# Patient Record
Sex: Female | Born: 1960 | Race: White | Hispanic: No | Smoking: Former smoker
Health system: Southern US, Community
[De-identification: ages and names within clinical notes are randomized; demographics above are authoritative.]

## PROBLEM LIST (undated history)

## (undated) DIAGNOSIS — I10 Essential (primary) hypertension: Secondary | ICD-10-CM

## (undated) DIAGNOSIS — A4902 Methicillin resistant Staphylococcus aureus infection, unspecified site: Secondary | ICD-10-CM

## (undated) DIAGNOSIS — G8929 Other chronic pain: Secondary | ICD-10-CM

## (undated) DIAGNOSIS — E785 Hyperlipidemia, unspecified: Secondary | ICD-10-CM

## (undated) DIAGNOSIS — E7800 Pure hypercholesterolemia, unspecified: Secondary | ICD-10-CM

## (undated) DIAGNOSIS — F1210 Cannabis abuse, uncomplicated: Secondary | ICD-10-CM

## (undated) DIAGNOSIS — L409 Psoriasis, unspecified: Secondary | ICD-10-CM

## (undated) DIAGNOSIS — Z720 Tobacco use: Secondary | ICD-10-CM

## (undated) DIAGNOSIS — R413 Other amnesia: Secondary | ICD-10-CM

## (undated) DIAGNOSIS — M79673 Pain in unspecified foot: Secondary | ICD-10-CM

## (undated) DIAGNOSIS — M62830 Muscle spasm of back: Secondary | ICD-10-CM

## (undated) DIAGNOSIS — M549 Dorsalgia, unspecified: Secondary | ICD-10-CM

## (undated) DIAGNOSIS — J449 Chronic obstructive pulmonary disease, unspecified: Secondary | ICD-10-CM

## (undated) DIAGNOSIS — R918 Other nonspecific abnormal finding of lung field: Secondary | ICD-10-CM

## (undated) DIAGNOSIS — E119 Type 2 diabetes mellitus without complications: Secondary | ICD-10-CM

## (undated) DIAGNOSIS — I639 Cerebral infarction, unspecified: Secondary | ICD-10-CM

## (undated) DIAGNOSIS — C801 Malignant (primary) neoplasm, unspecified: Secondary | ICD-10-CM

## (undated) HISTORY — DX: Hyperlipidemia, unspecified: E78.5

## (undated) HISTORY — PX: ABDOMINAL HYSTERECTOMY: SHX81

## (undated) HISTORY — DX: Cannabis abuse, uncomplicated: F12.10

## (undated) HISTORY — DX: Psoriasis, unspecified: L40.9

## (undated) HISTORY — DX: Pure hypercholesterolemia, unspecified: E78.00

## (undated) HISTORY — DX: Methicillin resistant Staphylococcus aureus infection, unspecified site: A49.02

## (undated) HISTORY — DX: Chronic obstructive pulmonary disease, unspecified: J44.9

## (undated) HISTORY — PX: OTHER SURGICAL HISTORY: SHX169

## (undated) HISTORY — DX: Cerebral infarction, unspecified: I63.9

## (undated) HISTORY — DX: Muscle spasm of back: M62.830

## (undated) HISTORY — DX: Type 2 diabetes mellitus without complications: E11.9

## (undated) HISTORY — PX: MASTECTOMY: SHX3

## (undated) HISTORY — DX: Tobacco use: Z72.0

---

## 2000-08-25 ENCOUNTER — Emergency Department (HOSPITAL_COMMUNITY): Admission: EM | Admit: 2000-08-25 | Discharge: 2000-08-25 | Payer: Self-pay | Admitting: Emergency Medicine

## 2000-09-22 ENCOUNTER — Emergency Department (HOSPITAL_COMMUNITY): Admission: EM | Admit: 2000-09-22 | Discharge: 2000-09-22 | Payer: Self-pay | Admitting: *Deleted

## 2000-10-21 ENCOUNTER — Emergency Department (HOSPITAL_COMMUNITY): Admission: EM | Admit: 2000-10-21 | Discharge: 2000-10-21 | Payer: Self-pay | Admitting: Emergency Medicine

## 2000-11-17 ENCOUNTER — Emergency Department (HOSPITAL_COMMUNITY): Admission: EM | Admit: 2000-11-17 | Discharge: 2000-11-17 | Payer: Self-pay | Admitting: Emergency Medicine

## 2001-07-11 ENCOUNTER — Emergency Department (HOSPITAL_COMMUNITY): Admission: EM | Admit: 2001-07-11 | Discharge: 2001-07-11 | Payer: Self-pay | Admitting: Emergency Medicine

## 2001-08-05 ENCOUNTER — Emergency Department (HOSPITAL_COMMUNITY): Admission: EM | Admit: 2001-08-05 | Discharge: 2001-08-05 | Payer: Self-pay | Admitting: Emergency Medicine

## 2001-10-22 ENCOUNTER — Emergency Department (HOSPITAL_COMMUNITY): Admission: EM | Admit: 2001-10-22 | Discharge: 2001-10-22 | Payer: Self-pay | Admitting: *Deleted

## 2001-10-22 ENCOUNTER — Emergency Department (HOSPITAL_COMMUNITY): Admission: EM | Admit: 2001-10-22 | Discharge: 2001-10-22 | Payer: Self-pay | Admitting: Emergency Medicine

## 2002-06-05 ENCOUNTER — Emergency Department (HOSPITAL_COMMUNITY): Admission: EM | Admit: 2002-06-05 | Discharge: 2002-06-05 | Payer: Self-pay | Admitting: Internal Medicine

## 2002-10-01 ENCOUNTER — Emergency Department (HOSPITAL_COMMUNITY): Admission: EM | Admit: 2002-10-01 | Discharge: 2002-10-01 | Payer: Self-pay | Admitting: Emergency Medicine

## 2002-12-17 ENCOUNTER — Emergency Department (HOSPITAL_COMMUNITY): Admission: EM | Admit: 2002-12-17 | Discharge: 2002-12-17 | Payer: Self-pay | Admitting: Emergency Medicine

## 2003-01-09 ENCOUNTER — Emergency Department (HOSPITAL_COMMUNITY): Admission: EM | Admit: 2003-01-09 | Discharge: 2003-01-09 | Payer: Self-pay | Admitting: Emergency Medicine

## 2003-07-29 ENCOUNTER — Emergency Department (HOSPITAL_COMMUNITY): Admission: EM | Admit: 2003-07-29 | Discharge: 2003-07-29 | Payer: Self-pay | Admitting: Emergency Medicine

## 2003-09-20 ENCOUNTER — Emergency Department (HOSPITAL_COMMUNITY): Admission: EM | Admit: 2003-09-20 | Discharge: 2003-09-20 | Payer: Self-pay | Admitting: *Deleted

## 2003-09-24 ENCOUNTER — Emergency Department (HOSPITAL_COMMUNITY): Admission: EM | Admit: 2003-09-24 | Discharge: 2003-09-24 | Payer: Self-pay | Admitting: *Deleted

## 2004-01-07 ENCOUNTER — Ambulatory Visit (HOSPITAL_COMMUNITY): Admission: RE | Admit: 2004-01-07 | Discharge: 2004-01-07 | Payer: Self-pay | Admitting: General Surgery

## 2004-03-02 ENCOUNTER — Inpatient Hospital Stay (HOSPITAL_COMMUNITY): Admission: RE | Admit: 2004-03-02 | Discharge: 2004-03-04 | Payer: Self-pay | Admitting: General Surgery

## 2004-03-07 ENCOUNTER — Inpatient Hospital Stay (HOSPITAL_COMMUNITY): Admission: EM | Admit: 2004-03-07 | Discharge: 2004-03-12 | Payer: Self-pay | Admitting: Emergency Medicine

## 2004-03-17 ENCOUNTER — Emergency Department (HOSPITAL_COMMUNITY): Admission: EM | Admit: 2004-03-17 | Discharge: 2004-03-17 | Payer: Self-pay | Admitting: Emergency Medicine

## 2004-05-11 ENCOUNTER — Emergency Department (HOSPITAL_COMMUNITY): Admission: EM | Admit: 2004-05-11 | Discharge: 2004-05-11 | Payer: Self-pay | Admitting: Emergency Medicine

## 2004-08-17 ENCOUNTER — Emergency Department (HOSPITAL_COMMUNITY): Admission: EM | Admit: 2004-08-17 | Discharge: 2004-08-17 | Payer: Self-pay | Admitting: *Deleted

## 2004-10-20 ENCOUNTER — Emergency Department (HOSPITAL_COMMUNITY): Admission: EM | Admit: 2004-10-20 | Discharge: 2004-10-20 | Payer: Self-pay | Admitting: Emergency Medicine

## 2005-01-20 ENCOUNTER — Emergency Department (HOSPITAL_COMMUNITY): Admission: EM | Admit: 2005-01-20 | Discharge: 2005-01-20 | Payer: Self-pay | Admitting: Emergency Medicine

## 2005-03-25 ENCOUNTER — Inpatient Hospital Stay (HOSPITAL_COMMUNITY): Admission: RE | Admit: 2005-03-25 | Discharge: 2005-03-28 | Payer: Self-pay | Admitting: General Surgery

## 2005-03-25 ENCOUNTER — Encounter (INDEPENDENT_AMBULATORY_CARE_PROVIDER_SITE_OTHER): Payer: Self-pay | Admitting: General Surgery

## 2005-04-25 ENCOUNTER — Emergency Department (HOSPITAL_COMMUNITY): Admission: EM | Admit: 2005-04-25 | Discharge: 2005-04-25 | Payer: Self-pay | Admitting: Emergency Medicine

## 2005-11-28 ENCOUNTER — Emergency Department (HOSPITAL_COMMUNITY): Admission: EM | Admit: 2005-11-28 | Discharge: 2005-11-28 | Payer: Self-pay | Admitting: Emergency Medicine

## 2006-08-23 ENCOUNTER — Emergency Department (HOSPITAL_COMMUNITY): Admission: EM | Admit: 2006-08-23 | Discharge: 2006-08-23 | Payer: Self-pay | Admitting: Emergency Medicine

## 2007-02-22 HISTORY — PX: MASTECTOMY: SHX3

## 2007-05-23 ENCOUNTER — Emergency Department (HOSPITAL_COMMUNITY): Admission: EM | Admit: 2007-05-23 | Discharge: 2007-05-23 | Payer: Self-pay | Admitting: Emergency Medicine

## 2007-06-11 ENCOUNTER — Ambulatory Visit (HOSPITAL_COMMUNITY): Admission: RE | Admit: 2007-06-11 | Discharge: 2007-06-11 | Payer: Self-pay | Admitting: Family Medicine

## 2007-06-18 ENCOUNTER — Ambulatory Visit (HOSPITAL_COMMUNITY): Admission: RE | Admit: 2007-06-18 | Discharge: 2007-06-18 | Payer: Self-pay | Admitting: Family Medicine

## 2007-06-18 ENCOUNTER — Encounter (INDEPENDENT_AMBULATORY_CARE_PROVIDER_SITE_OTHER): Payer: Self-pay | Admitting: Diagnostic Radiology

## 2007-06-28 ENCOUNTER — Ambulatory Visit (HOSPITAL_COMMUNITY): Admission: RE | Admit: 2007-06-28 | Discharge: 2007-06-28 | Payer: Self-pay | Admitting: General Surgery

## 2007-07-06 ENCOUNTER — Ambulatory Visit (HOSPITAL_COMMUNITY): Admission: RE | Admit: 2007-07-06 | Discharge: 2007-07-06 | Payer: Self-pay | Admitting: General Surgery

## 2007-07-08 ENCOUNTER — Emergency Department (HOSPITAL_COMMUNITY): Admission: EM | Admit: 2007-07-08 | Discharge: 2007-07-08 | Payer: Self-pay | Admitting: Emergency Medicine

## 2007-07-09 ENCOUNTER — Ambulatory Visit (HOSPITAL_COMMUNITY): Payer: Self-pay | Admitting: Oncology

## 2007-07-09 ENCOUNTER — Encounter (HOSPITAL_COMMUNITY): Admission: RE | Admit: 2007-07-09 | Discharge: 2007-08-08 | Payer: Self-pay | Admitting: Oncology

## 2007-07-12 ENCOUNTER — Ambulatory Visit: Payer: Self-pay | Admitting: Cardiovascular Disease

## 2007-07-12 ENCOUNTER — Encounter (HOSPITAL_COMMUNITY): Payer: Self-pay | Admitting: Oncology

## 2007-07-13 ENCOUNTER — Ambulatory Visit (HOSPITAL_COMMUNITY): Admission: RE | Admit: 2007-07-13 | Discharge: 2007-07-13 | Payer: Self-pay | Admitting: Urology

## 2007-08-20 ENCOUNTER — Encounter (HOSPITAL_COMMUNITY): Admission: RE | Admit: 2007-08-20 | Discharge: 2007-09-19 | Payer: Self-pay | Admitting: Oncology

## 2007-09-04 ENCOUNTER — Ambulatory Visit (HOSPITAL_COMMUNITY): Payer: Self-pay | Admitting: Oncology

## 2007-10-02 ENCOUNTER — Encounter (HOSPITAL_COMMUNITY): Admission: RE | Admit: 2007-10-02 | Discharge: 2007-11-01 | Payer: Self-pay | Admitting: Oncology

## 2007-10-12 ENCOUNTER — Encounter (INDEPENDENT_AMBULATORY_CARE_PROVIDER_SITE_OTHER): Payer: Self-pay | Admitting: General Surgery

## 2007-10-12 ENCOUNTER — Inpatient Hospital Stay (HOSPITAL_COMMUNITY): Admission: RE | Admit: 2007-10-12 | Discharge: 2007-10-14 | Payer: Self-pay | Admitting: General Surgery

## 2007-10-20 ENCOUNTER — Emergency Department (HOSPITAL_COMMUNITY): Admission: EM | Admit: 2007-10-20 | Discharge: 2007-10-20 | Payer: Self-pay | Admitting: Emergency Medicine

## 2007-10-23 ENCOUNTER — Ambulatory Visit (HOSPITAL_COMMUNITY): Payer: Self-pay | Admitting: Oncology

## 2007-10-23 ENCOUNTER — Inpatient Hospital Stay (HOSPITAL_COMMUNITY): Admission: AD | Admit: 2007-10-23 | Discharge: 2007-10-26 | Payer: Self-pay | Admitting: General Surgery

## 2007-11-07 ENCOUNTER — Encounter (HOSPITAL_COMMUNITY): Admission: RE | Admit: 2007-11-07 | Discharge: 2007-12-07 | Payer: Self-pay | Admitting: Oncology

## 2007-12-04 ENCOUNTER — Emergency Department (HOSPITAL_COMMUNITY): Admission: EM | Admit: 2007-12-04 | Discharge: 2007-12-04 | Payer: Self-pay | Admitting: Emergency Medicine

## 2007-12-17 ENCOUNTER — Encounter (HOSPITAL_COMMUNITY): Admission: RE | Admit: 2007-12-17 | Discharge: 2008-01-16 | Payer: Self-pay | Admitting: Oncology

## 2007-12-17 ENCOUNTER — Ambulatory Visit (HOSPITAL_COMMUNITY): Payer: Self-pay | Admitting: Oncology

## 2007-12-26 ENCOUNTER — Other Ambulatory Visit (HOSPITAL_COMMUNITY): Payer: Self-pay | Admitting: Oncology

## 2008-01-30 ENCOUNTER — Encounter (HOSPITAL_COMMUNITY): Admission: RE | Admit: 2008-01-30 | Discharge: 2008-02-29 | Payer: Self-pay | Admitting: Oncology

## 2008-02-06 ENCOUNTER — Ambulatory Visit (HOSPITAL_COMMUNITY): Payer: Self-pay | Admitting: Oncology

## 2008-03-13 ENCOUNTER — Encounter (HOSPITAL_COMMUNITY): Admission: RE | Admit: 2008-03-13 | Discharge: 2008-04-12 | Payer: Self-pay | Admitting: Oncology

## 2008-04-04 ENCOUNTER — Ambulatory Visit (HOSPITAL_COMMUNITY): Payer: Self-pay | Admitting: Oncology

## 2008-04-18 ENCOUNTER — Encounter (HOSPITAL_COMMUNITY): Admission: RE | Admit: 2008-04-18 | Discharge: 2008-05-18 | Payer: Self-pay | Admitting: Oncology

## 2008-04-21 ENCOUNTER — Ambulatory Visit: Admission: RE | Admit: 2008-04-21 | Discharge: 2008-07-20 | Payer: Self-pay | Admitting: Radiation Oncology

## 2008-05-19 ENCOUNTER — Ambulatory Visit (HOSPITAL_COMMUNITY): Admission: RE | Admit: 2008-05-19 | Discharge: 2008-05-19 | Payer: Self-pay | Admitting: Radiation Oncology

## 2008-11-13 ENCOUNTER — Ambulatory Visit (HOSPITAL_COMMUNITY): Payer: Self-pay | Admitting: Internal Medicine

## 2008-11-18 ENCOUNTER — Ambulatory Visit: Admission: RE | Admit: 2008-11-18 | Discharge: 2008-11-20 | Payer: Self-pay | Admitting: Radiation Oncology

## 2008-12-09 ENCOUNTER — Emergency Department (HOSPITAL_COMMUNITY): Admission: EM | Admit: 2008-12-09 | Discharge: 2008-12-09 | Payer: Self-pay | Admitting: Emergency Medicine

## 2009-03-13 ENCOUNTER — Ambulatory Visit (HOSPITAL_COMMUNITY): Payer: Self-pay | Admitting: Oncology

## 2009-03-13 ENCOUNTER — Encounter (HOSPITAL_COMMUNITY): Admission: RE | Admit: 2009-03-13 | Discharge: 2009-04-12 | Payer: Self-pay | Admitting: Oncology

## 2009-04-25 ENCOUNTER — Emergency Department (HOSPITAL_COMMUNITY): Admission: EM | Admit: 2009-04-25 | Discharge: 2009-04-25 | Payer: Self-pay | Admitting: Emergency Medicine

## 2009-05-13 ENCOUNTER — Ambulatory Visit: Payer: Self-pay | Admitting: Physician Assistant

## 2009-05-13 DIAGNOSIS — F411 Generalized anxiety disorder: Secondary | ICD-10-CM | POA: Insufficient documentation

## 2009-05-13 DIAGNOSIS — F329 Major depressive disorder, single episode, unspecified: Secondary | ICD-10-CM

## 2009-05-13 DIAGNOSIS — I10 Essential (primary) hypertension: Secondary | ICD-10-CM | POA: Insufficient documentation

## 2009-05-13 DIAGNOSIS — B353 Tinea pedis: Secondary | ICD-10-CM

## 2009-05-13 DIAGNOSIS — C50919 Malignant neoplasm of unspecified site of unspecified female breast: Secondary | ICD-10-CM | POA: Insufficient documentation

## 2009-05-13 LAB — CONVERTED CEMR LAB
ALT: 20 units/L (ref 0–35)
AST: 19 units/L (ref 0–37)
Albumin: 4.5 g/dL (ref 3.5–5.2)
BUN: 12 mg/dL (ref 6–23)
Barbiturate Quant, Ur: NEGATIVE
Benzodiazepines.: POSITIVE — AB
Calcium: 10 mg/dL (ref 8.4–10.5)
Chloride: 104 meq/L (ref 96–112)
Eosinophils Relative: 2 % (ref 0–5)
HCT: 43.8 % (ref 36.0–46.0)
Hemoglobin: 14.3 g/dL (ref 12.0–15.0)
Lymphocytes Relative: 36 % (ref 12–46)
Lymphs Abs: 2.1 10*3/uL (ref 0.7–4.0)
Marijuana Metabolite: NEGATIVE
Methadone: NEGATIVE
Monocytes Absolute: 0.4 10*3/uL (ref 0.1–1.0)
Opiate Screen, Urine: NEGATIVE
Platelets: 280 10*3/uL (ref 150–400)
Potassium: 4.4 meq/L (ref 3.5–5.3)
Propoxyphene: NEGATIVE
Sodium: 142 meq/L (ref 135–145)
Total Protein: 7.7 g/dL (ref 6.0–8.3)
WBC: 5.9 10*3/uL (ref 4.0–10.5)

## 2009-05-14 ENCOUNTER — Encounter: Payer: Self-pay | Admitting: Physician Assistant

## 2009-09-18 ENCOUNTER — Ambulatory Visit (HOSPITAL_COMMUNITY): Payer: Self-pay | Admitting: Oncology

## 2009-11-04 ENCOUNTER — Encounter (HOSPITAL_COMMUNITY)
Admission: RE | Admit: 2009-11-04 | Discharge: 2009-12-04 | Payer: Self-pay | Source: Home / Self Care | Admitting: Oncology

## 2010-02-25 ENCOUNTER — Ambulatory Visit: Admit: 2010-02-25 | Payer: Self-pay | Admitting: Nurse Practitioner

## 2010-03-13 ENCOUNTER — Encounter: Payer: Self-pay | Admitting: General Surgery

## 2010-03-14 ENCOUNTER — Encounter: Payer: Self-pay | Admitting: General Surgery

## 2010-03-15 ENCOUNTER — Encounter: Payer: Self-pay | Admitting: Radiation Oncology

## 2010-03-19 ENCOUNTER — Encounter (HOSPITAL_COMMUNITY)
Admission: RE | Admit: 2010-03-19 | Discharge: 2010-03-23 | Payer: Self-pay | Source: Home / Self Care | Attending: Oncology | Admitting: Oncology

## 2010-03-19 ENCOUNTER — Ambulatory Visit (HOSPITAL_COMMUNITY)
Admission: RE | Admit: 2010-03-19 | Discharge: 2010-03-23 | Payer: Self-pay | Source: Home / Self Care | Attending: Oncology | Admitting: Oncology

## 2010-03-19 LAB — COMPREHENSIVE METABOLIC PANEL
ALT: 20 U/L (ref 0–35)
AST: 19 U/L (ref 0–37)
Albumin: 3.8 g/dL (ref 3.5–5.2)
Alkaline Phosphatase: 78 U/L (ref 39–117)
BUN: 8 mg/dL (ref 6–23)
CO2: 29 mEq/L (ref 19–32)
Calcium: 9.6 mg/dL (ref 8.4–10.5)
Chloride: 98 mEq/L (ref 96–112)
Creatinine, Ser: 0.73 mg/dL (ref 0.4–1.2)
GFR calc Af Amer: >60 mL/min (ref >60)
GFR calc non Af Amer: >60 mL/min (ref >60)
Glucose, Bld: 134 mg/dL — ABNORMAL HIGH (ref 70–99)
Potassium: 3.4 mEq/L — ABNORMAL LOW (ref 3.5–5.1)
Sodium: 137 mEq/L (ref 135–145)
Total Bilirubin: 0.2 mg/dL — ABNORMAL LOW (ref 0.3–1.2)
Total Protein: 7.6 g/dL (ref 6.0–8.3)

## 2010-03-19 LAB — CBC
MCH: 32.6 pg (ref 26.0–34.0)
MCV: 94 fL (ref 78.0–100.0)
Platelets: 197 10*3/uL (ref 150–400)
RDW: 13.9 % (ref 11.5–15.5)

## 2010-03-19 LAB — DIFFERENTIAL
Basophils Absolute: 0 10*3/uL (ref 0.0–0.1)
Basophils Relative: 0 % (ref 0–1)
Eosinophils Absolute: 0.1 10*3/uL (ref 0.0–0.7)
Eosinophils Relative: 1 % (ref 0–5)
Lymphocytes Relative: 23 % (ref 12–46)
Lymphs Abs: 1.9 10*3/uL (ref 0.7–4.0)
Monocytes Absolute: 0.6 10*3/uL (ref 0.1–1.0)
Monocytes Relative: 7 % (ref 3–12)
Neutro Abs: 5.8 10*3/uL (ref 1.7–7.7)
Neutrophils Relative %: 69 % (ref 43–77)

## 2010-03-19 LAB — HEMOGLOBIN A1C
Hgb A1c MFr Bld: 6 % — ABNORMAL HIGH (ref <5.7)
Mean Plasma Glucose: 126 mg/dL — ABNORMAL HIGH (ref <117)

## 2010-03-23 NOTE — Letter (Signed)
Summary: PT INFORMATION SHEET  PT INFORMATION SHEET   Imported By: Arta Bruce 07/23/2009 12:45:48  _____________________________________________________________________  External Attachment:    Type:   Image     Comment:   External Document

## 2010-03-23 NOTE — Letter (Signed)
Summary: *HSN Results Follow up  [facility]  [address] [place], [place] [address]   Phone: [phone]  Fax: [phone]      05/14/2009   Elnita Maxwell Parlato [address] [place], [place]  [address]   Dear  Ms. Delbra Abdelrahman,                            ____S.Drinkard,FNP   ____D. Gore,FNP       ____B. McPherson,MD   ____V. Rankins,MD    ____E. Mulberry,MD    ____N. Daphine Deutscher, FNP  ____D. Reche Dixon, MD    ____K. Philipp Deputy, MD    __x__S. Alben Spittle, PA-C     This letter is to inform you that your recent test(s):  _______Pap Smear    ___x____Lab Test     _______X-ray    ___x____ is within acceptable limits  _______ requires a medication change  _______ requires a follow-up lab visit  _______ requires a follow-up visit with your provider   Comments:       _________________________________________________________ If you have any questions, please contact our office                     Sincerely,  Tereso Newcomer PA-C [facility]

## 2010-03-23 NOTE — Assessment & Plan Note (Signed)
Summary: NEW/CANCER PT/HTN/HEADACHES/BACK//KT   Vital Signs:  Patient profile:   50 year old female Height:      65 inches Weight:      211 pounds BMI:     35.24 Temp:     98.3 degrees F oral Pulse rate:   84 / minute Pulse rhythm:   regular Resp:     18 per minute BP sitting:   126 / 82  (left arm)  Vitals Entered By: [place] Shannon (May 13, 2009 11:41 AM) CC: NP...Marland Kitchen pt says she woul like a pain for the pain she has in her left side.. Is Patient Diabetic? No Pain Assessment Patient in pain? no       Does patient need assistance? Functional Status Self care Ambulation Normal   Primary Care Provider:  Tereso Newcomer, PA-C  CC:  NP...Marland Kitchen pt says she woul like a pain for the pain she has in her left side...  History of Present Illness: New patient.  Previously seeing Dr. Katrinka Blazing in [place]. Has a h/o left breast cancer.  She is s/p bilat mastectomies and has had chemo and radiation Tx. Has been following with Dr. Glenford Peers in [place] and Ms. Mitzi Hansen, NP at [facility] at [facility]. Has been getting BP meds with Dr. Mariel Sleet. Notes left sided pain at site of mastectomy.  Has had chronic pain there and swelling.  She says no one is treating the pain.  She is not seeing the surgeon anymore.  She says that the NP at the cancer center rec. she go to PT but she declined b/c of having to care for her grandchildren.  Social: Raising 2 grandkids on her own. Son just convicted to serve 20 years in prison. Used to take antidepressants but stopped due to side effects. Takes vistaril for itching and at bedtime.  Went to ED recently and was dx with UTI.  Taking cipro.  Uses nystatin cream for tinea between toes on feet.  Chronic problem since 2005.  No relief.   Habits & Providers  Alcohol-Tobacco-Diet     Alcohol drinks/day: 0     Tobacco Status: quit     Year Quit: 2009     Pack years: 62  Exercise-Depression-Behavior     Have you felt down or hopeless? no     Drug  Use: no  Current Medications (verified): 1)  Lisinopril-Hydrochlorothiazide 10-12.5 Mg Tabs (Lisinopril-Hydrochlorothiazide) .... One Tab By Mouth Once Daily 2)  Hydroxyzine Pamoate 25 Mg Caps (Hydroxyzine Pamoate) .... One Cap At Bedtime 3)  Cipro 500 Mg Tabs (Ciprofloxacin Hcl) .... One Tab By Mouth Twice Daily 4)  Nystatin 100000 Unit/gm Oint (Nystatin) .... Use Daily  Allergies (verified): 1)  ! Codeine 2)  ! Pcn  Past History:  Past Medical History: Breast cancer, hx of (Stage 3)   a.  bilat mastectomies in 2009 (Left breast tumor - radical; simple on right)   b.  completed chemo 2010 and radiation Tx 3 mos ago   c.  [facility] and Dr. Mariel Sleet in [place] Hypertension Migraine Headaches Depression Anxiety  Past Surgical History: s/p Left radical mastectomy 09/2007 s/p simple Right mastectomy 09/2007 s/p hemorrhoidectomy 2007 Hysterectomy (TAH in 2005)  Family History: Mom - stroke Family History Lung cancer - dad Family History Diabetes 1st degree relative - sister  Social History: Disabled Separated 4 living kids; 1 deceased Raising Grandkids Former Smoker Alcohol use-no Drug use-no Smoking Status:  quit Drug Use:  no  Review of Systems  See HPI General:  Denies chills and fever. CV:  Denies chest pain or discomfort. Resp:  Denies cough. GI:  Denies bloody stools and dark tarry stools. GU:  Denies hematuria. Psych:  Denies suicidal thoughts/plans.  Physical Exam  General:  alert, well-developed, and well-nourished.   Head:  normocephalic and atraumatic.   Neck:  supple.   Chest Wall:  s/p bilat mastectomy + tenderness noted over scar on left  mild to mod amount of edema no axillary LAD on left no erythema or discharge Lungs:  normal breath sounds, no crackles, and no wheezes.   Heart:  normal rate and regular rhythm.   Abdomen:  soft.   Extremities:  no edema Skin:  scaling and cracking betweeen bilat 4th and 5th toes c/w  tinea Psych:  flat affect and tearful.     Impression & Recommendations:  Problem # 1:  BREAST CANCER, HX OF (ICD-V10.3)  has chronic chest pain post mastectomy will retrieve records  rec tylenol or motrin for now  she will continue f/u with Dr. Mariel Sleet and Ms. Moody  Orders: T-CBC w/Diff [phone])  Problem # 2:  HYPERTENSION (ICD-401.9)  controlled check labs  Her updated medication list for this problem includes:    Lisinopril-hydrochlorothiazide 10-12.5 Mg Tabs (Lisinopril-hydrochlorothiazide) ..... One tab by mouth once daily  Orders: T-Comprehensive Metabolic Panel ([id number])  Problem # 3:  TINEA PEDIS (ICD-110.4)  change nystatin to lamisil explained to her to use socks with shoes and to try to keep feet dry  Her updated medication list for this problem includes:    Lamisil At Athletes Foot 1 % Crea (Terbinafine hcl) .Marland Kitchen... Apply two times a day to toes for 2 weeks  Problem # 4:  DEPRESSION (ICD-311)  PHQ9=0 today not sure if she understood questionnaire denies suicidal ideations refuses to go back on antidepressants will refer to counselor for now  Her updated medication list for this problem includes:    Hydroxyzine Pamoate 25 Mg Caps (Hydroxyzine pamoate) ..... One cap at bedtime  Orders: Psychology Referral (Psychology)  Problem # 5:  PREVENTIVE HEALTH CARE (ICD-V70.0)  no pap since TAH in 2005 will eventually need to schedule CPP check labs for routine health maint for new patient  Orders: T-Comprehensive Metabolic Panel [phone]) T-CBC w/Diff [phone]) T-Drug Screen-Urine, (single) ([id number]) T-HIV Antibody  (Reflex) ([id number]) T-Syphilis Test (RPR) ([id number]) T-TSH ([id number])  Complete Medication List: 1)  Lisinopril-hydrochlorothiazide 10-12.5 Mg Tabs (Lisinopril-hydrochlorothiazide) .... One tab by mouth once daily 2)  Hydroxyzine Pamoate 25 Mg Caps (Hydroxyzine pamoate) .... One cap at bedtime 3)  Cipro 500  Mg Tabs (Ciprofloxacin hcl) .... One tab by mouth twice daily 4)  Lamisil At Athletes Foot 1 % Crea (Terbinafine hcl) .... Apply two times a day to toes for 2 weeks  Patient Instructions: 1)  Get records from Dr. Michaelle Copas office and  Dr. Glenford Peers in [place] and Ms. Moody in [place]. 2)  Take 650 - 1000 mg of tylenol every 4-6 hours as needed for relief of pain or comfort of fever. Avoid taking more than 4000 mg in a 24 hour period( can cause liver damage in higher doses).  3)  Take 400-600 mg of Ibuprofen (Advil, Motrin) with food every 4-6 hours as needed  for relief of pain or comfort of fever.  4)  Schedule appointment with Ethelene Browns. 5)  Please schedule a follow-up appointment in 2 months with Nalaysia Manganiello for follow up. 6)  Have all your doctors send me notes when they see  you. 7)  Call our office when you need refills on your medications.  Prescriptions: LAMISIL AT ATHLETES FOOT 1 % CREA (TERBINAFINE HCL) apply two times a day to toes for 2 weeks  #30 grams x 1   Entered and Authorized by:   Tereso Newcomer PA-C   Signed by:   Tereso Newcomer PA-C on 05/13/2009   Method used:   Print then Give to Patient   RxID:   [id number]

## 2010-03-31 ENCOUNTER — Encounter (HOSPITAL_COMMUNITY): Payer: Self-pay

## 2010-04-07 ENCOUNTER — Ambulatory Visit (HOSPITAL_COMMUNITY): Payer: Medicaid Other | Admitting: Oncology

## 2010-04-07 ENCOUNTER — Encounter (HOSPITAL_COMMUNITY): Payer: Self-pay

## 2010-04-23 ENCOUNTER — Ambulatory Visit (HOSPITAL_COMMUNITY): Payer: Self-pay | Admitting: Oncology

## 2010-05-06 ENCOUNTER — Ambulatory Visit (HOSPITAL_COMMUNITY): Payer: Medicaid Other | Admitting: Oncology

## 2010-05-06 DIAGNOSIS — C50919 Malignant neoplasm of unspecified site of unspecified female breast: Secondary | ICD-10-CM

## 2010-05-09 LAB — DIFFERENTIAL
Basophils Relative: 1 % (ref 0–1)
Eosinophils Absolute: 0.1 10*3/uL (ref 0.0–0.7)
Lymphs Abs: 1.8 10*3/uL (ref 0.7–4.0)
Monocytes Absolute: 0.4 10*3/uL (ref 0.1–1.0)
Monocytes Relative: 6 % (ref 3–12)

## 2010-05-09 LAB — CBC
MCHC: 33.5 g/dL (ref 30.0–36.0)
MCV: 96.2 fL (ref 78.0–100.0)
Platelets: 275 10*3/uL (ref 150–400)
RDW: 14.1 % (ref 11.5–15.5)

## 2010-05-09 LAB — COMPREHENSIVE METABOLIC PANEL
ALT: 21 U/L (ref 0–35)
AST: 19 U/L (ref 0–37)
AST: 19 U/L (ref 0–37)
Albumin: 4.7 g/dL (ref 3.5–5.2)
Alkaline Phosphatase: 86 U/L (ref 39–117)
BUN: 12 mg/dL (ref 6–23)
CO2: 23 mEq/L (ref 19–32)
Calcium: 10.2 mg/dL (ref 8.4–10.5)
Chloride: 104 mEq/L (ref 96–112)
Creatinine, Ser: 0.77 mg/dL (ref 0.40–1.20)
Creatinine, Ser: 0.77 mg/dL (ref 0.4–1.2)
GFR calc Af Amer: >60 mL/min (ref >60)
GFR calc non Af Amer: >60 mL/min (ref >60)
Potassium: 4.2 mEq/L (ref 3.5–5.1)
Sodium: 138 mEq/L (ref 135–145)
Total Bilirubin: 0.2 mg/dL — ABNORMAL LOW (ref 0.3–1.2)
Total Protein: 7.9 g/dL (ref 6.0–8.3)

## 2010-05-09 LAB — CEA: CEA: 10.4 ng/mL — ABNORMAL HIGH (ref 0.0–5.0)

## 2010-05-09 LAB — HEMOGLOBIN A1C: Mean Plasma Glucose: 120 mg/dL

## 2010-05-16 LAB — URINALYSIS, ROUTINE W REFLEX MICROSCOPIC
Glucose, UA: NEGATIVE mg/dL
Ketones, ur: NEGATIVE mg/dL
Leukocytes, UA: NEGATIVE
Nitrite: NEGATIVE
Specific Gravity, Urine: >1.03 — ABNORMAL HIGH (ref 1.005–1.030)
pH: 5.5 (ref 5.0–8.0)

## 2010-05-16 LAB — CBC
HCT: 39.9 % (ref 36.0–46.0)
Platelets: 194 10*3/uL (ref 150–400)
RDW: 14.4 % (ref 11.5–15.5)
WBC: 10.1 10*3/uL (ref 4.0–10.5)

## 2010-05-16 LAB — DIFFERENTIAL
Basophils Absolute: 0 10*3/uL (ref 0.0–0.1)
Eosinophils Relative: 1 % (ref 0–5)
Lymphocytes Relative: 16 % (ref 12–46)
Lymphs Abs: 1.6 10*3/uL (ref 0.7–4.0)
Neutro Abs: 7.8 10*3/uL — ABNORMAL HIGH (ref 1.7–7.7)
Neutrophils Relative %: 77 % (ref 43–77)

## 2010-05-16 LAB — GC/CHLAMYDIA PROBE AMP, GENITAL: Chlamydia, DNA Probe: NEGATIVE

## 2010-05-16 LAB — WET PREP, GENITAL: Clue Cells Wet Prep HPF POC: NONE SEEN

## 2010-05-16 LAB — URINE MICROSCOPIC-ADD ON

## 2010-05-16 LAB — BASIC METABOLIC PANEL
BUN: 5 mg/dL — ABNORMAL LOW (ref 6–23)
Calcium: 9.1 mg/dL (ref 8.4–10.5)
GFR calc non Af Amer: >60 mL/min (ref >60)
Glucose, Bld: 98 mg/dL (ref 70–99)
Potassium: 3.9 mEq/L (ref 3.5–5.1)

## 2010-05-27 LAB — BASIC METABOLIC PANEL
Calcium: 10.2 mg/dL (ref 8.4–10.5)
Chloride: 107 mEq/L (ref 96–112)
Creatinine, Ser: 0.7 mg/dL (ref 0.4–1.2)
GFR calc Af Amer: >60 mL/min (ref >60)
Sodium: 141 mEq/L (ref 135–145)

## 2010-05-27 LAB — CK TOTAL AND CKMB (NOT AT ARMC)
CK, MB: 1 ng/mL (ref 0.3–4.0)
Relative Index: INVALID (ref 0.0–2.5)
Total CK: 48 U/L (ref 7–177)

## 2010-06-01 ENCOUNTER — Encounter (HOSPITAL_COMMUNITY): Payer: Medicaid Other | Attending: Oncology | Admitting: Oncology

## 2010-06-01 DIAGNOSIS — Z09 Encounter for follow-up examination after completed treatment for conditions other than malignant neoplasm: Secondary | ICD-10-CM | POA: Insufficient documentation

## 2010-06-01 DIAGNOSIS — I10 Essential (primary) hypertension: Secondary | ICD-10-CM | POA: Insufficient documentation

## 2010-06-01 DIAGNOSIS — Z79899 Other long term (current) drug therapy: Secondary | ICD-10-CM | POA: Insufficient documentation

## 2010-06-01 DIAGNOSIS — Z853 Personal history of malignant neoplasm of breast: Secondary | ICD-10-CM | POA: Insufficient documentation

## 2010-06-01 DIAGNOSIS — C50919 Malignant neoplasm of unspecified site of unspecified female breast: Secondary | ICD-10-CM

## 2010-06-03 LAB — URINE MICROSCOPIC-ADD ON

## 2010-06-03 LAB — URINALYSIS, ROUTINE W REFLEX MICROSCOPIC
Glucose, UA: NEGATIVE mg/dL
Ketones, ur: NEGATIVE mg/dL
Protein, ur: 30 mg/dL — AB
Urobilinogen, UA: 0.2 mg/dL (ref 0.0–1.0)

## 2010-06-07 LAB — DIFFERENTIAL
Basophils Absolute: 0 10*3/uL (ref 0.0–0.1)
Basophils Relative: 1 % (ref 0–1)
Basophils Relative: 0 % (ref 0–1)
Eosinophils Absolute: 0.1 10*3/uL (ref 0.0–0.7)
Eosinophils Absolute: 0.1 10*3/uL (ref 0.0–0.7)
Eosinophils Relative: 2 % (ref 0–5)
Eosinophils Relative: 1 % (ref 0–5)
Monocytes Absolute: 0.5 10*3/uL (ref 0.1–1.0)
Monocytes Relative: 7 % (ref 3–12)
Monocytes Relative: 7 % (ref 3–12)
Neutrophils Relative %: 66 % (ref 43–77)

## 2010-06-07 LAB — CBC
HCT: 40.3 % (ref 36.0–46.0)
HCT: 39.9 % (ref 36.0–46.0)
Hemoglobin: 13.6 g/dL (ref 12.0–15.0)
Hemoglobin: 13.4 g/dL (ref 12.0–15.0)
MCHC: 33.7 g/dL (ref 30.0–36.0)
MCV: 95.9 fL (ref 78.0–100.0)
RBC: 4.16 MIL/uL (ref 3.87–5.11)
RDW: 15.2 % (ref 11.5–15.5)
WBC: 5.2 10*3/uL (ref 4.0–10.5)

## 2010-06-07 LAB — COMPREHENSIVE METABOLIC PANEL
ALT: 19 U/L (ref 0–35)
AST: 18 U/L (ref 0–37)
Alkaline Phosphatase: 80 U/L (ref 39–117)
CO2: 27 mEq/L (ref 19–32)
GFR calc Af Amer: >60 mL/min (ref >60)
Glucose, Bld: 138 mg/dL — ABNORMAL HIGH (ref 70–99)
Potassium: 4.1 mEq/L (ref 3.5–5.1)
Sodium: 136 mEq/L (ref 135–145)
Total Protein: 7.1 g/dL (ref 6.0–8.3)

## 2010-06-07 LAB — URINALYSIS, ROUTINE W REFLEX MICROSCOPIC
Bilirubin Urine: NEGATIVE
Glucose, UA: NEGATIVE mg/dL
Ketones, ur: NEGATIVE mg/dL
Protein, ur: NEGATIVE mg/dL
Urobilinogen, UA: 0.2 mg/dL (ref 0.0–1.0)

## 2010-06-07 LAB — URINE MICROSCOPIC-ADD ON

## 2010-06-08 LAB — CBC
Hemoglobin: 13 g/dL (ref 12.0–15.0)
MCHC: 34.4 g/dL (ref 30.0–36.0)
MCHC: 33.5 g/dL (ref 30.0–36.0)
MCHC: 34.3 g/dL (ref 30.0–36.0)
MCV: 96.5 fL (ref 78.0–100.0)
Platelets: 234 10*3/uL (ref 150–400)
Platelets: 216 10*3/uL (ref 150–400)
RBC: 3.92 MIL/uL (ref 3.87–5.11)
RDW: 15.4 % (ref 11.5–15.5)
RDW: 14.6 % (ref 11.5–15.5)
RDW: 15 % (ref 11.5–15.5)

## 2010-06-08 LAB — COMPREHENSIVE METABOLIC PANEL
ALT: 17 U/L (ref 0–35)
Albumin: 3.6 g/dL (ref 3.5–5.2)
Alkaline Phosphatase: 94 U/L (ref 39–117)
BUN: 9 mg/dL (ref 6–23)
Calcium: 9.7 mg/dL (ref 8.4–10.5)
Glucose, Bld: 115 mg/dL — ABNORMAL HIGH (ref 70–99)
Potassium: 3.7 mEq/L (ref 3.5–5.1)
Sodium: 138 mEq/L (ref 135–145)
Total Protein: 6.8 g/dL (ref 6.0–8.3)

## 2010-06-08 LAB — DIFFERENTIAL
Basophils Absolute: 0 10*3/uL (ref 0.0–0.1)
Basophils Absolute: 0.1 10*3/uL (ref 0.0–0.1)
Basophils Relative: 0 % (ref 0–1)
Basophils Relative: 1 % (ref 0–1)
Basophils Relative: 1 % (ref 0–1)
Eosinophils Absolute: 0.1 10*3/uL (ref 0.0–0.7)
Eosinophils Relative: 2 % (ref 0–5)
Lymphocytes Relative: 26 % (ref 12–46)
Lymphs Abs: 1.6 10*3/uL (ref 0.7–4.0)
Monocytes Absolute: 0.4 10*3/uL (ref 0.1–1.0)
Monocytes Absolute: 0.2 10*3/uL (ref 0.1–1.0)
Monocytes Absolute: 0.3 10*3/uL (ref 0.1–1.0)
Monocytes Relative: 6 % (ref 3–12)
Monocytes Relative: 5 % (ref 3–12)
Neutro Abs: 4.3 10*3/uL (ref 1.7–7.7)
Neutro Abs: 3.3 10*3/uL (ref 1.7–7.7)
Neutrophils Relative %: 68 % (ref 43–77)
Neutrophils Relative %: 66 % (ref 43–77)

## 2010-07-06 NOTE — Discharge Summary (Signed)
NAMEJIMMI, [name] NO.:  [id number]   MEDICAL RECORD NO.:  [id number]          PATIENT TYPE:  INP   LOCATION:  A304                          FACILITY:  [facility]   PHYSICIAN:  Dalia Heading, M.D.  DATE OF BIRTH:  02-Jan-1961   DATE OF ADMISSION:  10/12/2007  DATE OF DISCHARGE:  08/23/2009LH                               DISCHARGE SUMMARY   HOSPITAL COURSE SUMMARY:  The patient is a 50 year old white female who  received neoadjuvant chemotherapy for stage III left breast carcinoma,  who presented for surgery.  At her request and given her minimal  response to neoadjuvant chemotherapy, the patient underwent a left  modified radical mastectomy and right simple mastectomy on October 12, 2007.  She tolerated the procedure well.  Postoperative course was, for  the most part, unremarkable.  Her diet was advanced without difficulty  postoperatively.  She was discharged home on October 14, 2007, in fair  and stable condition.  Her flap drains were removed prior to discharge.  She did receive 2 units packed red blood cells due to anemia that was  secondary to chronic disease as well as surgery.   DISCHARGE MEDICATIONS:  1. Darvocet-N 100 as needed for pain.  2. Phenergan suppositories.  3. Potassium chloride 10 mEq p.o. daily.  4. Fluconazole 100 mg p.o. p.r.n.  5. Lorazepam 1 mg p.o. p.r.n.  6. __________ 2.5 mg p.o. p.r.n.  7. Sulfamethoxazole 1 tablet p.o. p.r.n.  8. Alprazolam 1 mg p.o. p.r.n.  9. Duke's mixture swish and swallow p.r.n.  10.Tobramycin ophthalmic p.r.n.  11.Lisinopril/hydrochlorothiazide 1 tablet p.o. daily   FOLLOWUP:  The patient is to follow up with Dr. Franky Macho on October 18, 2007.   PRINCIPAL DIAGNOSES:  1. Stage III left breast carcinoma.  2. Hypertension.  3. Chronic obstructive pulmonary disease.   PRINCIPAL PROCEDURE:  Left modified radical mastectomy, right simple  mastectomy on October 12, 2007.      Dalia Heading, M.D.  Electronically Signed     MAJ/MEDQ  D:  11/09/2007  T:  11/09/2007  Job:  [id number]   cc:   Ladona Horns. Mariel Sleet, MD  Fax: [phone]

## 2010-07-06 NOTE — Op Note (Signed)
[name], [name]                 ACCOUNT NO.:  [id number]   MEDICAL RECORD NO.:  [id number]          PATIENT TYPE:  INP   LOCATION:  A304                          FACILITY:  [facility]   PHYSICIAN:  Dalia Heading, M.D.  DATE OF BIRTH:  June 10, 1960   DATE OF PROCEDURE:  10/12/2007  DATE OF DISCHARGE:                               OPERATIVE REPORT   PREOPERATIVE DIAGNOSIS:  Left breast carcinoma.   POSTOPERATIVE DIAGNOSIS:  Left breast carcinoma.   PROCEDURES:  1. Left modified radical mastectomy.  2. Right simple mastectomy.   SURGEON:  Dalia Heading, MD   ANESTHESIA:  General endotracheal.   INDICATIONS:  The patient is a 51 year old white female who has received  neoadjuvant chemotherapy for stage III left breast carcinoma who now  presents for surgery.  Given her minimal response and her concern for  further breast cancer on the right side, she desires a left modified  radical mastectomy and a right simple mastectomy.  Risks and benefits of  the procedures including bleeding, infection, nerve injury, possibly of  arm swelling as well as the possibility of blood transfusion were fully  explained to the patient, gave informed consent.   PROCEDURE NOTE:  The patient was placed in a supine position.  After  induction of general endotracheal anesthesia, the breasts were prepped  and draped in the usual sterile technique with Betadine.  Surgical site  confirmation was performed.   She first underwent a left modified radical mastectomy.  An elliptical  incision was made medial to lateral around the left areola.  The  superior flap was then formed to the clavicle and inferior flap formed  to the chest wall.  The left breast was then removed from the pectoralis  major muscle along with the fascial attachments using Bovie  electrocautery.  A short suture was placed superiorly and a long suture  placed laterally for orientation purposes.  The specimen was then sent  to pathology for  further examination.  A level II left axillary  dissection was then performed.  The patient was noted have multiple  enlarged, matted lymph nodes.  Care was taken to avoid the long thoracic  nerve as well as the thoracodorsal artery vein and nerve complexes.  The  left axillary contents were then sent to pathology for further  examination.  Any bleeding was controlled using clips or Bovie  electrocautery.  Two drains were placed in the dissection area.  The  superior drain was placed underneath the breast flap and an inferior  drain was placed into the left axilla.  Both were secured to skin level  using 3-0 Prolene interrupted sutures.  The subcutaneous layer was  reapproximated using 2-0 Vicryl interrupted sutures.  The skin was  closed using staples.  Betadine ointment and dry sterile dressing were  applied.   Next, a right simple mastectomy was performed.  An elliptical incision  was made medial to lateral around the areola.  A superior flap was then  formed to the clavicle and inferior flap formed to the chest wall.  The  breast  was then removed along with its fascial attachments from the  pectoralis major muscle medial to lateral using Bovie electrocautery.  A  short suture was placed superiorly and a long suture placed laterally  for orientation purposes.  The specimen was sent to pathology for  further examination.  Any bleeding was controlled using Bovie  electrocautery.  The wound was irrigated with normal saline.  Subcutaneous layer was reapproximated using a 2-0 Vicryl interrupted  suture.  The skin was closed using staples.  Betadine ointment and dry  sterile dressings were applied.   All tape and needle counts were correct at the end of the procedures.  The patient was extubated in the operating room. went back to recovery  room awake and stable condition.   COMPLICATIONS:  None.   SPECIMEN:  Left breast and axillary contents, right breast.   ESTIMATED BLOOD LOSS:   Less than 100 mL.   DRAINS:  Left superior flap drain, left inferior axillary drain, and  right flap drain.      Dalia Heading, M.D.  Electronically Signed     MAJ/MEDQ  D:  10/12/2007  T:  10/13/2007  Job:  [id number]   cc:   Ladona Horns. Mariel Sleet, MD  Fax: [phone]

## 2010-07-06 NOTE — H&P (Signed)
NAME:  [name], [name] NO.:  [id number]   MEDICAL RECORD NO.:  [id number]          PATIENT TYPE:  INP   LOCATION:  A301                          FACILITY:  [facility]   PHYSICIAN:  Dalia Heading, M.D.  DATE OF BIRTH:  07/08/1960   DATE OF ADMISSION:  10/23/2007  DATE OF DISCHARGE:  LH                              HISTORY & PHYSICAL   CHIEF COMPLAINT:  Cellulitis, left breast.   HISTORY OF PRESENT ILLNESS:  The patient is a 50 year old white female  with a stage III left breast carcinoma, status post left modified  radical mastectomy, and right simple mastectomy on October 12, 2007, who  presented to the hospital today to see Dr. Mariel Sleet of oncology for  followup treatment.  She was found to have significant erythema and  induration in the left breast incision site.  She states that she went  to the emergency room 2 days ago with fevers and chills.  She was  treated in the emergency room and released.  This was known to me.  Her  white blood cell count at that time was 14.9.  She was started on  Vibramycin yesterday, but she did not get the medication as prescribed.  She is being admitted to the hospital for intravenous vancomycin  therapy.   PAST MEDICAL HISTORY:  COPD and hypertension.   PAST SURGICAL HISTORY:  As noted above, Port-A-Cath placement in May  2009, and hysterectomy.   CURRENT MEDICATIONS:  Lisinopril/hydrochlorothiazide and albuterol  inhaler.   ALLERGIES:  PENICILLIN and CODEINE.   REVIEW OF SYSTEMS:  The patient smokes cigarettes daily.  She drinks  alcohol socially.  She denies any other cardiopulmonary difficulties or  bleeding disorders.   PHYSICAL EXAMINATION:  GENERAL:  The patient is a well-developed, well-  nourished white female who appears older than her stated age.  She is in  no acute distress.  LUNGS:  Clear to auscultation with equal breath sounds bilaterally.  HEART:  Regular rate and rhythm without S3, S4, or murmurs.  BREAST:  Right breast examination reveals a surgical incision, which is  intact.  Mild erythema is noted.  No abscess cavity is identified.  Left  breast examination reveals a significantly indurated and erythematous  incision line.  The Jackson-Pratt drain is in place with cloudy yellow  of fluid with debris present.  No drainage is noted from the wound.  There is no focal abscess cavity to be identified.  No purulent drainage  is noted.   IMPRESSION:  Cellulitis, left mastectomy site, history of methicillin-  resistant Staphylococcus aureus infection as per Dr. Mariel Sleet.   PLAN:  The patient will be admitted to hospital for pain control and IV  vancomycin therapy. A CBC and MET-7 as well as a blood culture through  her Port-A-Cath have been ordered.  Further managements, pending those  results.      Dalia Heading, M.D.  Electronically Signed     MAJ/MEDQ  D:  10/23/2007  T:  10/24/2007  Job:  [id number]   cc:   Ladona Horns. Mariel Sleet, MD  Fax: [phone]

## 2010-07-06 NOTE — Discharge Summary (Signed)
NAMECAMILIA, [name] NO.:  [id number]   MEDICAL RECORD NO.:  [id number]          PATIENT TYPE:  INP   LOCATION:  A301                          FACILITY:  [facility]   PHYSICIAN:  Dalia Heading, M.D.  DATE OF BIRTH:  09-21-1960   DATE OF ADMISSION:  10/23/2007  DATE OF DISCHARGE:  09/04/2009LH                               DISCHARGE SUMMARY   HOSPITAL COURSE SUMMARY:  The patient is a 50 year old white female with  a stage III left breast carcinoma, status post left modified radical  mastectomy and right simple mastectomy on October 12, 2007, who presented  to oncology clinic today with the cellulitis bilaterally.  She has a  history of MRSA infection in the past and it was decided to admit the  patient for IV vancomycin therapy.  Her white blood cell count at the  time of admission was 10.9.  It had been 14.9 three days earlier in the  emergency room.  She was started on IV vancomycin.  Her indurations and  erythema have subsided since her admission.  There was no need for  drainage of an abscess, there was no abscess formed.  She is being  discharged back home with home health and intravenous vancomycin  therapy.  She is to continue the IV vancomycin for another 10 days.   DISCHARGE INSTRUCTIONS:  The patient is to follow up with Dr. Franky Macho on October 30, 2007.   DISCHARGE MEDICATIONS:  1. Vancomycin IV as dosed per pharmacy.  2. Lisinopril/hydrochlorothiazide 1 tablet p.o. daily.  3. Proventil inhaler 3-4 times a day as needed.  4. Xanax 2 tablets p.o. b.i.d.  5. Ativan 1 mg p.o. p.r.n. anxiety.  6. Duke's mixture mouthwash as needed.  7. Hydrocodone 1-2 tablets p.o. q.4 h p.r.n. pain.  8. Marinol 2.5 mg p.o. t.i.d. after chemotherapy x3 days.   PRINCIPAL DIAGNOSES:  1. Stage III left breast carcinoma.  2. Cellulitis, status post bilateral mastectomies.  3. Anxiety.  4. Hypertension.  5. Chronic obstructive pulmonary disease.   PRINCIPAL  PROCEDURE:  None.      Dalia Heading, M.D.  Electronically Signed     MAJ/MEDQ  D:  10/26/2007  T:  10/26/2007  Job:  [id number]   cc:   Ladona Horns. Mariel Sleet, MD  Fax: [phone]   Chart

## 2010-07-06 NOTE — Op Note (Signed)
[name], [name]                 ACCOUNT NO.:  [id number]   MEDICAL RECORD NO.:  [id number]          PATIENT TYPE:  AMB   LOCATION:  DAY                           FACILITY:  [facility]   PHYSICIAN:  Dalia Heading, M.D.  DATE OF BIRTH:  [birth year]   DATE OF PROCEDURE:  07/13/2007  DATE OF DISCHARGE:                               OPERATIVE REPORT   PREOPERATIVE DIAGNOSIS:  Left breast carcinoma.   POSTOPERATIVE DIAGNOSIS:  Left breast carcinoma.   PROCEDURE:  Port-A-Cath insertion.   SURGEON:  Dalia Heading, MD   ANESTHESIA:  MAC.   INDICATIONS:  The patient is a 50 year old white female who is about to  undergo chemotherapy for treatment of left breast carcinoma, stage II.  The risks and benefits of the procedure including bleeding, infection,  pneumothorax were fully explained to the patient, who gave informed  consent.   PROCEDURE NOTE:  The patient was placed in the Trendelenburg position  after the right upper chest was prepped and draped using the usual  sterile technique with Betadine.  Surgical site confirmation was  performed.  Xylocaine 1% was used for local anesthesia.   A transverse incision was made below the right clavicle.  A subcutaneous  pocket was then formed.  A needle was advanced into the right subclavian  vein using the Seldinger technique without difficulty.  A guidewire was  then advanced into the right atrium under fluoroscopic guidance without  difficulty.  An introducer and peel-away sheath were placed over the  guidewire.  The catheter was then inserted through the peel-away sheath  and the peel-away sheath was removed.  The catheter was then attached to  the port and the port placed in subcutaneous pocket.  Adequate  positioning was confirmed by fluoroscopy.  The port was flushed with  3000 units of heparin.  The subcutaneous layer was reapproximated using  a 3-0 Vicryl interrupted suture.  The skin was closed using a 4-0 Vicryl  subcuticular  suture.  Dermabond was then applied.   All tape and needle counts were correct at the end of the procedure.  The patient was transferred to day surgery in stable condition.  Chest x-  ray was performed at that time.   COMPLICATIONS:  None.   SPECIMEN:  None.   ESTIMATED BLOOD LOSS:  Minimal.      Dalia Heading, M.D.  Electronically Signed     MAJ/MEDQ  D:  07/13/2007  T:  07/13/2007  Job:  [id number]   cc:   Ladona Horns. Mariel Sleet, MD  Fax: [phone]   [facility] Department

## 2010-07-06 NOTE — H&P (Signed)
[name], [name]                 ACCOUNT NO.:  [id number]   MEDICAL RECORD NO.:  [id number]          PATIENT TYPE:  AMB   LOCATION:  DAY                           FACILITY:  [facility]   PHYSICIAN:  Dalia Heading, M.D.  DATE OF BIRTH:  1960/12/18   DATE OF ADMISSION:  DATE OF DISCHARGE:  LH                              HISTORY & PHYSICAL   CHIEF COMPLAINT:  Left breast carcinoma, stage III.   HISTORY OF PRESENT ILLNESS:  The patient is a 50 year old white female  who has received neoadjuvant chemotherapy for a stage III left breast  carcinoma, who now presents for surgery.  Given her minimal response and  her concern for further breast cancer on the right side, she desires a  left modified radical mastectomy and a right simple mastectomy.   PAST MEDICAL HISTORY:  COPD and hypertension.   PAST SURGICAL HISTORY:  A Port-A-Cath placement in May 2009,  hysterectomy.   CURRENT MEDICATIONS:  Albuterol inhaler, lisinopril/hydrochlorothiazide.   ALLERGIES:  PENICILLIN and CODEINE.   REVIEW OF SYSTEMS:  The patient smokes cigarettes daily.  She drinks  alcohol socially.  She denies any other cardiopulmonary difficulties or  bleeding disorders.   PHYSICAL EXAMINATION:  GENERAL:  The patient is a well-developed, well-  nourished white female who appears older than her stated age.  She is in  no acute distress.  LUNGS:  Clear to auscultation with equal breath sounds bilaterally.  HEART:  Regular rate and rhythm without S3, S4, or murmurs.  BREASTS:  Left breast examination reveals a dominant mass in the upper,  outer quadrant of the left breast.  No nipple discharge or dimpling is  noted.  The axilla is negative for palpable nodes.  The mass previously  measured 5.9 cm in size.  Right breast examination reveals no dominant  mass, nipple discharge, or dimpling.  The axilla is negative for  palpable nodes.   IMPRESSION:  Left breast carcinoma, stage III.   PLAN:  The patient will undergo  left modified radical mastectomy, right  simple mastectomy on October 12, 2007.  The risks and benefits of the  procedure including bleeding, infection, arm swelling, and the possible  recurrence of the breast cancer as well as the possibility of a blood  transfusion were fully explained to the patient, gave informed consent.      Dalia Heading, M.D.  Electronically Signed     MAJ/MEDQ  D:  10/08/2007  T:  10/09/2007  Job:  [id number]   cc:   Short Stay, Lynann Bologna. Mariel Sleet, MD  Fax: [phone]

## 2010-07-06 NOTE — H&P (Signed)
NAMESEANNE, [name]                 ACCOUNT NO.:  [id number]   MEDICAL RECORD NO.:  [id number]          PATIENT TYPE:  AMB   LOCATION:  DAY                           FACILITY:  [facility]   PHYSICIAN:  Dalia Heading, M.D.  DATE OF BIRTH:  September 21, 1960   DATE OF ADMISSION:  07/08/2007  DATE OF DISCHARGE:  05/17/2009LH                              HISTORY & PHYSICAL   CHIEF COMPLAINT:  Left breast carcinoma, need for central venous access.   HISTORY OF PRESENT ILLNESS:  The patient is a 50 year old white female  who is referred for evaluation and treatment of left breast carcinoma.  She is about to undergo Port-a-Cath insertion for chemotherapy.   PAST MEDICAL HISTORY:  1. COPD.  2. Hypertension.   PAST SURGICAL HISTORY:  Hysterectomy.   CURRENT MEDICATIONS:  Albuterol inhaler, and  lisinopril/hydrochlorothiazide.   ALLERGIES:  PENICILLIN and CODEINE.   REVIEW OF SYSTEMS:  The patient smokes two pack cigarettes a day.  She  drinks alcohol socially.  She denies any other cardiopulmonary  difficulties or bleeding disorders.   PHYSICAL EXAMINATION:  The patient is a well-developed, well-nourished  white female in no acute distress.  Neck is supple without lymphadenopathy.  Lungs are clear to auscultation with equal breath sounds bilaterally.  Heart examination reveals regular rate and rhythm without S3, S4, or  murmurs.   IMPRESSION:  Left breast carcinoma, need for central venous access.   PLAN:  The patient is scheduled for the Port-a-Cath insertion on Jul 13, 2007.  The risks and benefits of the procedure including bleeding,  infection, and pneumothorax were fully explained to the patient, gave  informed consent.      Dalia Heading, M.D.  Electronically Signed     MAJ/MEDQ  D:  07/10/2007  T:  07/11/2007  Job:  [id number]   cc:   Short Stay   [facility]

## 2010-07-09 NOTE — H&P (Signed)
[name]                 ACCOUNT NO.:  [id number]   MEDICAL RECORD NO.:  [id number]          PATIENT TYPE:  AMB   LOCATION:  DAY                           FACILITY:  [facility]   PHYSICIAN:  [name], M.D.   DATE OF BIRTH:  [birth year]   DATE OF ADMISSION:  DATE OF DISCHARGE:  LH                                HISTORY & PHYSICAL   HISTORY OF PRESENT ILLNESS:  A 50 year old female with a history of chronic  return of perirectal abscesses and fistulas with increasing hemorrhoids and  swelling.  The patient has extreme pain with worsening drainage.  She is  scheduled to have wide excision of multiple fistulous tracts of the left  buttock and hemorrhoidectomy.   PAST HISTORY:  1.  Hypertension.  2.  Migraine headaches.  3.  Chronic anxiety disorder.  4.  Depression.  5.  Atopic dermatitis.   MEDICATIONS:  1.  Xanax 1 mg t.i.d.  2.  Vicodin 5/500 t.i.d.  3.  Lotrel 10/40 daily.   REVIEW OF SYSTEMS:  Positive for chronic hemorrhoidal swelling, recurrent  perirectal drainage, severe rash of the face, chronic fatigue, chronic  depression.   SOCIAL HISTORY:  She is single, mother of 5, with primary care for 5  grandchildren.   ALLERGIES:  1.  PENICILLIN.  2.  CODEINE.  3.  CEPHALOSPORINS.   PHYSICAL EXAMINATION:  VITAL SIGNS:  Blood pressure 160/90, pulse 78,  respirations 20, weight 190 pounds.  HEENT:  Unremarkable.  NECK:  Supple.  No JVD, bruit, adenopathy, or thyromegaly.  CHEST:  Clear to auscultation.  HEART:  Regular rate and rhythm without murmur, gallop, or rub.  ABDOMEN:  Soft, nontender.  RECTAL:  Multiple chronic fistulous openings in the left buttock which  extend to the anal canal.  She has internal and external hemorrhoids.  EXTREMITIES:  No clubbing, cyanosis, or edema.  NEUROLOGIC:  No focal motor, sensory or cerebellar deficits.   IMPRESSION:  1.  Chronic recurrent perirectal abscesses and fistulas.  2.  Hemorrhoid disease.  3.   Hypertension.  4.  Migraine headaches.  5.  Chronic anxiety disorder.   PLAN:  The patient will have a wide excision of the perirectal abscesses  with delayed closure, and will have a hemorrhoidectomy.      [name], M.D.  Electronically Signed     LCS/MEDQ  D:  03/24/2005  T:  03/25/2005  Job:  [id number]   cc:   Short Stay, [facility]

## 2010-07-09 NOTE — H&P (Signed)
[name], [name]                 ACCOUNT NO.:  [id number]   MEDICAL RECORD NO.:  [id number]          PATIENT TYPE:  AMB   LOCATION:  DAY                           FACILITY:  [facility]   PHYSICIAN:  Jerolyn Shin C. Katrinka Blazing, M.D.   DATE OF BIRTH:  12/31/1960   DATE OF ADMISSION:  03/03/2003  DATE OF DISCHARGE:  LH                                HISTORY & PHYSICAL   HISTORY OF PRESENT ILLNESS:  A 50 year old female with history of severe  menses for many years. She is gravida 5, para 5, ABO with her youngest being  49 years old, status post tubal ligation in 1989. She was seen in early  October with complaint of severe menstrual bleeding with the bleeding up to  30 days of the month. She complained of feeling extremely tired. She states  that she has heavy bleeding for 3 weeks and then lightens off for 7 days.  She normally uses 4 super pads at a time and uses up to 12 packs of super  pads per month. She has had severe pain with bleeding. [facility] but [facility] given. She was  cycled with oral contraceptives without improvement. She was then placed on  long term Provera. She still had bleeding through the Provera. Ultrasound of  the abdomen shows thick endometrial strip with heterogeneous mild metrial  echogenicity, scattered calcifications compatible with multiple leiomyomas.  Ovaries are normal size. There are no adnexal masses. She finally has  decided to proceed with a hysterectomy because of uncontrolled symptoms. She  has been informed that hysterectomy is a permanent procedure. Oophorectomy  will not be done.   PAST MEDICAL HISTORY:  Depression with anxiety, atopic dermatitis.   MEDICATIONS:  Xanax 1 mg t.i.d., Provera 10 mg b.i.d., Vicodin 5/500 t.i.d.  p.r.n., Celexa 20 mg daily.   ALLERGIES:  PENICILLIN, CODEINE, AND CEPHALOSPORINS.   PAST SURGICAL HISTORY:  None except for tubal ligation.   FAMILY HISTORY:  Positive for  hypertension, atherosclerotic heart disease,  stroke.   SOCIAL HISTORY:  She is separated, unemployed. She smokes 1/2 pack of  cigarettes per day for over 15 years. She does not drink or use drugs.   PHYSICAL EXAMINATION:  VITAL SIGNS:  Blood pressure 135/85, pulse 80,  respiratory rate 18, weight 199 pounds.  HEENT:  Unremarkable.  NECK:  Supple without jugular venous distention or bruit.  CHEST:  Clear to auscultation.  HEART:  Regular rate and rhythm without murmur, rub, or gallop.  ABDOMEN:  Mild suprapubic tenderness. Otherwise unremarkable. No distention.  EXTREMITIES:  No clubbing, cyanosis, or edema.  SKIN:  Extensive erythematous scattered papular rash of the plantar rash of  the feet, also on the knees and hands, compatible with an eczematous rash or  atopic dermatitis.  NEUROLOGIC:  No focal, motor, sensory, or cerebellar deficits.  PELVIC:  Normal external genitalia. No vaginal discharge. Tender cervix.  Normal size uterus but extremely tender. No adnexal masses.   IMPRESSION:  1.  Severe dysfunctional uterine bleeding for many years, uncontrolled by  multiple medications with bleeding multiple month in spite of      medications.  2.  Diffuse uterine fibroids.  3.  Depression, situational.  4.  Atopic dermatitis.   PLAN:  The patient will have abdominal hysterectomy.     Lero   LCS/MEDQ  D:  03/01/2004  T:  03/01/2004  Job:  [id number]   cc:   [facility]

## 2010-07-09 NOTE — Discharge Summary (Signed)
NAMEJULIETH, TUGMAN                 ACCOUNT NO.:  [id number]   MEDICAL RECORD NO.:  [id number]          PATIENT TYPE:  INP   LOCATION:  A336                          FACILITY:  [facility]   PHYSICIAN:  Dirk Dress. Katrinka Blazing, M.D.   DATE OF BIRTH:  Dec 02, 1960   DATE OF ADMISSION:  03/25/2005  DATE OF DISCHARGE:  02/05/2007LH                                 DISCHARGE SUMMARY   DISCHARGE DIAGNOSES:  1.  Internal and external hemorrhoids.  2.  Perianal and perirectal fistula.  3.  Hypertension.  4.  Migraine headaches.  5.  Chronic anxiety disorder.  6.  Depression.   PROCEDURE:  Hemorrhoidectomy and wide-excision of perirectal fistula on left  buttock.   DISPOSITION:  The patient is discharged home in stable, satisfactory  condition.   DISCHARGE MEDICATIONS:  1.  Lotrel 10/40 one daily.  2.  Xanax 1 mg three times daily.  3.  Levaquin 750 mg daily.  4.  Cleocin 150 mg q.6h.  5.  Dilaudid 2 mg every 4-6 hours as needed for pain.   FOLLOW UP:  She is scheduled to be seen in the office 2 weeks post  discharge.   HOSPITAL COURSE:  A 50 year old female with history of chronic perirectal  abscesses and fistulas with increasing hemorrhoids and hemorrhoidal  swelling. The patient has extreme pain with worsening drainage.  She was  scheduled to have wide-excision of the fistulous tracts of the left buttock  excised and hemorrhoidectomy.  Past history is noted admission noted.  Examination was unremarkable except she had internal and external  hemorrhoids and she had chronic fistula openings in the left buttock which  extended up to the anal canal.  The patient was scheduled as an outpatient.  She was admitted and underwent wide-excision of the fistulous tracts and  wide-excision of three complexes of hemorrhoids.  She was stable in the  perioperative period.  She had severe pain initially.  After  about 48 hours, the pain was improved.  She was able to have a regular bowel  movements.  There was  no major purulence the second day.  White count  remained normal.  She was discharged home on the postop day #3 in  satisfactory condition.      Dirk Dress. Katrinka Blazing, M.D.  Electronically Signed     LCS/MEDQ  D:  04/24/2005  T:  04/25/2005  Job:  [id number]

## 2010-07-09 NOTE — Op Note (Signed)
[name], CAPILI                 ACCOUNT NO.:  [id number]   MEDICAL RECORD NO.:  [id number]          PATIENT TYPE:  INP   LOCATION:  A336                          FACILITY:  [facility]   PHYSICIAN:  Dirk Dress. Katrinka Blazing, M.D.   DATE OF BIRTH:  1961-01-10   DATE OF PROCEDURE:  03/25/2005  DATE OF DISCHARGE:  03/28/2005                                 OPERATIVE REPORT   PREOPERATIVE DIAGNOSIS:  Hemorrhoid disease, chronic perirectal fistula.   POSTOPERATIVE DIAGNOSIS:  Hemorrhoid disease, chronic perirectal fistula.   PROCEDURE:  Hemorrhoidectomy, internal and external, and wide excision of  her rectal fistula, left buttock.   SURGEON:  Dr. Katrinka Blazing.   DESCRIPTION:  Under general anesthesia, the patient was placed in a prone  position. Inspection of the anus and the perianal area was carried out.  There was four draining fistulous tracks in the medial aspect of the left  buttock of the perirectal area. She had three complexes of hemorrhoids. It  was decided to do a hemorrhoidectomy and wide excision of the fistulous  track.   Digital dilatation of the anus was carried out. Operating anoscope was  introduced. She had three major complexes of hemorrhoids each complex was  isolated. It was high ligated with 2-0 chromic. The internal and external  cushions were excised. Hemostasis was achieved. The mucosa was closed with  running locking 2-0 chromic. This was done in each of the three areas. The  patient tolerated this well.   Next, the fistulous tracks were isolated in the left perianal space. Small  probe was placed in the track. I could not get any of the probes to enter  the perianal space, though normally this does. Normally this can be  identified. There were no sentinel piles. There was no evidence of  inflammation of the anal canal that could be identified when  hemorrhoidectomy was being done. Nevertheless, the fistulous tracks were  isolated, and they were widely excised. A wide  elliptical incision was made  around the tracks, and with the probes in place, the course of the probes  were followed to the perianal space. I could not find the point where the  track went through the sphincter muscle. The internal and external  sphincters were not violated. There was a small amount of purulence in the  depth of the wound, and this was excised until all tissue was removed. Since  there was all viable tissue without any major purulence, the area was  flushed, and it was then closed in serial layers using 2-0 Monocryl up to  the subcutaneous tissue.  Subcutaneous tissue was loosely approximated with 3-0 chromic. The patient  tolerated the procedure well. Dressing was placed. She was turned to the  supine position on the stretcher. She was extubated and taken to the post-  anesthesia care unit in satisfactory condition.      Dirk Dress. Katrinka Blazing, M.D.  Electronically Signed     LCS/MEDQ  D:  04/24/2005  T:  04/25/2005  Job:  [id number]

## 2010-07-09 NOTE — Op Note (Signed)
[name], [name]                 ACCOUNT NO.:  [id number]   MEDICAL RECORD NO.:  [id number]          PATIENT TYPE:  INP   LOCATION:  A427                          FACILITY:  [facility]   PHYSICIAN:  Dirk Dress. Katrinka Blazing, M.D.   DATE OF BIRTH:  Jul 19, 1960   DATE OF PROCEDURE:  03/02/2004  DATE OF DISCHARGE:  03/04/2004                                 OPERATIVE REPORT   PREOPERATIVE DIAGNOSIS:  Severe dysfunctional uterine bleeding with uterine  fibroids.   POSTOPERATIVE DIAGNOSIS:  Severe dysfunction uterine bleeding with uterine  fibroids.   PROCEDURE:  Total abdominal hysterectomy.   SURGEON:  Dr. Katrinka Blazing.   DESCRIPTION:  Under general endotracheal anesthesia, the patient's abdomen  was prepped and draped in a sterile field. Lower midline incision was made.  Abdomen was entered. Upper abdomen was explored and was unremarkable. The  uterus was moderately enlarged with a nodular uterine fundus without major  fibroids. Felt that these represented probable intramural fibroids. The  ovaries showed some chronic scarring but otherwise were unremarkable. There  were no major cyst or masses.   The round ligaments were isolated. They were doubly clamped with Kelly  clamps, divided and secured with ligatures of 0 Vicryl. Anterior bladder  flap was developed. The junction of the tube and the infundibular pelvic  ligament with the uterus were doubly clamped with Kelly clamps and divided.  Each was secured with two ligatures of 5-0 Vicryl. Using Kocher clamps,  initially the upper infundibular pelvic ligament was clamped, divided and  tied with ligature of 0 Vicryl. The uterine vessels were then serially  clipped on each side with Heaney clamps, divided with electrocautery and  controlled with ligatures of 0 Vicryl. This was continued down to the apex  of the vagina. A curved Heaney clamp was used, and the apex of the vagina  was then serially clamped, divided and controlled with ligatures of 0  Vicryl. The uterus and cervix were totally excised. The vaginal apex was  then reinforced using running locking 0 Vicryl. Hemostasis was ensured.  Reperitonealization of the pelvis was carried out using 3-0 Monocryl.  Irrigation was carried out. There was about 100 cc of blood loss. The  ureters were isolated on each side and were preserved. Being satisfied that  there was no bleeding, sponge count was carried out. This was correct x2.  The abdomen was closed using #1 Prolene on the fascia, 3-0 and 2-0 Monocryl  on the subcutaneous tissue and staples on skin. Sterile dressing was placed.  The patient tolerated the procedure well. She was awakened from anesthesia,  transferred to a bed and taken to the postanesthetic care unit for further  monitoring.     LCS/MEDQ  D:  05/02/2004  T:  05/03/2004  Job:  [id number]

## 2010-07-09 NOTE — Discharge Summary (Signed)
[name], [name]                 ACCOUNT NO.:  [id number]   MEDICAL RECORD NO.:  [id number]          PATIENT TYPE:  INP   LOCATION:  A427                          FACILITY:  [facility]   PHYSICIAN:  Dirk Dress. Katrinka Blazing, M.D.   DATE OF BIRTH:  Jul 15, 1960   DATE OF ADMISSION:  03/02/2004  DATE OF DISCHARGE:  01/12/2006LH                                 DISCHARGE SUMMARY   DISCHARGE DIAGNOSES:  1.  Dysfunctional uterine bleeding.  2.  Adenomyosis.  3.  Squamous metaplasia of the uterine cervix.   SPECIAL PROCEDURES:  Total abdominal hysterectomy.   DISPOSITION:  The patient is discharged home in stable and satisfactory  condition.   FOLLOW UP:  In the office two weeks post discharge.   DISCHARGE MEDICATIONS:  1.  Tylox 1-2 every 4 hours as needed for pain.  2.  Phenergan 25 mg every 4 hours as needed for nausea.  3.  Restoril 30 mg q.h.s. as needed for sleep.   SUMMARY:  A 50 year old female with a history of severe dysfunctional  uterine bleeding who is status post tubal ligation.  [facility] with dysfunctional bleeding without  any improvement.  Hormonal therapy was unremarkable.  An ultrasound revealed  multiple uterine leiomyomas with a thick endometrial stripe and scattered  calcifications.  Her history otherwise was unremarkable.  The patient was  admitted through day surgery and underwent a total abdominal hysterectomy  uneventfully.  She had a very smooth postoperative course and felt well on  the first postoperative day.  She was discharged on the early afternoon of  the first postoperative day in satisfactory condition.      LCS/MEDQ  D:  05/02/2004  T:  05/03/2004  Job:  [id number]

## 2010-09-08 ENCOUNTER — Telehealth (HOSPITAL_COMMUNITY): Payer: Self-pay | Admitting: *Deleted

## 2010-09-13 ENCOUNTER — Telehealth (HOSPITAL_COMMUNITY): Payer: Self-pay | Admitting: *Deleted

## 2010-09-13 NOTE — Telephone Encounter (Signed)
See telephone encounter.

## 2010-09-13 NOTE — Telephone Encounter (Signed)
Pt. Called c/o back pain that is severe. She can not do yard work and take care of her animals. She is on gabapentin 1 am and 3 pm and requests something else for pain. (no codiene). I asked if she has primary care md and she said no that "she is not going to one of those quacks"

## 2010-09-14 ENCOUNTER — Encounter (HOSPITAL_COMMUNITY): Payer: Self-pay | Admitting: Oncology

## 2010-09-14 ENCOUNTER — Encounter (HOSPITAL_COMMUNITY): Payer: Medicaid Other | Attending: Oncology | Admitting: Oncology

## 2010-09-14 DIAGNOSIS — C50919 Malignant neoplasm of unspecified site of unspecified female breast: Secondary | ICD-10-CM

## 2010-09-14 DIAGNOSIS — M62830 Muscle spasm of back: Secondary | ICD-10-CM

## 2010-09-14 DIAGNOSIS — M549 Dorsalgia, unspecified: Secondary | ICD-10-CM

## 2010-09-14 DIAGNOSIS — F1210 Cannabis abuse, uncomplicated: Secondary | ICD-10-CM

## 2010-09-14 DIAGNOSIS — J449 Chronic obstructive pulmonary disease, unspecified: Secondary | ICD-10-CM

## 2010-09-14 DIAGNOSIS — F411 Generalized anxiety disorder: Secondary | ICD-10-CM

## 2010-09-14 DIAGNOSIS — A4902 Methicillin resistant Staphylococcus aureus infection, unspecified site: Secondary | ICD-10-CM

## 2010-09-14 HISTORY — DX: Methicillin resistant Staphylococcus aureus infection, unspecified site: A49.02

## 2010-09-14 HISTORY — DX: Chronic obstructive pulmonary disease, unspecified: J44.9

## 2010-09-14 HISTORY — DX: Cannabis abuse, uncomplicated: F12.10

## 2010-09-14 MED ORDER — NAPROXEN 500 MG PO TABS: 500.0000 mg | ORAL_TABLET | Freq: Two times a day (BID) | ORAL | Status: DC

## 2010-09-14 MED ORDER — CYCLOBENZAPRINE HCL 5 MG PO TABS: 5.0000 mg | ORAL_TABLET | Freq: Three times a day (TID) | ORAL | Status: AC | PRN

## 2010-09-14 MED ORDER — CYCLOBENZAPRINE HCL 5 MG PO TABS: 5.0000 mg | ORAL_TABLET | Freq: Three times a day (TID) | ORAL | Status: DC | PRN

## 2010-09-14 MED ORDER — ALPRAZOLAM 0.5 MG PO TABS: 0.5000 mg | ORAL_TABLET | Freq: Three times a day (TID) | ORAL | Status: DC | PRN

## 2010-09-14 NOTE — Progress Notes (Signed)
[name], [place], [place] [address] [place] [address]  1. ANXIETY  ALPRAZolam (XANAX) 0.5 MG tablet  2. Muscle spasm of back  cyclobenzaprine (FLEXERIL) 5 MG tablet, naproxen (NAPROSYN) 500 MG tablet, naproxen (NAPROSYN) 500 MG tablet, DISCONTINUED: naproxen (NAPROSYN) 500 MG tablet, DISCONTINUED: cyclobenzaprine (FLEXERIL) 5 MG tablet, DISCONTINUED: naproxen (NAPROSYN) 500 MG tablet  3. H/O Stage III Breast cancer    4. COPD (chronic obstructive pulmonary disease)    5. Marijuana abuse in the past    6. H/O MRSA infection      CURRENT THERAPY: S/P AC x 4 cycles (08/06/2007 - 09/18/2007) followed by Taxol x 4 cycles given on Day 1, 8, and 15 finishing on 04/18/08. Mastectomy on 10/12/2007  INTERVAL HISTORY: [name] 50 y.o. female returns as a walk-in for back pain.  This back pain began 2 weeks ago with a sudden onset upon waking one morning.  She explains that the pain has been extremely bad and causing her not to sleep.  Due to this, she increased the frequency of her xanax to help her sleep, but it did not help.  She also tried a number of OTC medications to help her sleep including benadryl and Nyquil.  None of these medications have helped her sleep.  The patient reports that nothing has helped with the pain.  She has tried Ibuprofen, tylenol, and a number of over the counter medications.  She reports having "jolts" of intense pain that drops her to her knees occasionally.  She demonstrates a significant amount of frustration about this discomfort.  She denies any trauma to this area or trauma that may have caused this irritation.  Past Medical History  Diagnosis Date  . COPD (chronic obstructive pulmonary disease) 09/14/2010  . 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 H/O MRSA infection 09/14/2010    has TINEA PEDIS; ANXIETY; DEPRESSION; HYPERTENSION; H/O Stage III Breast cancer; COPD (chronic obstructive pulmonary disease); Marijuana abuse in the past; and  H/O MRSA infection on her problem list.     is allergic to codeine and penicillins.  Ms. Graylon Gunning had no medications administered during this visit.  No past surgical history on file.  Denies any headaches, dizziness, double vision, fevers, chills, night sweats, nausea, vomiting, diarrhea, constipation, chest pain, heart palpitations, shortness of breath, blood in stool, black tarry stool, urinary pain, urinary burning, urinary frequency, hematuria.   PHYSICAL EXAMINATION  Filed Vitals:   09/14/10 1023  BP: 143/93  Pulse: 91  Temp: 98.4 F (36.9 C)    GENERAL:alert, no distress, well nourished, comfortable, cooperative and distressed SKIN: skin color, texture, turgor are normal, no rashes or significant lesions HEAD: Normocephalic, No masses, lesions, tenderness or abnormalities EYES: normal EARS: External ears normal OROPHARYNX:not examined  NECK: trachea midline LYMPH:  No epitrochlear nodes BREAST:not examined LUNGS: clear to auscultation  HEART: regular rate & rhythm, no murmurs, no gallops, S1 normal and S2 normal ABDOMEN:abdomen soft, non-tender, obese and normal bowel sounds BACK: Back symmetric, no curvature., No CVA tenderness, Mild to moderate spasm of paralumbar muscles, no skin lesions, erythema or scars, latissimus dorsi muscle tenderness to palpation B/L EXTREMITIES:less then 2 second capillary refill, no joint deformities, effusion, or inflammation, no edema, no skin discoloration, no clubbing, no cyanosis  NEURO: alert & oriented x 3 with fluent speech, no focal motor/sensory deficits, gait normal    ASSESSMENT:  1. Paralumbar muscles spasm and inflammation 2. H/O Breast cancer   PLAN:  1. Naproxen 500 mg BID x 10 days 2. Flexeril 5 mg TID x 10 days 3. Patient education about flexeril causing drowsiness so I informed the patient not to drive or operate heavy machinery after taking the medication. 4. Refill on her Xanax medication. #75 5. Patient  education about muscle spasms and their treatment which includes a muscle relaxant and an anti-inflammatory medication. 6. Return to the clinic as scheduled.   All questions were answered. The patient knows to call the clinic with any problems, questions or concerns. We can certainly see the patient much sooner if necessary.  The patient and plan discussed with Glenford Peers, MD and he is in agreement with the aforementioned.  I spent 25 minutes counseling the patient face to face. The total time spent in the appointment was 40 minutes.  Lealer Marsland

## 2010-09-24 ENCOUNTER — Other Ambulatory Visit (HOSPITAL_COMMUNITY): Payer: Self-pay | Admitting: Oncology

## 2010-09-24 DIAGNOSIS — L0390 Cellulitis, unspecified: Secondary | ICD-10-CM

## 2010-09-24 MED ORDER — DOXYCYCLINE HYCLATE 100 MG PO TABS: 100.0000 mg | ORAL_TABLET | Freq: Two times a day (BID) | ORAL | Status: DC

## 2010-09-28 ENCOUNTER — Encounter (HOSPITAL_COMMUNITY): Payer: Medicaid Other | Attending: Oncology | Admitting: Oncology

## 2010-09-28 ENCOUNTER — Ambulatory Visit (HOSPITAL_COMMUNITY)
Admission: RE | Admit: 2010-09-28 | Discharge: 2010-09-28 | Disposition: A | Discharge status: Home / Self Care | Payer: Medicaid Other | Source: Ambulatory Visit | Attending: Oncology | Admitting: Oncology

## 2010-09-28 VITALS — BP 172/115 | HR 88 | Temp 98.7°F | Wt 225.4 lb

## 2010-09-28 DIAGNOSIS — M545 Low back pain, unspecified: Secondary | ICD-10-CM | POA: Insufficient documentation

## 2010-09-28 DIAGNOSIS — A4902 Methicillin resistant Staphylococcus aureus infection, unspecified site: Secondary | ICD-10-CM

## 2010-09-28 DIAGNOSIS — L0390 Cellulitis, unspecified: Secondary | ICD-10-CM

## 2010-09-28 DIAGNOSIS — C50919 Malignant neoplasm of unspecified site of unspecified female breast: Secondary | ICD-10-CM

## 2010-09-28 DIAGNOSIS — L0291 Cutaneous abscess, unspecified: Secondary | ICD-10-CM | POA: Insufficient documentation

## 2010-09-28 DIAGNOSIS — M549 Dorsalgia, unspecified: Secondary | ICD-10-CM

## 2010-09-28 DIAGNOSIS — R21 Rash and other nonspecific skin eruption: Secondary | ICD-10-CM

## 2010-09-28 DIAGNOSIS — M47817 Spondylosis without myelopathy or radiculopathy, lumbosacral region: Secondary | ICD-10-CM | POA: Insufficient documentation

## 2010-09-28 MED ORDER — GABAPENTIN 300 MG PO CAPS: 300.0000 mg | ORAL_CAPSULE | Freq: Two times a day (BID) | ORAL | Status: DC

## 2010-09-28 MED ORDER — DICLOFENAC SODIUM 50 MG PO TBEC: 50.0000 mg | DELAYED_RELEASE_TABLET | Freq: Three times a day (TID) | ORAL | Status: AC

## 2010-09-28 MED ORDER — CLINDAMYCIN HCL 300 MG PO CAPS: 300.0000 mg | ORAL_CAPSULE | Freq: Three times a day (TID) | ORAL | Status: AC

## 2010-09-28 MED ORDER — CYCLOBENZAPRINE HCL 10 MG PO TABS: 10.0000 mg | ORAL_TABLET | Freq: Three times a day (TID) | ORAL | Status: AC | PRN

## 2010-09-28 NOTE — Patient Instructions (Signed)
[facility]  Discharge Instructions  SPECIAL INSTRUCTIONS/FOLLOW-UP: Xray Studies Needed today.  Take the paper to xray and have your xray completed today.  Return to Clinic in two months.     I acknowledge that I have been informed and understand all the instructions given to me and received a copy. I do not have any more questions at this time, but understand that I may call the Specialty Clinic at [facility] at [phone] during business hours should I have any further questions or need assistance in obtaining follow-up care.    __________________________________________  _____________  __________ Signature of Patient or Authorized Representative            Date                   Time    __________________________________________ Nurse's Signature

## 2010-09-28 NOTE — Progress Notes (Signed)
Tereso Newcomer, [place], [place] [address] [place] [address]  1. Back pain  DG Lumbar Spine Complete  2. Cellulitis  clindamycin (CLEOCIN) 300 MG capsule  3. H/O Stage III Breast cancer  gabapentin (NEURONTIN) 300 MG capsule  4. H/O MRSA infection      CURRENT THERAPY:S/P preoperative dose dense AC prior to mastectomy which was on 10/12/2007  INTERVAL HISTORY: Jerome Otter 50 y.o. female returns for  regular  visit for followup of her back pain and facial cellulitis.  She reports that the facial rash began one week ago.  This infection occurs frequently for the patient likley secondary to colonization of MRSA.  In the past it was treated with Doxycycline with great results.  However, the patient reports that the rash has gotten worse since being on the antibiotic.  As a result, she saw another provider who prescribed minocycline which is a sister medication.  Due to the failure of the antibiotic, I prescribed clindamycin to the patient.    The patient also requests increasing the potency of her gabapentin so she will not have to take some many of the 100 mg capsules.  The patient also complains of continuation of her low back pain.  She reports that it is no better than when I saw the patient.  I saw her a few weeks ago and prescribed Flexeril and an antiinflammatory.  She reports that she has spasms and occasionally get "stuck" when moving is certainly directions with increase in the pain.  She reports the discomfort is lateral to her lumbar spine.   Past Medical History  Diagnosis Date  . COPD (chronic obstructive pulmonary disease) 09/14/2010  . 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 H/O MRSA infection 09/14/2010    has TINEA PEDIS; ANXIETY; DEPRESSION; HYPERTENSION; H/O Stage III Breast cancer; COPD (chronic obstructive pulmonary disease); Marijuana abuse in the past; and H/O MRSA infection on her problem list.     is allergic to codeine and penicillins.  Ms.  Graylon Gunning does not currently have medications on file.  No past surgical history on file.  Denies any headaches, dizziness, double vision, fevers, chills, night sweats, nausea, vomiting, diarrhea, constipation, chest pain, heart palpitations, shortness of breath, blood in stool, black tarry stool, urinary pain, urinary burning, urinary frequency, hematuria.   PHYSICAL EXAMINATION  ECOG PERFORMANCE STATUS: 1 - Symptomatic but completely ambulatory  Filed Vitals:   09/28/10 0917  BP: 172/115  Pulse: 88  Temp: 98.7 F (37.1 C)    GENERAL:alert, well nourished, well developed, cooperative and crying SKIN: skin color, texture, turgor are normal, positive XBJ:[id number] conglomerate on the forehead, nose, and checks and chin.  There is a clotted and healing wound on the left nostril that is Likely the source of the infection. The left nostril is painful.  Slight heat noted in the erythematous region. HEAD: Normocephalic, see above EYES: normal EARS: External ears normal OROPHARYNX:mucous membranes are moist  NECK: trachea midline LYMPH:  No epitrochlear nodes BREAST:not examined LUNGS: clear to auscultation and percussion HEART: regular rate & rhythm, no murmurs, no gallops, S1 normal and S2 normal ABDOMEN:abdomen soft, non-tender, obese and normal bowel sounds BACK: Back symmetric, no curvature., No CVA tenderness, no tenderness noted on palpation.  No spinal tenderness EXTREMITIES:less then 2 second capillary refill, no joint deformities, effusion, or inflammation, no edema, no skin discoloration, no clubbing, no cyanosis  NEURO: alert & oriented x 3 with fluent speech, no focal motor/sensory deficits,  gait normal   RADIOGRAPHIC STUDIES: DG of Lumbar spine ordered     PATHOLOGY: 1. Breast, left, modified radical mastectomy- high grade invasive ductal carcinoma, 2.5 cm, clear resection margins, 0/10 lymph nodes for metastatic disease, grade III, No LVI, ER/PR negative,  Ki-67 22%, Her2 negative. 2. Right breast, simple mastectomy- changes consistent with chemotherapy treatment, fibrocystic changes, no malignancy    ASSESSMENT:  1. Triple negative breast cancer, s/p B/L mastectomy on 10/12/2007 and pre-operative AC chemotherapy 2. Facial cellulitis, refusal to go to the ER 3. H/O MRSA infections in past 4. H/O of cellulitis in the past treated with Doxycycline with good response 5. Low back pain, likely secondary to muscle spasms.   PLAN:  1. E-prescribed Clindamycin 300 mg every 6 hours for 10 days 2. Refilled Gabapentin rx with 300 mg capsules to be taken BID 3. Lumbar X-ray to rule out osteomyelitis or any causes for low back pain. 4. Pending results of x-ray will likely refill Flexeril and give a higher dose and prescribe another anti-inflammatory   All questions were answered. The patient knows to call the clinic with any problems, questions or concerns. We can certainly see the patient much sooner if necessary.  I spent 15 minutes counseling the patient face to face. The total time spent in the appointment was 30 minutes.  Danylah Holden

## 2010-11-03 ENCOUNTER — Other Ambulatory Visit (HOSPITAL_COMMUNITY): Payer: Self-pay | Admitting: Oncology

## 2010-11-03 DIAGNOSIS — F411 Generalized anxiety disorder: Secondary | ICD-10-CM

## 2010-11-16 LAB — CBC
HCT: 38.4
Platelets: 214
RDW: 14.1
WBC: 6.5

## 2010-11-16 LAB — DIFFERENTIAL
Basophils Absolute: 0
Eosinophils Relative: 2
Lymphocytes Relative: 33
Lymphs Abs: 2.2
Neutro Abs: 3.7
Neutrophils Relative %: 57

## 2010-11-16 LAB — BASIC METABOLIC PANEL
BUN: 9
Calcium: 9.4
Creatinine, Ser: 0.71
GFR calc non Af Amer: >60
Glucose, Bld: 107 — ABNORMAL HIGH

## 2010-11-16 LAB — URINALYSIS, ROUTINE W REFLEX MICROSCOPIC
Bilirubin Urine: NEGATIVE
Leukocytes, UA: NEGATIVE
Nitrite: NEGATIVE
Specific Gravity, Urine: >1.03 — ABNORMAL HIGH
Urobilinogen, UA: 0.2

## 2010-11-16 LAB — PREGNANCY, URINE: Preg Test, Ur: NEGATIVE

## 2010-11-16 LAB — URINE MICROSCOPIC-ADD ON

## 2010-11-17 LAB — DIFFERENTIAL
Eosinophils Absolute: 0.1
Eosinophils Relative: 2
Lymphs Abs: 2
Monocytes Relative: 4

## 2010-11-17 LAB — CBC
MCHC: 35
RBC: 4.57

## 2010-11-17 LAB — COMPREHENSIVE METABOLIC PANEL
ALT: 17
AST: 20
Alkaline Phosphatase: 78
CO2: 30
Calcium: 9.6
GFR calc Af Amer: >60
GFR calc non Af Amer: >60
Potassium: 3.8
Sodium: 137
Total Protein: 7.7

## 2010-11-17 LAB — CANCER ANTIGEN 27.29: CA 27.29: 32

## 2010-11-18 LAB — URINALYSIS, ROUTINE W REFLEX MICROSCOPIC
Glucose, UA: NEGATIVE
Leukocytes, UA: NEGATIVE
Specific Gravity, Urine: 1.025

## 2010-11-18 LAB — DIFFERENTIAL
Basophils Absolute: 0
Eosinophils Relative: 1
Eosinophils Relative: 2
Lymphocytes Relative: 28
Lymphocytes Relative: 32
Lymphocytes Relative: 20
Lymphs Abs: 2
Lymphs Abs: 1.4
Monocytes Absolute: 0.4
Monocytes Absolute: 0.5
Monocytes Absolute: 0.6
Monocytes Relative: 9
Neutro Abs: 4.9

## 2010-11-18 LAB — CBC
HCT: 36.2
HCT: 33.6 — ABNORMAL LOW
Hemoglobin: 12.6
MCHC: 34.8
MCHC: 34.1
MCV: 95.3
MCV: 95.2
Platelets: 216
Platelets: 266
RDW: 13.9
RDW: 14.2

## 2010-11-18 LAB — MAGNESIUM: Magnesium: 1.9

## 2010-11-18 LAB — URINE MICROSCOPIC-ADD ON

## 2010-11-18 LAB — COMPREHENSIVE METABOLIC PANEL
AST: 21
Albumin: 3.8
Albumin: 3.2 — ABNORMAL LOW
BUN: 6
Calcium: 10
Calcium: 9.1
Creatinine, Ser: 0.91
Creatinine, Ser: 0.58
GFR calc Af Amer: >60
Potassium: 3.5
Total Protein: 6.7
Total Protein: 6.4

## 2010-11-19 LAB — DIFFERENTIAL
Basophils Relative: 0
Basophils Relative: 0
Lymphocytes Relative: 22
Lymphs Abs: 1.4
Monocytes Absolute: 0.7
Monocytes Relative: 9
Monocytes Relative: 10
Neutro Abs: 5.5
Neutro Abs: 4.4
Neutrophils Relative %: 75
Neutrophils Relative %: 68

## 2010-11-19 LAB — CBC
Hemoglobin: 9.9 — ABNORMAL LOW
MCHC: 34.2
MCHC: 33.5
Platelets: 269
RBC: 3.35 — ABNORMAL LOW
RBC: 3.01 — ABNORMAL LOW
WBC: 7.4
WBC: 6.6

## 2010-11-22 LAB — COMPREHENSIVE METABOLIC PANEL
AST: 20
Albumin: 3.4 — ABNORMAL LOW
BUN: 7
Creatinine, Ser: 0.65
GFR calc Af Amer: >60
Potassium: 3.2 — ABNORMAL LOW
Total Protein: 6.6

## 2010-11-22 LAB — CBC
HCT: 34.4 — ABNORMAL LOW
MCV: 97.1
Platelets: 211
RDW: 16.8 — ABNORMAL HIGH
WBC: 5.8

## 2010-11-22 LAB — DIFFERENTIAL
Eosinophils Relative: 3
Lymphocytes Relative: 19
Lymphs Abs: 1.1
Monocytes Absolute: 0.3
Monocytes Relative: 5
Neutro Abs: 4.2

## 2010-11-22 LAB — CULTURE, BLOOD (SINGLE)
Culture: NO GROWTH
Report Status: 10112009

## 2010-11-23 LAB — DIFFERENTIAL
Basophils Relative: 1
Eosinophils Absolute: 0.1
Eosinophils Absolute: 0.2
Eosinophils Relative: 1
Lymphocytes Relative: 29
Lymphs Abs: 1.4
Lymphs Abs: 1.6
Lymphs Abs: 1.9
Monocytes Relative: 8
Neutro Abs: 2.9
Neutrophils Relative %: 57
Neutrophils Relative %: 61

## 2010-11-23 LAB — COMPREHENSIVE METABOLIC PANEL
ALT: 23
AST: 23
Alkaline Phosphatase: 69
CO2: 29
Calcium: 10.1
Chloride: 98
GFR calc Af Amer: >60
GFR calc non Af Amer: >60
Potassium: 3.5
Sodium: 137

## 2010-11-23 LAB — CBC
MCHC: 33.8
MCHC: 34.1
MCV: 96.6
Platelets: 241
RBC: 4.08
RBC: 4.14
WBC: 4.8
WBC: 5.6
WBC: 6.3

## 2010-11-24 LAB — DIFFERENTIAL
Basophils Absolute: 0
Basophils Relative: 0
Eosinophils Absolute: 0.3
Eosinophils Relative: 4
Lymphocytes Relative: 19
Lymphs Abs: 1.3
Monocytes Absolute: 0.5
Monocytes Relative: 8
Neutro Abs: 4.5
Neutrophils Relative %: 68

## 2010-11-24 LAB — BASIC METABOLIC PANEL WITH GFR
BUN: 5 — ABNORMAL LOW
BUN: 7
CO2: 31
CO2: 34 — ABNORMAL HIGH
Calcium: 9.5
Calcium: 9.5
Chloride: 100
Chloride: 95 — ABNORMAL LOW
Creatinine, Ser: 0.59
Creatinine, Ser: 0.62
GFR calc Af Amer: >60
GFR calc Af Amer: >60
GFR calc non Af Amer: >60
GFR calc non Af Amer: >60
Glucose, Bld: 106 — ABNORMAL HIGH
Glucose, Bld: 110 — ABNORMAL HIGH
Potassium: 3.5
Potassium: 3.6
Sodium: 135
Sodium: 141

## 2010-11-24 LAB — CULTURE, BLOOD (SINGLE)
Culture: NO GROWTH
Report Status: 9062009

## 2010-11-24 LAB — CBC
HCT: 30.5 — ABNORMAL LOW
Hemoglobin: 10.3 — ABNORMAL LOW
Hemoglobin: 11.2 — ABNORMAL LOW
MCHC: 33.6
MCV: 96.8
Platelets: 327
RBC: 3.15 — ABNORMAL LOW
RBC: 3.45 — ABNORMAL LOW
RDW: 17.9 — ABNORMAL HIGH
RDW: 18.5 — ABNORMAL HIGH
WBC: 6.6

## 2010-11-26 LAB — CBC
HCT: 38.1 % (ref 36.0–46.0)
Hemoglobin: 13 g/dL (ref 12.0–15.0)
Hemoglobin: 13.1 g/dL (ref 12.0–15.0)
Hemoglobin: 13.6 g/dL (ref 12.0–15.0)
MCHC: 34.1 g/dL (ref 30.0–36.0)
MCHC: 34.1 g/dL (ref 30.0–36.0)
MCHC: 34.4 g/dL (ref 30.0–36.0)
MCV: 96 fL (ref 78.0–100.0)
MCV: 96 fL (ref 78.0–100.0)
MCV: 96.1 fL (ref 78.0–100.0)
RBC: 3.97 MIL/uL (ref 3.87–5.11)
RBC: 4.17 MIL/uL (ref 3.87–5.11)
RDW: 15.2 % (ref 11.5–15.5)
RDW: 15.5 % (ref 11.5–15.5)
WBC: 6.3 10*3/uL (ref 4.0–10.5)

## 2010-11-26 LAB — DIFFERENTIAL
Basophils Absolute: 0 10*3/uL (ref 0.0–0.1)
Basophils Relative: 1 % (ref 0–1)
Basophils Relative: 1 % (ref 0–1)
Basophils Relative: 1 % (ref 0–1)
Eosinophils Absolute: 0.1 10*3/uL (ref 0.0–0.7)
Eosinophils Relative: 1 % (ref 0–5)
Lymphs Abs: 1.5 10*3/uL (ref 0.7–4.0)
Lymphs Abs: 1.9 10*3/uL (ref 0.7–4.0)
Monocytes Absolute: 0.2 10*3/uL (ref 0.1–1.0)
Monocytes Absolute: 0.3 10*3/uL (ref 0.1–1.0)
Monocytes Relative: 4 % (ref 3–12)
Monocytes Relative: 5 % (ref 3–12)
Monocytes Relative: 7 % (ref 3–12)
Neutro Abs: 3.5 10*3/uL (ref 1.7–7.7)
Neutro Abs: 4.1 10*3/uL (ref 1.7–7.7)
Neutro Abs: 4.3 10*3/uL (ref 1.7–7.7)
Neutrophils Relative %: 60 % (ref 43–77)
Neutrophils Relative %: 69 % (ref 43–77)

## 2010-11-26 LAB — COMPREHENSIVE METABOLIC PANEL
BUN: 9 mg/dL (ref 6–23)
CO2: 28 mEq/L (ref 19–32)
Calcium: 9.5 mg/dL (ref 8.4–10.5)
Creatinine, Ser: 0.83 mg/dL (ref 0.4–1.2)
GFR calc non Af Amer: >60 mL/min (ref >60)
Glucose, Bld: 103 mg/dL — ABNORMAL HIGH (ref 70–99)
Total Protein: 6.6 g/dL (ref 6.0–8.3)

## 2010-11-29 ENCOUNTER — Ambulatory Visit (HOSPITAL_COMMUNITY): Payer: Medicaid Other | Admitting: Oncology

## 2010-12-01 ENCOUNTER — Emergency Department (HOSPITAL_COMMUNITY)
Admission: EM | Admit: 2010-12-01 | Discharge: 2010-12-01 | Discharge status: Against Medical Advice | Payer: Medicaid Other | Attending: Emergency Medicine | Admitting: Emergency Medicine

## 2010-12-02 ENCOUNTER — Telehealth (HOSPITAL_COMMUNITY): Payer: Self-pay | Admitting: Oncology

## 2010-12-02 ENCOUNTER — Other Ambulatory Visit (HOSPITAL_COMMUNITY): Payer: Self-pay | Admitting: Oncology

## 2010-12-02 DIAGNOSIS — C50919 Malignant neoplasm of unspecified site of unspecified female breast: Secondary | ICD-10-CM

## 2010-12-02 DIAGNOSIS — F411 Generalized anxiety disorder: Secondary | ICD-10-CM

## 2010-12-02 DIAGNOSIS — M549 Dorsalgia, unspecified: Secondary | ICD-10-CM

## 2010-12-02 DIAGNOSIS — M62838 Other muscle spasm: Secondary | ICD-10-CM

## 2010-12-02 MED ORDER — CYCLOBENZAPRINE HCL 10 MG PO TABS: 10.0000 mg | ORAL_TABLET | Freq: Two times a day (BID) | ORAL | Status: DC | PRN

## 2010-12-02 MED ORDER — ALPRAZOLAM 0.5 MG PO TABS: 0.5000 mg | ORAL_TABLET | Freq: Three times a day (TID) | ORAL | Status: DC | PRN

## 2010-12-02 MED ORDER — GABAPENTIN 300 MG PO CAPS: 300.0000 mg | ORAL_CAPSULE | Freq: Two times a day (BID) | ORAL | Status: DC

## 2010-12-02 NOTE — Telephone Encounter (Signed)
Patient called.  She reports that she fell the other day and injured her left arm.  She went to the ER in [place].  She reports that she did not have a fracture.  She reports that she is keeping it elevated and using ice for the swelling and pain.    She reports low back muscle spasms which she has had in the past.  I e-scribed 10 mg of Flexeril #30 with no refills.   The patient also needed a refill on her Gabapentin.  I -escribed this for her.  #60 with no refills.   She also needed a refill on her Xanax # 75 with 2 refills.  This was called in to [place].  These will all be delivered

## 2010-12-13 ENCOUNTER — Encounter (HOSPITAL_COMMUNITY): Payer: Self-pay | Admitting: Oncology

## 2010-12-13 ENCOUNTER — Encounter (HOSPITAL_COMMUNITY): Payer: Medicaid Other | Attending: Oncology | Admitting: Oncology

## 2010-12-13 DIAGNOSIS — F419 Anxiety disorder, unspecified: Secondary | ICD-10-CM

## 2010-12-13 DIAGNOSIS — Z171 Estrogen receptor negative status [ER-]: Secondary | ICD-10-CM

## 2010-12-13 DIAGNOSIS — M549 Dorsalgia, unspecified: Secondary | ICD-10-CM

## 2010-12-13 DIAGNOSIS — F411 Generalized anxiety disorder: Secondary | ICD-10-CM

## 2010-12-13 DIAGNOSIS — M62830 Muscle spasm of back: Secondary | ICD-10-CM

## 2010-12-13 DIAGNOSIS — L409 Psoriasis, unspecified: Secondary | ICD-10-CM

## 2010-12-13 DIAGNOSIS — C50919 Malignant neoplasm of unspecified site of unspecified female breast: Secondary | ICD-10-CM | POA: Insufficient documentation

## 2010-12-13 HISTORY — DX: Psoriasis, unspecified: L40.9

## 2010-12-13 HISTORY — DX: Muscle spasm of back: M62.830

## 2010-12-13 NOTE — Patient Instructions (Signed)
[facility]  Discharge Instructions  RECOMMENDATIONS MADE BY THE CONSULTANT AND ANY TEST RESULTS WILL BE SENT TO YOUR REFERRING DOCTOR.     MEDICATIONS PRESCRIBED:RX given for Xanax. Rx called in for steroid cream to pharmacy.   INSTRUCTIONS GIVEN AND DISCUSSED: You need to have a bone scan done. We will get that scheduled for you.  SPECIAL INSTRUCTIONS/FOLLOW-UP: Return to clinic in 3-4 months.   I acknowledge that I have been informed and understand all the instructions given to me and received a copy. I do not have any more questions at this time, but understand that I may call the Specialty Clinic at [facility] at [phone] during business hours should I have any further questions or need assistance in obtaining follow-up care.    __________________________________________  _____________  __________ Signature of Patient or Authorized Representative            Date                   Time    __________________________________________ Nurse's Signature

## 2010-12-13 NOTE — Progress Notes (Addendum)
No primary provider on file. No primary provider on file.  1. Psoriasis    2. Anxiety    3. Back pain  NM Bone Scan Whole Body  4. H/O Stage III Breast cancer  NM Bone Scan Whole Body  5. ANXIETY    6. Back muscle spasm      CURRENT THERAPY:S/P preoperative dose dense AC prior to mastectomy which was on 10/12/2007  INTERVAL HISTORY: [name] 50 y.o. 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  She reports that she continues back muscle spasms. She explains that the muscle relaxant, namely Flexeril, really helped her symptoms. I spent some time and patient going over education regarding the potential addicting and abuse properties of Flexeril. She understands this.  Patient reports she is a prescription for her same next. She reports that she's been having some personal issues at home have really been causing her some anxiety. She reports that her son who is in prison serving a 22 year sentence as weighing on her mind. She explains that he is served 8 years and he is up for parole in 6 more years. She hopes that he will be getting a prison at that point time.  She is also having some problems with her daughter and grandchildren.  The patient also reports a rash noted on her fingers, left knee, and bilateral dorsal aspect of feet.  Oncologically, the patient denies any complaints.  Past Medical History  Diagnosis Date  . COPD (chronic obstructive pulmonary disease) 09/14/2010  . 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 H/O MRSA infection 09/14/2010  . Back muscle spasm 12/13/2010  . Psoriasis 12/13/2010    has TINEA PEDIS; ANXIETY; DEPRESSION; HYPERTENSION; H/O Stage III Breast cancer; COPD (chronic obstructive pulmonary disease); Marijuana abuse in the past; H/O MRSA infection; Back muscle spasm; and Psoriasis on her problem list.     is allergic to codeine and penicillins.  [name] does  not currently have medications on file.  No past surgical history on file.  Denies any headaches, dizziness, double vision, fevers, chills, night sweats, nausea, vomiting, diarrhea, constipation, chest pain, heart palpitations, shortness of breath, blood in stool, black tarry stool, urinary pain, urinary burning, urinary frequency, hematuria.   PHYSICAL EXAMINATION  ECOG PERFORMANCE STATUS: 0 - Asymptomatic  Filed Vitals:   12/13/10 0956  BP: 157/107  Pulse: 109  Temp: 98.2 F (36.8 C)    GENERAL:alert, well nourished, well developed, comfortable, cooperative, crying and obese SKIN: skin color, texture, turgor are normal HEAD: Normocephalic EYES: normal EARS: External ears normal OROPHARYNX:mucous membranes are moist  NECK: supple, trachea midline LYMPH:  no palpable lymphadenopathy, no hepatosplenomegaly BREAST:post-mastectomy site well healed and free of suspicious changes LUNGS: clear to auscultation and percussion HEART: regular rate & rhythm, no murmurs, no gallops, S1 normal and S2 normal ABDOMEN:abdomen soft, non-tender, obese, normal bowel sounds and no hepatosplenomegaly BACK: Back symmetric, no curvature., No CVA tenderness, no tenderness to palpation EXTREMITIES:less then 2 second capillary refill, no joint deformities, effusion, or inflammation, no edema, no clubbing, no cyanosis, positive findings:  Erythematous, thick, plaque noted on right dorsal aspect of foot with white plaques.  Small white plaques noted on left hallux PIP. No pitting of the fingernails noted, but fingernails painted NEURO: alert & oriented x 3 with fluent speech, no focal motor/sensory deficits, gait normal   PATHOLOGY: 1. Breast, left, modified radical mastectomy- high grade invasive  ductal carcinoma, 2.5 cm, clear resection margins, 0/10 lymph nodes for metastatic disease, grade III, No LVI, ER/PR negative, Ki-67 22%, Her2 negative.  2. Right breast, simple mastectomy- changes consistent with  chemotherapy treatment, fibrocystic changes, no malignancy    ASSESSMENT:  1. Back muscle spasm 2. Anxiety 3. Psoriasis 4. Stage III, poorly differentiated, triple negative breast cancer, S/P chemotherapy and mastectomy  PLAN:  1. Xanax 0.5 mg #90 TID PRN anxiety 2. Flexeril #45 BID, PRN muscle spasms 3. Hydrocortisone 1%, 30 g no refills, apply BID-TID to affected areas. 4. Bone scan evaluate for metastatic disease 5. Return in 3-4 months for follow-up. 6. Recommended a good moisturizing lotion to affected areas following bathing.   7. Recommended sleeping in cotton socks after placing moisturizing lotion to affected areas. 8. Continue using Dial soap for bathing.   All questions were answered. The patient knows to call the clinic with any problems, questions or concerns. We can certainly see the patient much sooner if necessary.  The patient and plan discussed with Glenford Peers, MD and he is in agreement with the aforementioned.  More than 50% of the time spent with the patient was utilized for counseling.  Darryl Blumenstein

## 2011-01-04 ENCOUNTER — Other Ambulatory Visit (HOSPITAL_COMMUNITY): Payer: Self-pay | Admitting: Oncology

## 2011-01-04 DIAGNOSIS — C50919 Malignant neoplasm of unspecified site of unspecified female breast: Secondary | ICD-10-CM

## 2011-01-04 DIAGNOSIS — G629 Polyneuropathy, unspecified: Secondary | ICD-10-CM

## 2011-01-04 MED ORDER — GABAPENTIN 300 MG PO CAPS: 300.0000 mg | ORAL_CAPSULE | Freq: Two times a day (BID) | ORAL | Status: DC

## 2011-01-12 ENCOUNTER — Encounter (HOSPITAL_COMMUNITY): Payer: Medicaid Other

## 2011-01-12 ENCOUNTER — Encounter (HOSPITAL_COMMUNITY): Admission: RE | Admit: 2011-01-12 | Payer: Medicaid Other | Source: Ambulatory Visit

## 2011-01-26 ENCOUNTER — Telehealth (HOSPITAL_COMMUNITY): Payer: Self-pay | Admitting: Oncology

## 2011-01-26 ENCOUNTER — Other Ambulatory Visit (HOSPITAL_COMMUNITY): Payer: Self-pay | Admitting: Oncology

## 2011-01-26 DIAGNOSIS — M62830 Muscle spasm of back: Secondary | ICD-10-CM

## 2011-01-26 DIAGNOSIS — M549 Dorsalgia, unspecified: Secondary | ICD-10-CM

## 2011-01-26 DIAGNOSIS — M62838 Other muscle spasm: Secondary | ICD-10-CM

## 2011-01-26 MED ORDER — CYCLOBENZAPRINE HCL 10 MG PO TABS: 10.0000 mg | ORAL_TABLET | Freq: Two times a day (BID) | ORAL | Status: DC | PRN

## 2011-01-26 NOTE — Telephone Encounter (Signed)
Returning telephone call.  Mercades reports she continues to have back pain although on previous examinations is noted that she has lower back muscle spasms. She was treated with anti-inflammatories along with Flexeril for muscle relaxants. She is on these medications are really helped her. However every time we discontinue these medications, these muscle spasms returned.  She is scheduled for a bone scan on 02/01/2011. I've encouraged her to keep this appointment.  I will give her a refill on her Flexeril and when she is due for a refill on her Xanax this will be refilled as well.  KEFALAS,THOMAS

## 2011-02-01 ENCOUNTER — Encounter (HOSPITAL_COMMUNITY): Payer: Self-pay

## 2011-02-01 ENCOUNTER — Encounter (HOSPITAL_COMMUNITY)
Admission: RE | Admit: 2011-02-01 | Discharge: 2011-02-01 | Disposition: A | Discharge status: Home / Self Care | Payer: Medicaid Other | Source: Ambulatory Visit | Attending: Oncology | Admitting: Oncology

## 2011-02-01 DIAGNOSIS — C50919 Malignant neoplasm of unspecified site of unspecified female breast: Secondary | ICD-10-CM

## 2011-02-01 DIAGNOSIS — M549 Dorsalgia, unspecified: Secondary | ICD-10-CM | POA: Insufficient documentation

## 2011-02-01 DIAGNOSIS — Z853 Personal history of malignant neoplasm of breast: Secondary | ICD-10-CM | POA: Insufficient documentation

## 2011-02-01 HISTORY — DX: Malignant (primary) neoplasm, unspecified: C80.1

## 2011-02-01 MED ORDER — TECHNETIUM TC 99M MEDRONATE IV KIT
25.0000 | PACK | Freq: Once | INTRAVENOUS | Status: AC | PRN
  Administered 2011-02-01: 24.9 via INTRAVENOUS

## 2011-02-08 ENCOUNTER — Other Ambulatory Visit (HOSPITAL_COMMUNITY): Payer: Self-pay | Admitting: Oncology

## 2011-02-08 DIAGNOSIS — I10 Essential (primary) hypertension: Secondary | ICD-10-CM

## 2011-02-08 DIAGNOSIS — M62830 Muscle spasm of back: Secondary | ICD-10-CM

## 2011-02-08 MED ORDER — NAPROXEN 500 MG PO TABS: 500.0000 mg | ORAL_TABLET | Freq: Two times a day (BID) | ORAL | Status: DC

## 2011-02-08 MED ORDER — LISINOPRIL 10 MG PO TABS: 10.0000 mg | ORAL_TABLET | Freq: Every day | ORAL | Status: DC

## 2011-02-18 ENCOUNTER — Telehealth (HOSPITAL_COMMUNITY): Payer: Self-pay | Admitting: *Deleted

## 2011-02-25 ENCOUNTER — Other Ambulatory Visit (HOSPITAL_COMMUNITY): Payer: Self-pay | Admitting: Oncology

## 2011-02-25 ENCOUNTER — Telehealth (HOSPITAL_COMMUNITY): Payer: Self-pay | Admitting: Oncology

## 2011-02-25 MED ORDER — DOXYCYCLINE HYCLATE 100 MG PO TABS: 100.0000 mg | ORAL_TABLET | Freq: Two times a day (BID) | ORAL | Status: AC

## 2011-02-25 NOTE — Telephone Encounter (Signed)
Patient called complaining of a "golf sized boil" in her groin.  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  She denies any fevers and chills.  She reports that it is painful.    Will E-scribe her Doxycycline 100 mg PO BID for 10 days.  Patient is to call the clinic Monday if worse or no better.  At that time we will make her an appointment for further evaluation.

## 2011-03-08 ENCOUNTER — Telehealth (HOSPITAL_COMMUNITY): Payer: Self-pay | Admitting: Oncology

## 2011-03-08 ENCOUNTER — Other Ambulatory Visit (HOSPITAL_COMMUNITY): Payer: Self-pay | Admitting: Oncology

## 2011-03-08 DIAGNOSIS — A4902 Methicillin resistant Staphylococcus aureus infection, unspecified site: Secondary | ICD-10-CM

## 2011-03-08 DIAGNOSIS — R52 Pain, unspecified: Secondary | ICD-10-CM

## 2011-03-08 MED ORDER — DOXYCYCLINE HYCLATE 100 MG PO TABS: 100.0000 mg | ORAL_TABLET | Freq: Two times a day (BID) | ORAL | Status: AC

## 2011-03-08 MED ORDER — TRAMADOL HCL 50 MG PO TABS: 50.0000 mg | ORAL_TABLET | Freq: Four times a day (QID) | ORAL | Status: AC | PRN

## 2011-03-08 NOTE — Telephone Encounter (Signed)
Patient has opened her boil in her groin.  She reports that it is very painful and she now has more boils.    I recommended the patient present to the ER and she adamantly refused.  She will complete her antibiotic therapy.  She requested some pain medication for her pain associated with the "popped" boil in her groin.  She requested methadone, which I declined, and diluadid which I declined.   I e-scribed her Tramadol 50 mg #12 with no refills to be delivered to [facility].  She has arranged transportation for next week to bring her to the clinic.  We will see her next week.  She cannot make it this week.  The patient was informed to report to the ER with any fevers, chills, SOB, chest pain,night sweats, and other signs and symptoms of infection and bacteremia.   Christyanna Mckeon

## 2011-03-08 NOTE — Telephone Encounter (Signed)
[name] called and reported that the Tramadol I prescribed will not work for her.  I refused to give her any other types of pain medications.  I suggested over the counter tylenol or aleve.   She also is due to run out of her antibiotic.  In light of her refusal to report to the ER and her inability to come to the clinic until 11 more days, I e-scribed her Doxycycline 100 mg BID x 11 more days #22.   The patient also reports that she ran out of her Gabapentin early because she took more than she was prescribed.  As a result, insurance will not pay for a refill because it is too early.  Unfortunately, she will have to wait 12 more days until she can get that refilled.   [name]

## 2011-03-18 ENCOUNTER — Ambulatory Visit (HOSPITAL_COMMUNITY): Payer: Medicaid Other | Admitting: Oncology

## 2011-03-21 ENCOUNTER — Other Ambulatory Visit (HOSPITAL_COMMUNITY): Payer: Self-pay | Admitting: Oncology

## 2011-03-21 ENCOUNTER — Telehealth (HOSPITAL_COMMUNITY): Payer: Self-pay | Admitting: Oncology

## 2011-03-21 DIAGNOSIS — A4902 Methicillin resistant Staphylococcus aureus infection, unspecified site: Secondary | ICD-10-CM

## 2011-03-21 MED ORDER — SULFAMETHOXAZOLE-TRIMETHOPRIM 800-160 MG PO TABS: 1.0000 | ORAL_TABLET | Freq: Three times a day (TID) | ORAL | Status: AC

## 2011-03-21 MED ORDER — DOXYCYCLINE HYCLATE 100 MG PO TABS: 100.0000 mg | ORAL_TABLET | Freq: Two times a day (BID) | ORAL | Status: AC

## 2011-03-21 NOTE — Telephone Encounter (Signed)
Patient reports that "boils" are worse despite doxycycline therapy.  She reports that she cut one open and it had a bad smell to it.  She has one superior to genitals and this is the one she opened with a pocket knife that she has boiled.   She missed her appointment on Friday for this issue.   She also requests a refill on her Xanax.  I called [facility] and she just received an Rx for Xanax #90 on 03/07/11.  Talked to Dr. Mariel Sleet and he recommended Doxycycline 100 mg PO BID x 8 days and Septra DS #24 TID x 8 days.  This was called in to [facility] and I called the patient to let her know.  I recommended good hygeine with Dial soap.  Message left for the patient on the return telephone call.   KEFALAS,THOMAS

## 2011-03-31 ENCOUNTER — Telehealth (HOSPITAL_COMMUNITY): Payer: Self-pay | Admitting: *Deleted

## 2011-04-13 ENCOUNTER — Encounter (HOSPITAL_COMMUNITY): Payer: Medicaid Other | Attending: Oncology | Admitting: Oncology

## 2011-04-13 VITALS — BP 178/99 | HR 92 | Temp 98.1°F | Ht 65.0 in | Wt 216.0 lb

## 2011-04-13 DIAGNOSIS — C50419 Malignant neoplasm of upper-outer quadrant of unspecified female breast: Secondary | ICD-10-CM

## 2011-04-13 DIAGNOSIS — J4489 Other specified chronic obstructive pulmonary disease: Secondary | ICD-10-CM | POA: Insufficient documentation

## 2011-04-13 DIAGNOSIS — M545 Low back pain, unspecified: Secondary | ICD-10-CM | POA: Insufficient documentation

## 2011-04-13 DIAGNOSIS — Z9010 Acquired absence of unspecified breast and nipple: Secondary | ICD-10-CM

## 2011-04-13 DIAGNOSIS — J449 Chronic obstructive pulmonary disease, unspecified: Secondary | ICD-10-CM | POA: Insufficient documentation

## 2011-04-13 DIAGNOSIS — F1210 Cannabis abuse, uncomplicated: Secondary | ICD-10-CM | POA: Insufficient documentation

## 2011-04-13 DIAGNOSIS — F411 Generalized anxiety disorder: Secondary | ICD-10-CM | POA: Insufficient documentation

## 2011-04-13 DIAGNOSIS — Z09 Encounter for follow-up examination after completed treatment for conditions other than malignant neoplasm: Secondary | ICD-10-CM | POA: Insufficient documentation

## 2011-04-13 DIAGNOSIS — C50919 Malignant neoplasm of unspecified site of unspecified female breast: Secondary | ICD-10-CM

## 2011-04-13 DIAGNOSIS — Z853 Personal history of malignant neoplasm of breast: Secondary | ICD-10-CM | POA: Insufficient documentation

## 2011-04-13 DIAGNOSIS — F17200 Nicotine dependence, unspecified, uncomplicated: Secondary | ICD-10-CM | POA: Insufficient documentation

## 2011-04-13 DIAGNOSIS — L408 Other psoriasis: Secondary | ICD-10-CM | POA: Insufficient documentation

## 2011-04-13 LAB — CBC
HCT: 44.4 % (ref 36.0–46.0)
MCH: 32.1 pg (ref 26.0–34.0)
MCV: 94.5 fL (ref 78.0–100.0)
Platelets: 254 10*3/uL (ref 150–400)
RDW: 14.3 % (ref 11.5–15.5)

## 2011-04-13 LAB — DIFFERENTIAL
Basophils Absolute: 0 10*3/uL (ref 0.0–0.1)
Lymphocytes Relative: 26 % (ref 12–46)
Neutro Abs: 5.6 10*3/uL (ref 1.7–7.7)
Neutrophils Relative %: 67 % (ref 43–77)

## 2011-04-13 LAB — COMPREHENSIVE METABOLIC PANEL
CO2: 30 mEq/L (ref 19–32)
Calcium: 10.8 mg/dL — ABNORMAL HIGH (ref 8.4–10.5)
Creatinine, Ser: 0.69 mg/dL (ref 0.50–1.10)
GFR calc Af Amer: >90 mL/min (ref >90)
GFR calc non Af Amer: >90 mL/min (ref >90)
Glucose, Bld: 147 mg/dL — ABNORMAL HIGH (ref 70–99)

## 2011-04-13 NOTE — Patient Instructions (Signed)
[name] [place]  [id number] Feb 03, 1961   [facility]  Discharge Instructions  RECOMMENDATIONS MADE BY THE CONSULTANT AND ANY TEST RESULTS WILL BE SENT TO YOUR REFERRING DOCTOR.   EXAM FINDINGS BY MD TODAY AND SIGNS AND SYMPTOMS TO REPORT TO CLINIC OR PRIMARY MD: You are doing well  MEDICATIONS PRESCRIBED: none   INSTRUCTIONS GIVEN AND DISCUSSED: Report any new lumps, bone pain or shortness of breath.  SPECIAL INSTRUCTIONS/FOLLOW-UP: Lab work Needed today and Return to Clinic in 6 months   I acknowledge that I have been informed and understand all the instructions given to me and received a copy. I do not have any more questions at this time, but understand that I may call the Specialty Clinic at [facility] at [phone] during business hours should I have any further questions or need assistance in obtaining follow-up care.    __________________________________________  _____________  __________ Signature of Patient or Authorized Representative            Date                   Time    __________________________________________ Nurse's Signature
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
## 2011-04-15 ENCOUNTER — Ambulatory Visit (HOSPITAL_COMMUNITY): Payer: Medicaid Other | Admitting: Oncology

## 2011-04-27 ENCOUNTER — Other Ambulatory Visit (HOSPITAL_COMMUNITY): Payer: Self-pay | Admitting: Oncology

## 2011-04-27 DIAGNOSIS — F411 Generalized anxiety disorder: Secondary | ICD-10-CM

## 2011-04-27 MED ORDER — ALPRAZOLAM 0.5 MG PO TABS: 0.5000 mg | ORAL_TABLET | Freq: Three times a day (TID) | ORAL | Status: DC | PRN

## 2011-05-29 NOTE — H&P (Signed)
  NTS SOAP Note  Vital Signs:  Vitals as of: 05/17/2011: Systolic 189: Diastolic 113: Heart Rate 100: Temp 97.44F: Height 82ft 8in: Weight 218Lbs 0 Ounces: OFC 0in: Respiratory Rate 0: O2 Saturation 0: Pain Level 0: BMI 33  BMI : 33.15 kg/m2  Subjective: This 51 Years 3 Months old Female presents forof Patient has a Port-A-Cath following treatment for a left-sided breast cancer.  Patient has completed all of her regimens. She no longer utilizes her Port-A-Cath. He was advised patient have this removed. She has not had any symptoms or problems with the Port-A-Cath. Port-A-Cath was placed several years ago in 2009 by Dr. Lovell Sheehan.  Review of Symptoms:  Constitutional:unremarkable Head:unremarkable Eyes:unremarkable Nose/Mouth/Throat:unremarkable Cardiovascular:unremarkable Respiratory:unremarkable Gastrointestinal:unremarkable Genitourinary:unremarkable Musculoskeletal:unremarkable Skin:unremarkable Breast:unremarkable Hematolgic/Lymphatic:unremarkable Allergic/Immunologic:unremarkable   Past Medical History:Reviewed   Past Medical History  Surgical History: Left simple mastectomy. Port-A-Cath insertion. Hemorrhoidectomy. Total abdominal hysterectomy. Medical Problems: COPD, hypertension Allergies: Penicillin, codeine Medications: lisinopril, hydrochlorothiazide   Social History:Reviewed  Social History  Preferred Language: English ([place]) Race:  White Ethnicity: Not Hispanic / Latino Age: 51 Years 7 Months Marital Status:  S Alcohol: Occasional Recreational drug(s): none   Smoking Status: Current every day smoker reviewed on 05/18/2011 Started Date: 02/21/1981 Packs per day: 1.00   Family History:Reviewed   Family History      Not Available    Objective Information: General:Well appearing, well nourished in no distress.Obese Skin:no rash or prominent lesions. There is a palpable port under the skin  in the right upper chest in the Sub- clavicular region. Head:Atraumatic; no masses; no abnormalities Eyes:conjunctiva clear, EOM intact, PERRL Mouth:Mucous membranes moist, no mucosal lesions. Neck:Supple without lymphadenopathy.  Heart:RRR, no murmur Lungs:CTA bilaterally, no wheezes, rhonchi, rales.  Breathing unlabored. Abdomen:Soft, NT/ND, no HSM, no masses.  Assessment:  Diagnosis &amp; Procedure: DiagnosisCode: 174.9, ProcedureCode: 16109,    Plan: History of breast cancer, Port-A-Cath. Options were discussed with the patient. Plans will be to remove the port in the minor procedure room. This will be scheduled the patient's convenience.  Patient Education:Alternative treatments to surgery were discussed with patient (and family).Risks and benefits  of procedure were fully explained to the patient (and family) who gave informed consent. Patient/family questions were addressed.  Follow-up:PRN                                             Active Diagnosis and Procedures: 174.9 Malignant neoplasm of breast (female), unspecified   [phone] - OFFICE OUTPATIENT VISIT 10 MINUTES              This note has been electronically signed by Fabio Bering MD 05/18/2011 12:50 PM Fabio Bering MD 05/18/2011 12:51 PM

## 2011-05-31 ENCOUNTER — Telehealth (HOSPITAL_COMMUNITY): Payer: Self-pay | Admitting: *Deleted

## 2011-05-31 ENCOUNTER — Other Ambulatory Visit (HOSPITAL_COMMUNITY): Payer: Self-pay | Admitting: Oncology

## 2011-05-31 DIAGNOSIS — I10 Essential (primary) hypertension: Secondary | ICD-10-CM

## 2011-05-31 MED ORDER — LISINOPRIL 10 MG PO TABS: 20.0000 mg | ORAL_TABLET | Freq: Every day | ORAL | Status: DC

## 2011-06-01 ENCOUNTER — Encounter (HOSPITAL_COMMUNITY): Payer: Self-pay | Admitting: *Deleted

## 2011-06-01 ENCOUNTER — Ambulatory Visit (HOSPITAL_COMMUNITY)
Admission: RE | Admit: 2011-06-01 | Discharge: 2011-06-01 | Disposition: A | Discharge status: Home / Self Care | Payer: Medicaid Other | Source: Ambulatory Visit | Attending: General Surgery | Admitting: General Surgery

## 2011-06-01 ENCOUNTER — Emergency Department (HOSPITAL_COMMUNITY)
Admission: EM | Admit: 2011-06-01 | Discharge: 2011-06-01 | Disposition: A | Discharge status: Home / Self Care | Payer: Medicaid Other | Attending: Emergency Medicine | Admitting: Emergency Medicine

## 2011-06-01 ENCOUNTER — Emergency Department (HOSPITAL_COMMUNITY): Payer: Medicaid Other

## 2011-06-01 ENCOUNTER — Encounter (HOSPITAL_COMMUNITY)
Admission: RE | Disposition: A | Discharge status: Home / Self Care | Payer: Self-pay | Source: Ambulatory Visit | Attending: General Surgery

## 2011-06-01 DIAGNOSIS — I10 Essential (primary) hypertension: Secondary | ICD-10-CM | POA: Insufficient documentation

## 2011-06-01 DIAGNOSIS — C50919 Malignant neoplasm of unspecified site of unspecified female breast: Secondary | ICD-10-CM

## 2011-06-01 DIAGNOSIS — M549 Dorsalgia, unspecified: Secondary | ICD-10-CM

## 2011-06-01 DIAGNOSIS — J449 Chronic obstructive pulmonary disease, unspecified: Secondary | ICD-10-CM | POA: Insufficient documentation

## 2011-06-01 DIAGNOSIS — M545 Low back pain, unspecified: Secondary | ICD-10-CM | POA: Insufficient documentation

## 2011-06-01 DIAGNOSIS — J4489 Other specified chronic obstructive pulmonary disease: Secondary | ICD-10-CM | POA: Insufficient documentation

## 2011-06-01 DIAGNOSIS — Z8614 Personal history of Methicillin resistant Staphylococcus aureus infection: Secondary | ICD-10-CM | POA: Insufficient documentation

## 2011-06-01 DIAGNOSIS — Z853 Personal history of malignant neoplasm of breast: Secondary | ICD-10-CM | POA: Insufficient documentation

## 2011-06-01 DIAGNOSIS — Z9010 Acquired absence of unspecified breast and nipple: Secondary | ICD-10-CM | POA: Insufficient documentation

## 2011-06-01 DIAGNOSIS — Z79899 Other long term (current) drug therapy: Secondary | ICD-10-CM | POA: Insufficient documentation

## 2011-06-01 DIAGNOSIS — Z452 Encounter for adjustment and management of vascular access device: Secondary | ICD-10-CM | POA: Insufficient documentation

## 2011-06-01 HISTORY — PX: PORT-A-CATH REMOVAL: SHX5289

## 2011-06-01 LAB — URINALYSIS, ROUTINE W REFLEX MICROSCOPIC
Leukocytes, UA: NEGATIVE
Protein, ur: NEGATIVE mg/dL
Specific Gravity, Urine: >1.03 — ABNORMAL HIGH (ref 1.005–1.030)
Urobilinogen, UA: 0.2 mg/dL (ref 0.0–1.0)

## 2011-06-01 LAB — URINE MICROSCOPIC-ADD ON

## 2011-06-01 SURGERY — REMOVAL PORT-A-CATH
Anesthesia: LOCAL

## 2011-06-01 MED ORDER — LIDOCAINE HCL (PF) 1 % IJ SOLN
INTRAMUSCULAR | Status: AC
  Filled 2011-06-01: qty 30

## 2011-06-01 MED ORDER — OXYCODONE-ACETAMINOPHEN 5-325 MG PO TABS
2.0000 | ORAL_TABLET | Freq: Once | ORAL | Status: AC
  Administered 2011-06-01: 2 via ORAL
  Filled 2011-06-01: qty 2

## 2011-06-01 MED ORDER — HYDROCODONE-ACETAMINOPHEN 5-325 MG PO TABS: 1.0000 | ORAL_TABLET | ORAL | Status: AC | PRN

## 2011-06-01 MED ORDER — HYDROMORPHONE HCL PF 1 MG/ML IJ SOLN
1.0000 mg | Freq: Once | INTRAMUSCULAR | Status: AC
  Administered 2011-06-01: 1 mg via INTRAVENOUS
  Filled 2011-06-01: qty 1

## 2011-06-01 MED ORDER — LIDOCAINE HCL (PF) 1 % IJ SOLN
INTRAMUSCULAR | Status: DC | PRN
  Administered 2011-06-01: 25 mL

## 2011-06-01 MED ORDER — ONDANSETRON 8 MG PO TBDP: 8.0000 mg | ORAL_TABLET | Freq: Three times a day (TID) | ORAL | Status: AC | PRN

## 2011-06-01 MED ORDER — OXYCODONE-ACETAMINOPHEN 5-325 MG PO TABS: 1.0000 | ORAL_TABLET | ORAL | Status: AC | PRN

## 2011-06-01 MED ORDER — ONDANSETRON HCL 4 MG/2ML IJ SOLN
4.0000 mg | Freq: Once | INTRAMUSCULAR | Status: AC
  Administered 2011-06-01: 4 mg via INTRAVENOUS
  Filled 2011-06-01: qty 2

## 2011-06-01 SURGICAL SUPPLY — 22 items
BAG HAMPER (MISCELLANEOUS) ×2 IMPLANT
CLOTH BEACON ORANGE TIMEOUT ST (SAFETY) ×2 IMPLANT
COVER SURGICAL LIGHT HANDLE (MISCELLANEOUS) ×4 IMPLANT
DURAPREP 26ML APPLICATOR (WOUND CARE) ×2 IMPLANT
ELECT REM PT RETURN 9FT ADLT (ELECTROSURGICAL) ×2
ELECTRODE REM PT RTRN 9FT ADLT (ELECTROSURGICAL) ×1 IMPLANT
GLOVE BIOGEL PI IND STRL 7.5 (GLOVE) ×1 IMPLANT
GLOVE BIOGEL PI INDICATOR 7.5 (GLOVE) ×1
GOWN STRL REIN XL XLG (GOWN DISPOSABLE) ×6 IMPLANT
KIT ROOM TURNOVER APOR (KITS) ×2 IMPLANT
MANIFOLD NEPTUNE II (INSTRUMENTS) ×2 IMPLANT
NDL HYPO 25X1 1.5 SAFETY (NEEDLE) ×1 IMPLANT
NEEDLE HYPO 25X1 1.5 SAFETY (NEEDLE) ×2 IMPLANT
NS IRRIG 1000ML POUR BTL (IV SOLUTION) ×2 IMPLANT
PACK MINOR (CUSTOM PROCEDURE TRAY) ×2 IMPLANT
PAD ARMBOARD 7.5X6 YLW CONV (MISCELLANEOUS) ×2 IMPLANT
SET BASIN LINEN APH (SET/KITS/TRAYS/PACK) ×2 IMPLANT
SPONGE GAUZE 2X2 8PLY STRL LF (GAUZE/BANDAGES/DRESSINGS) ×2 IMPLANT
STRIP CLOSURE SKIN 1/4X3 (GAUZE/BANDAGES/DRESSINGS) ×2 IMPLANT
SUT MNCRL AB 4-0 PS2 18 (SUTURE) ×2 IMPLANT
SYR CONTROL 10ML LL (SYRINGE) ×2 IMPLANT
TOWEL OR 17X26 4PK STRL BLUE (TOWEL DISPOSABLE) ×2 IMPLANT

## 2011-06-01 NOTE — Op Note (Signed)
Patient:  SHANTINIQUE PICAZO [place]  DOB:  1960-04-14  MRN:  [id number]   Preop Diagnosis:  Breast cancer  Postop Diagnosis:  The same  Procedure:  Removal of Port-A-Cath  Surgeon:  Dr. Tilford Pillar  Anes:  Percent lidocaine plain  Indications:  Patient is a 51 year old female with a history of breast cancer who underwent a bilateral mastectomy. She has completed all regiments of chemotherapy and cancer treatment. He does have a previously placed port and wishes this to be removed. Risks benefits alternatives of removal for discussed. Consent was obtained.  Procedure note:  Patient was taken to the minor procedure room was placed in supine position on the cart and her right chest wall was prepped with chlorhexidine solution. Drapes are placed. Local anesthetic was instilled. A 15 blade scalpel was utilized to create the initial skin incision. Blunt dissection was utilized to dissect down to the port. The port was freed from its capsular attachments circumferentially. It was delivered from the wound and removed in total. Pressure was held underneath the clavicle during the removal. Pressure was held for adequate hemostasis. The wound is irrigated. A 4-0 Monocryl was utilized a running subcuticular suture to reapproximate the skin edges. The skin was washed dried moist dry towel. Benzoin is applied around incision half-inch Steri-Strips are placed. The drapes removed all sharps are disposed of in proper accordance. Patient tolerated procedure extremely well.  Complications:  None  EBL:  Minimal  Specimen:  None

## 2011-06-01 NOTE — ED Provider Notes (Signed)
History    This chart was scribed for Natalie Gaskins, MD by Brooks Sailors. The patient was seen in room APA02/APA02. Patient's care was started at 1329.  CSN: [id number]  Arrival date & time 06/01/11  1329   First MD Initiated Contact with Patient 06/01/11 1412      Chief Complaint  Patient presents with  . Back Pain     HPI  [name] is a 51 y.o. female who presents to the Emergency Department complaining of constant severe lower back pain onset a few days ago. Patient states that her back will just "go out" and that she has tried to control the pain with Aleve.  No focal weakness reported  She reports she frequently cares for her grandchildren and this causes strain on her back.  Pt denies urinary retention/incontinence and no fecal incontinence reported.  No h/o back surgery.  She reports long h/o back pain. Patient denies injury to the back, fever, vomiting, dysuria, and numbness in extremities. History of breast cancer.  She reports she was at hospital today to have portacath removed She is not on active chemo/radiation therapy  Past Medical History  Diagnosis Date  . COPD (chronic obstructive pulmonary disease) 09/14/2010  . 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 H/O MRSA infection 09/14/2010  . Back muscle spasm 12/13/2010  . Psoriasis 12/13/2010  . Cancer   . Asthma     Past Surgical History  Procedure Date  . Portacath insertion   . Mastectomy     left  . Abdominal hysterectomy   . Cesarean section     x7    No family history on file.  History  Substance Use Topics  . Smoking status: Not on file  . Smokeless tobacco: Not on file  . Alcohol Use:     OB History    Grav Para Term Preterm Abortions TAB SAB Ect Mult Living                  Review of Systems A complete 10 system review of systems was obtained and all systems are negative except as noted in the HPI and PMH.   Allergies  Codeine and Penicillins  Home Medications    Current Outpatient Rx  Name Route Sig Dispense Refill  . ALPRAZOLAM 0.5 MG PO TABS Oral Take 1 tablet (0.5 mg total) by mouth 3 (three) times daily as needed for sleep or anxiety. 90 tablet 1  . CYCLOBENZAPRINE HCL 10 MG PO TABS Oral Take 1 tablet (10 mg total) by mouth 2 (two) times daily as needed for muscle spasms. 30 tablet 0  . DICLOFENAC SODIUM 50 MG PO TBEC Oral Take 1 tablet (50 mg total) by mouth 3 (three) times daily. 45 tablet 0  . GABAPENTIN 300 MG PO CAPS Oral Take 1 capsule (300 mg total) by mouth 2 (two) times daily. 60 capsule 0    Please deliver to patient  . HYDROCODONE-ACETAMINOPHEN 5-325 MG PO TABS Oral Take 1-2 tablets by mouth every 4 (four) hours as needed for pain. 15 tablet 0  . LISINOPRIL 10 MG PO TABS Oral Take 2 tablets (20 mg total) by mouth daily. 60 tablet 3    Please deliver  . MINOCYCLINE HCL 100 MG PO CAPS Oral Take 100 mg by mouth 2 (two) times daily.      Marland Kitchen NAPROXEN 500 MG PO TABS Oral Take 1 tablet (500 mg total) by mouth 2 (two) times daily with a  meal. 40 tablet 0    Please deliver    BP 156/100  Pulse 113  Temp(Src) 98.1 F (36.7 C) (Oral)  Resp 20  Ht 5\' 9"  (1.753 m)  Wt 219 lb (99.338 kg)  BMI 32.34 kg/m2  SpO2 99%  Physical Exam CONSTITUTIONAL: Well developed/well nourished HEAD AND FACE: Normocephalic/atraumatic EYES: EOMI/PERRL ENMT: Mucous membranes moist NECK: supple no meningeal signs SPINE:Right lumbar paraspinal tenderness., No bruising/crepitance/stepoffs noted to spine, no spinal tenderness CV: S1/S2 noted, no murmurs/rubs/gallops noted LUNGS: Lungs are clear to auscultation bilaterally, no apparent distress ABDOMEN: soft, nontender, no rebound or guarding GU:no cva tenderness NEURO:Awake/alert, equal distal motor: hip flexion/knee flexion/extension, ankle dorsi/plantar flexion, great toe extension intact bilaterally, no clonus bilaterally, plantar reflex appropriate, no apparent sensory deficit in any dermatome.  Equal  patellar/achilles reflex noted.  Pt is able to ambulate. EXTREMITIES: pulses normal, full ROM SKIN: warm, color normal PSYCH: no abnormalities of mood noted  ED Course  Procedures  DIAGNOSTIC STUDIES: Oxygen Saturation is 99% on room air, normal by my interpretation.    COORDINATION OF CARE: 3:15PM Patient informed of current plan for treatment and evaluation and agrees with plan at this time. Pain medicine and x-ray ordered.    Pt improved, ambulatory, no neuro deficits.  Imaging negative.  No red flags on history/exam.  U/a shows hematuria though she has had this previously, and story not c/w nephrolithiasis I feel she is safe for d/c home  The patient appears reasonably screened and/or stabilized for discharge and I doubt any other medical condition or other [facility] requiring further screening, evaluation, or treatment in the ED at this time prior to discharge.     MDM  Nursing notes reviewed and considered in documentation Patient maintained in spinal precautions/logroll utilized Previous records reviewed and considered     I personally performed the services described in this documentation, which was scribed in my presence. The recorded information has been reviewed and considered.        Natalie Gaskins, MD 06/01/11 2157

## 2011-06-01 NOTE — ED Notes (Signed)
Low back pain . 

## 2011-06-01 NOTE — Discharge Instructions (Signed)
Wound Care Wound care helps prevent pain and infection.  You may need a tetanus shot if:  You cannot remember when you had your last tetanus shot.   You have never had a tetanus shot.   The injury broke your skin.  If you need a tetanus shot and you choose not to have one, you may get tetanus. Sickness from tetanus can be serious. HOME CARE   Only take medicine as told by your doctor.   Clean the wound daily with mild soap and water.   Change any bandages (dressings) as told by your doctor.   Put medicated cream and a bandage on the wound as told by your doctor.   Change the bandage if it gets wet, dirty, or starts to smell.   Take showers. Do not take baths, swim, or do anything that puts your wound under water.   Rest and raise (elevate) the wound until the pain and puffiness (swelling) are better.   Keep all doctor visits as told.  GET HELP RIGHT AWAY IF:   Yellowish-white fluid (pus) comes from the wound.   Medicine does not lessen your pain.   There is a red streak going away from the wound.   You cannot move your finger or toe.   You have a fever.  MAKE SURE YOU:   Understand these instructions.   Will watch your condition.   Will get help right away if you are not doing well or get worse.  Document Released: 11/17/2007 Document Revised: 01/27/2011 Document Reviewed: 06/13/2010 [employer] Patient Information 2012 [employer]. 

## 2011-06-01 NOTE — Discharge Instructions (Signed)

## 2011-06-03 ENCOUNTER — Other Ambulatory Visit (HOSPITAL_COMMUNITY): Payer: Self-pay | Admitting: Oncology

## 2011-06-03 ENCOUNTER — Telehealth (HOSPITAL_COMMUNITY): Payer: Self-pay | Admitting: Oncology

## 2011-06-03 DIAGNOSIS — M549 Dorsalgia, unspecified: Secondary | ICD-10-CM

## 2011-06-03 DIAGNOSIS — M62838 Other muscle spasm: Secondary | ICD-10-CM

## 2011-06-03 MED ORDER — CYCLOBENZAPRINE HCL 10 MG PO TABS: 10.0000 mg | ORAL_TABLET | Freq: Two times a day (BID) | ORAL | Status: DC | PRN

## 2011-06-03 NOTE — Telephone Encounter (Signed)
Patient was seen by Dr. Leticia Penna to have her Port-A-Cath removed. He prescribed patient some hydrocodone tablets to help her with any discomfort she may have postoperatively.  The patient then on the same day, 06/01/2011, report of the emergency Department with constant, severe low back pain with an onset just a few days ago. She was given Percocet 15 tablets following this emergency room visit. The patient reports that after leaving the emergency department, she actually through all of her prescriptions the paperwork away. She has been unable to find her prescriptions. She asked if I would give her some pain medicine. I have refused to provide the patient with pain medicine for this problem. I did give her some Flexeril 10 mg tablets if she can take twice a day. #30 with 0 refills. I E. scribed this medication and in the comment section asked for a pharmacy to deliver the medication on Monday for the patient.  Kadian Barcellos

## 2011-06-03 NOTE — Op Note (Signed)
Patient:  [name]  DOB:  1960/05/10  MRN:  [id number]   Preop Diagnosis:  History of breast cancer  Postop Diagnosis:  The same  Procedure:  Removal of Port-A-Cath  Surgeon:  Dr. Tilford Pillar  Anes:  1% lidocaine plain  Indications:  Patient is a 51 year old female with a history of bilateral mastectomy for breast cancer. She had a port placed in a larger required. She presented to the office for recommendations removal. Risks benefits alternatives were discussed. Patient consented for the planned procedure.  Procedure note:  Patient was taken to the minor procedure room. Her right chest was prepped with chlorhexidine solution. Local anesthetic was instilled of the drapes are placed. As small skin incision was created around the patient's previous chest. Her. The dissection down to the port was carried out sharply. The port was identified and was grasped with a Coker clamp. The dermis capsule surrounding the port was cut with the 15 blade scalpel allowing the port to be freed. The port was removed without difficulty pressure was held under the clavicle. Once hemostasis was obtained the area was irrigated. A 3 ovarica was utilized to reapproximate the deep subcuticular tissue. A 4-0 Monocryl was utilized reapproximate skin edges in a running subcuticular suture. The skin was washed dried moist her towel. Benzoin is applied. Half-inch are suture placed. The patient tolerated procedure well. All sharps are disposed of in proper accordance.  Complications:  None  EBL:  Scant  Specimen:  None

## 2011-06-06 ENCOUNTER — Telehealth (HOSPITAL_COMMUNITY): Payer: Self-pay | Admitting: Oncology
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
## 2011-06-08 ENCOUNTER — Encounter (HOSPITAL_COMMUNITY): Payer: Self-pay | Admitting: General Surgery

## 2011-06-27 ENCOUNTER — Other Ambulatory Visit (HOSPITAL_COMMUNITY): Payer: Self-pay | Admitting: Oncology

## 2011-06-27 DIAGNOSIS — F411 Generalized anxiety disorder: Secondary | ICD-10-CM

## 2011-06-27 MED ORDER — ALPRAZOLAM 0.5 MG PO TABS: 0.5000 mg | ORAL_TABLET | Freq: Three times a day (TID) | ORAL | Status: DC | PRN

## 2011-07-27 ENCOUNTER — Ambulatory Visit: Payer: Medicaid Other | Admitting: Family Medicine

## 2011-08-23 ENCOUNTER — Other Ambulatory Visit (HOSPITAL_COMMUNITY): Payer: Self-pay | Admitting: Oncology

## 2011-08-23 DIAGNOSIS — F411 Generalized anxiety disorder: Secondary | ICD-10-CM

## 2011-08-23 MED ORDER — ALPRAZOLAM 0.5 MG PO TABS: 0.5000 mg | ORAL_TABLET | Freq: Three times a day (TID) | ORAL | Status: DC | PRN

## 2011-09-19 ENCOUNTER — Encounter (HOSPITAL_COMMUNITY): Payer: Medicaid Other | Attending: Oncology | Admitting: Oncology

## 2011-09-19 VITALS — BP 161/92 | HR 79 | Temp 98.0°F | Wt 213.8 lb

## 2011-09-19 DIAGNOSIS — C50419 Malignant neoplasm of upper-outer quadrant of unspecified female breast: Secondary | ICD-10-CM

## 2011-09-19 DIAGNOSIS — F411 Generalized anxiety disorder: Secondary | ICD-10-CM

## 2011-09-19 DIAGNOSIS — G609 Hereditary and idiopathic neuropathy, unspecified: Secondary | ICD-10-CM

## 2011-09-19 DIAGNOSIS — C50919 Malignant neoplasm of unspecified site of unspecified female breast: Secondary | ICD-10-CM

## 2011-09-19 DIAGNOSIS — B353 Tinea pedis: Secondary | ICD-10-CM

## 2011-09-19 DIAGNOSIS — I10 Essential (primary) hypertension: Secondary | ICD-10-CM

## 2011-09-19 DIAGNOSIS — G629 Polyneuropathy, unspecified: Secondary | ICD-10-CM

## 2011-09-19 MED ORDER — GABAPENTIN 300 MG PO CAPS: 300.0000 mg | ORAL_CAPSULE | Freq: Two times a day (BID) | ORAL | Status: DC

## 2011-09-19 MED ORDER — NYSTATIN 100000 UNIT/GM EX POWD: Freq: Two times a day (BID) | CUTANEOUS | Status: DC

## 2011-09-19 MED ORDER — LISINOPRIL 10 MG PO TABS: 20.0000 mg | ORAL_TABLET | Freq: Every day | ORAL | Status: DC

## 2011-09-19 NOTE — Patient Instructions (Addendum)
[name]  [id number] 1960/11/23 Dr. Glenford Peers   [facility]  Discharge Instructions  RECOMMENDATIONS MADE BY THE CONSULTANT AND ANY TEST RESULTS WILL BE SENT TO YOUR REFERRING DOCTOR.   EXAM FINDINGS BY MD TODAY AND SIGNS AND SYMPTOMS TO REPORT TO CLINIC OR PRIMARY MD: exam and discussion per PA  MEDICATIONS PRESCRIBED: Refills for your lisinopril and gabapentin e-scribed Nystatin Powder - alternate using the powder with an antibiotic ointment or cream do them every 4 - 6 hours.  INSTRUCTIONS GIVEN AND DISCUSSED: Other :  Report any new lumps, bone pain or shortness of breath.  SPECIAL INSTRUCTIONS/FOLLOW-UP: Return to Clinic in 6 months. Dr. Jeanice Lim is taking new patients.  Call their office for an appointment [phone])   I acknowledge that I have been informed and understand all the instructions given to me and received a copy. I do not have any more questions at this time, but understand that I may call the Specialty Clinic at [facility] at [phone] during business hours should I have any further questions or need assistance in obtaining follow-up care.    __________________________________________  _____________  __________ Signature of Patient or Authorized Representative            Date                   Time    __________________________________________ Nurse's Signature

## 2011-09-19 NOTE — Progress Notes (Signed)
No primary provider on file. No primary provider on file.  1. H/O Stage III Breast cancer     CURRENT THERAPY: Observation  INTERVAL HISTORY: Shantell Belongia Ziebarth 51 y.o. female returns as a work-in for pedal fungal infection.  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  She is S/P preoperative dose dense AC prior to mastectomy which was on 10/12/2007.  Carlethia is accompanied by church friend named Toniann Fail.  Chattie is noncompliant and does not return for regular follow-up appointments.  She only comes to the clinic for complaints and acute issues.  She does not have a PCP which we encourage her to get, but she declines, "I don't trust anyone other than you (Tom, PA-C) and Dr. Mariel Sleet."  I did write down the name and number of Dr. Jeanice Lim who is accepting new patients.   Kaleeah's complaint today is cracking between her number 4-5 and 3-4 toes.  Its is erythematous with a white hue.  It does appear irritated and uncomfortable.  She reports that it is tender.  She has an impression/indentation of her toes on the sides of each toe (4-5).  She continues to complain of her low back pain.  She requests pain medication which I have declined.  Again, I recommended a PCP.  Her last lumbar x-ray revealed degenerative disk disease and lumbar spondylosis.  She will consider a PCP.   She requests a refill on her Xanax, but she has a refill left of her Rx given in July.  She also asks for a refill on her BP medication.  She also requests a Gabapentin refill.   Xanax has a refill so one was not given today.  I e-scribed her lisinopril and gabapentin.     Past Medical History  Diagnosis Date  . COPD (chronic obstructive pulmonary disease) 09/14/2010  . 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 H/O MRSA infection 09/14/2010  . Back muscle spasm 12/13/2010  . Psoriasis 12/13/2010  . Cancer   . Asthma     has TINEA PEDIS; ANXIETY; DEPRESSION; HYPERTENSION; H/O Stage III Breast  cancer; COPD (chronic obstructive pulmonary disease); Marijuana abuse in the past; H/O MRSA infection; Back muscle spasm; and Psoriasis on her problem list.     is allergic to codeine and penicillins.  [name] does not currently have medications on file.  Past Surgical History  Procedure Date  . Portacath insertion   . Mastectomy     left  . Abdominal hysterectomy   . Cesarean section     x7  . Port-a-cath removal 06/01/2011    Procedure: REMOVAL PORT-A-CATH;  Surgeon: Fabio Bering, MD;  Location: [facility];  Service: General;  Laterality: N/A;  Minor Room    Denies any headaches, dizziness, double vision, fevers, chills, night sweats, nausea, vomiting, diarrhea, constipation, chest pain, heart palpitations, shortness of breath, blood in stool, black tarry stool, urinary pain, urinary burning, urinary frequency, hematuria.   PHYSICAL EXAMINATION  ECOG PERFORMANCE STATUS: 1 - Symptomatic but completely ambulatory  Filed Vitals:   09/19/11 1120  BP: 161/92  Pulse: 79  Temp: 98 F (36.7 C)    GENERAL:alert, no distress, well nourished, well developed, comfortable, cooperative and smiling SKIN: skin color, texture, turgor are normal, no rashes or significant lesions HEAD: Normocephalic, No masses, lesions, tenderness or abnormalities EYES: normal, Conjunctiva are pink and non-injected EARS: External ears normal OROPHARYNX:lips, buccal mucosa, and tongue normal and mucous membranes are moist  NECK: supple, no adenopathy, trachea  midline LYMPH:  no palpable lymphadenopathy BREAST: B/L post-mastectomy site well healed and free of suspicious changes LUNGS: clear to auscultation and percussion HEART: regular rate & rhythm, no murmurs, no gallops, S1 normal and S2 normal ABDOMEN:abdomen soft, non-tender, obese, normal bowel sounds, no masses or organomegaly and no hepatosplenomegaly BACK: Back symmetric, no curvature. EXTREMITIES:less then 2 second capillary refill, no  joint deformities, effusion, or inflammation, no edema, no skin discoloration, no clubbing, no cyanosis.  Eczema on dorsal aspect of right foot.  Erythema and white area between  NEURO: alert & oriented x 3 with fluent speech, no focal motor/sensory deficits, gait normal   RADIOGRAPHIC STUDIES:  06/01/2011  *RADIOLOGY REPORT*  Clinical Data: Low back pain for the past several months. No known  injury.  LUMBAR SPINE - COMPLETE 4+ VIEW  Comparison: No priors.  Findings: Multiple views of the lumbar spine demonstrates normal  anatomic alignment. No acute displaced fractures or compression  type fractures are identified. Mild multilevel degenerative disc  disease is noted, most pronounced at L5-S1. Mild facet arthropathy  is also noted at L5-S1. Atherosclerosis in the distal abdominal  aorta.  IMPRESSION:  1. No acute radiographic abnormality of the lumbar spine.  2. Mild degenerative disc disease and lumbar spondylosis in the  lower lumbar spine, as above.  3. Atherosclerosis.  Original Report Authenticated By: Florencia Reasons, M.D.    PATHOLOGY: 1. Breast, left, modified radical mastectomy- high grade invasive ductal carcinoma, 2.5 cm, clear resection margins, 0/10 lymph nodes for metastatic disease, grade III, No LVI, ER/PR negative, Ki-67 22%, Her2 negative.  2. Right breast, simple mastectomy- changes consistent with chemotherapy treatment, fibrocystic changes, no malignancy     ASSESSMENT: 1. Stage III, poorly differentiated, triple negative breast cancer, S/P chemotherapy and mastectomy  2. Anxiety  3. Psoriasis  4. Back muscle spasm  5. Non-compliance  6. Marijuana usage  7. MRSA carrier  8. Poor hygiene 9. Pedal fungal infection   PLAN:  1. I e-scribed Nystatin powder to [facility], 0 refills.  2. Patient will purchase antibacterial ointment OTC. 3. Patient will alternate Nystatin powder with antibacterial ointment four times daily.  4. E-scribed Lisinopril  to [facility] 5. E-scribed Gabapentin to [facility] 6. Declined pain medication 7. Declined Xanax refill and patient encouraged to contact pharmacy when Xanax refill is nearly consumed.  8. Cancel August appointment 9.  Patient will call the clinic in 2-4 weeks to let us know if feet improved.  If not, she must see PCP or Dermatologist. 10. Return in 6 months for follow-up.  Dr. Deirdre Peer contact information given to the patient and she was encouraged to establish herself as a new patient with Dr. Jeanice Lim.    All questions were answered. The patient knows to call the clinic with any problems, questions or concerns. We can certainly see the patient much sooner if necessary.  The patient and plan discussed with Glenford Peers, MD and he is in agreement with the aforementioned.  I spent 20 minutes counseling the patient face to face. The total time spent in the appointment was 25 minutes.  KEFALAS,THOMAS

## 2011-09-20 ENCOUNTER — Telehealth (HOSPITAL_COMMUNITY): Payer: Self-pay | Admitting: Oncology

## 2011-09-20 NOTE — Telephone Encounter (Signed)
Patient called.  Dr. Jeanice Lim does not take accept medicaid.  Will ask nursing to find out what PCPs accept medicaid.  [name]

## 2011-09-21 ENCOUNTER — Telehealth (HOSPITAL_COMMUNITY): Payer: Self-pay

## 2011-09-21 NOTE — Telephone Encounter (Signed)
Message left for patient and encouraged her to check with the Health Department or go to the [facility].

## 2011-09-21 NOTE — Telephone Encounter (Signed)
Message copied by Evelena Leyden on Wed Sep 21, 2011  6:35 PM ------      Message from: Ellouise Newer III      Created: Wed Sep 21, 2011  4:22 PM       Please inform patient.             ----- Message -----         From: Evelena Leyden, RN         Sent: 09/21/2011   9:34 AM           To: Ellouise Newer, PA                        ----- Message -----         From: Gaynelle Adu Delancey         Sent: 09/21/2011   9:18 AM           To: Evelena Leyden, RN            I called around and nobody in town is... Either they are not taking a new one or No Medicaid.......      ----- Message -----         From: Evelena Leyden, RN         Sent: 09/20/2011   6:25 PM           To: Jaci Lazier Delancey, #            I have no idea.  Maybe Mazzie or Angie knows. I will forward to them.      Fannie Knee      ----- Message -----         From: Ellouise Newer, PA         Sent: 09/20/2011   5:53 PM           To: Nanci Pina Nurse Ap            What PCP is accepting medicaid?

## 2011-09-26 ENCOUNTER — Other Ambulatory Visit (HOSPITAL_COMMUNITY): Payer: Self-pay | Admitting: Oncology

## 2011-09-26 DIAGNOSIS — F329 Major depressive disorder, single episode, unspecified: Secondary | ICD-10-CM

## 2011-09-26 DIAGNOSIS — F32A Depression, unspecified: Secondary | ICD-10-CM

## 2011-09-26 MED ORDER — ESCITALOPRAM OXALATE 10 MG PO TABS: 10.0000 mg | ORAL_TABLET | Freq: Every day | ORAL | Status: DC

## 2011-10-10 ENCOUNTER — Other Ambulatory Visit (HOSPITAL_COMMUNITY): Payer: Self-pay | Admitting: Oncology

## 2011-10-10 ENCOUNTER — Telehealth (HOSPITAL_COMMUNITY): Payer: Self-pay | Admitting: Oncology

## 2011-10-10 DIAGNOSIS — F411 Generalized anxiety disorder: Secondary | ICD-10-CM

## 2011-10-10 DIAGNOSIS — B353 Tinea pedis: Secondary | ICD-10-CM

## 2011-10-10 MED ORDER — NYSTATIN 100000 UNIT/GM EX POWD: Freq: Two times a day (BID) | CUTANEOUS | Status: DC

## 2011-10-10 MED ORDER — ALPRAZOLAM 0.5 MG PO TABS: 0.5000 mg | ORAL_TABLET | Freq: Three times a day (TID) | ORAL | Status: DC | PRN

## 2011-10-10 NOTE — Telephone Encounter (Signed)
Patient called tearful.  She is due to see mental health in [place] on [facility].  Her dog died and this is upsetting for her.  She asks for Xanax and I called [facility]  She picked up her last Rx on 7/29.  Therefore I will nt give her any more.   She also asked for a refill on her Nystatin powder.  This was E-Scribed.  Josely Moffat

## 2011-10-11 ENCOUNTER — Ambulatory Visit (HOSPITAL_COMMUNITY): Payer: Medicaid Other | Admitting: Oncology

## 2011-10-13 ENCOUNTER — Other Ambulatory Visit (HOSPITAL_COMMUNITY): Payer: Self-pay | Admitting: Oncology

## 2011-10-13 ENCOUNTER — Telehealth (HOSPITAL_COMMUNITY): Payer: Self-pay | Admitting: Oncology

## 2011-10-13 DIAGNOSIS — A4902 Methicillin resistant Staphylococcus aureus infection, unspecified site: Secondary | ICD-10-CM

## 2011-10-13 MED ORDER — DOXYCYCLINE HYCLATE 100 MG PO TABS: 100.0000 mg | ORAL_TABLET | Freq: Two times a day (BID) | ORAL | Status: AC

## 2011-10-13 NOTE — Telephone Encounter (Signed)
[name] called today reports "boils on face."  She is a MRSA carrier.  Therefore, will call in Doxycycline for 14 days.  Recommended dial soap, clean clothes, and call us in 1 week to let us know how she is doing.   [name]

## 2011-11-30 ENCOUNTER — Encounter (HOSPITAL_COMMUNITY): Payer: Self-pay | Admitting: Oncology

## 2011-11-30 NOTE — Progress Notes (Signed)
[name]  DOB March 03, 1960 CSN [id number]  MRN [id number] Dr. Glenford Peers   Patient came to the [facility] and stopped Dr. Mariel Sleet in the hallway to ask about a rash/sore on her foot.  Per Dr. Mariel Sleet, patient was advised to follow-up with her PCP or the Health Department.  The following information was printed and given to the patient:  Per Dr. Mariel Sleet, please begin using Mycatin powder OR lotion, three times a day for two whole weeks.  Please go to the Health Department for evaluation of possible diabetes/elevated blood sugars.

## 2011-12-08 ENCOUNTER — Telehealth (HOSPITAL_COMMUNITY): Payer: Self-pay | Admitting: Oncology

## 2011-12-08 ENCOUNTER — Other Ambulatory Visit (HOSPITAL_COMMUNITY): Payer: Self-pay | Admitting: Oncology

## 2011-12-08 DIAGNOSIS — A4902 Methicillin resistant Staphylococcus aureus infection, unspecified site: Secondary | ICD-10-CM

## 2011-12-08 DIAGNOSIS — L0390 Cellulitis, unspecified: Secondary | ICD-10-CM

## 2011-12-08 MED ORDER — DOXYCYCLINE HYCLATE 100 MG PO TABS: 100.0000 mg | ORAL_TABLET | Freq: Two times a day (BID) | ORAL | Status: AC

## 2011-12-08 NOTE — Telephone Encounter (Signed)
Will e-scribe Doxycycline 100 mg BID x 14 days to [facility] with a note to deliver.  [name]

## 2011-12-28 ENCOUNTER — Other Ambulatory Visit (HOSPITAL_COMMUNITY): Payer: Self-pay | Admitting: Oncology

## 2011-12-28 DIAGNOSIS — E876 Hypokalemia: Secondary | ICD-10-CM

## 2011-12-28 MED ORDER — POTASSIUM CHLORIDE ER 10 MEQ PO TBCR: 10.0000 meq | EXTENDED_RELEASE_TABLET | Freq: Two times a day (BID) | ORAL | Status: DC

## 2012-01-09 ENCOUNTER — Other Ambulatory Visit (HOSPITAL_COMMUNITY): Payer: Self-pay | Admitting: Oncology

## 2012-01-09 DIAGNOSIS — F411 Generalized anxiety disorder: Secondary | ICD-10-CM

## 2012-01-09 MED ORDER — ALPRAZOLAM 0.5 MG PO TABS: 0.5000 mg | ORAL_TABLET | Freq: Three times a day (TID) | ORAL | Status: DC | PRN

## 2012-02-09 ENCOUNTER — Other Ambulatory Visit (HOSPITAL_COMMUNITY): Payer: Self-pay | Admitting: Oncology

## 2012-02-09 DIAGNOSIS — I10 Essential (primary) hypertension: Secondary | ICD-10-CM

## 2012-02-09 MED ORDER — LISINOPRIL 10 MG PO TABS: 20.0000 mg | ORAL_TABLET | Freq: Every day | ORAL | Status: DC

## 2012-03-12 ENCOUNTER — Other Ambulatory Visit (HOSPITAL_COMMUNITY): Payer: Self-pay | Admitting: Oncology

## 2012-03-12 ENCOUNTER — Telehealth (HOSPITAL_COMMUNITY): Payer: Self-pay | Admitting: *Deleted

## 2012-03-12 DIAGNOSIS — R110 Nausea: Secondary | ICD-10-CM

## 2012-03-12 DIAGNOSIS — R059 Cough, unspecified: Secondary | ICD-10-CM

## 2012-03-12 DIAGNOSIS — R05 Cough: Secondary | ICD-10-CM

## 2012-03-12 MED ORDER — BENZONATATE 100 MG PO CAPS: 100.0000 mg | ORAL_CAPSULE | Freq: Three times a day (TID) | ORAL | Status: DC | PRN

## 2012-03-12 MED ORDER — PROCHLORPERAZINE MALEATE 10 MG PO TABS: 10.0000 mg | ORAL_TABLET | Freq: Four times a day (QID) | ORAL | Status: DC | PRN

## 2012-03-12 NOTE — Telephone Encounter (Signed)
Patient called requesting cough medicine and nausea medicine. States she has cough-non productive, and nausea from the cough. Uses [employer].

## 2012-03-21 ENCOUNTER — Ambulatory Visit (HOSPITAL_COMMUNITY): Payer: Medicaid Other | Admitting: Oncology

## 2012-03-26 ENCOUNTER — Other Ambulatory Visit (HOSPITAL_COMMUNITY): Payer: Self-pay | Admitting: Oncology

## 2012-03-26 DIAGNOSIS — F32A Depression, unspecified: Secondary | ICD-10-CM

## 2012-03-26 DIAGNOSIS — F329 Major depressive disorder, single episode, unspecified: Secondary | ICD-10-CM

## 2012-03-26 MED ORDER — ESCITALOPRAM OXALATE 10 MG PO TABS: 10.0000 mg | ORAL_TABLET | Freq: Every day | ORAL | Status: DC

## 2012-04-02 ENCOUNTER — Other Ambulatory Visit (HOSPITAL_COMMUNITY): Payer: Self-pay | Admitting: Oncology

## 2012-04-02 ENCOUNTER — Telehealth (HOSPITAL_COMMUNITY): Payer: Self-pay | Admitting: Oncology

## 2012-04-02 DIAGNOSIS — F411 Generalized anxiety disorder: Secondary | ICD-10-CM

## 2012-04-02 MED ORDER — ALPRAZOLAM 0.5 MG PO TABS: 0.5000 mg | ORAL_TABLET | Freq: Three times a day (TID) | ORAL | Status: DC | PRN

## 2012-04-03 ENCOUNTER — Encounter (HOSPITAL_COMMUNITY): Payer: Medicaid Other | Attending: Oncology | Admitting: Oncology

## 2012-04-03 ENCOUNTER — Encounter (HOSPITAL_COMMUNITY): Payer: Self-pay | Admitting: Oncology

## 2012-04-03 VITALS — BP 147/98 | HR 86 | Temp 98.5°F | Resp 16 | Wt 192.4 lb

## 2012-04-03 DIAGNOSIS — A4902 Methicillin resistant Staphylococcus aureus infection, unspecified site: Secondary | ICD-10-CM

## 2012-04-03 DIAGNOSIS — Z22322 Carrier or suspected carrier of Methicillin resistant Staphylococcus aureus: Secondary | ICD-10-CM

## 2012-04-03 DIAGNOSIS — C50419 Malignant neoplasm of upper-outer quadrant of unspecified female breast: Secondary | ICD-10-CM

## 2012-04-03 DIAGNOSIS — L089 Local infection of the skin and subcutaneous tissue, unspecified: Secondary | ICD-10-CM

## 2012-04-03 DIAGNOSIS — C50919 Malignant neoplasm of unspecified site of unspecified female breast: Secondary | ICD-10-CM

## 2012-04-03 DIAGNOSIS — Z171 Estrogen receptor negative status [ER-]: Secondary | ICD-10-CM

## 2012-04-03 MED ORDER — SULFAMETHOXAZOLE-TRIMETHOPRIM 800-160 MG PO TABS: 1.0000 | ORAL_TABLET | Freq: Two times a day (BID) | ORAL | Status: DC

## 2012-04-03 NOTE — Progress Notes (Signed)
No primary provider on file. No primary provider on file.  1. H/O Stage III Breast cancer     CURRENT THERAPY:Observation  INTERVAL HISTORY: [name] 52 y.o. 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   Details are documented in Oncology History on Problem list.   [name] is here for follow-up.  She looks great today.  She is seen smiling.  She has make-up on.  Her hair is done nicely.  She looks like she is taking better care of herself.  She reports that she has custody of her grandchildren which has made here very happy.  She is proud to "mother" again.  She truly looks happy today.   She reports that she is eating healthier and eating more fruits and vegetables.  She is trying to be a good role model for the grandchildren.   She is has lost nearly 20 lbs, but she is eating healthier and is more active with the children.  She is down to 192.4 lbs compared to 213.8 lbs on 09/19/2011.   She reports some left breast changes.  On physical exam she demonstrates that her superior, lateral pectoralis area feels different to her.  On exam, the muscle feels tight.  She is lifting her grandchildren and using a stroller and I suspect this change is from weight loss and muscle strain.  No suspicious masses or lesions noted.   She reports a rash on her face and neck and upper chest.  It is noted to be erythematous with pustules. She is a MRSA carrier. I will treat this with Septra.   Oncologically, she denies any complaints.    Past Medical History  Diagnosis Date  . COPD (chronic obstructive pulmonary disease) 09/14/2010  . 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 H/O MRSA infection 09/14/2010  . Back muscle spasm 12/13/2010  . Psoriasis 12/13/2010  . Cancer   . Asthma     has TINEA PEDIS; ANXIETY; DEPRESSION; HYPERTENSION; H/O Stage III Breast cancer; COPD (chronic obstructive pulmonary disease); Marijuana abuse  in the past; H/O MRSA infection; Back muscle spasm; and Psoriasis on her problem list.     is allergic to codeine and penicillins.  [name] had no medications administered during this visit.  Past Surgical History  Procedure Laterality Date  . Portacath insertion    . Mastectomy      left  . Abdominal hysterectomy    . Cesarean section      x7  . Port-a-cath removal  06/01/2011    Procedure: REMOVAL PORT-A-CATH;  Surgeon: Fabio Bering, MD;  Location: [facility];  Service: General;  Laterality: N/A;  Minor Room    Denies any headaches, dizziness, double vision, fevers, chills, night sweats, nausea, vomiting, diarrhea, constipation, chest pain, heart palpitations, shortness of breath, blood in stool, black tarry stool, urinary pain, urinary burning, urinary frequency, hematuria.   PHYSICAL EXAMINATION  ECOG PERFORMANCE STATUS: 0 - Asymptomatic  Filed Vitals:   04/03/12 1038  BP: 147/98  Pulse: 86  Temp: 98.5 F (36.9 C)  Resp: 16    GENERAL:alert, well nourished, well developed, comfortable, cooperative and smiling, she looks the best I have ever seen her look. SKIN: skin color, texture, turgor are normal, positive for: erythematous rash with pustules HEAD: Normocephalic, No masses, lesions, tenderness or abnormalities EYES: normal, Conjunctiva are pink and non-injected EARS: External ears normal OROPHARYNX:mucous membranes are moist  NECK: supple, no adenopathy,  trachea midline LYMPH:  no palpable lymphadenopathy BREAST:B/L post-mastectomy site well healed and free of suspicious changes.  Left tightness of pectoralis muscle with some fibrotic tissue at surgical site.   LUNGS: clear to auscultation and percussion HEART: regular rate & rhythm, no murmurs, no gallops, S1 normal and S2 normal ABDOMEN:abdomen soft, non-tender, obese and normal bowel sounds BACK: Back symmetric, no curvature., No CVA tenderness EXTREMITIES:less then 2 second capillary refill, no joint  deformities, effusion, or inflammation, no edema, no skin discoloration, no clubbing, no cyanosis  NEURO: alert & oriented x 3 with fluent speech, no focal motor/sensory deficits, gait normal    LABORATORY DATA: CBC    Component Value Date/Time   WBC 8.3 04/13/2011 1124   RBC 4.70 04/13/2011 1124   HGB 15.1* 04/13/2011 1124   HCT 44.4 04/13/2011 1124   PLT 254 04/13/2011 1124   MCV 94.5 04/13/2011 1124   MCH 32.1 04/13/2011 1124   MCHC 34.0 04/13/2011 1124   RDW 14.3 04/13/2011 1124   LYMPHSABS 2.2 04/13/2011 1124   MONOABS 0.5 04/13/2011 1124   EOSABS 0.1 04/13/2011 1124   BASOSABS 0.0 04/13/2011 1124      Chemistry      Component Value Date/Time   NA 140 04/13/2011 1124   K 3.8 04/13/2011 1124   CL 101 04/13/2011 1124   CO2 30 04/13/2011 1124   BUN 9 04/13/2011 1124   CREATININE 0.69 04/13/2011 1124      Component Value Date/Time   CALCIUM 10.8* 04/13/2011 1124   ALKPHOS 89 04/13/2011 1124   AST 27 04/13/2011 1124   ALT 27 04/13/2011 1124   BILITOT 0.3 04/13/2011 1124       PATHOLOGY: 1. Breast, left, modified radical mastectomy- high grade invasive ductal carcinoma, 2.5 cm, clear resection margins, 0/10 lymph nodes for metastatic disease, grade III, No LVI, ER/PR negative, Ki-67 22%, Her2 negative.  2. Right breast, simple mastectomy- changes consistent with chemotherapy treatment, fibrocystic changes, no malignancy     ASSESSMENT:  1. Stage III, poorly differentiated, triple negative breast cancer, S/P chemotherapy and mastectomy.  She is 4 year out from therapy this Feb 26. 2. Anxiety  3. Psoriasis  4. Back muscle spasm  5. Non-compliance  6. Marijuana usage  7. MRSA carrier  8. Poor hygeine 9. Skin infection with pustules. Infection noted on face, neck, and upper chest.   PLAN:  1. I personally reviewed and went over laboratory results with the patient. 2. I personally reviewed and went over radiographic studies with the patient. 3. Will perform B/L [place] of breast.  4. No  need for lab work today.  5. Rx for Septra DS for skin infection on face and chest.  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 6. Patient education regarding dial soap with bathing. 7. Return in 6 months. In Feb 2015, she will be 5 years out from therapy and we will start to see her yearly.    All questions were answered. The patient knows to call the clinic with any problems, questions or concerns. We can certainly see the patient much sooner if necessary.  The patient and plan discussed with Glenford Peers, MD and he is in agreement with the aforementioned.  Syna Gad

## 2012-04-03 NOTE — Patient Instructions (Addendum)
[facility] Discharge Instructions  RECOMMENDATIONS MADE BY THE CONSULTANT AND ANY TEST RESULTS WILL BE SENT TO YOUR REFERRING PHYSICIAN.  We will get you scheduled for an ultrasound since you can no longer do mammograms. We will call you with that appointment later. We will schedule you to come back in 6 months. After your next doctor's appointment you will be out x 5 years, we will then start seeing you just once a year. Report any issues/concerns as needed to clinic prior to appointment.  Thank you for choosing [facility] to provide your oncology and hematology care.  To afford each patient quality time with our providers, please arrive at least 15 minutes before your scheduled appointment time.  With your help, our goal is to use those 15 minutes to complete the necessary work-up to ensure our physicians have the information they need to help with your evaluation and healthcare recommendations.    Effective January 1st, 2014, we ask that you re-schedule your appointment with our physicians should you arrive 10 or more minutes late for your appointment.  We strive to give you quality time with our providers, and arriving late affects you and other patients whose appointments are after yours.    Again, thank you for choosing [facility].  Our hope is that these requests will decrease the amount of time that you wait before being seen by our physicians.       _____________________________________________________________  Should you have questions after your visit to [facility], please contact our office at [phone] between the hours of 8:30 a.m. and 5:00 p.m.  Voicemails left after 4:30 p.m. will not be returned until the following business day.  For prescription refill requests, have your pharmacy contact our office with your prescription refill request.

## 2012-04-18 ENCOUNTER — Ambulatory Visit (HOSPITAL_COMMUNITY)
Admission: RE | Admit: 2012-04-18 | Discharge: 2012-04-18 | Disposition: A | Discharge status: Home / Self Care | Payer: Medicaid Other | Source: Ambulatory Visit | Attending: Oncology | Admitting: Oncology

## 2012-04-18 ENCOUNTER — Other Ambulatory Visit (HOSPITAL_COMMUNITY): Payer: Self-pay | Admitting: Oncology

## 2012-04-18 ENCOUNTER — Ambulatory Visit (HOSPITAL_COMMUNITY): Payer: Medicaid Other

## 2012-04-18 DIAGNOSIS — N644 Mastodynia: Secondary | ICD-10-CM | POA: Insufficient documentation

## 2012-04-18 DIAGNOSIS — C50919 Malignant neoplasm of unspecified site of unspecified female breast: Secondary | ICD-10-CM | POA: Insufficient documentation

## 2012-06-12 ENCOUNTER — Other Ambulatory Visit (HOSPITAL_COMMUNITY): Payer: Self-pay | Admitting: Oncology

## 2012-06-12 DIAGNOSIS — F411 Generalized anxiety disorder: Secondary | ICD-10-CM

## 2012-06-12 MED ORDER — ALPRAZOLAM 0.5 MG PO TABS: 0.5000 mg | ORAL_TABLET | Freq: Three times a day (TID) | ORAL | Status: DC | PRN

## 2012-07-23 ENCOUNTER — Other Ambulatory Visit (HOSPITAL_COMMUNITY): Payer: Self-pay | Admitting: Oncology

## 2012-07-23 DIAGNOSIS — A4902 Methicillin resistant Staphylococcus aureus infection, unspecified site: Secondary | ICD-10-CM

## 2012-07-23 DIAGNOSIS — I10 Essential (primary) hypertension: Secondary | ICD-10-CM

## 2012-07-23 MED ORDER — LISINOPRIL 10 MG PO TABS: 20.0000 mg | ORAL_TABLET | Freq: Every day | ORAL | Status: DC

## 2012-07-23 MED ORDER — DOXYCYCLINE HYCLATE 100 MG PO TABS: 100.0000 mg | ORAL_TABLET | Freq: Two times a day (BID) | ORAL | Status: DC

## 2012-07-24 ENCOUNTER — Emergency Department (HOSPITAL_COMMUNITY)
Admission: EM | Admit: 2012-07-24 | Discharge: 2012-07-24 | Disposition: A | Discharge status: Home / Self Care | Payer: Medicaid Other | Attending: Emergency Medicine | Admitting: Emergency Medicine

## 2012-07-24 ENCOUNTER — Encounter (HOSPITAL_COMMUNITY): Payer: Self-pay | Admitting: Nurse Practitioner

## 2012-07-24 DIAGNOSIS — S81009A Unspecified open wound, unspecified knee, initial encounter: Secondary | ICD-10-CM | POA: Insufficient documentation

## 2012-07-24 DIAGNOSIS — Z8614 Personal history of Methicillin resistant Staphylococcus aureus infection: Secondary | ICD-10-CM | POA: Insufficient documentation

## 2012-07-24 DIAGNOSIS — Y929 Unspecified place or not applicable: Secondary | ICD-10-CM | POA: Insufficient documentation

## 2012-07-24 DIAGNOSIS — W010XXA Fall on same level from slipping, tripping and stumbling without subsequent striking against object, initial encounter: Secondary | ICD-10-CM | POA: Insufficient documentation

## 2012-07-24 DIAGNOSIS — J4489 Other specified chronic obstructive pulmonary disease: Secondary | ICD-10-CM | POA: Insufficient documentation

## 2012-07-24 DIAGNOSIS — Z79899 Other long term (current) drug therapy: Secondary | ICD-10-CM | POA: Insufficient documentation

## 2012-07-24 DIAGNOSIS — F17200 Nicotine dependence, unspecified, uncomplicated: Secondary | ICD-10-CM | POA: Insufficient documentation

## 2012-07-24 DIAGNOSIS — Z872 Personal history of diseases of the skin and subcutaneous tissue: Secondary | ICD-10-CM | POA: Insufficient documentation

## 2012-07-24 DIAGNOSIS — Z859 Personal history of malignant neoplasm, unspecified: Secondary | ICD-10-CM | POA: Insufficient documentation

## 2012-07-24 DIAGNOSIS — Z23 Encounter for immunization: Secondary | ICD-10-CM | POA: Insufficient documentation

## 2012-07-24 DIAGNOSIS — IMO0002 Reserved for concepts with insufficient information to code with codable children: Secondary | ICD-10-CM

## 2012-07-24 DIAGNOSIS — F1210 Cannabis abuse, uncomplicated: Secondary | ICD-10-CM | POA: Insufficient documentation

## 2012-07-24 DIAGNOSIS — J45909 Unspecified asthma, uncomplicated: Secondary | ICD-10-CM | POA: Insufficient documentation

## 2012-07-24 DIAGNOSIS — J449 Chronic obstructive pulmonary disease, unspecified: Secondary | ICD-10-CM | POA: Insufficient documentation

## 2012-07-24 DIAGNOSIS — Z880 Allergy status to penicillin: Secondary | ICD-10-CM | POA: Insufficient documentation

## 2012-07-24 DIAGNOSIS — Y9389 Activity, other specified: Secondary | ICD-10-CM | POA: Insufficient documentation

## 2012-07-24 MED ORDER — LORAZEPAM 2 MG/ML IJ SOLN
1.0000 mg | Freq: Once | INTRAMUSCULAR | Status: AC
  Administered 2012-07-24: 1 mg via INTRAMUSCULAR
  Filled 2012-07-24: qty 1

## 2012-07-24 MED ORDER — LIDOCAINE-EPINEPHRINE 2 %-1:100000 IJ SOLN
20.0000 mL | Freq: Once | INTRAMUSCULAR | Status: AC
  Administered 2012-07-24: 20 mL via INTRADERMAL
  Filled 2012-07-24: qty 20

## 2012-07-24 MED ORDER — TETANUS-DIPHTH-ACELL PERTUSSIS 5-2.5-18.5 LF-MCG/0.5 IM SUSP
0.5000 mL | Freq: Once | INTRAMUSCULAR | Status: AC
  Administered 2012-07-24: 0.5 mL via INTRAMUSCULAR
  Filled 2012-07-24 (×2): qty 0.5

## 2012-07-24 MED ORDER — HYDROCODONE-ACETAMINOPHEN 5-325 MG PO TABS: ORAL_TABLET | ORAL | Status: DC

## 2012-07-24 MED ORDER — OXYCODONE-ACETAMINOPHEN 5-325 MG PO TABS
1.0000 | ORAL_TABLET | Freq: Once | ORAL | Status: AC
  Administered 2012-07-24: 1 via ORAL
  Filled 2012-07-24: qty 1

## 2012-07-24 NOTE — ED Notes (Signed)
Per ems: pt tripped on stairs and injured R shin, open wound noted on arrival by ems, bleeding controlled and dressing placed. Pt denies any other complaints. VSS en route, A&Ox4.

## 2012-07-24 NOTE — ED Provider Notes (Signed)
History     CSN: [id number]  Arrival date & time 07/24/12  1225   First MD Initiated Contact with Patient 07/24/12 1245      Chief Complaint  Patient presents with  . Fall    (Consider location/radiation/quality/duration/timing/severity/associated sxs/prior treatment) HPI  [name] is a 52 y.o. female complaining of laceration to anterior of right shin onset this a.m. Last tetanus unknown. Patient was going to get a dresser her father's funeral, her father passed away yesterday she had a mechanical slip and fall she was wearing a slipper. She denies head trauma, LOC, neck pain, shortness of breath, chest pain, abdominal pain, dizziness, paresthesia. Last tetanus shot is unknown.  Past Medical History  Diagnosis Date  . COPD (chronic obstructive pulmonary disease) 09/14/2010  . 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 H/O MRSA infection 09/14/2010  . Back muscle spasm 12/13/2010  . Psoriasis 12/13/2010  . Cancer   . Asthma     Past Surgical History  Procedure Laterality Date  . Portacath insertion    . Mastectomy      left  . Abdominal hysterectomy    . Cesarean section      x7  . Port-a-cath removal  06/01/2011    Procedure: REMOVAL PORT-A-CATH;  Surgeon: Fabio Bering, MD;  Location: [facility];  Service: General;  Laterality: N/A;  Minor Room  . Mastectomy      Family History  Problem Relation Age of Onset  . Stroke Mother   . Stroke Father   . Cancer Maternal Aunt   . Cancer Maternal Grandmother   . Cancer Maternal Aunt   . Cancer Maternal Aunt     History  Substance Use Topics  . Smoking status: Current Some Day Smoker -- 0.25 packs/day for 36 years  . Smokeless tobacco: Never Used  . Alcohol Use: No    OB History   Grav Para Term Preterm Abortions TAB SAB Ect Mult Living                  Review of Systems  Constitutional: Negative for fever.  Respiratory: Negative for shortness of breath.   Cardiovascular: Negative for chest pain.   Gastrointestinal: Negative for nausea, vomiting, abdominal pain and diarrhea.  Skin: Positive for wound.  All other systems reviewed and are negative.    Allergies  Codeine and Penicillins  Home Medications   Current Outpatient Rx  Name  Route  Sig  Dispense  Refill  . ALPRAZolam (XANAX) 0.5 MG tablet   Oral   Take 0.5 mg by mouth 3 (three) times daily as needed for sleep or anxiety.         Marland Kitchen lisinopril (PRINIVIL,ZESTRIL) 10 MG tablet   Oral   Take 20 mg by mouth daily.           BP 152/90  Pulse 96  Temp(Src) 98.3 F (36.8 C) (Oral)  Resp 16  SpO2 97%  Physical Exam  Nursing note and vitals reviewed. Constitutional: She is oriented to person, place, and time. She appears well-developed and well-nourished. No distress.  HENT:  Head: Normocephalic.  Eyes: Conjunctivae and EOM are normal.  Cardiovascular: Normal rate.   Pulmonary/Chest: Effort normal. No stridor.  Musculoskeletal: Normal range of motion.  Neurological: She is alert and oriented to person, place, and time.  Skin:  8 cm full-thickness laceration to right shin, no gross foreign bodies or contamination. Bleeding is controlled. Distally neurovascularly intact.   Psychiatric: She has a  normal mood and affect.    ED Course  Procedures (including critical care time)  LACERATION REPAIR Performed by: Wynetta Emery Authorized by: Wynetta Emery Consent: Verbal consent obtained. Risks and benefits: risks, benefits and alternatives were discussed Consent given by: patient Patient identity confirmed: Wrist band  Prepped and Draped in normal sterile fashion  Tetanus: tDap updated   Laceration Location: Right shin  Laceration Length: 8cm  Anesthesia: local  Local anesthetic: 2% with epinephrine  Anesthetic total: 10 ml  Irrigation method: syringe  Amount of cleaning: copious   Wound explored to depth in good light on a bloodless field with no foreign bodies seen or palpated.    Skin closure: 4-0 ethilon  Number of sutures: 8   Technique: Horizontal mattress and simple interrupted   Patient tolerance: Patient tolerated the procedure well with no immediate complications.  Antibx ointment applied. Instructions for care discussed verbally and patient provided with additional written instructions for homecare and f/u.  Labs Reviewed - No data to display No results found.   1. Laceration       MDM   Filed Vitals:   07/24/12 1226  BP: 152/90  Pulse: 96  Temp: 98.3 F (36.8 C)  TempSrc: Oral  Resp: 16  SpO2: 97%     [name] is a 52 y.o. female with laceration to right shin, closed with combination of horizontal mattress and simple interrupted sutures. Tetanus updated. Patient is HIV positive but with excellent CD4 count no indication for prophylactic antibiotics at this time.  Medications  LORazepam (ATIVAN) injection 1 mg (1 mg Intramuscular Given 07/24/12 1420)  oxyCODONE-acetaminophen (PERCOCET/ROXICET) 5-325 MG per tablet 1 tablet (1 tablet Oral Given 07/24/12 1420)  TDaP (BOOSTRIX) injection 0.5 mL (0.5 mLs Intramuscular Given 07/24/12 1444)  lidocaine-EPINEPHrine (XYLOCAINE W/EPI) 2 %-1:100000 (with pres) injection 20 mL (20 mLs Intradermal Given by Other 07/24/12 1412)    The patient is hemodynamically stable, appropriate for, and amenable to, discharge at this time. Pt verbalized understanding and agrees with care plan. Outpatient follow-up and return precautions given.          Wynetta Emery, PA-C 07/24/12 1538

## 2012-07-24 NOTE — ED Provider Notes (Signed)
Medical screening examination/treatment/procedure(s) were performed by non-physician practitioner and as supervising physician I was immediately available for consultation/collaboration.   Charles B. Bernette Mayers, MD 07/24/12 1546

## 2012-07-31 ENCOUNTER — Emergency Department (HOSPITAL_COMMUNITY)
Admission: EM | Admit: 2012-07-31 | Discharge: 2012-07-31 | Disposition: A | Discharge status: Home / Self Care | Payer: Medicaid Other | Attending: Emergency Medicine | Admitting: Emergency Medicine

## 2012-07-31 ENCOUNTER — Encounter (HOSPITAL_COMMUNITY): Payer: Self-pay | Admitting: Emergency Medicine

## 2012-07-31 DIAGNOSIS — Z859 Personal history of malignant neoplasm, unspecified: Secondary | ICD-10-CM | POA: Insufficient documentation

## 2012-07-31 DIAGNOSIS — Z880 Allergy status to penicillin: Secondary | ICD-10-CM | POA: Insufficient documentation

## 2012-07-31 DIAGNOSIS — F1210 Cannabis abuse, uncomplicated: Secondary | ICD-10-CM | POA: Insufficient documentation

## 2012-07-31 DIAGNOSIS — J449 Chronic obstructive pulmonary disease, unspecified: Secondary | ICD-10-CM | POA: Insufficient documentation

## 2012-07-31 DIAGNOSIS — Z79899 Other long term (current) drug therapy: Secondary | ICD-10-CM | POA: Insufficient documentation

## 2012-07-31 DIAGNOSIS — Y838 Other surgical procedures as the cause of abnormal reaction of the patient, or of later complication, without mention of misadventure at the time of the procedure: Secondary | ICD-10-CM | POA: Insufficient documentation

## 2012-07-31 DIAGNOSIS — T798XXA Other early complications of trauma, initial encounter: Secondary | ICD-10-CM

## 2012-07-31 DIAGNOSIS — T8140XA Infection following a procedure, unspecified, initial encounter: Secondary | ICD-10-CM | POA: Insufficient documentation

## 2012-07-31 DIAGNOSIS — J45909 Unspecified asthma, uncomplicated: Secondary | ICD-10-CM | POA: Insufficient documentation

## 2012-07-31 DIAGNOSIS — F17200 Nicotine dependence, unspecified, uncomplicated: Secondary | ICD-10-CM | POA: Insufficient documentation

## 2012-07-31 DIAGNOSIS — Z8614 Personal history of Methicillin resistant Staphylococcus aureus infection: Secondary | ICD-10-CM | POA: Insufficient documentation

## 2012-07-31 DIAGNOSIS — Z872 Personal history of diseases of the skin and subcutaneous tissue: Secondary | ICD-10-CM | POA: Insufficient documentation

## 2012-07-31 DIAGNOSIS — J4489 Other specified chronic obstructive pulmonary disease: Secondary | ICD-10-CM | POA: Insufficient documentation

## 2012-07-31 MED ORDER — SULFAMETHOXAZOLE-TRIMETHOPRIM 800-160 MG PO TABS: 1.0000 | ORAL_TABLET | Freq: Two times a day (BID) | ORAL | Status: DC

## 2012-07-31 MED ORDER — DIAZEPAM 2 MG PO TABS
2.0000 mg | ORAL_TABLET | Freq: Once | ORAL | Status: AC
  Administered 2012-07-31: 2 mg via ORAL
  Filled 2012-07-31: qty 1

## 2012-07-31 NOTE — ED Notes (Signed)
WRU:[id number]<UJ> Expected date:<BR> Expected time:<BR> Means of arrival:<BR> Comments:<BR> ems- 52 yo female. Infected wound?

## 2012-07-31 NOTE — ED Provider Notes (Signed)
History     CSN: [id number]  Arrival date & time 07/31/12  1402   First MD Initiated Contact with Patient 07/31/12 1407      Chief Complaint  Patient presents with  . Wound Infection    (Consider location/radiation/quality/duration/timing/severity/associated sxs/prior treatment) HPI Comments: Patient is a 52 yo F PMHx significant for HIV, COPD, h/o MRSA infection presenting to the ED after laceration repair on right lower leg seven days ago. Pt has noted erythema surrounding the laceration beginning three days ago along with purulent drainage from wound. Patient denies pain at site of infection or any reinjury to area. Denies fevers, chills, nausea, vomiting, CP, SOB.    Past Medical History  Diagnosis Date  . COPD (chronic obstructive pulmonary disease) 09/14/2010  . 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 H/O MRSA infection 09/14/2010  . Back muscle spasm 12/13/2010  . Psoriasis 12/13/2010  . Cancer   . Asthma     Past Surgical History  Procedure Laterality Date  . Portacath insertion    . Mastectomy      left  . Abdominal hysterectomy    . Cesarean section      x7  . Port-a-cath removal  06/01/2011    Procedure: REMOVAL PORT-A-CATH;  Surgeon: Fabio Bering, MD;  Location: [facility];  Service: General;  Laterality: N/A;  Minor Room  . Mastectomy      Family History  Problem Relation Age of Onset  . Stroke Mother   . Stroke Father   . Cancer Maternal Aunt   . Cancer Maternal Grandmother   . Cancer Maternal Aunt   . Cancer Maternal Aunt     History  Substance Use Topics  . Smoking status: Current Some Day Smoker -- 0.25 packs/day for 36 years  . Smokeless tobacco: Never Used  . Alcohol Use: No    OB History   Grav Para Term Preterm Abortions TAB SAB Ect Mult Living                  Review of Systems  Constitutional: Negative for fever and chills.  Respiratory: Negative for shortness of breath.   Cardiovascular: Negative for chest pain and leg swelling.   Gastrointestinal: Negative for nausea, vomiting and diarrhea.  Skin: Positive for wound.  All other systems reviewed and are negative.    Allergies  Codeine and Penicillins  Home Medications   Current Outpatient Rx  Name  Route  Sig  Dispense  Refill  . ALPRAZolam (XANAX) 0.5 MG tablet   Oral   Take 0.5 mg by mouth 3 (three) times daily as needed for sleep or anxiety.         Marland Kitchen lisinopril (PRINIVIL,ZESTRIL) 10 MG tablet   Oral   Take 20 mg by mouth daily.         Marland Kitchen sulfamethoxazole-trimethoprim (SEPTRA DS) 800-160 MG per tablet   Oral   Take 1 tablet by mouth every 12 (twelve) hours.   20 tablet   0     BP 148/79  Pulse 72  Temp(Src) 98.6 F (37 C) (Oral)  Resp 20  SpO2 98%  Physical Exam  Constitutional: She is oriented to person, place, and time. She appears well-developed and well-nourished.  HENT:  Head: Normocephalic and atraumatic.  Mouth/Throat: Oropharynx is clear and moist.  Eyes: Conjunctivae are normal.  Cardiovascular: Normal rate, regular rhythm and normal heart sounds.   Pulmonary/Chest: Effort normal and breath sounds normal.  Abdominal: Soft. Bowel sounds are normal.  Neurological: She is alert and oriented to person, place, and time.  Skin: Skin is warm and dry. There is erythema.  1 cm erythema surrounding laceration on right shin. No drainage noted.   Psychiatric: Her mood appears anxious.    ED Course  Procedures (including critical care time)  Patient declined any lab draws or imaging.   Labs Reviewed - No data to display No results found.   1. Wound infection, initial encounter       MDM  Pt s/p laceration repair w/ 1cm surrounding erythema around laceration w/ no visible drainage. No labs or imaging were done at this visit d/t patient refusal. VSS. Pt d/c w/ Abx for infection. PCP f/u advised. Patient d/w with Dr. Rubin Payor, agrees with plan. Patient is stable at time of discharge          Jeannetta Ellis,  PA-C 07/31/12 2009

## 2012-07-31 NOTE — ED Notes (Signed)
Per EMS: pt was here last week and had stitches in her right lower leg and now states its infected. Pt also has rashes on chest.

## 2012-08-02 NOTE — ED Provider Notes (Signed)
Medical screening examination/treatment/procedure(s) were performed by non-physician practitioner and as supervising physician I was immediately available for consultation/collaboration.  Juliet Rude. Rubin Payor, MD 08/02/12 1407

## 2012-09-04 ENCOUNTER — Telehealth (HOSPITAL_COMMUNITY): Payer: Self-pay | Admitting: Oncology

## 2012-09-04 DIAGNOSIS — C50919 Malignant neoplasm of unspecified site of unspecified female breast: Secondary | ICD-10-CM

## 2012-09-04 MED ORDER — ALPRAZOLAM 0.5 MG PO TABS: 0.5000 mg | ORAL_TABLET | Freq: Three times a day (TID) | ORAL | Status: DC | PRN

## 2012-09-04 NOTE — Telephone Encounter (Signed)
[name] went on a trip and lost her Xanax.  Her little grandchild dumped them into a pool while on the trip.   I called [facility] and called in 90 tablets with 2 refills.  She will have to pay out of pocket for the medication and the total will be a little over $16.   Jvon Meroney

## 2012-10-05 ENCOUNTER — Emergency Department (HOSPITAL_COMMUNITY)
Admission: EM | Admit: 2012-10-05 | Discharge: 2012-10-05 | Disposition: A | Discharge status: Home / Self Care | Payer: Medicaid Other | Attending: Emergency Medicine | Admitting: Emergency Medicine

## 2012-10-05 ENCOUNTER — Emergency Department (HOSPITAL_COMMUNITY): Payer: Medicaid Other

## 2012-10-05 ENCOUNTER — Encounter (HOSPITAL_COMMUNITY): Payer: Self-pay

## 2012-10-05 DIAGNOSIS — R296 Repeated falls: Secondary | ICD-10-CM | POA: Insufficient documentation

## 2012-10-05 DIAGNOSIS — Z79899 Other long term (current) drug therapy: Secondary | ICD-10-CM | POA: Insufficient documentation

## 2012-10-05 DIAGNOSIS — Z872 Personal history of diseases of the skin and subcutaneous tissue: Secondary | ICD-10-CM | POA: Insufficient documentation

## 2012-10-05 DIAGNOSIS — S39012A Strain of muscle, fascia and tendon of lower back, initial encounter: Secondary | ICD-10-CM

## 2012-10-05 DIAGNOSIS — S335XXA Sprain of ligaments of lumbar spine, initial encounter: Secondary | ICD-10-CM | POA: Insufficient documentation

## 2012-10-05 DIAGNOSIS — R21 Rash and other nonspecific skin eruption: Secondary | ICD-10-CM | POA: Insufficient documentation

## 2012-10-05 DIAGNOSIS — S0990XA Unspecified injury of head, initial encounter: Secondary | ICD-10-CM | POA: Insufficient documentation

## 2012-10-05 DIAGNOSIS — Z8589 Personal history of malignant neoplasm of other organs and systems: Secondary | ICD-10-CM | POA: Insufficient documentation

## 2012-10-05 DIAGNOSIS — R112 Nausea with vomiting, unspecified: Secondary | ICD-10-CM | POA: Insufficient documentation

## 2012-10-05 DIAGNOSIS — Z880 Allergy status to penicillin: Secondary | ICD-10-CM | POA: Insufficient documentation

## 2012-10-05 DIAGNOSIS — J449 Chronic obstructive pulmonary disease, unspecified: Secondary | ICD-10-CM | POA: Insufficient documentation

## 2012-10-05 DIAGNOSIS — J45909 Unspecified asthma, uncomplicated: Secondary | ICD-10-CM | POA: Insufficient documentation

## 2012-10-05 DIAGNOSIS — Z8739 Personal history of other diseases of the musculoskeletal system and connective tissue: Secondary | ICD-10-CM | POA: Insufficient documentation

## 2012-10-05 DIAGNOSIS — Z8614 Personal history of Methicillin resistant Staphylococcus aureus infection: Secondary | ICD-10-CM | POA: Insufficient documentation

## 2012-10-05 DIAGNOSIS — W19XXXA Unspecified fall, initial encounter: Secondary | ICD-10-CM

## 2012-10-05 DIAGNOSIS — J4489 Other specified chronic obstructive pulmonary disease: Secondary | ICD-10-CM | POA: Insufficient documentation

## 2012-10-05 DIAGNOSIS — F1210 Cannabis abuse, uncomplicated: Secondary | ICD-10-CM | POA: Insufficient documentation

## 2012-10-05 DIAGNOSIS — S139XXA Sprain of joints and ligaments of unspecified parts of neck, initial encounter: Secondary | ICD-10-CM | POA: Insufficient documentation

## 2012-10-05 DIAGNOSIS — Y9289 Other specified places as the place of occurrence of the external cause: Secondary | ICD-10-CM | POA: Insufficient documentation

## 2012-10-05 DIAGNOSIS — Y939 Activity, unspecified: Secondary | ICD-10-CM | POA: Insufficient documentation

## 2012-10-05 DIAGNOSIS — S161XXA Strain of muscle, fascia and tendon at neck level, initial encounter: Secondary | ICD-10-CM

## 2012-10-05 DIAGNOSIS — I10 Essential (primary) hypertension: Secondary | ICD-10-CM | POA: Insufficient documentation

## 2012-10-05 HISTORY — DX: Essential (primary) hypertension: I10

## 2012-10-05 LAB — CBC WITH DIFFERENTIAL/PLATELET
Basophils Relative: 0 % (ref 0–1)
Eosinophils Absolute: 0.1 10*3/uL (ref 0.0–0.7)
Eosinophils Relative: 2 % (ref 0–5)
Hemoglobin: 13.9 g/dL (ref 12.0–15.0)
Lymphs Abs: 2.3 10*3/uL (ref 0.7–4.0)
MCH: 31.2 pg (ref 26.0–34.0)
MCHC: 33 g/dL (ref 30.0–36.0)
MCV: 94.4 fL (ref 78.0–100.0)
Monocytes Relative: 4 % (ref 3–12)
Neutrophils Relative %: 63 % (ref 43–77)
Platelets: 226 10*3/uL (ref 150–400)
RBC: 4.46 MIL/uL (ref 3.87–5.11)

## 2012-10-05 LAB — BASIC METABOLIC PANEL
BUN: 12 mg/dL (ref 6–23)
Calcium: 9.8 mg/dL (ref 8.4–10.5)
Creatinine, Ser: 0.64 mg/dL (ref 0.50–1.10)
GFR calc Af Amer: >90 mL/min (ref >90)
GFR calc non Af Amer: >90 mL/min (ref >90)
Glucose, Bld: 97 mg/dL (ref 70–99)

## 2012-10-05 MED ORDER — SODIUM CHLORIDE 0.9 % IV BOLUS (SEPSIS)
250.0000 mL | Freq: Once | INTRAVENOUS | Status: AC
  Administered 2012-10-05: 250 mL via INTRAVENOUS

## 2012-10-05 MED ORDER — HYDROCODONE-ACETAMINOPHEN 5-325 MG PO TABS: 1.0000 | ORAL_TABLET | Freq: Four times a day (QID) | ORAL | Status: DC | PRN

## 2012-10-05 MED ORDER — HYDROMORPHONE HCL PF 1 MG/ML IJ SOLN
1.0000 mg | Freq: Once | INTRAMUSCULAR | Status: AC
  Administered 2012-10-05: 1 mg via INTRAVENOUS
  Filled 2012-10-05: qty 1

## 2012-10-05 MED ORDER — ONDANSETRON HCL 4 MG/2ML IJ SOLN
4.0000 mg | Freq: Once | INTRAMUSCULAR | Status: AC
  Administered 2012-10-05: 4 mg via INTRAVENOUS
  Filled 2012-10-05: qty 2

## 2012-10-05 MED ORDER — SODIUM CHLORIDE 0.9 % IV SOLN: INTRAVENOUS | Status: DC

## 2012-10-05 MED ORDER — IOHEXOL 300 MG/ML  SOLN
100.0000 mL | Freq: Once | INTRAMUSCULAR | Status: AC | PRN
  Administered 2012-10-05: 100 mL via INTRAVENOUS

## 2012-10-05 NOTE — ED Notes (Signed)
Awaiting discharge paperwork

## 2012-10-05 NOTE — ED Notes (Signed)
Patient with no complaints at this time. Respirations even and unlabored. Skin warm/dry. Discharge instructions reviewed with patient at this time. Patient given opportunity to voice concerns/ask questions. IV removed by patient prior to RN entering room with discharge instructions. Patient discharged at this time and left Emergency Department with steady gait.

## 2012-10-05 NOTE — ED Notes (Signed)
MD at bedside. 

## 2012-10-05 NOTE — ED Provider Notes (Signed)
CSN: [id number]     Arrival date & time 10/05/12  1914 History  This chart was scribed for Natalie Jakes, MD by Bennett Scrape, ED Scribe. This patient was seen in room [facility]14/[facility]14 and the patient's care was started at 9:42 AM.   Chief Complaint  Patient presents with  . Fall    Patient is a 52 y.o. female presenting with fall. The history is provided by the patient. No language interpreter was used.  Fall This is a new problem. The current episode started yesterday. Episode frequency: once. The problem has been resolved. Associated symptoms include headaches. Pertinent negatives include no chest pain, no abdominal pain and no shortness of breath. The symptoms are aggravated by walking (associated pains are). Nothing relieves the symptoms. She has tried nothing for the symptoms.    HPI Comments: [name] is a 52 y.o. female who presents to the Emergency Department complaining of a fall from a second story hay loft in a barn yesterday. Pt states that she went up into the loft of her barn to get hay and fell landing on a pile of hay below. She denies LOC. She states that she felt SOB described as having the wind knocked out of her after the incident that resolved. She states that she experienced sudden onset neck pain, back pain and bilateral upper extremity pain after the incident with intermittent spasms in the lower extremities, nausea, emesis and HA afterwards. She admits that she has been able to ambulate since the incident. She states that she tried to take a warm bath and was given a Klonopin by a church friend with improvement in the spasms. She is here today for gradually worsening symptoms. She states that she did not try OTC medications for her symptoms. She denies CP, abdominal pain and extremity numbness.  No PCP  Past Medical History  Diagnosis Date  . COPD (chronic obstructive pulmonary disease) 09/14/2010  . 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 H/O MRSA  infection 09/14/2010  . Back muscle spasm 12/13/2010  . Psoriasis 12/13/2010  . Cancer   . Asthma   . Hypertension    Past Surgical History  Procedure Laterality Date  . Portacath insertion    . Mastectomy      left  . Abdominal hysterectomy    . Cesarean section      x7  . Port-a-cath removal  06/01/2011    Procedure: REMOVAL PORT-A-CATH;  Surgeon: Fabio Bering, MD;  Location: [facility];  Service: General;  Laterality: N/A;  Minor Room  . Mastectomy     Family History  Problem Relation Age of Onset  . Stroke Mother   . Stroke Father   . Cancer Maternal Aunt   . Cancer Maternal Grandmother   . Cancer Maternal Aunt   . Cancer Maternal Aunt    History  Substance Use Topics  . Smoking status: Current Some Day Smoker -- 0.25 packs/day for 36 years  . Smokeless tobacco: Never Used  . Alcohol Use: No   No OB history provided.  Review of Systems  Constitutional: Negative for fever and chills.  HENT: Positive for neck pain. Negative for congestion and sore throat.   Eyes: Negative for visual disturbance.  Respiratory: Negative for cough and shortness of breath.   Cardiovascular: Positive for leg swelling (mild bilaterally). Negative for chest pain.  Gastrointestinal: Positive for nausea and vomiting. Negative for abdominal pain, diarrhea and blood in stool.  Genitourinary: Negative for dysuria and  hematuria.  Musculoskeletal: Positive for myalgias and back pain.  Skin: Positive for rash (the right fourth toe, to chest for past 4 months).  Neurological: Positive for headaches. Negative for weakness and numbness.  Hematological: Does not bruise/bleed easily.  Psychiatric/Behavioral: Negative for confusion.    Allergies  Codeine and Penicillins-natural codeine per pt at bedside  Home Medications   Current Outpatient Rx  Name  Route  Sig  Dispense  Refill  . lisinopril (PRINIVIL,ZESTRIL) 10 MG tablet   Oral   Take 20 mg by mouth daily.         Marland Kitchen  HYDROcodone-acetaminophen (NORCO/VICODIN) 5-325 MG per tablet   Oral   Take 1-2 tablets by mouth every 6 (six) hours as needed for pain.   20 tablet   0    Triage Vitals: BP 165/98  Pulse 92  Temp(Src) 97.7 F (36.5 C) (Oral)  Resp 17  Ht 5\' 9"  (1.753 m)  Wt 230 lb (104.327 kg)  BMI 33.95 kg/m2  SpO2 95%  Physical Exam  Nursing note and vitals reviewed. Constitutional: She is oriented to person, place, and time. She appears well-developed and well-nourished. No distress.  HENT:  Head: Normocephalic and atraumatic.  Mouth/Throat: Oropharynx is clear and moist.  Eyes: Conjunctivae and EOM are normal. Pupils are equal, round, and reactive to light.  Sclera are clear  Neck: Neck supple. No tracheal deviation present.  Cardiovascular: Normal rate and regular rhythm.   No murmur heard. Pulses:      Dorsalis pedis pulses are 2+ on the right side, and 2+ on the left side.  Pulmonary/Chest: Effort normal and breath sounds normal. No respiratory distress. She has no wheezes.  Abdominal: Soft. Bowel sounds are normal. She exhibits no distension. There is no tenderness.  Musculoskeletal: Normal range of motion. She exhibits no edema (no ankle swelling).  Tenderness to palpation of the T-spine and L-spine diffusely, no ecchymosis  Lymphadenopathy:    She has no cervical adenopathy.  Neurological: She is alert and oriented to person, place, and time. No cranial nerve deficit.  Pt able to move both sets of fingers and toes  Skin: Skin is warm and dry. No rash noted.  Excoriated area with surrounding redness between the fourth and fifth right toes. No papules   Psychiatric: She has a normal mood and affect. Her behavior is normal.    ED Course   DIAGNOSTIC STUDIES: Oxygen Saturation is 95% on room air, adequate by my interpretation.    COORDINATION OF CARE: 9:46 AM-Discussed treatment plan which includes CT of head, chest, neck and back with pt at bedside and pt agreed to plan.  Informed pt that the rash is athlete's foot and advised pt to use OTC creams to improve symptoms. Also advised pt to use OTC antiinflammatories to help improve symptoms.  10:00 AM-Observed pt ambulating around her room without difficulty  Procedures (including critical care time)  Labs Reviewed  CBC WITH DIFFERENTIAL  BASIC METABOLIC PANEL   Ct Head Wo Contrast  10/05/2012   *RADIOLOGY REPORT*  Clinical Data:  Larey Seat off roof.  CT HEAD WITHOUT CONTRAST CT CERVICAL SPINE WITHOUT CONTRAST  Technique:  Multidetector CT imaging of the head and cervical spine was performed following the standard protocol without intravenous contrast.  Multiplanar CT image reconstructions of the cervical spine were also generated.  Comparison:   None  CT HEAD  Findings: The ventricles are normal.  No extra-axial fluid collections are seen.  The brainstem and cerebellum are unremarkable.  No acute intracranial findings such as infarction or hemorrhage.  No mass lesions.  No acute bony findings.  No skull fracture.  There is a ethmoid, maxillary and sphenoid sinus disease.  The mastoid air cells and middle ear cavities are clear.  IMPRESSION: No acute intracranial findings or skull fracture.  CT CERVICAL SPINE  Findings: The sagittal reformatted images demonstrate normal alignment of the cervical vertebral bodies.  Disc spaces and vertebral bodies are maintained.  No acute bony findings or abnormal prevertebral soft tissue swelling.  The facets are normally aligned.  No facet or laminar fractures are seen. No large disc protrusions.  The neural foramen are patent.  The skull base C1 and C1-C2 articulations are maintained.  The dens is normal.  There are scattered cervical lymph nodes.  The lung apices are clear.  IMPRESSION: Normal alignment and no acute bony findings.   Original Report Authenticated By: Rudie Meyer, M.D.   Ct Chest W Contrast  10/05/2012   *RADIOLOGY REPORT*  Clinical Data:  Larey Seat off roof yesterday.  Mid  abdominal and back pain.  CT CHEST, ABDOMEN AND PELVIS WITH CONTRAST  Technique:  Multidetector CT imaging of the chest, abdomen and pelvis was performed following the standard protocol during bolus administration of intravenous contrast.  Contrast: OMNIPAQUE IOHEXOL 300 MG/ML  SOLN  Comparison:   None.  CT CHEST  Findings:  The chest wall is unremarkable.  No breast masses, hematoma, supraclavicular or axillary mass or adenopathy.  Small thyroid gland nodules are noted.  The bony thorax is intact.  The thoracic vertebral bodies are normally aligned.  No acute compression fracture.  The facets are normally aligned.  The sternum is intact.  No definite rib fractures.  The heart is normal in size.  No pericardial effusion.  No mediastinal or hilar mass, adenopathy or hematoma.  The aorta demonstrates moderate atherosclerotic calcifications for age.  This also atherosclerotic disease involving the origin of the left subclavian artery with a small amount of mural thrombus.  The esophagus is grossly normal.  Examination of the lung parenchyma demonstrates moderate breathing motion artifact.  No infiltrates, pulmonary contusion and/or pleural effusion.  No pneumothorax.  IMPRESSION:  1.  No acute findings in the chest. 2.  Moderate atherosclerotic changes for age. 3.  Small thyroid nodules.  CT ABDOMEN AND PELVIS  Findings:  Mild diffuse fatty infiltration of the liver but no focal hepatic lesions.  Moderate streak artifact because of the arms being down by her side.  No definite acute hepatic injury and no Perry hepatic fluid collections.  The gallbladder is normal.  No common bile duct dilatation.  The pancreas is normal.  The spleen demonstrates two low attenuation lesions which are likely benign cysts or hemangiomas.  No acute splenic injury.  The adrenal glands and kidneys are normal.  The stomach, duodenum, small bowel and colon are grossly normal without oral contrast.  No inflammatory changes or mass  lesions. Moderate to advanced atherosclerotic calcifications for age.  No aneurysm or dissection.  The major branch vessels are patent.  No mesenteric or retroperitoneal mass, adenopathy or hematoma.  The bladder is normal.  The uterus is surgically absent.  No pelvic mass or pelvic hematoma.  No inguinal mass or hematoma.  The bony structures are intact.  IMPRESSION: No acute abdominal/pelvic findings.   Original Report Authenticated By: Rudie Meyer, M.D.   Ct Cervical Spine Wo Contrast  10/05/2012   *RADIOLOGY REPORT*  Clinical Data:  Larey Seat off roof.  CT HEAD WITHOUT CONTRAST CT CERVICAL SPINE WITHOUT CONTRAST  Technique:  Multidetector CT imaging of the head and cervical spine was performed following the standard protocol without intravenous contrast.  Multiplanar CT image reconstructions of the cervical spine were also generated.  Comparison:   None  CT HEAD  Findings: The ventricles are normal.  No extra-axial fluid collections are seen.  The brainstem and cerebellum are unremarkable.  No acute intracranial findings such as infarction or hemorrhage.  No mass lesions.  No acute bony findings.  No skull fracture.  There is a ethmoid, maxillary and sphenoid sinus disease.  The mastoid air cells and middle ear cavities are clear.  IMPRESSION: No acute intracranial findings or skull fracture.  CT CERVICAL SPINE  Findings: The sagittal reformatted images demonstrate normal alignment of the cervical vertebral bodies.  Disc spaces and vertebral bodies are maintained.  No acute bony findings or abnormal prevertebral soft tissue swelling.  The facets are normally aligned.  No facet or laminar fractures are seen. No large disc protrusions.  The neural foramen are patent.  The skull base C1 and C1-C2 articulations are maintained.  The dens is normal.  There are scattered cervical lymph nodes.  The lung apices are clear.  IMPRESSION: Normal alignment and no acute bony findings.   Original Report Authenticated By: Rudie Meyer, M.D.   Ct Abdomen Pelvis W Contrast  10/05/2012   *RADIOLOGY REPORT*  Clinical Data:  Larey Seat off roof yesterday.  Mid abdominal and back pain.  CT CHEST, ABDOMEN AND PELVIS WITH CONTRAST  Technique:  Multidetector CT imaging of the chest, abdomen and pelvis was performed following the standard protocol during bolus administration of intravenous contrast.  Contrast: OMNIPAQUE IOHEXOL 300 MG/ML  SOLN  Comparison:   None.  CT CHEST  Findings:  The chest wall is unremarkable.  No breast masses, hematoma, supraclavicular or axillary mass or adenopathy.  Small thyroid gland nodules are noted.  The bony thorax is intact.  The thoracic vertebral bodies are normally aligned.  No acute compression fracture.  The facets are normally aligned.  The sternum is intact.  No definite rib fractures.  The heart is normal in size.  No pericardial effusion.  No mediastinal or hilar mass, adenopathy or hematoma.  The aorta demonstrates moderate atherosclerotic calcifications for age.  This also atherosclerotic disease involving the origin of the left subclavian artery with a small amount of mural thrombus.  The esophagus is grossly normal.  Examination of the lung parenchyma demonstrates moderate breathing motion artifact.  No infiltrates, pulmonary contusion and/or pleural effusion.  No pneumothorax.  IMPRESSION:  1.  No acute findings in the chest. 2.  Moderate atherosclerotic changes for age. 3.  Small thyroid nodules.  CT ABDOMEN AND PELVIS  Findings:  Mild diffuse fatty infiltration of the liver but no focal hepatic lesions.  Moderate streak artifact because of the arms being down by her side.  No definite acute hepatic injury and no Perry hepatic fluid collections.  The gallbladder is normal.  No common bile duct dilatation.  The pancreas is normal.  The spleen demonstrates two low attenuation lesions which are likely benign cysts or hemangiomas.  No acute splenic injury.  The adrenal glands and kidneys are  normal.  The stomach, duodenum, small bowel and colon are grossly normal without oral contrast.  No inflammatory changes or mass lesions. Moderate to advanced atherosclerotic calcifications for age.  No aneurysm or dissection.  The major branch vessels are patent.  No  mesenteric or retroperitoneal mass, adenopathy or hematoma.  The bladder is normal.  The uterus is surgically absent.  No pelvic mass or pelvic hematoma.  No inguinal mass or hematoma.  The bony structures are intact.  IMPRESSION: No acute abdominal/pelvic findings.   Original Report Authenticated By: Rudie Meyer, M.D.   Results for orders placed during the hospital encounter of 10/05/12  CBC WITH DIFFERENTIAL      Result Value Range   WBC 7.4  4.0 - 10.5 K/uL   RBC 4.46  3.87 - 5.11 MIL/uL   Hemoglobin 13.9  12.0 - 15.0 g/dL   HCT 16.1  09.6 - 04.5 %   MCV 94.4  78.0 - 100.0 fL   MCH 31.2  26.0 - 34.0 pg   MCHC 33.0  30.0 - 36.0 g/dL   RDW 40.9  81.1 - 91.4 %   Platelets 226  150 - 400 K/uL   Neutrophils Relative % 63  43 - 77 %   Neutro Abs 4.7  1.7 - 7.7 K/uL   Lymphocytes Relative 31  12 - 46 %   Lymphs Abs 2.3  0.7 - 4.0 K/uL   Monocytes Relative 4  3 - 12 %   Monocytes Absolute 0.3  0.1 - 1.0 K/uL   Eosinophils Relative 2  0 - 5 %   Eosinophils Absolute 0.1  0.0 - 0.7 K/uL   Basophils Relative 0  0 - 1 %   Basophils Absolute 0.0  0.0 - 0.1 K/uL  BASIC METABOLIC PANEL      Result Value Range   Sodium 138  135 - 145 mEq/L   Potassium 4.0  3.5 - 5.1 mEq/L   Chloride 100  96 - 112 mEq/L   CO2 30  19 - 32 mEq/L   Glucose, Bld 97  70 - 99 mg/dL   BUN 12  6 - 23 mg/dL   Creatinine, Ser 7.82  0.50 - 1.10 mg/dL   Calcium 9.8  8.4 - 95.6 mg/dL   GFR calc non Af Amer >90  >90 mL/min   GFR calc Af Amer >90  >90 mL/min     1. Fall, initial encounter   2. Cervical strain, acute, initial encounter   3. Lumbar strain, initial encounter     MDM  Patient with a fall from a significant height. Patient with complaint of  head pain neck pain back pain had some abdominal vomiting yesterday. So when head did pain CT to rule out any significant traumatic injuries. All negative. Patient's abdomen is soft nontender today. Main symptoms have been cervical neck pain and lumbar back pain. Will treat with pain medication resource guide for followup.  I personally performed the services described in this documentation, which was scribed in my presence. The recorded information has been reviewed and is accurate.     Natalie Jakes, MD 10/05/12 1240

## 2012-10-05 NOTE — ED Notes (Signed)
Pt reports falling from a hay loft yesterday and landed on her back.  Pt denies any LOC but reports "it knocked the breath out of me and I did hit my head".  Pt reports severe pain all over her body and spasm to her legs bilaterally.

## 2012-10-05 NOTE — ED Notes (Signed)
Patient requesting food. RN informed that she cannot eat at present and all imaging needs to be resulted prior to being able to eat.

## 2012-10-18 ENCOUNTER — Ambulatory Visit (HOSPITAL_COMMUNITY): Payer: Medicaid Other | Admitting: Oncology

## 2012-10-18 NOTE — Progress Notes (Signed)
-  No show, letter sent-   

## 2012-11-22 ENCOUNTER — Encounter (HOSPITAL_COMMUNITY): Payer: Self-pay | Admitting: Oncology

## 2012-11-23 ENCOUNTER — Other Ambulatory Visit (HOSPITAL_COMMUNITY): Payer: Self-pay | Admitting: Oncology

## 2012-11-28 ENCOUNTER — Other Ambulatory Visit (HOSPITAL_COMMUNITY): Payer: Self-pay | Admitting: Oncology

## 2012-11-28 DIAGNOSIS — R110 Nausea: Secondary | ICD-10-CM

## 2012-11-28 MED ORDER — PROCHLORPERAZINE MALEATE 10 MG PO TABS: 10.0000 mg | ORAL_TABLET | Freq: Four times a day (QID) | ORAL | Status: DC | PRN

## 2012-12-06 NOTE — Progress Notes (Signed)
No PCP Per Patient [address] [place] [address]  Breast cancer, left  H/O MRSA infection - Plan: doxycycline (VIBRA-TABS) 100 MG tablet, sulfamethoxazole-trimethoprim (BACTRIM DS,SEPTRA DS) 800-160 MG per tablet  ANXIETY - Plan: ALPRAZolam (XANAX) 0.5 MG tablet  Hyperlipidemia  CURRENT THERAPY: Surveillance per NCCN guidelines.  INTERVAL HISTORY: [name] 53 y.o. 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 All therapy was completed on 04/18/2008.  Details are documented in Oncology History on Problem list.   NCCN guidelines for triple negative, invasive breast cancer are reviewed.  NCCN guidelines recommend H+P every 4-6 months x 5 years and then annually.  She recently acquired a PCP and her name is [employer] operational [employer].  She had blood work recently by her and she was told her cholesterol was elevated.  This will require intervention and her new PCP was prescribed a medication for this disorder.  Additionally, she notes that her Lisinopril was recently increase by PCP and will defer BP management to PCP.  She is caring for her grand babies.  4 in total.  She is doing well but this is stressful for her.  She is doing her best within her means.  She reports that she awakes at 430 AM to get everything around for the children.  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  I am not too sure how good of a reader [name] is and I suspect this is taxing for her as she is not very intelligent.    With this, she reports that she is stressed and asks if she can take 4 xanaxs per day.  We spent a lot of time discussing this change.  I have also discussed switching her to a longer-acting anxiolytic such as Ativan, but she reports intolerance to Ativan.  I suspect she is truthful and I will increase to 0.5 mg QID PRN but she was educated that I will not increase any further.  She is agreeable.  She reports that her smoking  has decreased.  She reports that 1 pack of cigarettes lasts her 6 days which is much improved for her.  She also admits that she quite marijuana usage.  She complains of a chest rash.  She denies any new medications.  She reports that it began about 1 month ago and is slowly improving, but it is impressive on examination today.  It is acne-like in appearance, sparing other parts of body.  She has a strong history of MRSA skin infections and I will cover for that.  Oncologically, she denies any complaints and ROS questioning is negative.    Past Medical History  Diagnosis Date  . COPD (chronic obstructive pulmonary disease) 09/14/2010  . 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 H/O MRSA infection 09/14/2010  . Back muscle spasm 12/13/2010  . Psoriasis 12/13/2010  . Cancer   . Asthma   . Hypertension   . High blood cholesterol   . Hyperlipidemia 12/07/2012    has ANXIETY; DEPRESSION; HYPERTENSION; H/O Stage III Breast cancer, left; COPD (chronic obstructive pulmonary disease); Marijuana abuse in the past; H/O MRSA infection; Psoriasis; and Hyperlipidemia on her problem list.     is allergic to codeine and penicillins.  [name] had no medications administered during this visit.  Past Surgical History  Procedure Laterality Date  . Portacath insertion    . Mastectomy      left  . Abdominal  hysterectomy    . Cesarean section      x7  . Port-a-cath removal  06/01/2011    Procedure: REMOVAL PORT-A-CATH;  Surgeon: Fabio Bering, MD;  Location: [facility];  Service: General;  Laterality: N/A;  Minor Room  . Mastectomy      Denies any headaches, dizziness, double vision, fevers, chills, night sweats, nausea, vomiting, diarrhea, constipation, chest pain, heart palpitations, shortness of breath, blood in stool, black tarry stool, urinary pain, urinary burning, urinary frequency, hematuria.   PHYSICAL EXAMINATION  ECOG PERFORMANCE STATUS: 0 - Asymptomatic  Filed Vitals:    12/07/12 1426  BP: 186/120  Pulse: 98  Temp: 98.6 F (37 C)  Resp: 18    GENERAL:alert, no distress, well nourished, well developed, comfortable, cooperative, obese and smiling SKIN: skin color, texture, turgor are normal.  Pustular rash diffusely on chest wall with acne-like resemblance.  HEAD: Normocephalic, No masses, lesions, tenderness or abnormalities EYES: normal, PERRLA, EOMI, Conjunctiva are pink and non-injected EARS: External ears normal OROPHARYNX:mucous membranes are moist  NECK: supple, no adenopathy, thyroid normal size, non-tender, without nodularity, no stridor, non-tender, trachea midline LYMPH:  no palpable lymphadenopathy, no hepatosplenomegaly BREAST:B/L post-mastectomy site well healed and free of suspicious changes LUNGS: clear to auscultation and percussion HEART: regular rate & rhythm, no murmurs, no gallops, S1 normal and S2 normal ABDOMEN:abdomen soft, non-tender, obese, normal bowel sounds, no masses or organomegaly and no hepatosplenomegaly BACK: Back symmetric, no curvature., No CVA tenderness EXTREMITIES:less then 2 second capillary refill, no joint deformities, effusion, or inflammation, no edema, no skin discoloration, no clubbing, no cyanosis  NEURO: alert & oriented x 3 with fluent speech, no focal motor/sensory deficits, gait normal    PATHOLOGY: 1. Breast, left, modified radical mastectomy- high grade invasive ductal carcinoma, 2.5 cm, clear resection margins, 0/10 lymph nodes for metastatic disease, grade III, No LVI, ER/PR negative, Ki-67 22%, Her2 negative.  2. Right breast, simple mastectomy- changes consistent with chemotherapy treatment, fibrocystic changes, no malignancy     ASSESSMENT:  1. Stage III, poorly differentiated, triple negative breast cancer, S/P chemotherapy and mastectomy. She is 5 year out from therapy this Feb 2015 2. Anxiety  3. Psoriasis  4. Back muscle spasm  5. Non-compliance  6. Marijuana usage, patient quit in  2014 7. MRSA carrier  8. Poor hygeine 9. Follicular rash on chest 10. HTN 11. Tobacco abuse  Patient Active Problem List   Diagnosis Date Noted  . H/O Stage III Breast cancer, left 05/13/2009    Priority: Medium  . Hyperlipidemia 12/07/2012  . Psoriasis 12/13/2010  . COPD (chronic obstructive pulmonary disease) 09/14/2010  . 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 H/O MRSA infection 09/14/2010  . ANXIETY 05/13/2009  . DEPRESSION 05/13/2009  . HYPERTENSION 05/13/2009      PLAN:  1. Patient education regarding her breast cancer. 2. Review of NCCN guidelines 3. Increase Xanax to 0.5 mg QID PRN, #120 with 3 refills. 4. Rx for Bactrim-DS BID x 10 days 5. Rx for Doxycycline BID x 10 days 6. Healthy living style discussed 7. Encouraged follow-up with PCP. 8. Smoking cessation education provided 9. Return in 6 months and then annually thereafter if no issues.   THERAPY PLAN: We will follow NCCN guidelines of surveillance.  NCCN guidelines for triple negative, invasive breast cancer are reviewed.  NCCN guidelines recommend H+P every 4-6 months x 5 years and then annually.   All questions were answered. The patient knows to call the clinic with any  problems, questions or concerns. We can certainly see the patient much sooner if necessary.  Patient and plan discussed with Dr. Alla German and he is in agreement with the aforementioned.   Kayte Borchard

## 2012-12-07 ENCOUNTER — Encounter (HOSPITAL_COMMUNITY): Payer: Medicaid Other | Attending: Oncology | Admitting: Oncology

## 2012-12-07 ENCOUNTER — Encounter (HOSPITAL_COMMUNITY): Payer: Self-pay | Admitting: Oncology

## 2012-12-07 VITALS — BP 186/120 | HR 98 | Temp 98.6°F | Resp 18 | Wt 215.8 lb

## 2012-12-07 DIAGNOSIS — C50419 Malignant neoplasm of upper-outer quadrant of unspecified female breast: Secondary | ICD-10-CM

## 2012-12-07 DIAGNOSIS — E785 Hyperlipidemia, unspecified: Secondary | ICD-10-CM

## 2012-12-07 DIAGNOSIS — Z171 Estrogen receptor negative status [ER-]: Secondary | ICD-10-CM

## 2012-12-07 DIAGNOSIS — F411 Generalized anxiety disorder: Secondary | ICD-10-CM

## 2012-12-07 DIAGNOSIS — Z720 Tobacco use: Secondary | ICD-10-CM | POA: Insufficient documentation

## 2012-12-07 DIAGNOSIS — A4902 Methicillin resistant Staphylococcus aureus infection, unspecified site: Secondary | ICD-10-CM

## 2012-12-07 DIAGNOSIS — R21 Rash and other nonspecific skin eruption: Secondary | ICD-10-CM

## 2012-12-07 DIAGNOSIS — C50912 Malignant neoplasm of unspecified site of left female breast: Secondary | ICD-10-CM

## 2012-12-07 DIAGNOSIS — I10 Essential (primary) hypertension: Secondary | ICD-10-CM

## 2012-12-07 DIAGNOSIS — F17200 Nicotine dependence, unspecified, uncomplicated: Secondary | ICD-10-CM

## 2012-12-07 HISTORY — DX: Tobacco use: Z72.0

## 2012-12-07 HISTORY — DX: Hyperlipidemia, unspecified: E78.5

## 2012-12-07 MED ORDER — SULFAMETHOXAZOLE-TRIMETHOPRIM 800-160 MG PO TABS: 1.0000 | ORAL_TABLET | Freq: Two times a day (BID) | ORAL | Status: DC

## 2012-12-07 MED ORDER — ALPRAZOLAM 0.5 MG PO TABS: 0.5000 mg | ORAL_TABLET | Freq: Four times a day (QID) | ORAL | Status: DC | PRN

## 2012-12-07 MED ORDER — DOXYCYCLINE HYCLATE 100 MG PO TABS: 100.0000 mg | ORAL_TABLET | Freq: Two times a day (BID) | ORAL | Status: DC

## 2012-12-07 NOTE — Patient Instructions (Signed)
[facility] Discharge Instructions  RECOMMENDATIONS MADE BY THE CONSULTANT AND ANY TEST RESULTS WILL BE SENT TO YOUR REFERRING PHYSICIAN.  EXAM FINDINGS BY THE PHYSICIAN TODAY AND SIGNS OR SYMPTOMS TO REPORT TO CLINIC OR PRIMARY PHYSICIAN: Exam and findings as discussed by Dellis Anes PA-C.  No evidence of recurrence by exam.Report any new lumps, bone pain, shortness of breath or other symptoms.  MEDICATIONS PRESCRIBED:  Xanax - take as directed Doxycycline - take as directed Bactrim - Take as directed  INSTRUCTIONS/FOLLOW-UP: Follow-up in 6 months.  Thank you for choosing [facility] to provide your oncology and hematology care.  To afford each patient quality time with our providers, please arrive at least 15 minutes before your scheduled appointment time.  With your help, our goal is to use those 15 minutes to complete the necessary work-up to ensure our physicians have the information they need to help with your evaluation and healthcare recommendations.    Effective January 1st, 2014, we ask that you re-schedule your appointment with our physicians should you arrive 10 or more minutes late for your appointment.  We strive to give you quality time with our providers, and arriving late affects you and other patients whose appointments are after yours.    Again, thank you for choosing [facility].  Our hope is that these requests will decrease the amount of time that you wait before being seen by our physicians.       _____________________________________________________________  Should you have questions after your visit to [facility], please contact our office at [phone] between the hours of 8:30 a.m. and 5:00 p.m.  Voicemails left after 4:30 p.m. will not be returned until the following business day.  For prescription refill requests, have your pharmacy contact our office with your prescription refill request.

## 2012-12-12 ENCOUNTER — Other Ambulatory Visit (HOSPITAL_COMMUNITY): Payer: Self-pay | Admitting: Oncology

## 2012-12-26 ENCOUNTER — Other Ambulatory Visit (HOSPITAL_COMMUNITY): Payer: Self-pay | Admitting: Oncology

## 2012-12-26 ENCOUNTER — Telehealth (HOSPITAL_COMMUNITY): Payer: Self-pay | Admitting: *Deleted

## 2012-12-26 DIAGNOSIS — R21 Rash and other nonspecific skin eruption: Secondary | ICD-10-CM

## 2012-12-26 DIAGNOSIS — A4902 Methicillin resistant Staphylococcus aureus infection, unspecified site: Secondary | ICD-10-CM

## 2012-12-26 MED ORDER — CLOTRIMAZOLE-BETAMETHASONE 1-0.05 % EX CREA: 1.0000 "application " | TOPICAL_CREAM | Freq: Two times a day (BID) | CUTANEOUS | Status: DC

## 2012-12-26 MED ORDER — CLINDAMYCIN PHOSPHATE 1 % EX GEL: Freq: Two times a day (BID) | CUTANEOUS | Status: DC

## 2013-01-07 ENCOUNTER — Telehealth (HOSPITAL_COMMUNITY): Payer: Self-pay | Admitting: Oncology

## 2013-01-07 ENCOUNTER — Other Ambulatory Visit (HOSPITAL_COMMUNITY): Payer: Self-pay | Admitting: Oncology

## 2013-01-07 DIAGNOSIS — A4902 Methicillin resistant Staphylococcus aureus infection, unspecified site: Secondary | ICD-10-CM

## 2013-01-07 MED ORDER — DOXYCYCLINE HYCLATE 100 MG PO TABS: 100.0000 mg | ORAL_TABLET | Freq: Two times a day (BID) | ORAL | Status: DC

## 2013-01-07 NOTE — Telephone Encounter (Signed)
Message copied by Dennie Maizes on Mon Jan 07, 2013  3:13 PM ------      Message from: Ellouise Newer      Created: Mon Jan 07, 2013  3:06 PM       I will call in Doxycycline.  If this does not help, she will need to follow-up with her Alean Kromer PCP ------

## 2013-01-08 NOTE — Telephone Encounter (Signed)
Spoke with pt. Informed that medication Doxycycline had been prescribed yesterday. She said the pharmacy will deliver it to her today. Verbalized understanding.

## 2013-02-08 ENCOUNTER — Telehealth (HOSPITAL_COMMUNITY): Payer: Self-pay | Admitting: Oncology

## 2013-02-08 DIAGNOSIS — J069 Acute upper respiratory infection, unspecified: Secondary | ICD-10-CM

## 2013-02-08 MED ORDER — AZITHROMYCIN 250 MG PO TABS: ORAL_TABLET | ORAL | Status: DC
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
## 2013-02-11 ENCOUNTER — Other Ambulatory Visit (HOSPITAL_COMMUNITY): Payer: Self-pay | Admitting: Hematology and Oncology

## 2013-02-11 ENCOUNTER — Telehealth (HOSPITAL_COMMUNITY): Payer: Self-pay | Admitting: *Deleted

## 2013-02-11 DIAGNOSIS — F411 Generalized anxiety disorder: Secondary | ICD-10-CM

## 2013-02-11 MED ORDER — ALPRAZOLAM 0.5 MG PO TABS: 0.5000 mg | ORAL_TABLET | Freq: Four times a day (QID) | ORAL | Status: DC | PRN
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
## 2013-03-18 ENCOUNTER — Telehealth (HOSPITAL_COMMUNITY): Payer: Self-pay | Admitting: *Deleted

## 2013-03-18 ENCOUNTER — Telehealth (HOSPITAL_COMMUNITY): Payer: Self-pay

## 2013-03-18 ENCOUNTER — Other Ambulatory Visit (HOSPITAL_COMMUNITY): Payer: Self-pay | Admitting: Oncology

## 2013-03-18 DIAGNOSIS — F411 Generalized anxiety disorder: Secondary | ICD-10-CM

## 2013-03-18 MED ORDER — ALPRAZOLAM 0.5 MG PO TABS: 0.5000 mg | ORAL_TABLET | Freq: Four times a day (QID) | ORAL | Status: DC | PRN

## 2013-03-18 NOTE — Telephone Encounter (Signed)
Message copied by Mellissa Kohut on Mon Mar 18, 2013  9:42 AM ------      Message from: [facility]      Created: Mon Mar 18, 2013  9:23 AM       Xanax called in ------
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
## 2013-03-18 NOTE — Telephone Encounter (Signed)
Patient notified

## 2013-04-04 ENCOUNTER — Telehealth (HOSPITAL_COMMUNITY): Payer: Self-pay | Admitting: *Deleted

## 2013-04-26 ENCOUNTER — Other Ambulatory Visit: Payer: Self-pay | Admitting: Anesthesiology

## 2013-04-26 DIAGNOSIS — M545 Low back pain, unspecified: Secondary | ICD-10-CM

## 2013-05-04 ENCOUNTER — Other Ambulatory Visit: Payer: Medicaid Other

## 2013-05-09 ENCOUNTER — Ambulatory Visit
Admission: RE | Admit: 2013-05-09 | Discharge: 2013-05-09 | Disposition: A | Discharge status: Home / Self Care | Payer: Medicaid Other | Source: Ambulatory Visit | Attending: Anesthesiology | Admitting: Anesthesiology

## 2013-05-09 DIAGNOSIS — M545 Low back pain, unspecified: Secondary | ICD-10-CM

## 2013-06-03 ENCOUNTER — Other Ambulatory Visit (HOSPITAL_COMMUNITY): Payer: Self-pay | Admitting: Oncology

## 2013-06-07 ENCOUNTER — Ambulatory Visit (HOSPITAL_COMMUNITY): Payer: Medicaid Other | Admitting: Oncology

## 2013-06-12 ENCOUNTER — Ambulatory Visit (HOSPITAL_COMMUNITY): Payer: Medicaid Other | Admitting: Oncology

## 2013-06-17 ENCOUNTER — Other Ambulatory Visit (HOSPITAL_COMMUNITY): Payer: Self-pay | Admitting: Oncology

## 2013-08-19 ENCOUNTER — Other Ambulatory Visit (HOSPITAL_COMMUNITY): Payer: Self-pay | Admitting: Oncology

## 2013-09-18 ENCOUNTER — Ambulatory Visit (INDEPENDENT_AMBULATORY_CARE_PROVIDER_SITE_OTHER): Payer: Medicaid Other | Admitting: Podiatrist

## 2013-09-18 ENCOUNTER — Encounter: Payer: Self-pay | Admitting: Podiatrist

## 2013-09-18 VITALS — BP 157/90 | HR 74 | Resp 12

## 2013-09-18 DIAGNOSIS — B353 Tinea pedis: Secondary | ICD-10-CM

## 2013-09-18 MED ORDER — NAFTIFINE HCL 2 % EX GEL: 1.0000 "application " | Freq: Every day | CUTANEOUS | Status: DC

## 2013-09-18 MED ORDER — CASTELLANI PAINT MODIFIED 1.5 % EX LIQD: 1.0000 "application " | Freq: Every day | CUTANEOUS | Status: DC

## 2013-09-18 NOTE — Patient Instructions (Signed)

## 2013-09-18 NOTE — Progress Notes (Signed)
   Subjective:    Patient ID: [name], female    DOB: 10-27-1960, 53 y.o.   MRN: [id number]  HPI PT STATED B/L BETWEEN THE TOES HAVE ODOR AND IT ITCH. THE TOES BEEN THE SAME NOT GETTING WORSE. TRIED TO USED RX.TRIAMCINOLONE BID AND IT HELPS.   Review of Systems  Eyes: Positive for itching.  Respiratory: Positive for chest tightness.   Gastrointestinal: Positive for nausea.  Musculoskeletal: Positive for joint swelling.  Psychiatric/Behavioral: The patient is nervous/anxious.   All other systems reviewed and are negative.      Objective:   Physical Exam  Patient is awake, alert, and oriented x 3.  In no acute distress.  Vascular status is intact with palpable pedal pulses at 2/4 DP and PT bilateral and capillary refill time within normal limits. Neurological sensation is also intact bilaterally via Semmes Weinstein monofilament at 5/5 sites. Light touch, vibratory sensation, Achilles tendon reflex is intact. Dermatological exam reveals wetness and interdigital maceration between the toes 3,4,5 on both feet.  No open lesions seen.  Dorsal surface of toes is also a scaley type of appearance consistent with tinea pedis.  Musculature intact with dorsiflexion, plantarflexion, inversion, eversion.    Assessment & Plan:  Interdigital tinea pedis,   Plan:  rx for naftin gel 2% as well as castellani's paint was called into her pharmacy.  She was given instructions for use.  She will use this combination for 2 weeks and then call if there is no improvement.  May consider lamisil short course if necessary.

## 2013-10-04 ENCOUNTER — Telehealth: Payer: Self-pay | Admitting: *Deleted

## 2013-10-04 MED ORDER — OXICONAZOLE NITRATE 1 % EX LOTN: TOPICAL_LOTION | Freq: Two times a day (BID) | CUTANEOUS | Status: DC

## 2013-10-04 NOTE — Telephone Encounter (Signed)
The medicine for my toes isn't helping.  Medicaid wouldn't pay for the other medicine, it cost $30.  Can she call me something else in?  Would you give me a call?  Have a good day.

## 2013-10-04 NOTE — Telephone Encounter (Signed)
I'm on some medication that is not working.  The other prescription costs $30 and I can't afford that.

## 2013-10-04 NOTE — Telephone Encounter (Signed)
I tried to call in another topical antifungal but it looks as if she has tried all the ones that are covered by her medicaid so all the other options will be out of pocket.  The only other option I have is to put her on a short course of Lamisil.  #21.  1 tab po daily.

## 2013-10-08 MED ORDER — TERBINAFINE HCL 250 MG PO TABS: 250.0000 mg | ORAL_TABLET | Freq: Every day | ORAL | Status: DC

## 2013-10-08 NOTE — Telephone Encounter (Signed)
I called and gave her Dr. Shaune Pollack response that you have tried all the medications that are covered by Medicaid.  She said your other options, you will have to pay out of pocket.  I told her Dr. Valentina Lucks said she can put you on a short round of Lamisil which is an oral medication.  She stated okay that will be fine, I'm on a fixed income and I couldn't afford that other medicine.  Can you call it into [place]?  I told her I would.  I e-scribed the prescription to the pharmacy.

## 2013-11-18 ENCOUNTER — Other Ambulatory Visit (HOSPITAL_COMMUNITY): Payer: Self-pay | Admitting: Oncology

## 2013-12-20 ENCOUNTER — Ambulatory Visit: Payer: Medicaid Other | Admitting: Podiatrist

## 2014-01-03 ENCOUNTER — Ambulatory Visit: Payer: Medicaid Other | Admitting: Podiatrist

## 2014-01-31 ENCOUNTER — Ambulatory Visit (INDEPENDENT_AMBULATORY_CARE_PROVIDER_SITE_OTHER): Payer: Medicaid Other | Admitting: Podiatrist

## 2014-01-31 ENCOUNTER — Other Ambulatory Visit (HOSPITAL_COMMUNITY): Payer: Self-pay | Admitting: Oncology

## 2014-01-31 ENCOUNTER — Encounter: Payer: Self-pay | Admitting: Podiatrist

## 2014-01-31 DIAGNOSIS — B351 Tinea unguium: Secondary | ICD-10-CM

## 2014-01-31 DIAGNOSIS — B353 Tinea pedis: Secondary | ICD-10-CM

## 2014-01-31 MED ORDER — NAFTIFINE HCL 2 % EX GEL: 1.0000 "application " | Freq: Every day | CUTANEOUS | Status: DC

## 2014-01-31 NOTE — Patient Instructions (Signed)
Apply the castellani's paint with q tips every day- twice a day-- at night apply the naftin gel and put cotton balls between your toes to dry them out good.  Keep doing this every day.

## 2014-01-31 NOTE — Progress Notes (Signed)
   Subjective:    Patient ID: [name], female    DOB: November 28, 1960, 53 y.o.   MRN: [id number]  HPI Patient present today for follow-up of odor and macerated tissue between the toes. She states that she tried the Castellani's pain however they were unable to get the Naftin gel for her through her insurance. She relates no improvement with just a Castellani's paint.    Objective:   Physical Exam  Patient is awake, alert, and oriented x 3.  In no acute distress.  Vascular status is intact with palpable pedal pulses at 2/4 DP and PT bilateral and capillary refill time within normal limits. Neurological sensation is also intact bilaterally via Semmes Weinstein monofilament at 5/5 sites. Light touch, vibratory sensation, Achilles tendon reflex is intact. Dermatological exam reveals wetness and interdigital maceration between the toes 3,4,5 on both feet.  No open lesions seen.  Dorsal surface of toes is also a scaley type of appearance consistent with tinea pedis.  Musculature intact with dorsiflexion, plantarflexion, inversion, eversion.    Assessment & Plan:  Interdigital tinea pedis,   Plan: Recommended different topical antifungal as well as Formadon. Also recommended a short course of Lamisil therapy as long as she has no liver or kidney dysfunction of which she related she does not. I'll check her back in 3 weeks for follow-up and if any problems or concerns arise in the meantime she is instructed to call immediately.

## 2014-02-07 ENCOUNTER — Other Ambulatory Visit: Payer: Self-pay | Admitting: *Deleted

## 2014-02-07 NOTE — Telephone Encounter (Signed)
Naftin 2% gel not covered by insurance.  Dr. Valentina Lucks changed the prescription to Clotrimazole Cream, 1 tube, apply twice daily, 3 additional refills.

## 2014-02-28 ENCOUNTER — Encounter (HOSPITAL_COMMUNITY): Payer: Self-pay | Admitting: Oncology

## 2014-02-28 ENCOUNTER — Encounter (HOSPITAL_COMMUNITY): Payer: Medicaid Other | Attending: Oncology | Admitting: Oncology

## 2014-02-28 VITALS — BP 162/103 | HR 80 | Temp 98.4°F | Resp 18 | Wt 218.8 lb

## 2014-02-28 DIAGNOSIS — H109 Unspecified conjunctivitis: Secondary | ICD-10-CM | POA: Insufficient documentation

## 2014-02-28 DIAGNOSIS — Z853 Personal history of malignant neoplasm of breast: Secondary | ICD-10-CM | POA: Diagnosis not present

## 2014-02-28 DIAGNOSIS — H5789 Other specified disorders of eye and adnexa: Secondary | ICD-10-CM | POA: Insufficient documentation

## 2014-02-28 DIAGNOSIS — H578 Other specified disorders of eye and adnexa: Secondary | ICD-10-CM

## 2014-02-28 DIAGNOSIS — M549 Dorsalgia, unspecified: Secondary | ICD-10-CM | POA: Insufficient documentation

## 2014-02-28 DIAGNOSIS — F411 Generalized anxiety disorder: Secondary | ICD-10-CM

## 2014-02-28 DIAGNOSIS — M545 Low back pain: Secondary | ICD-10-CM

## 2014-02-28 DIAGNOSIS — C50912 Malignant neoplasm of unspecified site of left female breast: Secondary | ICD-10-CM

## 2014-02-28 DIAGNOSIS — F419 Anxiety disorder, unspecified: Secondary | ICD-10-CM

## 2014-02-28 MED ORDER — DICLOFENAC SODIUM 1 % TD GEL: 2.0000 g | Freq: Two times a day (BID) | TRANSDERMAL | Status: DC

## 2014-02-28 MED ORDER — CIPROFLOXACIN HCL 0.3 % OP SOLN: 2.0000 [drp] | OPHTHALMIC | Status: DC

## 2014-02-28 MED ORDER — EPINASTINE HCL 0.05 % OP SOLN: 2.0000 [drp] | Freq: Two times a day (BID) | OPHTHALMIC | Status: DC

## 2014-02-28 NOTE — Patient Instructions (Signed)
[place] Discharge Instructions  RECOMMENDATIONS MADE BY THE CONSULTANT AND ANY TEST RESULTS WILL BE SENT TO YOUR REFERRING PHYSICIAN.  Following prescriptions provided today: 1. Ciloxan eye drops 2. Epinastine eye drops 3. Voltaren gel I called in refills for Xanax to [facility]. Return in 1 year for follow-up.  Thank you for choosing [place] [facility] to  provide your oncology and hematology care. To afford each patient quality  time with our provider, please arrive at least 15 minutes before your  scheduled appointment time.  You need to re-schedule your appointment should you arrive 10 or more  minutes late. We strive to give you quality time with our providers, and  arriving late affects you and other patients whose appointments are after  yours. Also, if you no show three or more times for appointments you may  be dismissed from the clinic at the providers discretion.  Again, thank you for choosing [facility] Our hope is that  these requests will decrease the amount of time that you wait before being seen by our physicians. _______________________________________________________________  Should you have questions after your visit to [facility], please contact our office at [phone] between the hours of 8:30 a.m. and 5:00 p.m.  Voicemails left after 4:30 p.m. will not be returned until the following business day.  For prescription refill requests, have your pharmacy contact our office with your prescription refill request.

## 2014-02-28 NOTE — Progress Notes (Signed)
Natalie Drafts., FNP [address] [name]. Dr. [name] [id number]  Breast cancer, left  Low back pain without sciatica, unspecified back pain laterality - Plan: diclofenac sodium (VOLTAREN) 1 % GEL  Eye discharge - Plan: ciprofloxacin (CILOXAN) 0.3 % ophthalmic solution, Epinastine HCl 0.05 % ophthalmic solution  Bilateral conjunctivitis - Plan: ciprofloxacin (CILOXAN) 0.3 % ophthalmic solution, Epinastine HCl 0.05 % ophthalmic solution  Eye drainage  Anxiety state  CURRENT THERAPY: Surveillance per NCCN guidelines.  INTERVAL HISTORY: [name] 54 y.o. 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 Primary was 5.9 cm clinically and radiographically with 1 hypermetabolic lymph node.  At the time of surgery, cancer was still 2.5 cm in size, but 10 nodes were negative.  LVI was not identified.  ER negative, PR negative, Her2 negative.  Initial biopsy was on 06/18/2007 and her mastectomy was on 8/21/200  All therapy was completed on 04/18/2008.     H/O Stage III Breast cancer, left   06/18/2007 Initial Diagnosis H/O Stage III Breast cancer, ER-, PR-, Her2-.  Primary was 5.9 cm in the left breast.  Grade 3 tumor.  1 lymph node hypermetabolic on PET scan in 1324 suspicous for metastatic disease.   08/06/2007 - 09/18/2007 Chemotherapy Dose dense AC, pre-operatively   10/12/2007 Surgery B/L mastectomies by Dr. Arnoldo Morale   12/26/2007 - 04/18/2008 Chemotherapy Taxol weekly x 11   04/19/2008 Remission    I personally reviewed and went over laboratory results with the patient.  The results are noted within this dictation.  I personally reviewed and went over radiographic studies with the patient.  The results are noted within this dictation.    Chart reviewed.  She is doing well.  She reports that she has a difficult time getting to her primary care provider for refills on Voltaren gel.   Therefore, this is provided to her today.   She notes bilateral eye drainage.  She his a history of MRSA skin infections.  Her conjunctiva or injected and palpebra are erythematous.  She notes that the drainage is clear.  She denies any burning, but note pruritis.  She denies any sick contacts.   She requests refills on her Xanax.   Oncologically, she denies any complaints and ROS questioning is negative  Past Medical History  Diagnosis Date  . COPD (chronic obstructive pulmonary disease) 09/14/2010  . 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 H/O MRSA infection 09/14/2010  . Back muscle spasm 12/13/2010  . Psoriasis 12/13/2010  . Cancer   . Asthma   . Hypertension   . High blood cholesterol   . Hyperlipidemia 12/07/2012  . Tobacco abuse 12/07/2012    has Anxiety state; DEPRESSION; HYPERTENSION; H/O Stage III Breast cancer, left; COPD (chronic obstructive pulmonary disease); Marijuana abuse in the past; H/O MRSA infection; Psoriasis; Hyperlipidemia; Tobacco abuse; Eye drainage; Conjunctivitis; and Back pain on her problem list.     is allergic to codeine and penicillins.  [name] does not currently have medications on file.  Past Surgical History  Procedure Laterality Date  . Portacath insertion    . Mastectomy      left  . Abdominal hysterectomy    . Cesarean section      x7  . Port-a-cath removal  06/01/2011    Procedure: REMOVAL PORT-A-CATH;  Surgeon: Donato Heinz, MD;  Location: [facility];  Service: General;  Laterality: N/A;  Minor Room  . Mastectomy      Denies any headaches, dizziness, double vision, fevers, chills, night sweats, nausea, vomiting, diarrhea, constipation, chest pain, heart palpitations, shortness of breath, blood in stool, black tarry stool, urinary pain, urinary burning, urinary frequency, hematuria.   PHYSICAL EXAMINATION  ECOG PERFORMANCE STATUS: 0 - Asymptomatic  Filed Vitals:   02/28/14 0800  BP: 162/103  Pulse: 80  Temp: 98.4 F  (36.9 C)  Resp: 18    GENERAL:alert, no distress, well nourished, well developed, comfortable, cooperative, obese and smiling SKIN: skin color, texture, turgor are normal HEAD: Normocephalic, No masses, lesions, tenderness or abnormalities EYES: bilateral injected sclera with erythema of palpebra bilaterally.  EARS: External ears normal OROPHARYNX:mucous membranes are moist  NECK: supple, no adenopathy, thyroid normal size, non-tender, without nodularity, no stridor, trachea midline LYMPH:  no palpable lymphadenopathy BREAST:patient declines to have breast exam LUNGS: clear to auscultation and percussion HEART: regular rate & rhythm ABDOMEN:abdomen soft, obese and normal bowel sounds BACK: Back symmetric, no curvature. EXTREMITIES:less then 2 second capillary refill, no joint deformities, effusion, or inflammation, no cyanosis  NEURO: alert & oriented x 3 with fluent speech, no focal motor/sensory deficits, gait normal   LABORATORY DATA: CBC    Component Value Date/Time   WBC 7.4 10/05/2012 1020   RBC 4.46 10/05/2012 1020   HGB 13.9 10/05/2012 1020   HCT 42.1 10/05/2012 1020   PLT 226 10/05/2012 1020   MCV 94.4 10/05/2012 1020   MCH 31.2 10/05/2012 1020   MCHC 33.0 10/05/2012 1020   RDW 14.8 10/05/2012 1020   LYMPHSABS 2.3 10/05/2012 1020   MONOABS 0.3 10/05/2012 1020   EOSABS 0.1 10/05/2012 1020   BASOSABS 0.0 10/05/2012 1020      Chemistry      Component Value Date/Time   NA 138 10/05/2012 1020   K 4.0 10/05/2012 1020   CL 100 10/05/2012 1020   CO2 30 10/05/2012 1020   BUN 12 10/05/2012 1020   CREATININE 0.64 10/05/2012 1020      Component Value Date/Time   CALCIUM 9.8 10/05/2012 1020   ALKPHOS 89 04/13/2011 1124   AST 27 04/13/2011 1124   ALT 27 04/13/2011 1124   BILITOT 0.3 04/13/2011 1124        ASSESSMENT AND PLAN:  H/O Stage III Breast cancer, left 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 Primary was  5.9 cm clinically and radiographically with 1 hypermetabolic lymph node.  At the time of surgery, cancer was still 2.5 cm in size, but 10 nodes were negative.  LVI was not identified.  ER negative, PR negative, Her2 negative.  Initial biopsy was on 06/18/2007 and her mastectomy was on 8/21/200  All therapy was completed on 04/18/2008. Noncompliant with appointment and surveillance recommendations.  Follow-up in one year.  Back pain Voltaren gel provided.  Future refills from primary care provider.  Eye drainage Bilateral clear drainage.  Rx for generic Elestat provided.  Conjunctivitis Rx for Ciloxan.  Follow-up with primary care provider if not improved.   Anxiety state Refills on Xanax provided.     THERAPY PLAN:  NCCN guidelines recommends the following surveillance for invasive breast cancer:  A. History and Physical exam every 4-6 months for 5 years and then every 12 months.  B. Mammography every 12 months  C. Women on Tamoxifen: annual gynecologic assessment every 12 months if uterus is present.  D. Women on aromatase inhibitor or who experience ovarian failure secondary  to treatment should have monitoring of bone health with a bone mineral density determination at baseline and periodically thereafter.  E. Assess and encourage adherence to adjuvant endocrine therapy.  F. Evidence suggests that active lifestyle and achieving and maintaining an ideal body weight (20-25 BMI) may lead to optimal breast cancer outcomes.   All questions were answered. The patient knows to call the clinic with any problems, questions or concerns. We can certainly see the patient much sooner if necessary.  Patient and plan discussed with Dr. Ancil Linsey and she is in agreement with the aforementioned.   KEFALAS,THOMAS 02/28/2014

## 2014-02-28 NOTE — Assessment & Plan Note (Signed)
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 Primary was 5.9 cm clinically and radiographically with 1 hypermetabolic lymph node.  At the time of surgery, cancer was still 2.5 cm in size, but 10 nodes were negative.  LVI was not identified.  ER negative, PR negative, Her2 negative.  Initial biopsy was on 06/18/2007 and her mastectomy was on 8/21/200  All therapy was completed on 04/18/2008. Noncompliant with appointment and surveillance recommendations.  Follow-up in one year.

## 2014-02-28 NOTE — Assessment & Plan Note (Signed)
Bilateral clear drainage.  Rx for generic Elestat provided.

## 2014-02-28 NOTE — Assessment & Plan Note (Signed)
Refills on Xanax provided.

## 2014-02-28 NOTE — Assessment & Plan Note (Signed)
Voltaren gel provided.  Future refills from primary care provider.

## 2014-02-28 NOTE — Assessment & Plan Note (Signed)
Rx for Ciloxan.  Follow-up with primary care provider if not improved.

## 2014-08-26 ENCOUNTER — Other Ambulatory Visit (HOSPITAL_COMMUNITY): Payer: Self-pay | Admitting: Oncology

## 2014-08-27 ENCOUNTER — Other Ambulatory Visit (HOSPITAL_COMMUNITY): Payer: Self-pay | Admitting: Oncology

## 2014-09-24 ENCOUNTER — Encounter (HOSPITAL_COMMUNITY): Payer: Self-pay | Admitting: *Deleted

## 2014-09-24 ENCOUNTER — Emergency Department (HOSPITAL_COMMUNITY): Payer: Medicaid Other

## 2014-09-24 ENCOUNTER — Emergency Department (HOSPITAL_COMMUNITY)
Admission: EM | Admit: 2014-09-24 | Discharge: 2014-09-24 | Disposition: A | Discharge status: Home / Self Care | Payer: Medicaid Other | Attending: Emergency Medicine | Admitting: Emergency Medicine

## 2014-09-24 DIAGNOSIS — M79672 Pain in left foot: Secondary | ICD-10-CM | POA: Insufficient documentation

## 2014-09-24 DIAGNOSIS — G8929 Other chronic pain: Secondary | ICD-10-CM | POA: Insufficient documentation

## 2014-09-24 DIAGNOSIS — J449 Chronic obstructive pulmonary disease, unspecified: Secondary | ICD-10-CM | POA: Insufficient documentation

## 2014-09-24 DIAGNOSIS — Z8614 Personal history of Methicillin resistant Staphylococcus aureus infection: Secondary | ICD-10-CM | POA: Diagnosis not present

## 2014-09-24 DIAGNOSIS — I10 Essential (primary) hypertension: Secondary | ICD-10-CM | POA: Insufficient documentation

## 2014-09-24 DIAGNOSIS — M545 Low back pain: Secondary | ICD-10-CM | POA: Insufficient documentation

## 2014-09-24 DIAGNOSIS — Z7982 Long term (current) use of aspirin: Secondary | ICD-10-CM | POA: Diagnosis not present

## 2014-09-24 DIAGNOSIS — Z872 Personal history of diseases of the skin and subcutaneous tissue: Secondary | ICD-10-CM | POA: Insufficient documentation

## 2014-09-24 DIAGNOSIS — Z7951 Long term (current) use of inhaled steroids: Secondary | ICD-10-CM | POA: Diagnosis not present

## 2014-09-24 DIAGNOSIS — Z79899 Other long term (current) drug therapy: Secondary | ICD-10-CM | POA: Insufficient documentation

## 2014-09-24 DIAGNOSIS — E785 Hyperlipidemia, unspecified: Secondary | ICD-10-CM | POA: Diagnosis not present

## 2014-09-24 DIAGNOSIS — Z720 Tobacco use: Secondary | ICD-10-CM | POA: Diagnosis not present

## 2014-09-24 DIAGNOSIS — M79671 Pain in right foot: Secondary | ICD-10-CM | POA: Insufficient documentation

## 2014-09-24 DIAGNOSIS — Z791 Long term (current) use of non-steroidal anti-inflammatories (NSAID): Secondary | ICD-10-CM | POA: Insufficient documentation

## 2014-09-24 DIAGNOSIS — E780 Pure hypercholesterolemia: Secondary | ICD-10-CM | POA: Insufficient documentation

## 2014-09-24 DIAGNOSIS — Z880 Allergy status to penicillin: Secondary | ICD-10-CM | POA: Diagnosis not present

## 2014-09-24 DIAGNOSIS — Z859 Personal history of malignant neoplasm, unspecified: Secondary | ICD-10-CM | POA: Diagnosis not present

## 2014-09-24 DIAGNOSIS — M79673 Pain in unspecified foot: Secondary | ICD-10-CM

## 2014-09-24 DIAGNOSIS — M549 Dorsalgia, unspecified: Secondary | ICD-10-CM

## 2014-09-24 LAB — BASIC METABOLIC PANEL
Anion gap: 7 (ref 5–15)
BUN: 10 mg/dL (ref 6–20)
CALCIUM: 9.8 mg/dL (ref 8.9–10.3)
CHLORIDE: 104 mmol/L (ref 101–111)
CO2: 30 mmol/L (ref 22–32)
CREATININE: 0.87 mg/dL (ref 0.44–1.00)
GFR calc non Af Amer: >60 mL/min (ref >60)
GLUCOSE: 120 mg/dL — AB (ref 65–99)
Potassium: 4.3 mmol/L (ref 3.5–5.1)
SODIUM: 141 mmol/L (ref 135–145)

## 2014-09-24 LAB — CBC
HEMATOCRIT: 44.6 % (ref 36.0–46.0)
Hemoglobin: 15 g/dL (ref 12.0–15.0)
MCH: 31.6 pg (ref 26.0–34.0)
MCHC: 33.6 g/dL (ref 30.0–36.0)
MCV: 93.9 fL (ref 78.0–100.0)
Platelets: 242 10*3/uL (ref 150–400)
RBC: 4.75 MIL/uL (ref 3.87–5.11)
RDW: 14.7 % (ref 11.5–15.5)
WBC: 8.9 10*3/uL (ref 4.0–10.5)

## 2014-09-24 LAB — TROPONIN I: Troponin I: <0.03 ng/mL (ref <0.031)

## 2014-09-24 MED ORDER — KETOROLAC TROMETHAMINE 30 MG/ML IJ SOLN
30.0000 mg | Freq: Once | INTRAMUSCULAR | Status: AC
  Administered 2014-09-24: 30 mg via INTRAMUSCULAR
  Filled 2014-09-24: qty 1

## 2014-09-24 NOTE — ED Provider Notes (Signed)
CSN: [id number]     Arrival date & time 09/24/14  1712 History   First MD Initiated Contact with Patient 09/24/14 1848     Chief Complaint  Patient presents with  . Back Pain  . Foot Pain     (Consider location/radiation/quality/duration/timing/severity/associated sxs/prior Treatment) Patient is a 54 y.o. female presenting with back pain. The history is provided by the patient.  Back Pain Location:  Lumbar spine Quality:  Aching Radiates to:  Does not radiate Pain severity:  Moderate Onset quality:  Gradual Duration: Multiple years. Timing:  Constant Progression:  Unchanged Chronicity:  Chronic Relieved by:  Nothing Worsened by:  Nothing tried Ineffective treatments:  None tried Associated symptoms: leg pain (Chronic bilateral foot pain and wounds for which she is seeing a podiatrist)   Associated symptoms: no abdominal pain, no fever, no numbness and no weakness   Risk factors: obesity     Past Medical History  Diagnosis Date  . COPD (chronic obstructive pulmonary disease) 09/14/2010  . 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 H/O MRSA infection 09/14/2010  . Back muscle spasm 12/13/2010  . Psoriasis 12/13/2010  . Cancer   . Asthma   . Hypertension   . High blood cholesterol   . Hyperlipidemia 12/07/2012  . Tobacco abuse 12/07/2012   Past Surgical History  Procedure Laterality Date  . Portacath insertion    . Mastectomy      left  . Abdominal hysterectomy    . Cesarean section      x7  . Port-a-cath removal  06/01/2011    Procedure: REMOVAL PORT-A-CATH;  Surgeon: Donato Heinz, MD;  Location: [facility];  Service: General;  Laterality: N/A;  Minor Room  . Mastectomy     Family History  Problem Relation Age of Onset  . Stroke Mother   . Stroke Father   . Cancer Maternal Aunt   . Cancer Maternal Grandmother   . Cancer Maternal Aunt   . Cancer Maternal Aunt    History  Substance Use Topics  . Smoking status: Current Some Day Smoker -- 0.25 packs/day for 36  years  . Smokeless tobacco: Never Used  . Alcohol Use: No   OB History    No data available     Review of Systems  Constitutional: Negative for fever.  Gastrointestinal: Negative for abdominal pain.  Musculoskeletal: Positive for back pain.  Neurological: Negative for weakness and numbness.  All other systems reviewed and are negative.     Allergies  Codeine and Penicillins  Home Medications   Prior to Admission medications   Medication Sig Start Date End Date Taking? Authorizing Provider  ALPRAZolam Duanne Moron) 0.5 MG tablet TAKE ONE TABLET BY MOUTH FOUR TIMES DAILY AS NEEDED. 08/27/14   Baird Cancer, PA-C  aspirin 81 MG tablet Take 81 mg by mouth daily.    Historical Provider, MD  bisacodyl (DULCOLAX) 5 MG EC tablet Take 5 mg by mouth daily as needed for constipation.    Historical Provider, MD  Candee Furbish Paint Modified 1.5 % LIQD Apply 1 application topically at bedtime. Patient not taking: Reported on 02/28/2014 09/18/13   Bronson Ing, DPM  clotrimazole-betamethasone (LOTRISONE) cream Apply 1 application topically 2 (two) times daily. Apply to rash BID 12/26/12   Manon Hilding Kefalas, PA-C  cyclobenzaprine (FLEXERIL) 5 MG tablet Take 5 mg by mouth 3 (three) times daily as needed for muscle spasms.    Historical Provider, MD  diclofenac sodium (VOLTAREN) 1 % GEL Apply 2  g topically 2 (two) times daily. 02/28/14   Baird Cancer, PA-C  Epinastine HCl 0.05 % ophthalmic solution Place 2 drops into both eyes 2 (two) times daily. 02/28/14   Baird Cancer, PA-C  hydrochlorothiazide (HYDRODIURIL) 50 MG tablet Take 50 mg by mouth daily.    Historical Provider, MD  ketoconazole (NIZORAL) 2 % cream Apply 1 application topically daily.    Historical Provider, MD  lisinopril (PRINIVIL,ZESTRIL) 10 MG tablet Take 40 mg by mouth daily.     Historical Provider, MD  metoprolol succinate (TOPROL-XL) 50 MG 24 hr tablet Take 50 mg by mouth daily. Take with or immediately following a meal.     Historical Provider, MD  Naftifine HCl 2 % GEL Apply 1 application topically daily. Patient not taking: Reported on 02/28/2014 01/31/14   Bronson Ing, DPM  naproxen (NAPROSYN) 500 MG tablet Take 500 mg by mouth 2 (two) times daily with a meal.    Historical Provider, MD  oxiconazole (OXISTAT) 1 % lotion Apply topically 2 (two) times daily. Patient not taking: Reported on 02/28/2014 10/04/13   Bronson Ing, DPM  pravastatin (PRAVACHOL) 20 MG tablet Take 20 mg by mouth daily.    Historical Provider, MD  prochlorperazine (COMPAZINE) 10 MG tablet TAKE ONE TABLET BY MOUTH EVERY 6 HOURS AS NEEDED FOR NAUSEA.    Manon Hilding Kefalas, PA-C  terbinafine (LAMISIL) 250 MG tablet Take 1 tablet (250 mg total) by mouth daily. 10/08/13   Viona Gilmore Egerton, DPM   BP 157/89 mmHg  Pulse 76  Temp(Src) 98.4 F (36.9 C)  Resp 14  SpO2 96% Physical Exam  Constitutional: She is oriented to person, place, and time. She appears well-developed and well-nourished. No distress.  HENT:  Head: Normocephalic.  Eyes: Conjunctivae are normal.  Neck: Neck supple. No tracheal deviation present.  Cardiovascular: Normal rate and regular rhythm.   Pulmonary/Chest: Effort normal. No respiratory distress.  Abdominal: Soft. She exhibits no distension. There is no tenderness. There is no rebound and no guarding.  Musculoskeletal:       Lumbar back: She exhibits pain. She exhibits no tenderness and no bony tenderness.  Neurological: She is alert and oriented to person, place, and time. She has normal strength. Coordination normal. GCS eye subscore is 4. GCS verbal subscore is 5. GCS motor subscore is 6.  Skin: Skin is warm and dry.  Psychiatric: She has a normal mood and affect.    ED Course  Procedures (including critical care time) Labs Review Labs Reviewed  BASIC METABOLIC PANEL - Abnormal; Notable for the following:    Glucose, Bld 120 (*)    All other components within normal limits  CBC  TROPONIN I    Imaging  Review Dg Chest 2 View  09/24/2014   CLINICAL DATA:  Chest pain 3 days.  EXAM: CHEST  2 VIEW  COMPARISON:  12/09/2008 and 05/23/2007  FINDINGS: Lungs are adequately inflated without focal consolidation or effusion. Cardiomediastinal silhouette and remainder the exam is unchanged.  IMPRESSION: No acute cardiopulmonary disease.   Electronically Signed   By: Marin Olp M.D.   On: 09/24/2014 17:56     EKG Interpretation   Date/Time:  Wednesday September 24 2014 17:15:37 EDT Ventricular Rate:  83 PR Interval:  156 QRS Duration: 94 QT Interval:  382 QTC Calculation: 448 R Axis:   79 Text Interpretation:  Normal sinus rhythm Normal ECG No significant change  since last tracing Confirmed by Mohmmad Saleeby MD, Andera Cranmer [phone]) on 09/24/2014  7:16:52 PM      MDM   Final diagnoses:  Chronic back pain  Chronic foot pain, unspecified laterality    54 year old female presents with chronic ongoing low back pain and bilateral foot pain that has been followed by her primary care physician for multiple years. She states that she is here today to find "a good doctor" that will give her narcotic pain medications to help her sleep through the night. She is secondary complaint that her chest gets tight when she gets bad foot and back pain but at this time it appears to be only related to that and not with exertion. Symptoms are very atypical for ACS. Screening x-ray, troponin, and EKG are unremarkable for cardiac etiologies.  Plan to follow up with PCP as needed and return precautions discussed for worsening or new concerning symptoms.   Leo Grosser, MD 09/24/14 [phone]

## 2014-09-24 NOTE — Discharge Instructions (Signed)

## 2014-09-24 NOTE — ED Notes (Signed)
The pt is c/o lt upper chest pain for 2-3 days with nausea.  She is also c/o abd and leg pain with holes in her lower extremities

## 2014-10-31 ENCOUNTER — Encounter (HOSPITAL_COMMUNITY): Payer: Medicaid Other | Attending: Oncology | Admitting: Oncology

## 2014-10-31 VITALS — BP 124/75 | HR 70 | Temp 98.7°F | Resp 18

## 2014-10-31 DIAGNOSIS — C50912 Malignant neoplasm of unspecified site of left female breast: Secondary | ICD-10-CM | POA: Diagnosis not present

## 2014-10-31 DIAGNOSIS — F411 Generalized anxiety disorder: Secondary | ICD-10-CM | POA: Diagnosis not present

## 2014-10-31 MED ORDER — ALPRAZOLAM 0.5 MG PO TABS: ORAL_TABLET | ORAL | Status: DC

## 2014-10-31 NOTE — Patient Instructions (Signed)
[place] [facility] [place] Day Discharge Instructions  RECOMMENDATIONS MADE BY THE CONSULTANT AND ANY TEST RESULTS WILL BE SENT TO YOUR REFERRING PHYSICIAN.  1.  Return as scheduled for your office visit. 2.  Ultrasound of your breast this month. 3.  Medication has been called in to your pharmacy.  Contact us sooner if any concerns.  Thank you for choosing [place] at [facility] to provide your oncology and hematology care.  To afford each patient quality time with our provider, please arrive at least 15 minutes before your scheduled appointment time.    You need to re-schedule your appointment should you arrive 10 or more minutes late.  We strive to give you quality time with our providers, and arriving late affects you and other patients whose appointments are after yours.  Also, if you no show three or more times for appointments you may be dismissed from the clinic at the providers discretion.     Again, thank you for choosing [facility].  Our hope is that these requests will decrease the amount of time that you wait before being seen by our physicians.       _____________________________________________________________  Should you have questions after your visit to [facility], please contact our office at [phone] between the hours of 8:30 a.m. and 4:30 p.m.  Voicemails left after 4:30 p.m. will not be returned until the following business day.  For prescription refill requests, have your pharmacy contact our office.

## 2014-10-31 NOTE — Addendum Note (Signed)
Addended by: Baird Cancer on: 10/31/2014 05:51 PM   Modules accepted: Orders, Medications

## 2014-10-31 NOTE — Progress Notes (Addendum)
[name] is seen as a work-in today at the request of her primary care provider who was concerned about a right axillary finding that was of concern.  She reports that she performs self-breast exams regularly.  She notes that she is unable to identify the abnormality on her own exam.  She denies any appearance changes on the right.  She denies any tenderness.  PE: BP 124/75 mmHg  Pulse 70  Temp(Src) 98.7 F (37.1 C) (Oral)  Resp 18 Gen: Accompanied by friend.  NAD. HEENT: Normocephalic Skin: Warm and dry Neck: supple, trachea midline Axilla:Right axilla without any obvious lymphadenopathy.   Breast: S/P bilateral mastectomies without any new lesions or masses.    Neuro: A and O x 3.  No focal deficits.  Assessment: 1. 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 Primary was 5.9 cm clinically and radiographically with 1 hypermetabolic lymph node. At the time of surgery, cancer was still 2.5 cm in size, but 10 nodes were negative. LVI was not identified. ER negative, PR negative, Her2 negative. Initial biopsy was on 06/18/2007 and her mastectomy was on 8/21/200 All therapy was completed on 04/18/2008.    Plan: 1. Right axillary [place].  With bilateral mastectomies resulting in changes in internal architecture of right breast and anterior axilla, ultrasound would be helpful in ruling out abnormality that may not be palpable. 2. Will be on the look-out for results 3. Return as scheduled for follow-up.  If finding is discovered, we will see her much sooner.    Patient and plan discussed with Dr. Ancil Linsey and she is in agreement with the aforementioned.   Natalie Paulding, PA-C 10/31/2014 4:09 PM

## 2014-11-04 ENCOUNTER — Inpatient Hospital Stay (HOSPITAL_COMMUNITY): Admission: RE | Admit: 2014-11-04 | Payer: Medicaid Other | Source: Ambulatory Visit

## 2014-11-11 ENCOUNTER — Ambulatory Visit (HOSPITAL_COMMUNITY): Payer: Medicaid Other | Attending: Oncology

## 2014-11-11 ENCOUNTER — Ambulatory Visit (HOSPITAL_COMMUNITY): Admission: RE | Admit: 2014-11-11 | Payer: Medicaid Other | Source: Ambulatory Visit

## 2014-12-19 ENCOUNTER — Emergency Department (HOSPITAL_COMMUNITY)
Admission: EM | Admit: 2014-12-19 | Discharge: 2014-12-19 | Disposition: A | Discharge status: Home / Self Care | Payer: Medicaid Other | Attending: Emergency Medicine | Admitting: Emergency Medicine

## 2014-12-19 ENCOUNTER — Emergency Department (HOSPITAL_COMMUNITY): Payer: Medicaid Other

## 2014-12-19 ENCOUNTER — Encounter (HOSPITAL_COMMUNITY): Payer: Self-pay | Admitting: *Deleted

## 2014-12-19 ENCOUNTER — Other Ambulatory Visit: Payer: Self-pay | Admitting: Oncology

## 2014-12-19 DIAGNOSIS — W1839XA Other fall on same level, initial encounter: Secondary | ICD-10-CM | POA: Diagnosis not present

## 2014-12-19 DIAGNOSIS — Z8614 Personal history of Methicillin resistant Staphylococcus aureus infection: Secondary | ICD-10-CM | POA: Diagnosis not present

## 2014-12-19 DIAGNOSIS — Y9389 Activity, other specified: Secondary | ICD-10-CM | POA: Diagnosis not present

## 2014-12-19 DIAGNOSIS — Z880 Allergy status to penicillin: Secondary | ICD-10-CM | POA: Diagnosis not present

## 2014-12-19 DIAGNOSIS — Y9289 Other specified places as the place of occurrence of the external cause: Secondary | ICD-10-CM | POA: Insufficient documentation

## 2014-12-19 DIAGNOSIS — Z87891 Personal history of nicotine dependence: Secondary | ICD-10-CM | POA: Insufficient documentation

## 2014-12-19 DIAGNOSIS — S20229A Contusion of unspecified back wall of thorax, initial encounter: Secondary | ICD-10-CM | POA: Diagnosis not present

## 2014-12-19 DIAGNOSIS — Y998 Other external cause status: Secondary | ICD-10-CM | POA: Diagnosis not present

## 2014-12-19 DIAGNOSIS — E7800 Pure hypercholesterolemia, unspecified: Secondary | ICD-10-CM | POA: Insufficient documentation

## 2014-12-19 DIAGNOSIS — E785 Hyperlipidemia, unspecified: Secondary | ICD-10-CM | POA: Insufficient documentation

## 2014-12-19 DIAGNOSIS — I10 Essential (primary) hypertension: Secondary | ICD-10-CM | POA: Diagnosis not present

## 2014-12-19 DIAGNOSIS — Z853 Personal history of malignant neoplasm of breast: Secondary | ICD-10-CM | POA: Diagnosis not present

## 2014-12-19 DIAGNOSIS — T148XXA Other injury of unspecified body region, initial encounter: Secondary | ICD-10-CM

## 2014-12-19 DIAGNOSIS — Z872 Personal history of diseases of the skin and subcutaneous tissue: Secondary | ICD-10-CM | POA: Diagnosis not present

## 2014-12-19 DIAGNOSIS — Z79899 Other long term (current) drug therapy: Secondary | ICD-10-CM | POA: Diagnosis not present

## 2014-12-19 DIAGNOSIS — J449 Chronic obstructive pulmonary disease, unspecified: Secondary | ICD-10-CM | POA: Diagnosis not present

## 2014-12-19 DIAGNOSIS — Z7982 Long term (current) use of aspirin: Secondary | ICD-10-CM | POA: Insufficient documentation

## 2014-12-19 DIAGNOSIS — S3992XA Unspecified injury of lower back, initial encounter: Secondary | ICD-10-CM | POA: Diagnosis present

## 2014-12-19 MED ORDER — HYDROCODONE-ACETAMINOPHEN 5-325 MG PO TABS: 2.0000 | ORAL_TABLET | ORAL | Status: DC | PRN

## 2014-12-19 MED ORDER — METHOCARBAMOL 500 MG PO TABS: 500.0000 mg | ORAL_TABLET | Freq: Three times a day (TID) | ORAL | Status: DC | PRN

## 2014-12-19 MED ORDER — HYDROCODONE-ACETAMINOPHEN 5-325 MG PO TABS
1.0000 | ORAL_TABLET | Freq: Once | ORAL | Status: AC
  Administered 2014-12-19: 1 via ORAL
  Filled 2014-12-19: qty 1

## 2014-12-19 MED ORDER — IBUPROFEN 800 MG PO TABS: 800.0000 mg | ORAL_TABLET | Freq: Three times a day (TID) | ORAL | Status: DC

## 2014-12-19 NOTE — ED Notes (Signed)
Patient d/c papers given and reviewed. Patient verbalized understanding, ambulatory out of dept.without difficulty.

## 2014-12-19 NOTE — ED Notes (Addendum)
Pt states she fell 4 days ago in stable. Mid-lower back pain. PCP told pt that he couldn't Rx anything for her pain. States she needs something to calm the pain down. Pt states stable floor collapsed and she fell ~8 ft. Pt is hypertensive 181/116 and has taken medications today.

## 2014-12-19 NOTE — ED Notes (Signed)
Patient requesting pain/anxiety meds,  EDP aware.

## 2014-12-19 NOTE — Discharge Instructions (Signed)
°  Your x-rays do not show any acute abnormalities of the bones in your back-no fractures.  Your pain is related to contusions (Bruising).  This is not dangerous and slowly improves with time.  There is no specific treatment   Slowly increase your activity.  Ice packs intermittently to painful areas in your back.   Medications as prescribed.

## 2014-12-19 NOTE — ED Notes (Signed)
Patient stated she needs to leave, EDP aware.

## 2014-12-19 NOTE — ED Provider Notes (Signed)
CSN: [id number]     Arrival date & time 12/19/14  1558 History   First MD Initiated Contact with Patient 12/19/14 1646     Chief Complaint  Patient presents with  . Back Pain      HPI  Patient presents for evaluation of back pain. [name] states that [name] was in her stable loading some hay from a wagon. [name] has horses and a neighbor gives her headache. [name] was loading it into the stable when [name] states the wood beneath her gave way and [name] fell through several pieces of wood. [name] states somehow [name] landed flat on her back on a wood flooring of the board. This happened 2-3 days ago. [name] states it "took my breath away". [name] is able to walk inside but has had pain and difficulty doing her normal ADLs secondary to pain in her back. Did not strike her head. Did not injure her neck. No loss of consciousness. No incontinence no weakness no lower extremity symptoms or radicular, myelopathic, or cauda equina symptoms  Past Medical History  Diagnosis Date  . COPD (chronic obstructive pulmonary disease) ([place]) 09/14/2010  . 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 H/O MRSA infection 09/14/2010  . Back muscle spasm 12/13/2010  . Psoriasis 12/13/2010  . Asthma   . Hypertension   . High blood cholesterol   . Hyperlipidemia 12/07/2012  . Tobacco abuse 12/07/2012  . Cancer [facility])     breast cancer   Past Surgical History  Procedure Laterality Date  . Portacath insertion    . Mastectomy      left  . Abdominal hysterectomy    . Cesarean section      x7  . Port-a-cath removal  06/01/2011    Procedure: REMOVAL PORT-A-CATH;  Surgeon: Donato Heinz, MD;  Location: [facility];  Service: General;  Laterality: N/A;  Minor Room  . Mastectomy     Family History  Problem Relation Age of Onset  . Stroke Mother   . Stroke Father   . Cancer Maternal Aunt   . Cancer Maternal Grandmother   . Cancer Maternal Aunt   . Cancer Maternal Aunt    Social History  Substance Use Topics  . Smoking status: Former Smoker  -- 0.25 packs/day for 36 years  . Smokeless tobacco: Never Used  . Alcohol Use: No   OB History    No data available     Review of Systems  Constitutional: Negative for fever, chills, diaphoresis, appetite change and fatigue.  HENT: Negative for mouth sores, sore throat and trouble swallowing.   Eyes: Negative for visual disturbance.  Respiratory: Negative for cough, chest tightness, shortness of breath and wheezing.   Cardiovascular: Negative for chest pain.  Gastrointestinal: Negative for nausea, vomiting, abdominal pain, diarrhea and abdominal distention.  Endocrine: Negative for polydipsia, polyphagia and polyuria.  Genitourinary: Negative for dysuria, frequency and hematuria.  Musculoskeletal: Positive for back pain. Negative for gait problem.  Skin: Negative for color change, pallor and rash.  Neurological: Negative for dizziness, syncope, light-headedness and headaches.  Hematological: Does not bruise/bleed easily.  Psychiatric/Behavioral: Negative for behavioral problems and confusion.      Allergies  Codeine and Penicillins  Home Medications   Prior to Admission medications   Medication Sig Start Date End Date Taking? Authorizing Provider  ALPRAZolam (XANAX) 0.5 MG tablet TAKE ONE TABLET BY MOUTH FOUR TIMES DAILY AS NEEDED. Patient taking differently: Take 0.5 mg by mouth 4 (four) times daily as needed for anxiety. Marland Kitchen  10/31/14  Yes Baird Cancer, PA-C  aspirin 81 MG tablet Take 81 mg by mouth daily.   Yes Historical Provider, MD  hydrochlorothiazide (HYDRODIURIL) 50 MG tablet Take 50 mg by mouth daily.   Yes Historical Provider, MD  lisinopril (PRINIVIL,ZESTRIL) 40 MG tablet Take 40 mg by mouth daily.   Yes Historical Provider, MD  metoprolol succinate (TOPROL-XL) 50 MG 24 hr tablet Take 50 mg by mouth daily. Take with or immediately following a meal.   Yes Historical Provider, MD  oxyCODONE-acetaminophen (PERCOCET) 7.5-325 MG tablet Take 1 tablet by mouth every 6  (six) hours as needed for moderate pain or severe pain.  11/28/14  Yes Historical Provider, MD  pravastatin (PRAVACHOL) 20 MG tablet Take 20 mg by mouth daily.   Yes Historical Provider, MD  prochlorperazine (COMPAZINE) 10 MG tablet TAKE ONE TABLET BY MOUTH EVERY 6 HOURS AS NEEDED FOR NAUSEA.   Yes Baird Cancer, PA-C  HYDROcodone-acetaminophen (NORCO/VICODIN) 5-325 MG tablet Take 2 tablets by mouth every 4 (four) hours as needed. 12/19/14   Tanna Furry, MD  ibuprofen (ADVIL,MOTRIN) 800 MG tablet Take 1 tablet (800 mg total) by mouth 3 (three) times daily. 12/19/14   Tanna Furry, MD  methocarbamol (ROBAXIN) 500 MG tablet Take 1 tablet (500 mg total) by mouth 3 (three) times daily between meals as needed. 12/19/14   Tanna Furry, MD   BP 185/85 mmHg  Pulse 65  Temp(Src) 98 F (36.7 C) (Oral)  Resp 16  Ht '5\' 7"'$  (1.702 m)  Wt 219 lb (99.338 kg)  BMI 34.29 kg/m2  SpO2 98% Physical Exam  Constitutional: [name] is oriented to person, place, and time. [name] appears well-developed and well-nourished. No distress.  HENT:  Head: Normocephalic.  Eyes: Conjunctivae are normal. Pupils are equal, round, and reactive to light. No scleral icterus.  Neck: Normal range of motion. Neck supple. No thyromegaly present.  Cardiovascular: Normal rate and regular rhythm.  Exam reveals no gallop and no friction rub.   No murmur heard. Pulmonary/Chest: Effort normal and breath sounds normal. No respiratory distress. [name] has no wheezes. [name] has no rales.  Abdominal: Soft. Bowel sounds are normal. [name] exhibits no distension. There is no tenderness. There is no rebound.  Musculoskeletal: Normal range of motion.       Back:  Neurological: [name] is alert and oriented to person, place, and time.  .Normal symmetric strength to flex/.extend hip and knees, dorsi/plantar flex ankles. Normal symmetric sensation to all distributions to LEs Patellar and achilles reflexes 1-2+. Downgoing Babinski   Skin: Skin is warm and dry. No  rash noted.  Psychiatric: [name] has a normal mood and affect. Her behavior is normal.    ED Course  Procedures (including critical care time) Labs Review Labs Reviewed - No data to display  Imaging Review Dg Thoracic Spine 2 View  12/19/2014  CLINICAL DATA:  Pain following fall EXAM: THORACIC SPINE 3 VIEWS COMPARISON:  Chest radiograph September 24, 2014 FINDINGS: Frontal, lateral, and swimmers views were obtained. No fracture or spondylolisthesis. There is mild disc space narrowing at several levels. No erosive change. 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 No fracture or spondylolisthesis. Electronically Signed   By: Lowella Grip III M.D.   On: 12/19/2014 18:19   Dg Lumbar Spine Complete  12/19/2014  CLINICAL DATA:  [place] through barn door 2 days ago.  Back pain. EXAM: LUMBAR SPINE - COMPLETE 4+ VIEW COMPARISON:  05/09/2013 FINDINGS: Chronic deformity involving bilateral transverse process at the  L1 vertebra noted. Mild curvature of the lumbar spine is convex towards the right. The vertebral body heights are well preserved. Mild multi level ventral endplate spurring noted. Aortic atherosclerosis identified. IMPRESSION: 1. No acute findings. 2. Mild degenerative change 3. Aortic atherosclerosis. Electronically Signed   By: Kerby Moors M.D.   On: 12/19/2014 17:56   I have personally reviewed and evaluated these images and lab results as part of my medical decision-making.   EKG Interpretation None      MDM   Final diagnoses:  Contusion    X-ray show no acute abnormalities. Plan is home limited number of Vicodin, Robaxin, ibuprofen.    Tanna Furry, MD 12/19/14 [phone]

## 2015-03-01 NOTE — Progress Notes (Signed)
No show

## 2015-03-01 NOTE — Assessment & Plan Note (Deleted)
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 Primary was 5.9 cm clinically and radiographically with 1 hypermetabolic lymph node.  At the time of surgery, cancer was still 2.5 cm in size, but 10 nodes were negative.  LVI was not identified.  ER negative, PR negative, Her2 negative.  Initial biopsy was on 06/18/2007 and her mastectomy was on 8/21/200  All therapy was completed on 04/18/2008. Noncompliant with appointment and surveillance recommendations.    Labs today: CBC diff, CMET.  Follow-up in one year.

## 2015-03-03 ENCOUNTER — Ambulatory Visit (HOSPITAL_COMMUNITY): Payer: Medicaid Other | Admitting: Oncology

## 2015-04-29 ENCOUNTER — Ambulatory Visit (HOSPITAL_COMMUNITY): Payer: Medicaid Other | Admitting: Oncology

## 2015-05-05 NOTE — Assessment & Plan Note (Addendum)
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 Primary was 5.9 cm clinically and radiographically with 1 hypermetabolic lymph node.  At the time of surgery, cancer was still 2.5 cm in size, but 10 nodes were negative.  LVI was not identified.  ER negative, PR negative, Her2 negative.  Initial biopsy was on 06/18/2007 and her mastectomy was on 8/21/200  All therapy was completed on 04/18/2008. Noncompliant with appointment and surveillance recommendations.    Labs today: CBC diff, CMET.  Labs in 12 months: CBC diff, CMET  I have changed her Xanax Rx to 1 mg tablets BID PRN #60 with refills.  These are 30 day supplies.  I have also escribed Bactroban to apply to her groin furuncle.  She is afebrile without cellulitis.  I have cultured the discharge.  Follow-up in one year.

## 2015-05-05 NOTE — Progress Notes (Signed)
Natalie Drafts, FNP [place] [address]  Malignant neoplasm of upper-outer quadrant of left female breast ([place]) - Plan: oxyCODONE-acetaminophen (PERCOCET/ROXICET) 5-325 MG tablet, hydrOXYzine (ATARAX/VISTARIL) 50 MG tablet, escitalopram (LEXAPRO) 10 MG tablet, losartan-hydrochlorothiazide (HYZAAR) 100-12.5 MG tablet, montelukast (SINGULAIR) 10 MG tablet, Moxifloxacin HCl (MOXEZA) 0.5 % SOLN, cyclobenzaprine (FLEXERIL) 10 MG tablet, ALPRAZolam (XANAX) 1 MG tablet, CBC with Differential, Comprehensive metabolic panel, Culture, routine-abscess, mupirocin ointment (BACTROBAN) 2 %, CBC with Differential, Comprehensive metabolic panel  Anxiety state - Plan: oxyCODONE-acetaminophen (PERCOCET/ROXICET) 5-325 MG tablet, hydrOXYzine (ATARAX/VISTARIL) 50 MG tablet, escitalopram (LEXAPRO) 10 MG tablet, losartan-hydrochlorothiazide (HYZAAR) 100-12.5 MG tablet, montelukast (SINGULAIR) 10 MG tablet, Moxifloxacin HCl (MOXEZA) 0.5 % SOLN, cyclobenzaprine (FLEXERIL) 10 MG tablet, ALPRAZolam (XANAX) 1 MG tablet, CBC with Differential, Comprehensive metabolic panel, Culture, routine-abscess, mupirocin ointment (BACTROBAN) 2 %, CBC with Differential, Comprehensive metabolic panel  H/O MRSA infection - Plan: oxyCODONE-acetaminophen (PERCOCET/ROXICET) 5-325 MG tablet, hydrOXYzine (ATARAX/VISTARIL) 50 MG tablet, escitalopram (LEXAPRO) 10 MG tablet, losartan-hydrochlorothiazide (HYZAAR) 100-12.5 MG tablet, montelukast (SINGULAIR) 10 MG tablet, Moxifloxacin HCl (MOXEZA) 0.5 % SOLN, cyclobenzaprine (FLEXERIL) 10 MG tablet, ALPRAZolam (XANAX) 1 MG tablet, CBC with Differential, Comprehensive metabolic panel, Culture, routine-abscess, mupirocin ointment (BACTROBAN) 2 %, CBC with Differential, Comprehensive metabolic panel  Furuncle of groin - Plan: oxyCODONE-acetaminophen (PERCOCET/ROXICET) 5-325 MG tablet, hydrOXYzine (ATARAX/VISTARIL) 50 MG tablet, escitalopram (LEXAPRO) 10 MG tablet,  losartan-hydrochlorothiazide (HYZAAR) 100-12.5 MG tablet, montelukast (SINGULAIR) 10 MG tablet, Moxifloxacin HCl (MOXEZA) 0.5 % SOLN, cyclobenzaprine (FLEXERIL) 10 MG tablet, ALPRAZolam (XANAX) 1 MG tablet, CBC with Differential, Comprehensive metabolic panel, Culture, routine-abscess, mupirocin ointment (BACTROBAN) 2 %, CBC with Differential, Comprehensive metabolic panel  CURRENT THERAPY: Surveillance per NCCN guidelines.  INTERVAL HISTORY: [name] 55 y.o. 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 Primary was 5.9 cm clinically and radiographically with 1 hypermetabolic lymph node.  At the time of surgery, cancer was still 2.5 cm in size, but 10 nodes were negative.  LVI was not identified.  ER negative, PR negative, Her2 negative.  Initial biopsy was on 06/18/2007 and her mastectomy was on 8/21/200  All therapy was completed on 04/18/2008.     H/O Stage III Breast cancer, left   06/18/2007 Initial Diagnosis H/O Stage III Breast cancer, ER-, PR-, Her2-.  Primary was 5.9 cm in the left breast.  Grade 3 tumor.  1 lymph node hypermetabolic on PET scan in 6295 suspicous for metastatic disease.   08/06/2007 - 09/18/2007 Chemotherapy Dose dense AC, pre-operatively   10/12/2007 Surgery B/L mastectomies by Dr. Arnoldo [name]   12/26/2007 - 04/18/2008 Chemotherapy Taxol weekly x 11   04/19/2008 Remission    I personally reviewed and went over laboratory results with the patient.  The results are noted within this dictation.  Labs will be updated today.  Chart reviewed.  ED visit in October 2016 is noted for back pain.  Imaging was negative for any acute findings.  She was prescribed a limited number of Vicodin, Robaxin, and Ibuprofen.  She was discharged to home.  She requests a refill on her Xanax and reports that she is taking 0.5 mg tablets, two at a time, twice daily as needed.  She is wondering if I can prescribe 1 mg  tablets.    She reports recurrent "boils."  She has a history of this in the past and she shows me a right posterior neck area that  is healing and nearly resolved.  She notes a right inguinal boil as well.  This is viewed on examination today.  She has a fiance.  She reports that he is white (in color).  She has a history of dating Hispanics.  She reports that he owns his own trucking company and does well for himself financially.  She reports that he is chivalrous.  She is happy with him.  However, she is used to be an independent woman and based upon our conversation today, I am concerned that she may have issues moving forward as he may limit her independence and she will not do well with this.  Past Medical History  Diagnosis Date  . COPD (chronic obstructive pulmonary disease) ([place]) 09/14/2010  . 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 H/O MRSA infection 09/14/2010  . Back muscle spasm 12/13/2010  . Psoriasis 12/13/2010  . Asthma   . Hypertension   . High blood cholesterol   . Hyperlipidemia 12/07/2012  . Tobacco abuse 12/07/2012  . Cancer [facility])     breast cancer    has Anxiety state; DEPRESSION; HYPERTENSION; H/O Stage III Breast cancer, left; COPD (chronic obstructive pulmonary disease) ([place]); Marijuana abuse in the past; H/O MRSA infection; Psoriasis; Hyperlipidemia; Tobacco abuse; Eye drainage; Conjunctivitis; and Back pain on her problem list.     is allergic to codeine and penicillins.  [name] had no medications administered during this visit.  Past Surgical History  Procedure Laterality Date  . Portacath insertion    . Mastectomy      left  . Abdominal hysterectomy    . Cesarean section      x7  . Port-a-cath removal  06/01/2011    Procedure: REMOVAL PORT-A-CATH;  Surgeon: Natalie Heinz, MD;  Location: [facility];  Service: General;  Laterality: N/A;  Minor Room  . Mastectomy      Denies any headaches, dizziness, double vision, fevers, chills, night  sweats, nausea, vomiting, diarrhea, constipation, chest pain, heart palpitations, shortness of breath, blood in stool, black tarry stool, urinary pain, urinary burning, urinary frequency, hematuria.   PHYSICAL EXAMINATION  ECOG PERFORMANCE STATUS: 0 - Asymptomatic  There were no vitals filed for this visit.  GENERAL:alert, no distress, well nourished, well developed, comfortable, cooperative, obese and smiling, and unaccompanied SKIN: skin color, texture, turgor are normal HEAD: Normocephalic, No masses, lesions, tenderness or abnormalities EYES: EOMI, sclera are white. EARS: External ears normal OROPHARYNX:mucous membranes are moist  NECK: supple, no adenopathy, thyroid normal size, non-tender, without nodularity, no stridor, trachea midline LYMPH:  no palpable lymphadenopathy BREAST: B/L mastectomies that are well healed and without masses, lesions, skin changes, or other abnormalities. LUNGS: clear to auscultation and percussion without wheezes, rales, rhonchi. HEART: regular rate & rhythm without murmur, rub, or gallop.  Normal S1 and S2. ABDOMEN: abdomen soft, obese and normal bowel sounds BACK: Back symmetric, no curvature. EXTREMITIES:less then 2 second capillary refill, no joint deformities, effusion, or inflammation, no cyanosis.  Right groin fluctuant, erythematous lesion with purulent discharge on palpation. NEURO: alert & oriented x 3 with fluent speech, no focal motor/sensory deficits, gait normal   LABORATORY DATA: CBC    Component Value Date/Time   WBC 8.9 09/24/2014 1730   RBC 4.75 09/24/2014 1730   HGB 15.0 09/24/2014 1730   HCT 44.6 09/24/2014 1730   PLT 242 09/24/2014 1730   MCV 93.9 09/24/2014 1730   MCH 31.6 09/24/2014 1730   MCHC 33.6 09/24/2014 1730   RDW 14.7  09/24/2014 1730   LYMPHSABS 2.3 10/05/2012 1020   MONOABS 0.3 10/05/2012 1020   EOSABS 0.1 10/05/2012 1020   BASOSABS 0.0 10/05/2012 1020      Chemistry      Component Value Date/Time   NA  141 09/24/2014 1730   K 4.3 09/24/2014 1730   CL 104 09/24/2014 1730   CO2 30 09/24/2014 1730   BUN 10 09/24/2014 1730   CREATININE 0.87 09/24/2014 1730      Component Value Date/Time   CALCIUM 9.8 09/24/2014 1730   ALKPHOS 89 04/13/2011 1124   AST 27 04/13/2011 1124   ALT 27 04/13/2011 1124   BILITOT 0.3 04/13/2011 1124        ASSESSMENT AND PLAN:  H/O Stage III Breast cancer, left 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 Primary was 5.9 cm clinically and radiographically with 1 hypermetabolic lymph node.  At the time of surgery, cancer was still 2.5 cm in size, but 10 nodes were negative.  LVI was not identified.  ER negative, PR negative, Her2 negative.  Initial biopsy was on 06/18/2007 and her mastectomy was on 8/21/200  All therapy was completed on 04/18/2008. Noncompliant with appointment and surveillance recommendations.    Labs today: CBC diff, CMET.  Labs in 12 months: CBC diff, CMET  I have changed her Xanax Rx to 1 mg tablets BID PRN #60 with refills.  These are 30 day supplies.  I have also escribed Bactroban to apply to her groin furuncle.  She is afebrile without cellulitis.  I have cultured the discharge.  Follow-up in one year.   THERAPY PLAN:  NCCN guidelines recommends the following surveillance for invasive breast cancer (2.2016):  A. History and Physical exam 1-4 times per year as clinically appropriate for 5 years, then annually.  B. Periodic screening for changes in family history and referral to genetics counseling as indicated  C. Educate, monitor, and refer to lymphedema management.  D. Mammography every 12 months  E. Routine imaging of reconstructed breast is not indicated.  F. In the absence of clinical signs and symptoms suggestive of recurrent disease, there is no indication for laboratory or imaging studies for metastases screening.  G. Women on Tamoxifen: annual gynecologic assessment every 12 months  if uterus is present.  H. Women on aromatase inhibitor or who experience ovarian failure secondary to treatment should have monitoring of bone health with a bone mineral density determination at baseline and periodically thereafter.  I. Assess and encourage adherence to adjuvant endocrine therapy.  J. Evidence suggests that active lifestyle, healthy diet, limited alcohol intake, and achieving and maintaining an ideal body weight (20-25 BMI) may lead to optimal breast cancer outcomes.   All questions were answered. The patient knows to call the clinic with any problems, questions or concerns. We can certainly see the patient much sooner if necessary.  Patient and plan discussed with Dr. Ancil Linsey and she is in agreement with the aforementioned.   Melana Hingle 05/06/2015 4:26 PM

## 2015-05-06 ENCOUNTER — Other Ambulatory Visit (HOSPITAL_COMMUNITY): Payer: Medicaid Other

## 2015-05-06 ENCOUNTER — Encounter (HOSPITAL_COMMUNITY): Payer: Medicaid Other | Attending: Hematology & Oncology | Admitting: Oncology

## 2015-05-06 ENCOUNTER — Encounter (HOSPITAL_COMMUNITY): Payer: Self-pay | Admitting: Oncology

## 2015-05-06 DIAGNOSIS — Z9012 Acquired absence of left breast and nipple: Secondary | ICD-10-CM | POA: Insufficient documentation

## 2015-05-06 DIAGNOSIS — Z171 Estrogen receptor negative status [ER-]: Secondary | ICD-10-CM | POA: Insufficient documentation

## 2015-05-06 DIAGNOSIS — C50412 Malignant neoplasm of upper-outer quadrant of left female breast: Secondary | ICD-10-CM

## 2015-05-06 DIAGNOSIS — A4902 Methicillin resistant Staphylococcus aureus infection, unspecified site: Secondary | ICD-10-CM

## 2015-05-06 DIAGNOSIS — J45909 Unspecified asthma, uncomplicated: Secondary | ICD-10-CM | POA: Insufficient documentation

## 2015-05-06 DIAGNOSIS — F419 Anxiety disorder, unspecified: Secondary | ICD-10-CM | POA: Insufficient documentation

## 2015-05-06 DIAGNOSIS — F1210 Cannabis abuse, uncomplicated: Secondary | ICD-10-CM | POA: Insufficient documentation

## 2015-05-06 DIAGNOSIS — E785 Hyperlipidemia, unspecified: Secondary | ICD-10-CM | POA: Diagnosis not present

## 2015-05-06 DIAGNOSIS — Z9889 Other specified postprocedural states: Secondary | ICD-10-CM | POA: Insufficient documentation

## 2015-05-06 DIAGNOSIS — L02229 Furuncle of trunk, unspecified: Secondary | ICD-10-CM | POA: Diagnosis not present

## 2015-05-06 DIAGNOSIS — F411 Generalized anxiety disorder: Secondary | ICD-10-CM

## 2015-05-06 DIAGNOSIS — I10 Essential (primary) hypertension: Secondary | ICD-10-CM | POA: Diagnosis not present

## 2015-05-06 DIAGNOSIS — J449 Chronic obstructive pulmonary disease, unspecified: Secondary | ICD-10-CM | POA: Diagnosis not present

## 2015-05-06 DIAGNOSIS — L02224 Furuncle of groin: Secondary | ICD-10-CM

## 2015-05-06 DIAGNOSIS — Z8614 Personal history of Methicillin resistant Staphylococcus aureus infection: Secondary | ICD-10-CM | POA: Diagnosis not present

## 2015-05-06 DIAGNOSIS — Z853 Personal history of malignant neoplasm of breast: Secondary | ICD-10-CM

## 2015-05-06 MED ORDER — ALPRAZOLAM 1 MG PO TABS: 1.0000 mg | ORAL_TABLET | Freq: Two times a day (BID) | ORAL | Status: DC | PRN

## 2015-05-06 MED ORDER — MUPIROCIN 2 % EX OINT: 1.0000 "application " | TOPICAL_OINTMENT | Freq: Two times a day (BID) | CUTANEOUS | Status: DC

## 2015-05-06 NOTE — Patient Instructions (Signed)
..  [place] at [facility] Discharge Instructions  RECOMMENDATIONS MADE BY THE CONSULTANT AND ANY TEST RESULTS WILL BE SENT TO YOUR REFERRING PHYSICIAN.  Labs today and in 1 year Antibiotic ointment for your boil Xanax prescription given  Thank you for choosing [place] at [facility] to provide your oncology and hematology care.  To afford each patient quality time with our provider, please arrive at least 15 minutes before your scheduled appointment time.   Beginning January 23rd 2017 lab work for the [facility] will be done in the  Main lab at [facility] on 1st floor. If you have a lab appointment with the [place] please come in thru the  Main Entrance and check in at the main information desk  You need to re-schedule your appointment should you arrive 10 or more minutes late.  We strive to give you quality time with our providers, and arriving late affects you and other patients whose appointments are after yours.  Also, if you no show three or more times for appointments you may be dismissed from the clinic at the providers discretion.     Again, thank you for choosing [facility].  Our hope is that these requests will decrease the amount of time that you wait before being seen by our physicians.       _____________________________________________________________  Should you have questions after your visit to [facility], please contact our office at [phone] between the hours of 8:30 a.m. and 4:30 p.m.  Voicemails left after 4:30 p.m. will not be returned until the following business day.  For prescription refill requests, have your pharmacy contact our office.         Resources For Cancer Patients and their Caregivers ? American Cancer Society: Can assist with transportation, wigs, general needs, runs Look Good Feel Better.        ([phone] ? Cancer Care: Provides financial  assistance, online support groups, medication/co-pay assistance.  1-800-813-HOPE 440-036-0126) ? [facility] Assists [place] cancer patients and their families through emotional , educational and financial support.  608-607-3332 ? [place] DSS Where to apply for food stamps, Medicaid and utility assistance. 407-584-5649 ? [facility]: Transportation to medical appointments. 229-794-4308 ? Social Security Administration: May apply for disability if have a Stage IV cancer. 303-294-8920 3300304714 ? [employer], Disability and Transit Services: Assists with nutrition, care and transit needs. [phone]

## 2015-05-08 LAB — CULTURE, ROUTINE-ABSCESS: Gram Stain: NONE SEEN

## 2015-05-13 ENCOUNTER — Encounter (HOSPITAL_COMMUNITY): Payer: Medicaid Other

## 2015-05-13 DIAGNOSIS — C50412 Malignant neoplasm of upper-outer quadrant of left female breast: Secondary | ICD-10-CM | POA: Diagnosis not present

## 2015-05-13 DIAGNOSIS — A4902 Methicillin resistant Staphylococcus aureus infection, unspecified site: Secondary | ICD-10-CM

## 2015-05-13 DIAGNOSIS — F411 Generalized anxiety disorder: Secondary | ICD-10-CM

## 2015-05-13 DIAGNOSIS — L02224 Furuncle of groin: Secondary | ICD-10-CM

## 2015-05-13 LAB — COMPREHENSIVE METABOLIC PANEL
ALK PHOS: 78 U/L (ref 38–126)
ALT: 16 U/L (ref 14–54)
ANION GAP: 7 (ref 5–15)
AST: 19 U/L (ref 15–41)
Albumin: 3.8 g/dL (ref 3.5–5.0)
BILIRUBIN TOTAL: 0.3 mg/dL (ref 0.3–1.2)
BUN: 13 mg/dL (ref 6–20)
CALCIUM: 9 mg/dL (ref 8.9–10.3)
CO2: 29 mmol/L (ref 22–32)
CREATININE: 0.74 mg/dL (ref 0.44–1.00)
Chloride: 103 mmol/L (ref 101–111)
Glucose, Bld: 133 mg/dL — ABNORMAL HIGH (ref 65–99)
Potassium: 3.8 mmol/L (ref 3.5–5.1)
SODIUM: 139 mmol/L (ref 135–145)
TOTAL PROTEIN: 7.2 g/dL (ref 6.5–8.1)

## 2015-05-13 LAB — CBC WITH DIFFERENTIAL/PLATELET
Basophils Absolute: 0 10*3/uL (ref 0.0–0.1)
Basophils Relative: 0 %
EOS ABS: 0.1 10*3/uL (ref 0.0–0.7)
Eosinophils Relative: 1 %
HEMATOCRIT: 41.2 % (ref 36.0–46.0)
HEMOGLOBIN: 13.9 g/dL (ref 12.0–15.0)
LYMPHS ABS: 2.5 10*3/uL (ref 0.7–4.0)
LYMPHS PCT: 31 %
MCH: 31.8 pg (ref 26.0–34.0)
MCHC: 33.7 g/dL (ref 30.0–36.0)
MCV: 94.3 fL (ref 78.0–100.0)
MONOS PCT: 5 %
Monocytes Absolute: 0.4 10*3/uL (ref 0.1–1.0)
NEUTROS PCT: 63 %
Neutro Abs: 5.1 10*3/uL (ref 1.7–7.7)
Platelets: 246 10*3/uL (ref 150–400)
RBC: 4.37 MIL/uL (ref 3.87–5.11)
RDW: 14.6 % (ref 11.5–15.5)
WBC: 8 10*3/uL (ref 4.0–10.5)

## 2015-06-10 ENCOUNTER — Telehealth (HOSPITAL_COMMUNITY): Payer: Self-pay | Admitting: *Deleted

## 2015-06-10 NOTE — Telephone Encounter (Signed)
This will need triaged by nursing.  TK

## 2015-06-11 ENCOUNTER — Telehealth (HOSPITAL_COMMUNITY): Payer: Self-pay | Admitting: Emergency Medicine

## 2015-06-11 ENCOUNTER — Emergency Department (HOSPITAL_COMMUNITY)
Admission: EM | Admit: 2015-06-11 | Discharge: 2015-06-11 | Disposition: A | Discharge status: Home / Self Care | Payer: Medicaid Other | Attending: Emergency Medicine | Admitting: Emergency Medicine

## 2015-06-11 ENCOUNTER — Encounter (HOSPITAL_COMMUNITY): Payer: Self-pay | Admitting: Emergency Medicine

## 2015-06-11 DIAGNOSIS — S39012A Strain of muscle, fascia and tendon of lower back, initial encounter: Secondary | ICD-10-CM | POA: Diagnosis not present

## 2015-06-11 DIAGNOSIS — Y999 Unspecified external cause status: Secondary | ICD-10-CM | POA: Insufficient documentation

## 2015-06-11 DIAGNOSIS — M549 Dorsalgia, unspecified: Secondary | ICD-10-CM

## 2015-06-11 DIAGNOSIS — G8929 Other chronic pain: Secondary | ICD-10-CM

## 2015-06-11 DIAGNOSIS — E785 Hyperlipidemia, unspecified: Secondary | ICD-10-CM | POA: Diagnosis not present

## 2015-06-11 DIAGNOSIS — Z79899 Other long term (current) drug therapy: Secondary | ICD-10-CM | POA: Insufficient documentation

## 2015-06-11 DIAGNOSIS — Y93F2 Activity, caregiving, lifting: Secondary | ICD-10-CM | POA: Insufficient documentation

## 2015-06-11 DIAGNOSIS — M545 Low back pain: Secondary | ICD-10-CM | POA: Diagnosis present

## 2015-06-11 DIAGNOSIS — J449 Chronic obstructive pulmonary disease, unspecified: Secondary | ICD-10-CM | POA: Insufficient documentation

## 2015-06-11 DIAGNOSIS — I10 Essential (primary) hypertension: Secondary | ICD-10-CM | POA: Insufficient documentation

## 2015-06-11 DIAGNOSIS — Y929 Unspecified place or not applicable: Secondary | ICD-10-CM | POA: Insufficient documentation

## 2015-06-11 DIAGNOSIS — Z87891 Personal history of nicotine dependence: Secondary | ICD-10-CM | POA: Insufficient documentation

## 2015-06-11 DIAGNOSIS — X500XXA Overexertion from strenuous movement or load, initial encounter: Secondary | ICD-10-CM | POA: Insufficient documentation

## 2015-06-11 HISTORY — DX: Dorsalgia, unspecified: M54.9

## 2015-06-11 HISTORY — DX: Other chronic pain: G89.29

## 2015-06-11 MED ORDER — DICLOFENAC SODIUM 75 MG PO TBEC: 75.0000 mg | DELAYED_RELEASE_TABLET | Freq: Two times a day (BID) | ORAL | Status: DC

## 2015-06-11 MED ORDER — CYCLOBENZAPRINE HCL 10 MG PO TABS: 10.0000 mg | ORAL_TABLET | Freq: Three times a day (TID) | ORAL | Status: DC

## 2015-06-11 MED ORDER — DIAZEPAM 5 MG PO TABS
10.0000 mg | ORAL_TABLET | Freq: Once | ORAL | Status: AC
  Administered 2015-06-11: 10 mg via ORAL
  Filled 2015-06-11: qty 2

## 2015-06-11 MED ORDER — KETOROLAC TROMETHAMINE 10 MG PO TABS
10.0000 mg | ORAL_TABLET | Freq: Once | ORAL | Status: AC
  Administered 2015-06-11: 10 mg via ORAL
  Filled 2015-06-11: qty 1

## 2015-06-11 NOTE — ED Notes (Signed)
C/o lower back pain and legs pain for 2 weeks after moving dressers at her house.

## 2015-06-11 NOTE — Discharge Instructions (Signed)
Please apply heating pad to the upper and lower back area. Use diclofenac with food. Flexeril may cause drowsiness, use with caution. See Dr Ouida Sills for evaluation and management. Chronic Back Pain  When back pain lasts longer than 3 months, it is called chronic back pain.People with chronic back pain often go through certain periods that are more intense (flare-ups).  CAUSES Chronic back pain can be caused by wear and tear (degeneration) on different structures in your back. These structures include:  The bones of your spine (vertebrae) and the joints surrounding your spinal cord and nerve roots (facets).  The strong, fibrous tissues that connect your vertebrae (ligaments). Degeneration of these structures may result in pressure on your nerves. This can lead to constant pain. HOME CARE INSTRUCTIONS  Avoid bending, heavy lifting, prolonged sitting, and activities which make the problem worse.  Take brief periods of rest throughout the day to reduce your pain. Lying down or standing usually is better than sitting while you are resting.  Take over-the-counter or prescription medicines only as directed by your caregiver. SEEK IMMEDIATE MEDICAL CARE IF:   You have weakness or numbness in one of your legs or feet.  You have trouble controlling your bladder or bowels.  You have nausea, vomiting, abdominal pain, shortness of breath, or fainting.   This information is not intended to replace advice given to you by your health care provider. Make sure you discuss any questions you have with your health care provider.   Document Released: 03/17/2004 Document Revised: 05/02/2011 Document Reviewed: 07/28/2014 Elsevier Interactive Patient Education [employer].

## 2015-06-11 NOTE — ED Provider Notes (Signed)
CSN: [id number]     Arrival date & time 06/11/15  1624 History   First MD Initiated Contact with Patient 06/11/15 1714     Chief Complaint  Patient presents with  . Back Pain     (Consider location/radiation/quality/duration/timing/severity/associated sxs/prior Treatment) Patient is a 55 y.o. female presenting with back pain. The history is provided by the patient.  Back Pain Location:  Lumbar spine Quality:  Aching (spasming) Radiates to:  R shoulder Pain severity:  Moderate Pain is:  Worse during the night Onset quality:  Gradual Duration:  2 weeks Timing:  Intermittent Progression:  Unchanged Chronicity: acute on chronic. Context: lifting heavy objects   Relieved by:  Nothing Worsened by:  Movement Associated symptoms: no bladder incontinence, no bowel incontinence, no numbness and no perianal numbness     Past Medical History  Diagnosis Date  . COPD (chronic obstructive pulmonary disease) ([place]) 09/14/2010  . 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 H/O MRSA infection 09/14/2010  . Back muscle spasm 12/13/2010  . Psoriasis 12/13/2010  . Asthma   . Hypertension   . High blood cholesterol   . Hyperlipidemia 12/07/2012  . Tobacco abuse 12/07/2012  . Cancer [facility])     breast cancer  . Chronic back pain    Past Surgical History  Procedure Laterality Date  . Portacath insertion    . Mastectomy      left  . Abdominal hysterectomy    . Cesarean section      x7  . Port-a-cath removal  06/01/2011    Procedure: REMOVAL PORT-A-CATH;  Surgeon: Donato Heinz, MD;  Location: [facility];  Service: General;  Laterality: N/A;  Minor Room  . Mastectomy     Family History  Problem Relation Age of Onset  . Stroke Mother   . Stroke Father   . Cancer Maternal Aunt   . Cancer Maternal Grandmother   . Cancer Maternal Aunt   . Cancer Maternal Aunt    Social History  Substance Use Topics  . Smoking status: Former Smoker -- 0.25 packs/day for 36 years  . Smokeless tobacco: Never  Used  . Alcohol Use: No   OB History    No data available     Review of Systems  Gastrointestinal: Negative for bowel incontinence.  Genitourinary: Negative for bladder incontinence.  Musculoskeletal: Positive for back pain. Negative for gait problem.  Neurological: Negative for numbness.  All other systems reviewed and are negative.     Allergies  Codeine and Penicillins  Home Medications   Prior to Admission medications   Medication Sig Start Date End Date Taking? Authorizing Provider  ALPRAZolam Duanne Moron) 1 MG tablet Take 1 tablet (1 mg total) by mouth 2 (two) times daily as needed for anxiety. 05/06/15   Baird Cancer, PA-C  aspirin 81 MG tablet Take 81 mg by mouth daily.    Historical Provider, MD  cyclobenzaprine (FLEXERIL) 10 MG tablet Take 10 mg by mouth 3 (three) times daily as needed for muscle spasms.    Historical Provider, MD  escitalopram (LEXAPRO) 10 MG tablet Take 10 mg by mouth daily.    Historical Provider, MD  hydrochlorothiazide (HYDRODIURIL) 50 MG tablet Take 50 mg by mouth daily.    Historical Provider, MD  HYDROcodone-acetaminophen (NORCO/VICODIN) 5-325 MG tablet Take 2 tablets by mouth every 4 (four) hours as needed. 12/19/14   Tanna Furry, MD  hydrOXYzine (ATARAX/VISTARIL) 50 MG tablet Take 50 mg by mouth 3 (three) times daily as needed.  Historical Provider, MD  ibuprofen (ADVIL,MOTRIN) 800 MG tablet Take 1 tablet (800 mg total) by mouth 3 (three) times daily. Patient not taking: Reported on 05/06/2015 12/19/14   Tanna Furry, MD  lisinopril (PRINIVIL,ZESTRIL) 40 MG tablet Take 40 mg by mouth daily. Reported on 05/06/2015    Historical Provider, MD  losartan-hydrochlorothiazide (HYZAAR) 100-12.5 MG tablet Take 1 tablet by mouth daily.    Historical Provider, MD  methocarbamol (ROBAXIN) 500 MG tablet Take 1 tablet (500 mg total) by mouth 3 (three) times daily between meals as needed. Patient not taking: Reported on 05/06/2015 12/19/14   Tanna Furry, MD   metoprolol succinate (TOPROL-XL) 50 MG 24 hr tablet Take 50 mg by mouth daily. Take with or immediately following a meal.    Historical Provider, MD  montelukast (SINGULAIR) 10 MG tablet Take 10 mg by mouth at bedtime.    Historical Provider, MD  Moxifloxacin HCl (MOXEZA) 0.5 % SOLN Apply to eye.    Historical Provider, MD  mupirocin ointment (BACTROBAN) 2 % Apply 1 application topically 2 (two) times daily. 05/06/15   Baird Cancer, PA-C  oxyCODONE-acetaminophen (PERCOCET) 7.5-325 MG tablet Take 1 tablet by mouth every 6 (six) hours as needed for moderate pain or severe pain. Reported on 05/06/2015 11/28/14   Historical Provider, MD  oxyCODONE-acetaminophen (PERCOCET/ROXICET) 5-325 MG tablet Take 1 tablet by mouth every 8 (eight) hours. 04/27/15   Historical Provider, MD  pravastatin (PRAVACHOL) 20 MG tablet Take 20 mg by mouth daily. Reported on 05/06/2015    Historical Provider, MD  prochlorperazine (COMPAZINE) 10 MG tablet TAKE ONE TABLET BY MOUTH EVERY 6 HOURS AS NEEDED FOR NAUSEA. Patient not taking: Reported on 05/06/2015    Manon Hilding Kefalas, PA-C   BP 154/75 mmHg  Pulse 77  Temp(Src)   Resp 16  SpO2 98% Physical Exam  Constitutional: She is oriented to person, place, and time. She appears well-developed and well-nourished.  Non-toxic appearance.  HENT:  Head: Normocephalic.  Right Ear: Tympanic membrane and external ear normal.  Left Ear: Tympanic membrane and external ear normal.  Eyes: EOM and lids are normal. Pupils are equal, round, and reactive to light.  Neck: Normal range of motion. Neck supple. Carotid bruit is not present.  Cardiovascular: Normal rate, regular rhythm, normal heart sounds, intact distal pulses and normal pulses.   Pulmonary/Chest: Breath sounds normal. No respiratory distress.  Abdominal: Soft. Bowel sounds are normal. There is no tenderness. There is no guarding.  Musculoskeletal: Normal range of motion.       Lumbar back: She exhibits pain and spasm.   Lymphadenopathy:       Head (right side): No submandibular adenopathy present.       Head (left side): No submandibular adenopathy present.    She has no cervical adenopathy.  Neurological: She is alert and oriented to person, place, and time. She has normal strength. No cranial nerve deficit or sensory deficit.  Gait steady. NO foot drop. No weakness of the upper or lower extremity.  Skin: Skin is warm and dry.  Psychiatric: Her speech is normal. Her mood appears anxious.  Nursing note and vitals reviewed.   ED Course  Procedures (including critical care time) Labs Review Labs Reviewed - No data to display  Imaging Review No results found. I have personally reviewed and evaluated these images and lab results as part of my medical decision-making.   EKG Interpretation None      MDM  No gross neurologic deficits appreciated. No foot  drop or evidence of caudal equina . Patient requesting oxycodone for her pain. Patient informed that opioid medications are not used for chronic related issues. It was suggested to the patient to use heat. A prescription for Flexeril and diclofenac were given to the patient. The patient strongly advised to see her primary physician for additional evaluation and management.    Final diagnoses:  Lumbar strain, initial encounter  Chronic back pain    *I have reviewed nursing notes, vital signs, and all appropriate lab and imaging results for this patient.[address] PA-C 06/11/15 1928  Tanna Furry, MD 06/19/15 [phone]

## 2015-06-11 NOTE — Telephone Encounter (Signed)
Pt wanted to know if Gershon Mussel would increase the amount of Xanax she is taking.  Spoke with Caroleen Hamman, he said the reason why its only two pills a day is because they increased the mg of the pills she was taking.  He could not write her for anymore xanaxs, pt verbalized understanding

## 2015-07-02 ENCOUNTER — Emergency Department (HOSPITAL_COMMUNITY)
Admission: EM | Admit: 2015-07-02 | Discharge: 2015-07-02 | Disposition: A | Discharge status: Home / Self Care | Payer: Medicaid Other | Attending: Emergency Medicine | Admitting: Emergency Medicine

## 2015-07-02 ENCOUNTER — Emergency Department (HOSPITAL_COMMUNITY): Payer: Medicaid Other

## 2015-07-02 ENCOUNTER — Encounter (HOSPITAL_COMMUNITY): Payer: Self-pay | Admitting: Emergency Medicine

## 2015-07-02 DIAGNOSIS — E785 Hyperlipidemia, unspecified: Secondary | ICD-10-CM | POA: Insufficient documentation

## 2015-07-02 DIAGNOSIS — J449 Chronic obstructive pulmonary disease, unspecified: Secondary | ICD-10-CM | POA: Diagnosis not present

## 2015-07-02 DIAGNOSIS — I10 Essential (primary) hypertension: Secondary | ICD-10-CM | POA: Insufficient documentation

## 2015-07-02 DIAGNOSIS — T40604A Poisoning by unspecified narcotics, undetermined, initial encounter: Secondary | ICD-10-CM | POA: Diagnosis not present

## 2015-07-02 DIAGNOSIS — Z7982 Long term (current) use of aspirin: Secondary | ICD-10-CM | POA: Diagnosis not present

## 2015-07-02 DIAGNOSIS — R4701 Aphasia: Secondary | ICD-10-CM | POA: Diagnosis not present

## 2015-07-02 DIAGNOSIS — F17200 Nicotine dependence, unspecified, uncomplicated: Secondary | ICD-10-CM | POA: Diagnosis not present

## 2015-07-02 DIAGNOSIS — J45909 Unspecified asthma, uncomplicated: Secondary | ICD-10-CM | POA: Diagnosis not present

## 2015-07-02 DIAGNOSIS — R079 Chest pain, unspecified: Secondary | ICD-10-CM

## 2015-07-02 DIAGNOSIS — Z79899 Other long term (current) drug therapy: Secondary | ICD-10-CM | POA: Insufficient documentation

## 2015-07-02 DIAGNOSIS — R072 Precordial pain: Secondary | ICD-10-CM | POA: Diagnosis present

## 2015-07-02 HISTORY — DX: Pain in unspecified foot: M79.673

## 2015-07-02 HISTORY — DX: Other chronic pain: G89.29

## 2015-07-02 LAB — COMPREHENSIVE METABOLIC PANEL
ALT: 22 U/L (ref 14–54)
AST: 26 U/L (ref 15–41)
Albumin: 3.9 g/dL (ref 3.5–5.0)
Alkaline Phosphatase: 69 U/L (ref 38–126)
Anion gap: 6 (ref 5–15)
BILIRUBIN TOTAL: 0.3 mg/dL (ref 0.3–1.2)
BUN: 19 mg/dL (ref 6–20)
CALCIUM: 9.5 mg/dL (ref 8.9–10.3)
CO2: 33 mmol/L — ABNORMAL HIGH (ref 22–32)
CREATININE: 1.04 mg/dL — AB (ref 0.44–1.00)
Chloride: 101 mmol/L (ref 101–111)
GFR calc Af Amer: >60 mL/min (ref >60)
Glucose, Bld: 124 mg/dL — ABNORMAL HIGH (ref 65–99)
Potassium: 4 mmol/L (ref 3.5–5.1)
Sodium: 140 mmol/L (ref 135–145)
Total Protein: 7.5 g/dL (ref 6.5–8.1)

## 2015-07-02 LAB — CBC WITH DIFFERENTIAL/PLATELET
BASOS PCT: 0 %
Basophils Absolute: 0 10*3/uL (ref 0.0–0.1)
EOS ABS: 0.1 10*3/uL (ref 0.0–0.7)
EOS PCT: 1 %
HCT: 42.9 % (ref 36.0–46.0)
HEMOGLOBIN: 14.1 g/dL (ref 12.0–15.0)
Lymphocytes Relative: 43 %
Lymphs Abs: 3.3 10*3/uL (ref 0.7–4.0)
MCH: 31.6 pg (ref 26.0–34.0)
MCHC: 32.9 g/dL (ref 30.0–36.0)
MCV: 96.2 fL (ref 78.0–100.0)
MONOS PCT: 5 %
Monocytes Absolute: 0.4 10*3/uL (ref 0.1–1.0)
NEUTROS PCT: 51 %
Neutro Abs: 3.9 10*3/uL (ref 1.7–7.7)
PLATELETS: 223 10*3/uL (ref 150–400)
RBC: 4.46 MIL/uL (ref 3.87–5.11)
RDW: 14.4 % (ref 11.5–15.5)
WBC: 7.7 10*3/uL (ref 4.0–10.5)

## 2015-07-02 LAB — ACETAMINOPHEN LEVEL

## 2015-07-02 LAB — SALICYLATE LEVEL: Salicylate Lvl: <4 mg/dL (ref 2.8–30.0)

## 2015-07-02 LAB — TROPONIN I: TROPONIN I: 0.04 ng/mL — AB (ref <0.031)

## 2015-07-02 LAB — APTT: aPTT: 27 seconds (ref 24–37)

## 2015-07-02 LAB — PROTIME-INR
INR: 1.01 (ref 0.00–1.49)
PROTHROMBIN TIME: 13.5 s (ref 11.6–15.2)

## 2015-07-02 LAB — ETHANOL: Alcohol, Ethyl (B): <5 mg/dL (ref <5)

## 2015-07-02 MED ORDER — SODIUM CHLORIDE 0.9 % IV BOLUS (SEPSIS)
500.0000 mL | Freq: Once | INTRAVENOUS | Status: AC
  Administered 2015-07-02: 500 mL via INTRAVENOUS

## 2015-07-02 MED ORDER — NALOXONE HCL 0.4 MG/ML IJ SOLN
0.4000 mg | Freq: Once | INTRAMUSCULAR | Status: AC
Start: 2015-07-02 — End: 2015-07-02
  Administered 2015-07-02: 0.4 mg via INTRAVENOUS
  Filled 2015-07-02: qty 1

## 2015-07-02 MED ORDER — SODIUM CHLORIDE 0.9 % IV SOLN: INTRAVENOUS | Status: DC

## 2015-07-02 NOTE — ED Notes (Signed)
Pt c/o right chest pain that is sharp with radiation to left arm that is tight. Pt is alert/oriented.  States family told her they noticed her slurring her speech x 3 days ago. Pt was able to move from w/c to stretcher but slowly. No slurred speech, arm drift/leg drift noted in triage.  Pupils equal and reactive. A&O. Nondiaphoretic.

## 2015-07-02 NOTE — ED Notes (Signed)
Pt refused to stay. Signed herself out AMA. Pt started taking BP cuff and refused to stay. Demanded IV be removed. Pt left ED. Dr. Thurnell Garbe informed.

## 2015-07-02 NOTE — ED Provider Notes (Signed)
CSN: [id number]     Arrival date & time 07/02/15  1839 History   First MD Initiated Contact with Patient 07/02/15 1915     Chief Complaint  Patient presents with  . Chest Pain    x1.5 weeks  . Aphasia    slurred speech x3 days      HPI Pt was seen at 1920. Per pt, c/o gradual onset and persistence of constant mid-sternal chest "pain" for the past 1.5 weeks. Pt also states her family "told me I've been slurring my speech for the past 3 days." Pt denies drug OD. Denies palpitations, no SOB/cough, no abd pain, no N/V/D, no fevers, no focal motor weakness, no tingling/numbness in extremities.     Past Medical History  Diagnosis Date  . COPD (chronic obstructive pulmonary disease) ([place]) 09/14/2010  . 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 H/O MRSA infection 09/14/2010  . Back muscle spasm 12/13/2010  . Psoriasis 12/13/2010  . Asthma   . Hypertension   . High blood cholesterol   . Hyperlipidemia 12/07/2012  . Tobacco abuse 12/07/2012  . Chronic back pain   . Chronic foot pain   . Cancer [facility])     breast cancer, remission 2010   Past Surgical History  Procedure Laterality Date  . Portacath insertion    . Mastectomy      left  . Abdominal hysterectomy    . Cesarean section      x7  . Port-a-cath removal  06/01/2011    Procedure: REMOVAL PORT-A-CATH;  Surgeon: Donato Heinz, MD;  Location: [facility];  Service: General;  Laterality: N/A;  Minor Room  . Mastectomy     Family History  Problem Relation Age of Onset  . Stroke Mother   . Stroke Father   . Cancer Maternal Aunt   . Cancer Maternal Grandmother   . Cancer Maternal Aunt   . Cancer Maternal Aunt    Social History  Substance Use Topics  . Smoking status: Current Every Day Smoker -- 0.25 packs/day for 36 years  . Smokeless tobacco: Never Used  . Alcohol Use: No    Review of Systems  ROS: Statement: All systems negative except as marked or noted in the HPI; Constitutional: Negative for fever and chills. ; ; Eyes:  Negative for eye pain, redness and discharge. ; ; ENMT: Negative for ear pain, hoarseness, nasal congestion, sinus pressure and sore throat. ; ; Cardiovascular: +CP. Negative for palpitations, diaphoresis, dyspnea and peripheral edema. ; ; Respiratory: Negative for cough, wheezing and stridor. ; ; Gastrointestinal: Negative for nausea, vomiting, diarrhea, abdominal pain, blood in stool, hematemesis, jaundice and rectal bleeding. . ; ; Genitourinary: Negative for dysuria, flank pain and hematuria. ; ; Musculoskeletal: Negative for back pain and neck pain. Negative for swelling and trauma.; ; Skin: Negative for pruritus, rash, abrasions, blisters, bruising and skin lesion.; ; Neuro: +slurred speech. Negative for headache, lightheadedness and neck stiffness. Negative for extremity weakness, paresthesias, involuntary movement, seizure and syncope.     Allergies  Codeine and Penicillins  Home Medications   Prior to Admission medications   Medication Sig Start Date End Date Taking? Authorizing Provider  ALPRAZolam Duanne Moron) 1 MG tablet Take 1 tablet (1 mg total) by mouth 2 (two) times daily as needed for anxiety. 05/06/15  Yes Baird Cancer, PA-C  aspirin 81 MG tablet Take 81 mg by mouth daily.   Yes Historical Provider, MD  cyclobenzaprine (FLEXERIL) 10 MG tablet Take 1 tablet (10 mg  total) by mouth 3 (three) times daily. Patient not taking: Reported on 07/02/2015 06/11/15   Lily Kocher, PA-C  diclofenac (VOLTAREN) 75 MG EC tablet Take 1 tablet (75 mg total) by mouth 2 (two) times daily. Patient not taking: Reported on 07/02/2015 06/11/15   Lily Kocher, PA-C  escitalopram (LEXAPRO) 10 MG tablet Take 10 mg by mouth daily.    Historical Provider, MD  hydrochlorothiazide (HYDRODIURIL) 50 MG tablet Take 50 mg by mouth daily.    Historical Provider, MD  HYDROcodone-acetaminophen (NORCO/VICODIN) 5-325 MG tablet Take 2 tablets by mouth every 4 (four) hours as needed. 12/19/14   Tanna Furry, MD  hydrOXYzine  (ATARAX/VISTARIL) 50 MG tablet Take 50 mg by mouth 3 (three) times daily as needed.    Historical Provider, MD  lisinopril (PRINIVIL,ZESTRIL) 40 MG tablet Take 40 mg by mouth daily. Reported on 05/06/2015    Historical Provider, MD  losartan-hydrochlorothiazide (HYZAAR) 100-12.5 MG tablet Take 1 tablet by mouth daily.    Historical Provider, MD  metoprolol succinate (TOPROL-XL) 50 MG 24 hr tablet Take 50 mg by mouth daily. Take with or immediately following a meal.    Historical Provider, MD  montelukast (SINGULAIR) 10 MG tablet Take 10 mg by mouth at bedtime.    Historical Provider, MD  Moxifloxacin HCl (MOXEZA) 0.5 % SOLN Apply to eye.    Historical Provider, MD  mupirocin ointment (BACTROBAN) 2 % Apply 1 application topically 2 (two) times daily. 05/06/15   Baird Cancer, PA-C  oxyCODONE-acetaminophen (PERCOCET) 7.5-325 MG tablet Take 1 tablet by mouth every 6 (six) hours as needed for moderate pain or severe pain. Reported on 05/06/2015 11/28/14   Historical Provider, MD  oxyCODONE-acetaminophen (PERCOCET/ROXICET) 5-325 MG tablet Take 1 tablet by mouth every 8 (eight) hours. 04/27/15   Historical Provider, MD  pravastatin (PRAVACHOL) 20 MG tablet Take 20 mg by mouth daily. Reported on 05/06/2015    Historical Provider, MD   BP 101/63 mmHg  Pulse 50  Temp(Src) 97.9 F (36.6 C) (Oral)  Resp 24  Ht '5\' 4"'$  (1.626 m)  Wt 219 lb (99.338 kg)  BMI 37.57 kg/m2  SpO2 99% Physical Exam  1925: Physical examination:  Nursing notes reviewed; Vital signs and O2 SAT reviewed;  Constitutional: Well developed, Well nourished, Well hydrated, In no acute distress; Head:  Normocephalic, atraumatic; Eyes: EOMI, PERRL, No scleral icterus; ENMT: Mouth and pharynx normal, Mucous membranes moist; Neck: Supple, Full range of motion, No lymphadenopathy; Cardiovascular: Regular rate and rhythm, No gallop; Respiratory: Breath sounds clear & equal bilaterally, No wheezes.  Speaking full sentences with ease, Normal respiratory  effort/excursion; Chest: Nontender, Movement normal; Abdomen: Soft, Nontender, Nondistended, Normal bowel sounds; Genitourinary: No CVA tenderness; Extremities: Pulses normal, No tenderness, No edema, No calf edema or asymmetry.; Neuro: Lethargic, arouses to name but keeps eyes closed, speech slurred. No facial droop. Grips equal. Strength 5/5 equal bilat UE's and LE's. No gross focal motor or sensory deficits in extremities.; Skin: Color normal, Warm, Dry.   ED Course  Procedures (including critical care time) Labs Review  Imaging Review  I have personally reviewed and evaluated these images and lab results as part of my medical decision-making.   EKG Interpretation   Date/Time:  Thursday Jul 02 2015 18:47:33 EDT Ventricular Rate:  53 PR Interval:  180 QRS Duration: 97 QT Interval:  437 QTC Calculation: 410 R Axis:   92 Text Interpretation:  Sinus rhythm Borderline right axis deviation  Probable left ventricular hypertrophy When compared with ECG of  09/24/2014  No significant change was found Confirmed by [facility]  MD, Nunzio Cory [phone])  on 07/02/2015 8:02:32 PM      MDM  MDM Reviewed: previous chart, nursing note and vitals Reviewed previous: labs and ECG Interpretation: labs, ECG, x-ray and CT scan     Results for orders placed or performed during the hospital encounter of 07/02/15  Comprehensive metabolic panel  Result Value Ref Range   Sodium 140 135 - 145 mmol/L   Potassium 4.0 3.5 - 5.1 mmol/L   Chloride 101 101 - 111 mmol/L   CO2 33 (H) 22 - 32 mmol/L   Glucose, Bld 124 (H) 65 - 99 mg/dL   BUN 19 6 - 20 mg/dL   Creatinine, Ser 1.04 (H) 0.44 - 1.00 mg/dL   Calcium 9.5 8.9 - 10.3 mg/dL   Total Protein 7.5 6.5 - 8.1 g/dL   Albumin 3.9 3.5 - 5.0 g/dL   AST 26 15 - 41 U/L   ALT 22 14 - 54 U/L   Alkaline Phosphatase 69 38 - 126 U/L   Total Bilirubin 0.3 0.3 - 1.2 mg/dL   GFR calc non Af Amer >60 >60 mL/min   GFR calc Af Amer >60 >60 mL/min   Anion gap 6 5 - 15   CBC WITH DIFFERENTIAL  Result Value Ref Range   WBC 7.7 4.0 - 10.5 K/uL   RBC 4.46 3.87 - 5.11 MIL/uL   Hemoglobin 14.1 12.0 - 15.0 g/dL   HCT 42.9 36.0 - 46.0 %   MCV 96.2 78.0 - 100.0 fL   MCH 31.6 26.0 - 34.0 pg   MCHC 32.9 30.0 - 36.0 g/dL   RDW 14.4 11.5 - 15.5 %   Platelets 223 150 - 400 K/uL   Neutrophils Relative % 51 %   Neutro Abs 3.9 1.7 - 7.7 K/uL   Lymphocytes Relative 43 %   Lymphs Abs 3.3 0.7 - 4.0 K/uL   Monocytes Relative 5 %   Monocytes Absolute 0.4 0.1 - 1.0 K/uL   Eosinophils Relative 1 %   Eosinophils Absolute 0.1 0.0 - 0.7 K/uL   Basophils Relative 0 %   Basophils Absolute 0.0 0.0 - 0.1 K/uL  Troponin I  Result Value Ref Range   Troponin I 0.04 (H) <0.031 ng/mL   Dg Chest 2 View 07/02/2015  CLINICAL DATA:  55 year old female with chest pain EXAM: CHEST  2 VIEW COMPARISON:  Chest radiograph dated 09/24/2014 FINDINGS: Two views of chest demonstrates clear lungs. There is no pleural effusion or pneumothorax. Top-normal cardiac size similar to prior study. No acute osseous pathology. IMPRESSION: No active cardiopulmonary disease. Electronically Signed   By: Anner Crete M.D.   On: 07/02/2015 19:36    1954:  Pt lethargic with slurred speech; has known opiate use for chronic pain. Small dose of IV narcan given with good effect. Pt now awake/alert and very agitated; immediately began to rip off her BP cuff/cardiac monitor leads and demanded IV be removed. Pt told ED staff she was leaving and then walked out of the ED.    Francine Graven, DO 07/06/15 [phone]

## 2015-08-26 ENCOUNTER — Telehealth (HOSPITAL_COMMUNITY): Payer: Self-pay | Admitting: *Deleted

## 2015-10-27 ENCOUNTER — Encounter (HOSPITAL_COMMUNITY): Payer: Self-pay | Admitting: Emergency Medicine

## 2015-10-27 ENCOUNTER — Emergency Department (HOSPITAL_COMMUNITY)
Admission: EM | Admit: 2015-10-27 | Discharge: 2015-10-27 | Disposition: A | Discharge status: Home / Self Care | Payer: Medicaid Other | Attending: Emergency Medicine | Admitting: Emergency Medicine

## 2015-10-27 ENCOUNTER — Other Ambulatory Visit (HOSPITAL_COMMUNITY): Payer: Self-pay | Admitting: Oncology

## 2015-10-27 DIAGNOSIS — A4902 Methicillin resistant Staphylococcus aureus infection, unspecified site: Secondary | ICD-10-CM

## 2015-10-27 DIAGNOSIS — L02224 Furuncle of groin: Secondary | ICD-10-CM

## 2015-10-27 DIAGNOSIS — J45909 Unspecified asthma, uncomplicated: Secondary | ICD-10-CM | POA: Insufficient documentation

## 2015-10-27 DIAGNOSIS — Z853 Personal history of malignant neoplasm of breast: Secondary | ICD-10-CM | POA: Diagnosis not present

## 2015-10-27 DIAGNOSIS — Z7982 Long term (current) use of aspirin: Secondary | ICD-10-CM | POA: Insufficient documentation

## 2015-10-27 DIAGNOSIS — B850 Pediculosis due to Pediculus humanus capitis: Secondary | ICD-10-CM

## 2015-10-27 DIAGNOSIS — I10 Essential (primary) hypertension: Secondary | ICD-10-CM | POA: Diagnosis not present

## 2015-10-27 DIAGNOSIS — F17200 Nicotine dependence, unspecified, uncomplicated: Secondary | ICD-10-CM | POA: Insufficient documentation

## 2015-10-27 DIAGNOSIS — J449 Chronic obstructive pulmonary disease, unspecified: Secondary | ICD-10-CM | POA: Insufficient documentation

## 2015-10-27 DIAGNOSIS — F411 Generalized anxiety disorder: Secondary | ICD-10-CM

## 2015-10-27 DIAGNOSIS — C50412 Malignant neoplasm of upper-outer quadrant of left female breast: Secondary | ICD-10-CM

## 2015-10-27 MED ORDER — PERMETHRIN 1 % EX LIQD: Freq: Once | CUTANEOUS | 0 refills | Status: AC

## 2015-10-27 NOTE — ED Provider Notes (Signed)
[facility] DEPT Provider Note   CSN: [id number] Arrival date & time: 10/27/15  1937     History   Chief Complaint Chief Complaint  Patient presents with  . Head Lice    HPI [name] is a 55 y.o. female who presents to the ED with her 4 grand children for head lice. She reports that the children's mother dropped them off at her house and they all had lice and now she also has lice.   The history is provided by the patient. No language interpreter was used.    Past Medical History:  Diagnosis Date  . Asthma   . Back muscle spasm 12/13/2010  . Cancer [facility])    breast cancer, remission 2010  . Chronic back pain   . Chronic foot pain   . COPD (chronic obstructive pulmonary disease) ([place]) 09/14/2010  . H/O MRSA infection 09/14/2010  . High blood cholesterol   . Hyperlipidemia 12/07/2012  . Hypertension   . 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 Psoriasis 12/13/2010  . Tobacco abuse 12/07/2012    Patient Active Problem List   Diagnosis Date Noted  . Eye drainage 02/28/2014  . Conjunctivitis 02/28/2014  . Back pain 02/28/2014  . Hyperlipidemia 12/07/2012  . Tobacco abuse 12/07/2012  . Psoriasis 12/13/2010  . COPD (chronic obstructive pulmonary disease) ([place]) 09/14/2010  . 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 H/O MRSA infection 09/14/2010  . Anxiety state 05/13/2009  . DEPRESSION 05/13/2009  . HYPERTENSION 05/13/2009  . H/O Stage III Breast cancer, left 05/13/2009    Past Surgical History:  Procedure Laterality Date  . ABDOMINAL HYSTERECTOMY    . CESAREAN SECTION     x7  . MASTECTOMY     left  . MASTECTOMY    . PORT-A-CATH REMOVAL  06/01/2011   Procedure: REMOVAL PORT-A-CATH;  Surgeon: Donato Heinz, MD;  Location: [facility];  Service: General;  Laterality: N/A;  Minor Room  . portacath insertion      OB History    No data available       Home Medications    Prior to Admission medications   Medication Sig Start Date End Date  Taking? Authorizing Provider  ALPRAZolam Duanne Moron) 1 MG tablet TAKE ONE TABLET BY MOUTH TWICE DAILY AS NEEDED. 10/29/15   Baird Cancer, PA-C  aspirin 81 MG tablet Take 81 mg by mouth daily.    Historical Provider, MD  cyclobenzaprine (FLEXERIL) 10 MG tablet Take 1 tablet (10 mg total) by mouth 3 (three) times daily. Patient not taking: Reported on 07/02/2015 06/11/15   Lily Kocher, PA-C  diclofenac (VOLTAREN) 75 MG EC tablet Take 1 tablet (75 mg total) by mouth 2 (two) times daily. Patient not taking: Reported on 07/02/2015 06/11/15   Lily Kocher, PA-C  escitalopram (LEXAPRO) 10 MG tablet Take 10 mg by mouth daily.    Historical Provider, MD  hydrochlorothiazide (HYDRODIURIL) 50 MG tablet Take 50 mg by mouth daily.    Historical Provider, MD  HYDROcodone-acetaminophen (NORCO/VICODIN) 5-325 MG tablet Take 2 tablets by mouth every 4 (four) hours as needed. 12/19/14   Tanna Furry, MD  hydrOXYzine (ATARAX/VISTARIL) 50 MG tablet Take 50 mg by mouth 3 (three) times daily as needed.    Historical Provider, MD  lisinopril (PRINIVIL,ZESTRIL) 40 MG tablet Take 40 mg by mouth daily. Reported on 05/06/2015    Historical Provider, MD  losartan-hydrochlorothiazide (HYZAAR) 100-12.5 MG tablet Take 1 tablet by mouth daily.    Historical Provider,  MD  metoprolol succinate (TOPROL-XL) 50 MG 24 hr tablet Take 50 mg by mouth daily. Take with or immediately following a meal.    Historical Provider, MD  montelukast (SINGULAIR) 10 MG tablet Take 10 mg by mouth at bedtime.    Historical Provider, MD  Moxifloxacin HCl (MOXEZA) 0.5 % SOLN Apply to eye.    Historical Provider, MD  mupirocin ointment (BACTROBAN) 2 % Apply 1 application topically 2 (two) times daily. 05/06/15   Baird Cancer, PA-C  oxyCODONE-acetaminophen (PERCOCET) 7.5-325 MG tablet Take 1 tablet by mouth every 6 (six) hours as needed for moderate pain or severe pain. Reported on 05/06/2015 11/28/14   Historical Provider, MD  oxyCODONE-acetaminophen  (PERCOCET/ROXICET) 5-325 MG tablet Take 1 tablet by mouth every 8 (eight) hours. 04/27/15   Historical Provider, MD  pravastatin (PRAVACHOL) 20 MG tablet Take 20 mg by mouth daily. Reported on 05/06/2015    Historical Provider, MD    Family History Family History  Problem Relation Age of Onset  . Stroke Mother   . Stroke Father   . Cancer Maternal Aunt   . Cancer Maternal Grandmother   . Cancer Maternal Aunt   . Cancer Maternal Aunt     Social History Social History  Substance Use Topics  . Smoking status: Current Every Day Smoker    Packs/day: 0.25    Years: 36.00  . Smokeless tobacco: Never Used  . Alcohol use No     Allergies   Codeine and Penicillins   Review of Systems Review of Systems  HENT:       Head lice     Physical Exam Updated Vital Signs BP 147/88   Pulse 84   Temp 99.1 F (37.3 C)   Resp 18   SpO2 96%   Physical Exam  Constitutional: She is oriented to person, place, and time. She appears well-developed and well-nourished.  HENT:  Head lice  Eyes: EOM are normal.  Neck: Neck supple.  Pulmonary/Chest: Effort normal.  Musculoskeletal: Normal range of motion.  Neurological: She is alert and oriented to person, place, and time. No cranial nerve deficit.  Skin: Skin is warm and dry.  Psychiatric: She has a normal mood and affect. Her behavior is normal.  Nursing note and vitals reviewed.    ED Treatments / Results  Labs (all labs ordered are listed, but only abnormal results are displayed) Labs Reviewed - No data to display  Radiology No results found.  Procedures Procedures (including critical care time)  Medications Ordered in ED Medications - No data to display   Initial Impression / Assessment and Plan / ED Course  I have reviewed the triage vital signs and the nursing notes. Clinical Course    Final Clinical Impressions(s) / ED Diagnoses  55 y.o. female with head lice stable for d/c without other problems  Final diagnoses:   Head lice    New Prescriptions Discharge Medication List as of 10/27/2015  8:24 PM    START taking these medications   Details  permethrin (NIX CREME RINSE) 1 % liquid Apply topically once., Starting Tue 10/27/2015, [place], NP 10/30/15 0158    Nat Christen, MD 10/30/15 2242

## 2015-10-27 NOTE — ED Notes (Signed)
Pt states she was told by her social worker to come the ER for lice treatment.

## 2015-10-27 NOTE — ED Triage Notes (Signed)
Pt c/o head lice

## 2015-10-27 NOTE — ED Notes (Signed)
Pt alert & oriented x4, stable gait. Patient given discharge instructions, paperwork & prescription(s). Patient  instructed to stop at the registration desk to finish any additional paperwork. Patient verbalized understanding. Pt left department w/ no further questions. 

## 2015-10-28 ENCOUNTER — Other Ambulatory Visit (HOSPITAL_COMMUNITY): Payer: Self-pay | Admitting: Oncology

## 2015-10-28 DIAGNOSIS — A4902 Methicillin resistant Staphylococcus aureus infection, unspecified site: Secondary | ICD-10-CM

## 2015-10-28 DIAGNOSIS — C50412 Malignant neoplasm of upper-outer quadrant of left female breast: Secondary | ICD-10-CM

## 2015-10-28 DIAGNOSIS — F411 Generalized anxiety disorder: Secondary | ICD-10-CM

## 2015-10-28 DIAGNOSIS — L02224 Furuncle of groin: Secondary | ICD-10-CM

## 2015-10-30 ENCOUNTER — Other Ambulatory Visit (HOSPITAL_COMMUNITY): Payer: Self-pay | Admitting: Oncology

## 2015-10-30 DIAGNOSIS — A4902 Methicillin resistant Staphylococcus aureus infection, unspecified site: Secondary | ICD-10-CM

## 2015-10-30 DIAGNOSIS — C50412 Malignant neoplasm of upper-outer quadrant of left female breast: Secondary | ICD-10-CM

## 2015-10-30 DIAGNOSIS — F411 Generalized anxiety disorder: Secondary | ICD-10-CM

## 2015-10-30 DIAGNOSIS — L02224 Furuncle of groin: Secondary | ICD-10-CM

## 2015-11-03 ENCOUNTER — Other Ambulatory Visit (HOSPITAL_COMMUNITY): Payer: Self-pay | Admitting: Oncology

## 2015-11-03 DIAGNOSIS — A4902 Methicillin resistant Staphylococcus aureus infection, unspecified site: Secondary | ICD-10-CM

## 2015-11-03 DIAGNOSIS — F411 Generalized anxiety disorder: Secondary | ICD-10-CM

## 2015-11-03 DIAGNOSIS — L02224 Furuncle of groin: Secondary | ICD-10-CM

## 2015-11-03 DIAGNOSIS — C50412 Malignant neoplasm of upper-outer quadrant of left female breast: Secondary | ICD-10-CM

## 2016-01-22 ENCOUNTER — Other Ambulatory Visit (HOSPITAL_COMMUNITY): Payer: Self-pay | Admitting: Oncology

## 2016-01-22 DIAGNOSIS — C50412 Malignant neoplasm of upper-outer quadrant of left female breast: Secondary | ICD-10-CM

## 2016-01-22 DIAGNOSIS — L02224 Furuncle of groin: Secondary | ICD-10-CM

## 2016-01-22 DIAGNOSIS — F411 Generalized anxiety disorder: Secondary | ICD-10-CM

## 2016-01-22 DIAGNOSIS — A4902 Methicillin resistant Staphylococcus aureus infection, unspecified site: Secondary | ICD-10-CM

## 2016-02-03 ENCOUNTER — Ambulatory Visit (HOSPITAL_COMMUNITY): Payer: Medicaid Other | Admitting: Oncology

## 2016-03-10 ENCOUNTER — Emergency Department (HOSPITAL_COMMUNITY): Payer: Medicaid Other

## 2016-03-10 ENCOUNTER — Encounter (HOSPITAL_COMMUNITY): Payer: Self-pay

## 2016-03-10 ENCOUNTER — Emergency Department (HOSPITAL_COMMUNITY)
Admission: EM | Admit: 2016-03-10 | Discharge: 2016-03-11 | Disposition: A | Discharge status: Home / Self Care | Payer: Medicaid Other | Attending: Emergency Medicine | Admitting: Emergency Medicine

## 2016-03-10 DIAGNOSIS — Z79899 Other long term (current) drug therapy: Secondary | ICD-10-CM | POA: Insufficient documentation

## 2016-03-10 DIAGNOSIS — I10 Essential (primary) hypertension: Secondary | ICD-10-CM | POA: Diagnosis not present

## 2016-03-10 DIAGNOSIS — R4182 Altered mental status, unspecified: Secondary | ICD-10-CM | POA: Insufficient documentation

## 2016-03-10 DIAGNOSIS — Z7982 Long term (current) use of aspirin: Secondary | ICD-10-CM | POA: Diagnosis not present

## 2016-03-10 DIAGNOSIS — Z853 Personal history of malignant neoplasm of breast: Secondary | ICD-10-CM | POA: Diagnosis not present

## 2016-03-10 DIAGNOSIS — J449 Chronic obstructive pulmonary disease, unspecified: Secondary | ICD-10-CM | POA: Diagnosis not present

## 2016-03-10 DIAGNOSIS — T402X4D Poisoning by other opioids, undetermined, subsequent encounter: Secondary | ICD-10-CM

## 2016-03-10 DIAGNOSIS — F17200 Nicotine dependence, unspecified, uncomplicated: Secondary | ICD-10-CM | POA: Insufficient documentation

## 2016-03-10 DIAGNOSIS — T424X4D Poisoning by benzodiazepines, undetermined, subsequent encounter: Secondary | ICD-10-CM | POA: Diagnosis not present

## 2016-03-10 LAB — CBC
HCT: 43.7 % (ref 36.0–46.0)
HEMOGLOBIN: 14 g/dL (ref 12.0–15.0)
MCH: 30.3 pg (ref 26.0–34.0)
MCHC: 32 g/dL (ref 30.0–36.0)
MCV: 94.6 fL (ref 78.0–100.0)
Platelets: 212 10*3/uL (ref 150–400)
RBC: 4.62 MIL/uL (ref 3.87–5.11)
RDW: 15.9 % — ABNORMAL HIGH (ref 11.5–15.5)
WBC: 9 10*3/uL (ref 4.0–10.5)

## 2016-03-10 LAB — URINALYSIS, ROUTINE W REFLEX MICROSCOPIC
Bilirubin Urine: NEGATIVE
Glucose, UA: NEGATIVE mg/dL
Ketones, ur: NEGATIVE mg/dL
Leukocytes, UA: NEGATIVE
Nitrite: NEGATIVE
PH: 5 (ref 5.0–8.0)
Protein, ur: NEGATIVE mg/dL
SPECIFIC GRAVITY, URINE: 1.025 (ref 1.005–1.030)

## 2016-03-10 LAB — ETHANOL

## 2016-03-10 LAB — COMPREHENSIVE METABOLIC PANEL
ALK PHOS: 75 U/L (ref 38–126)
ALT: 12 U/L — ABNORMAL LOW (ref 14–54)
ANION GAP: 8 (ref 5–15)
AST: 17 U/L (ref 15–41)
Albumin: 3.8 g/dL (ref 3.5–5.0)
BILIRUBIN TOTAL: 0.6 mg/dL (ref 0.3–1.2)
BUN: 15 mg/dL (ref 6–20)
CALCIUM: 9.5 mg/dL (ref 8.9–10.3)
CO2: 27 mmol/L (ref 22–32)
Chloride: 105 mmol/L (ref 101–111)
Creatinine, Ser: 0.92 mg/dL (ref 0.44–1.00)
GFR calc non Af Amer: >60 mL/min (ref >60)
Glucose, Bld: 131 mg/dL — ABNORMAL HIGH (ref 65–99)
Potassium: 4.3 mmol/L (ref 3.5–5.1)
Sodium: 140 mmol/L (ref 135–145)
Total Protein: 6.8 g/dL (ref 6.5–8.1)

## 2016-03-10 LAB — ACETAMINOPHEN LEVEL: Acetaminophen (Tylenol), Serum: <10 ug/mL — ABNORMAL LOW (ref 10–30)

## 2016-03-10 LAB — RAPID URINE DRUG SCREEN, HOSP PERFORMED
Amphetamines: NOT DETECTED
BARBITURATES: NOT DETECTED
Benzodiazepines: POSITIVE — AB
Cocaine: NOT DETECTED
Opiates: POSITIVE — AB
Tetrahydrocannabinol: NOT DETECTED

## 2016-03-10 LAB — CBG MONITORING, ED
GLUCOSE-CAPILLARY: 131 mg/dL — AB (ref 65–99)
GLUCOSE-CAPILLARY: 126 mg/dL — AB (ref 65–99)

## 2016-03-10 LAB — SALICYLATE LEVEL

## 2016-03-10 MED ORDER — NALOXONE HCL 0.4 MG/ML IJ SOLN
0.4000 mg | Freq: Once | INTRAMUSCULAR | Status: AC
  Administered 2016-03-10: 0.4 mg via INTRAVENOUS
  Filled 2016-03-10: qty 1

## 2016-03-10 MED ORDER — NALOXONE HCL 4 MG/0.1ML NA LIQD: 0.4000 mg | Freq: Once | NASAL | Status: DC

## 2016-03-10 NOTE — ED Notes (Signed)
Pt kept her cellphone by her side. Belongings placed in white belongings bag and placed on chair.

## 2016-03-10 NOTE — ED Triage Notes (Signed)
[facility]_ pt brought in for abd pain and is reportedly a&o X4. When speaking to patient she denies any abd pain and is oriented to self and place only. She is unable to tell me the year as well as her age. Pt's family member called EMS reporting patient had taken several xanax today.

## 2016-03-10 NOTE — ED Notes (Signed)
Pt is lethargic and not answering questions appropriately. Pt denies any drug usage. Pt fell asleep while this RN and the PA student were doing their respective assessments.

## 2016-03-10 NOTE — ED Notes (Signed)
In and Out cath NOT performed. Accidentally clicked off the order. Pt was able to give a urine sample without cathing her.

## 2016-03-10 NOTE — ED Provider Notes (Signed)
[facility] DEPT Provider Note   CSN: [id number] Arrival date & time: 03/10/16  1702     History   Chief Complaint Chief Complaint  Patient presents with  . Altered Mental Status    HPI  BRITANEY ESPAILLAT is a 56 y.o. female with a hx of asthma, breast cancer (2010 remission), COPD, chronic pain, HTN, polysubstance abuse, presents to the Emergency Department via EMS.  Pt initially c/o abd pain oriented to person and place only.  Per EMS, family called because she had taken "a bunch of xanax."  Pt at this time unable to orient to place or time.  She doesn't know what she is here.   Level 5 caveat for AMS.      Record Review shows that patient is followed by the [facility] for surveillance of her previous breast cancer. They prescribed Xanax twice a day 1 mg every month. Record review also shows that patient was prescribed oxycodone 18 mg tablet #150 on 03/07/2016.  Patient also has a history of narcotic overdose.   The history is provided by the patient, the EMS personnel and medical records. No language interpreter was used.    Past Medical History:  Diagnosis Date  . Asthma   . Back muscle spasm 12/13/2010  . Cancer [facility])    breast cancer, remission 2010  . Chronic back pain   . Chronic foot pain   . COPD (chronic obstructive pulmonary disease) ([place]) 09/14/2010  . H/O MRSA infection 09/14/2010  . High blood cholesterol   . Hyperlipidemia 12/07/2012  . Hypertension   . 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 Psoriasis 12/13/2010  . Tobacco abuse 12/07/2012    Patient Active Problem List   Diagnosis Date Noted  . Eye drainage 02/28/2014  . Conjunctivitis 02/28/2014  . Back pain 02/28/2014  . Hyperlipidemia 12/07/2012  . Tobacco abuse 12/07/2012  . Psoriasis 12/13/2010  . COPD (chronic obstructive pulmonary disease) ([place]) 09/14/2010  . 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 H/O MRSA infection 09/14/2010  . Anxiety state 05/13/2009  . DEPRESSION  05/13/2009  . HYPERTENSION 05/13/2009  . H/O Stage III Breast cancer, left 05/13/2009    Past Surgical History:  Procedure Laterality Date  . ABDOMINAL HYSTERECTOMY    . CESAREAN SECTION     x7  . MASTECTOMY     left  . MASTECTOMY    . PORT-A-CATH REMOVAL  06/01/2011   Procedure: REMOVAL PORT-A-CATH;  Surgeon: Donato Heinz, MD;  Location: [facility];  Service: General;  Laterality: N/A;  Minor Room  . portacath insertion      OB History    No data available       Home Medications    Prior to Admission medications   Medication Sig Start Date End Date Taking? Authorizing Provider  ALPRAZolam (XANAX) 1 MG tablet TAKE ONE TABLET BY MOUTH TWICE DAILY AS NEEDED FOR ANXIETY. 01/22/16   Baird Cancer, PA-C  aspirin 81 MG tablet Take 81 mg by mouth daily.    Historical Provider, MD  cyclobenzaprine (FLEXERIL) 10 MG tablet Take 1 tablet (10 mg total) by mouth 3 (three) times daily. Patient not taking: Reported on 07/02/2015 06/11/15   Lily Kocher, PA-C  diclofenac (VOLTAREN) 75 MG EC tablet Take 1 tablet (75 mg total) by mouth 2 (two) times daily. Patient not taking: Reported on 07/02/2015 06/11/15   Lily Kocher, PA-C  escitalopram (LEXAPRO) 10 MG tablet Take 10 mg by mouth daily.  Historical Provider, MD  hydrochlorothiazide (HYDRODIURIL) 50 MG tablet Take 50 mg by mouth daily.    Historical Provider, MD  HYDROcodone-acetaminophen (NORCO/VICODIN) 5-325 MG tablet Take 2 tablets by mouth every 4 (four) hours as needed. 12/19/14   Tanna Furry, MD  hydrOXYzine (ATARAX/VISTARIL) 50 MG tablet Take 50 mg by mouth 3 (three) times daily as needed.    Historical Provider, MD  lisinopril (PRINIVIL,ZESTRIL) 40 MG tablet Take 40 mg by mouth daily. Reported on 05/06/2015    Historical Provider, MD  losartan-hydrochlorothiazide (HYZAAR) 100-12.5 MG tablet Take 1 tablet by mouth daily.    Historical Provider, MD  metoprolol succinate (TOPROL-XL) 50 MG 24 hr tablet Take 50 mg by mouth daily. Take with  or immediately following a meal.    Historical Provider, MD  montelukast (SINGULAIR) 10 MG tablet Take 10 mg by mouth at bedtime.    Historical Provider, MD  Moxifloxacin HCl (MOXEZA) 0.5 % SOLN Apply to eye.    Historical Provider, MD  mupirocin ointment (BACTROBAN) 2 % Apply 1 application topically 2 (two) times daily. 05/06/15   Baird Cancer, PA-C  oxyCODONE-acetaminophen (PERCOCET) 7.5-325 MG tablet Take 1 tablet by mouth every 6 (six) hours as needed for moderate pain or severe pain. Reported on 05/06/2015 11/28/14   Historical Provider, MD  oxyCODONE-acetaminophen (PERCOCET/ROXICET) 5-325 MG tablet Take 1 tablet by mouth every 8 (eight) hours. 04/27/15   Historical Provider, MD  pravastatin (PRAVACHOL) 20 MG tablet Take 20 mg by mouth daily. Reported on 05/06/2015    Historical Provider, MD    Family History Family History  Problem Relation Age of Onset  . Stroke Mother   . Stroke Father   . Cancer Maternal Aunt   . Cancer Maternal Grandmother   . Cancer Maternal Aunt   . Cancer Maternal Aunt     Social History Social History  Substance Use Topics  . Smoking status: Current Every Day Smoker    Packs/day: 0.25    Years: 36.00  . Smokeless tobacco: Never Used  . Alcohol use No     Allergies   Codeine and Penicillins   Review of Systems Review of Systems  Unable to perform ROS: Mental status change     Physical Exam Updated Vital Signs BP 141/80 (BP Location: Right Arm)   Pulse 85   Temp 99.4 F (37.4 C) (Oral)   Resp 15   SpO2 95%   Physical Exam  Constitutional: She appears well-developed and well-nourished. She appears lethargic. No distress.  Awake, alert, nontoxic appearance  HENT:  Head: Normocephalic and atraumatic.  Mouth/Throat: Mucous membranes are dry. No oropharyngeal exudate.  Eyes: Conjunctivae and EOM are normal. Pupils are equal, round, and reactive to light. No scleral icterus.  Neck: Normal range of motion. Neck supple.  No midline  tenderness  Cardiovascular: Normal rate, regular rhythm and intact distal pulses.   Pulmonary/Chest: Effort normal and breath sounds normal. No respiratory distress. She has no wheezes.  Equal chest expansion  Abdominal: Soft. Bowel sounds are normal. She exhibits no mass. There is no tenderness. There is no rebound and no guarding.  Musculoskeletal: Normal range of motion. She exhibits no edema.  Neurological: She appears lethargic. GCS eye subscore is 3. GCS verbal subscore is 4. GCS motor subscore is 6.  Mental Status:  Patient is very lethargic, slurring her words.  Able to follow 1 step commands without difficulty.  Cranial Nerves:  II:  Peripheral visual fields grossly normal, pupils equal, round, reactive to light  III,IV, VI: Ptosis not present, extra-ocular motions intact bilaterally  V,VII: smile symmetric, facial light touch sensation equal VIII: hearing grossly normal to voice  X: uvula elevates symmetrically  XI: bilateral shoulder shrug symmetric and strong XII: midline tongue extension without fassiculations Motor:  Normal tone. 5/5 in upper and lower extremities bilaterally including strong and equal grip strength. Sensation: light touch normal in all extremities.  Cerebellar: normal finger-to-nose with bilateral upper extremities Gait: gait testing deffered CV: distal pulses palpable throughout   Skin: Skin is warm and dry. She is not diaphoretic.  Psychiatric: She has a normal mood and affect.  Nursing note and vitals reviewed.    ED Treatments / Results  Labs (all labs ordered are listed, but only abnormal results are displayed) Labs Reviewed  COMPREHENSIVE METABOLIC PANEL - Abnormal; Notable for the following:       Result Value   Glucose, Bld 131 (*)    ALT 12 (*)    All other components within normal limits  CBC - Abnormal; Notable for the following:    RDW 15.9 (*)    All other components within normal limits  ACETAMINOPHEN LEVEL - Abnormal; Notable for  the following:    Acetaminophen (Tylenol), Serum <10 (*)    All other components within normal limits  RAPID URINE DRUG SCREEN, HOSP PERFORMED - Abnormal; Notable for the following:    Opiates POSITIVE (*)    Benzodiazepines POSITIVE (*)    All other components within normal limits  URINALYSIS, ROUTINE W REFLEX MICROSCOPIC - Abnormal; Notable for the following:    APPearance HAZY (*)    Hgb urine dipstick MODERATE (*)    Bacteria, UA MANY (*)    Squamous Epithelial / LPF 0-5 (*)    All other components within normal limits  CBG MONITORING, ED - Abnormal; Notable for the following:    Glucose-Capillary 131 (*)    All other components within normal limits  CBG MONITORING, ED - Abnormal; Notable for the following:    Glucose-Capillary 126 (*)    All other components within normal limits  ETHANOL  SALICYLATE LEVEL    EKG  EKG Interpretation  Date/Time:  Thursday March 10 2016 21:29:28 EST Ventricular Rate:  79 PR Interval:    QRS Duration: 92 QT Interval:  380 QTC Calculation: 436 R Axis:   96 Text Interpretation:  Sinus rhythm Borderline right axis deviation Consider left ventricular hypertrophy Confirmed by Alvino Chapel  MD, NATHAN [phone]) on 03/10/2016 9:50:13 PM       Radiology Ct Head Wo Contrast  Result Date: 03/10/2016 CLINICAL DATA:  Altered mental status. EXAM: CT HEAD WITHOUT CONTRAST TECHNIQUE: Contiguous axial images were obtained from the base of the skull through the vertex without intravenous contrast. COMPARISON:  Head CT dated 10/05/2012. FINDINGS: Brain: There is mild generalized parenchymal atrophy with commensurate dilatation of the ventricles and sulci. There is no mass, hemorrhage, edema or other evidence of acute parenchymal abnormality. No extra-axial hemorrhage. Vascular: There are chronic calcified atherosclerotic changes of the large vessels at the skull base. No unexpected hyperdense vessel. Skull: Normal. Negative for fracture or focal lesion.  Sinuses/Orbits: No acute finding. Other: None. IMPRESSION: Negative head CT.  No intracranial mass, hemorrhage or edema. Electronically Signed   By: Franki Cabot M.D.   On: 03/10/2016 23:10    Procedures Procedures (including critical care time)  Medications Ordered in ED Medications  naloxone [facility]) injection 0.4 mg (0.4 mg Intravenous Given 03/10/16 2346)     Initial Impression /  Assessment and Plan / ED Course  I have reviewed the triage vital signs and the nursing notes.  Pertinent labs & imaging results that were available during my care of the patient were reviewed by me and considered in my medical decision making (see chart for details).  Clinical Course as of Mar 12 155  Thu Mar 10, 2016  2125 Attempted to call pt's daughter Manus Gunning at [phone].  I was unable to reach her, but did leave a voicemail.  [HM]  2126 Attempted to call Pt's son Nicole Kindred at [phone], but this appears to be a correctional facility and I was unable to speak to [place].    [HM]  Fri Mar 11, 2016  0015 Patient with significant improvement in mental status after Narcan administration. She is now walking. She is alert to person place and time. Oxygen saturations have remained above 93% without assistance.  [HM]    Clinical Course User Index [HM] Jarrett Soho Autumn Pruitt, PA-C    Labs are generally reassuring. No evidence of ethanol salicylate overdose. Patient with low-grade fever upon arrival but normal rectal temperature. Vital signs of an stable throughout her time in the department. She initially had tachycardia at triage but this resolved prior to my examination and she has had no persistent tachycardia.  CT scan head normal. Patient with history of previous opiate overdose and UDS positive for opiates today. Narcan given and within 5 minutes patient became alert and oriented. She has been alert and oriented for the last several hours, ambulatory without difficulty. She is eating and drinking. She wishes for  discharge home. She continues to deny taking opiates or Xanax today.  Recommended that she not continue to utilize these medications.  Patient with low-grade fever on arrival but this resolved and has not returned. Patient initially denied suicidal or homicidal ideation initially and continues to deny this. She is adamant that she did not take opiates and that she did not do this in an effort to hurt herself. No evidence of infection on her lab work or physical exam.  Patient will be discharged home with close PCP follow-up. Discussed reasons to return to the emergency department.  Pt states understanding.    Final Clinical Impressions(s) / ED Diagnoses   Final diagnoses:  Altered mental status, unspecified altered mental status type  Opioid overdose, undetermined intent, subsequent encounter    New Prescriptions New Prescriptions   No medications on file     Abigail Butts, PA-C 03/11/16 0157    Davonna Belling, MD 03/11/16 [phone]

## 2016-03-10 NOTE — ED Notes (Signed)
Pt fell asleep and oxygen saturation dropped to 88% on room air. Placed pt on 2 liters of oxygen via nasal cannula.

## 2016-03-10 NOTE — ED Triage Notes (Signed)
Pt sts she wants to leave but is confused to place and time; pt redirected and to find a bed for her

## 2016-03-11 NOTE — Discharge Instructions (Signed)
1. Medications: usual home medications - do not take any additional narcotics or Xanax 2. Treatment: rest, drink plenty of fluids,  3. Follow Up: Please followup with your primary doctor in 1 day for discussion of your diagnoses and further evaluation after today's visit; if you do not have a primary care doctor use the resource guide provided to find one; Please return to the ER for return of symptoms or other concerns.

## 2016-03-11 NOTE — ED Notes (Signed)
Pt now able to state correct age.  AO x 4.  Spoke with 55 yo grandson who stated he had stayed up all night b/c he had been worried about her, but now that she was coming home in a taxi he was going to sleep.  I asked if there were any other adults in the home and he stated his grandfather was there, but he had gone to work. He also asked if I would verify that she had her cell phone so that she did not accuse him of stealing it.  I did verify with her that she had it.  I asked if anyone was hurting the child and he stated no.  Child protective services called and stated they would look into the case.

## 2016-03-11 NOTE — ED Notes (Signed)
Pt removed all equipment and was refusing additional vital signs.

## 2016-04-08 ENCOUNTER — Other Ambulatory Visit (HOSPITAL_COMMUNITY): Payer: Self-pay | Admitting: Oncology

## 2016-04-08 DIAGNOSIS — F411 Generalized anxiety disorder: Secondary | ICD-10-CM

## 2016-04-08 DIAGNOSIS — C50412 Malignant neoplasm of upper-outer quadrant of left female breast: Secondary | ICD-10-CM

## 2016-04-08 DIAGNOSIS — A4902 Methicillin resistant Staphylococcus aureus infection, unspecified site: Secondary | ICD-10-CM

## 2016-04-08 DIAGNOSIS — L02224 Furuncle of groin: Secondary | ICD-10-CM

## 2016-04-08 MED ORDER — ALPRAZOLAM 1 MG PO TABS: 1.0000 mg | ORAL_TABLET | Freq: Two times a day (BID) | ORAL | 5 refills | Status: DC | PRN

## 2016-04-11 ENCOUNTER — Other Ambulatory Visit (HOSPITAL_COMMUNITY): Payer: Self-pay | Admitting: *Deleted

## 2016-04-11 DIAGNOSIS — A4902 Methicillin resistant Staphylococcus aureus infection, unspecified site: Secondary | ICD-10-CM

## 2016-04-11 DIAGNOSIS — L02224 Furuncle of groin: Secondary | ICD-10-CM

## 2016-04-11 DIAGNOSIS — F411 Generalized anxiety disorder: Secondary | ICD-10-CM

## 2016-04-11 MED ORDER — ALPRAZOLAM 1 MG PO TABS: 1.0000 mg | ORAL_TABLET | Freq: Two times a day (BID) | ORAL | 5 refills | Status: DC | PRN

## 2016-05-10 ENCOUNTER — Ambulatory Visit (HOSPITAL_COMMUNITY): Payer: Medicaid Other | Admitting: Oncology

## 2016-05-10 ENCOUNTER — Other Ambulatory Visit (HOSPITAL_COMMUNITY): Payer: Medicaid Other

## 2016-08-04 ENCOUNTER — Emergency Department (HOSPITAL_COMMUNITY)
Admission: EM | Admit: 2016-08-04 | Discharge: 2016-08-04 | Disposition: A | Discharge status: Home / Self Care | Payer: Medicaid Other | Attending: Emergency Medicine | Admitting: Emergency Medicine

## 2016-08-04 ENCOUNTER — Encounter (HOSPITAL_COMMUNITY): Payer: Self-pay | Admitting: Emergency Medicine

## 2016-08-04 DIAGNOSIS — R4182 Altered mental status, unspecified: Secondary | ICD-10-CM | POA: Insufficient documentation

## 2016-08-04 DIAGNOSIS — Z5321 Procedure and treatment not carried out due to patient leaving prior to being seen by health care provider: Secondary | ICD-10-CM | POA: Diagnosis not present

## 2016-08-04 NOTE — ED Notes (Addendum)
Did not see patient leave, was told patient walked out of department. PA informed.

## 2016-08-04 NOTE — ED Triage Notes (Signed)
Patient here from urgent care reports that she was there to get a refill on all her medications. Staff reports that patient kept falling asleep and couldn't stay awake. Patient on "a lot" of opiates. Currently requesting food. Ambulatory to bedside.

## 2016-08-04 NOTE — ED Provider Notes (Signed)
[name] is a 56 y.o. female with a PMHx of remote breast CA, chronic back pain, COPD, HTN, HLD, and narcotic use with hx of overdose, presented to the ED via EMS reportedly for increased sleepiness at urgent care. At 1:40 PM when I was going in to examine the pt, there was cardiac leads on the bed, a patient gown on the bed, and no personal belongings in the room. Per nursing staff, she reportedly got upset because she wanted food, and apparently left the department. Does not appear to be in the department; after searching for her for about 27mins unable to find pt. It appears she has eloped after triage, without being seen by a provider.   [address], [place], [place] 08/04/16 1432    Long, W[facility], MD 08/04/16 1946

## 2016-08-04 NOTE — ED Notes (Signed)
Bed: JQ96 Expected date:  Expected time:  Means of arrival:  Comments: 56 yo AMS

## 2016-08-08 ENCOUNTER — Emergency Department (HOSPITAL_COMMUNITY)
Admission: EM | Admit: 2016-08-08 | Discharge: 2016-08-08 | Disposition: A | Discharge status: Home / Self Care | Payer: Medicaid Other | Attending: Emergency Medicine | Admitting: Emergency Medicine

## 2016-08-08 ENCOUNTER — Emergency Department (HOSPITAL_COMMUNITY): Payer: Medicaid Other

## 2016-08-08 ENCOUNTER — Encounter (HOSPITAL_COMMUNITY): Payer: Self-pay

## 2016-08-08 DIAGNOSIS — Z7984 Long term (current) use of oral hypoglycemic drugs: Secondary | ICD-10-CM | POA: Insufficient documentation

## 2016-08-08 DIAGNOSIS — Y939 Activity, unspecified: Secondary | ICD-10-CM | POA: Diagnosis not present

## 2016-08-08 DIAGNOSIS — X58XXXA Exposure to other specified factors, initial encounter: Secondary | ICD-10-CM | POA: Insufficient documentation

## 2016-08-08 DIAGNOSIS — Z7982 Long term (current) use of aspirin: Secondary | ICD-10-CM | POA: Diagnosis not present

## 2016-08-08 DIAGNOSIS — S52045A Nondisplaced fracture of coronoid process of left ulna, initial encounter for closed fracture: Secondary | ICD-10-CM | POA: Diagnosis not present

## 2016-08-08 DIAGNOSIS — F17200 Nicotine dependence, unspecified, uncomplicated: Secondary | ICD-10-CM | POA: Diagnosis not present

## 2016-08-08 DIAGNOSIS — I10 Essential (primary) hypertension: Secondary | ICD-10-CM | POA: Diagnosis not present

## 2016-08-08 DIAGNOSIS — Z79899 Other long term (current) drug therapy: Secondary | ICD-10-CM | POA: Diagnosis not present

## 2016-08-08 DIAGNOSIS — J449 Chronic obstructive pulmonary disease, unspecified: Secondary | ICD-10-CM | POA: Insufficient documentation

## 2016-08-08 DIAGNOSIS — Z853 Personal history of malignant neoplasm of breast: Secondary | ICD-10-CM | POA: Insufficient documentation

## 2016-08-08 DIAGNOSIS — Y929 Unspecified place or not applicable: Secondary | ICD-10-CM | POA: Insufficient documentation

## 2016-08-08 DIAGNOSIS — Y999 Unspecified external cause status: Secondary | ICD-10-CM | POA: Insufficient documentation

## 2016-08-08 DIAGNOSIS — S59902A Unspecified injury of left elbow, initial encounter: Secondary | ICD-10-CM | POA: Diagnosis present

## 2016-08-08 DIAGNOSIS — J45909 Unspecified asthma, uncomplicated: Secondary | ICD-10-CM | POA: Insufficient documentation

## 2016-08-08 NOTE — Discharge Instructions (Signed)
I recommend taking Tylenol or Ibuprofen as prescribed over the counter as needed for pain relief. Continue to rest and elevate your arm and apply ice for 15-20 minutes 3-4 times daily. I recommend keeping your splint on and wearing your arm sling for the next week. Follow-up with your primary care provider next week for follow-up evaluation. Please return to the Emergency Department if symptoms worsen or new onset of fever, redness, worsening pain, swelling, numbness, weakness.

## 2016-08-08 NOTE — ED Provider Notes (Signed)
[facility] DEPT Provider Note   CSN: [id number] Arrival date & time: 08/08/16  1119  By signing my name below, I, Mayer Masker, attest that this documentation has been prepared under the direction and in the presence of Harlene Ramus, [place]. Electronically Signed: Mayer Masker, Scribe. 08/08/16. 1:15 PM.  History   Chief Complaint Chief Complaint  Patient presents with  . Leg Pain   The history is provided by the patient. No language interpreter was used.    HPI Comments: [name] is a 56 y.o. female who presents to the Emergency Department complaining of constant, throbbing left-sided arm/elbow pain since 3 days ago. She states she was just sitting at the Dr's office 4 days ago when the pain arose and he recommended she come to the ED for further evaluation. She was taking tylenol for the pain with mild to no relief. The pain is worsened when she moves her arm. She denies mechanism of injury or trauma to the area. She denies redness to the area. She states she had her arm on a heating pad and couldn't move her arm off of it 4 days ago. She denies other complaints at this time. Denies leg pain.   Past Medical History:  Diagnosis Date  . Asthma   . Back muscle spasm 12/13/2010  . Cancer [facility])    breast cancer, remission 2010  . Chronic back pain   . Chronic foot pain   . COPD (chronic obstructive pulmonary disease) ([place]) 09/14/2010  . H/O MRSA infection 09/14/2010  . High blood cholesterol   . Hyperlipidemia 12/07/2012  . Hypertension   . 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 Psoriasis 12/13/2010  . Tobacco abuse 12/07/2012    Patient Active Problem List   Diagnosis Date Noted  . Eye drainage 02/28/2014  . Conjunctivitis 02/28/2014  . Back pain 02/28/2014  . Hyperlipidemia 12/07/2012  . Tobacco abuse 12/07/2012  . Psoriasis 12/13/2010  . COPD (chronic obstructive pulmonary disease) ([place]) 09/14/2010  . 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 H/O MRSA  infection 09/14/2010  . Anxiety state 05/13/2009  . DEPRESSION 05/13/2009  . HYPERTENSION 05/13/2009  . H/O Stage III Breast cancer, left 05/13/2009    Past Surgical History:  Procedure Laterality Date  . ABDOMINAL HYSTERECTOMY    . CESAREAN SECTION     x7  . MASTECTOMY     left  . MASTECTOMY    . PORT-A-CATH REMOVAL  06/01/2011   Procedure: REMOVAL PORT-A-CATH;  Surgeon: Donato Heinz, MD;  Location: [facility];  Service: General;  Laterality: N/A;  Minor Room  . portacath insertion      OB History    No data available       Home Medications    Prior to Admission medications   Medication Sig Start Date End Date Taking? Authorizing Provider  ALPRAZolam Duanne Moron) 1 MG tablet Take 1 tablet (1 mg total) by mouth 2 (two) times daily as needed. for anxiety Patient taking differently: Take 1 mg by mouth 2 (two) times daily. for anxiety 04/11/16   Baird Cancer, PA-C  amitriptyline (ELAVIL) 10 MG tablet Take 10 mg by mouth at bedtime.    [provider]  amLODipine (NORVASC) 5 MG tablet Take 5 mg by mouth daily.    [provider]  aspirin 81 MG chewable tablet Chew 81 mg by mouth daily.    [provider]  escitalopram (LEXAPRO) 10 MG tablet Take 10 mg by mouth daily.  [provider]  glipiZIDE (GLUCOTROL) 5 MG tablet Take 5 mg by mouth daily.    [provider]  hydrOXYzine (ATARAX/VISTARIL) 50 MG tablet Take 50 mg by mouth daily.     [provider]  lisinopril (PRINIVIL,ZESTRIL) 40 MG tablet Take 40 mg by mouth daily. Reported on 05/06/2015    [provider]  losartan-hydrochlorothiazide (HYZAAR) 100-12.5 MG tablet Take 1 tablet by mouth daily.    [provider]  metFORMIN (GLUCOPHAGE) 500 MG tablet Take 500 mg by mouth 2 (two) times daily with a meal.    [provider]  metoprolol succinate (TOPROL-XL) 50 MG 24 hr tablet Take 50 mg by mouth daily. Take with or immediately following a meal.     [provider]  montelukast (SINGULAIR) 10 MG tablet Take 10 mg by mouth at bedtime.    [provider]  Moxifloxacin HCl (MOXEZA) 0.5 % SOLN Apply to eye.    [provider]  oxyCODONE (ROXICODONE) 15 MG immediate release tablet Take 15 mg by mouth every 4 (four) hours as needed for pain. 07/29/16   [provider]  pravastatin (PRAVACHOL) 20 MG tablet Take 20 mg by mouth daily. Reported on 05/06/2015    [provider]  prochlorperazine (COMPAZINE) 10 MG tablet Take 10 mg by mouth every 6 (six) hours as needed for nausea or vomiting.    [provider]  spironolactone (ALDACTONE) 25 MG tablet Take 25 mg by mouth daily.    [provider]  terbinafine (LAMISIL) 250 MG tablet Take 250 mg by mouth daily.    [provider]    Family History Family History  Problem Relation Age of Onset  . Stroke Mother   . Stroke Father   . Cancer Maternal Aunt   . Cancer Maternal Grandmother   . Cancer Maternal Aunt   . Cancer Maternal Aunt     Social History Social History  Substance Use Topics  . Smoking status: Current Every Day Smoker    Packs/day: 0.25    Years: 36.00  . Smokeless tobacco: Never Used  . Alcohol use No     Allergies   Codeine and Penicillins   Review of Systems Review of Systems  Musculoskeletal: Positive for arthralgias.  Skin: Positive for wound. Negative for color change.     Physical Exam Updated Vital Signs BP (!) 148/78 (BP Location: Left Arm)   Pulse 72   Temp 99.2 F (37.3 C) (Oral)   Resp 20   SpO2 100%   Physical Exam  Constitutional: She is oriented to person, place, and time. She appears well-developed and well-nourished.  HENT:  Head: Normocephalic and atraumatic.  Eyes: Conjunctivae and EOM are normal. Right eye exhibits no discharge. Left eye exhibits no discharge. No scleral icterus.  Neck: Normal range of motion. Neck supple.  Cardiovascular: Normal rate and intact  distal pulses.   Pulmonary/Chest: Effort normal.  Musculoskeletal: Normal range of motion. She exhibits tenderness. She exhibits no edema or deformity.  Diffuse TTP over left elbow, humerus, and shoulder Multiple, small bola present to posterior aspect of left elbow without surrounding erythema, warmth, or swelling. Decreased ROM of left shoulder due to pain Full ROM of left elbow form, wrist, and hand with 5/5 strength Sensation grossly intact Cap refill <2 2+ radial pulse  Neurological: She is alert and oriented to person, place, and time.  Skin: Skin is warm and dry. Capillary refill takes less than 2 seconds.  Nursing note and  vitals reviewed.    ED Treatments / Results  DIAGNOSTIC STUDIES: Oxygen Saturation is 90% on RA, low by my interpretation.    COORDINATION OF CARE: 1:12 PM Discussed treatment plan with pt at bedside and pt agreed to plan.  Labs (all labs ordered are listed, but only abnormal results are displayed) Labs Reviewed - No data to display  EKG  EKG Interpretation None       Radiology Dg Elbow Complete Left  Result Date: 08/08/2016 CLINICAL DATA:  Pain.  No known injury. EXAM: LEFT ELBOW - COMPLETE 3+ VIEW COMPARISON:  No recent prior. FINDINGS: Left elbow joint effusion cannot be excluded. Subtle fracture of the coronoid process of the ulna appears to be present. Corticated bony density noted adjacent to the radial epicondyle of the distal humerus most likely from prior injury or non fusion of secondary ossification center. IMPRESSION: Subtle fracture of the coronoid process of the ulna. Associated left elbow joint effusion cannot be excluded . Electronically Signed   By: Marcello Moores  Register   On: 08/08/2016 14:26   Dg Shoulder Left  Result Date: 08/08/2016 CLINICAL DATA:  Left shoulder pain. EXAM: LEFT SHOULDER - 2+ VIEW COMPARISON:  No recent . FINDINGS: Acromioclavicular and glenohumeral degenerative change. No acute bony abnormality identified. Surgical  clips noted over the left chest. IMPRESSION: Acromioclavicular and glenohumeral degenerative change. No acute or focal bony abnormality. Electronically Signed   By: Marcello Moores  Register   On: 08/08/2016 14:28    Procedures Procedures (including critical care time)  Medications Ordered in ED Medications - No data to display   Initial Impression / Assessment and Plan / ED Course  I have reviewed the triage vital signs and the nursing notes.  Pertinent labs & imaging results that were available during my care of the patient were reviewed by me and considered in my medical decision making (see chart for details).     Patient presents with left elbow and arm pain for the past 4 days. Denies any recent fall, trauma or injury. Reports she has been applying a heating pad to the back of her arm and taking Tylenol without relief. VSS. Exam revealed multiple small bulla to left posterior elbow consistent with superficial partial-thickness burn. Patient with diffuse tenderness to left elbow and left humerus with decreased range of motion of left shoulder due to reported pain. Bilateral upper extremities otherwise neurovascularly intact. Left elbow x-ray revealed subtle fracture of the coronoid process of ulna. Left shoulder x-ray showed AC and glenohumeral degenerative changes, no acute bony abnormality. Patient placed in a posterior long-arm splint in the ED and given sling immobilizer. Definitive fracture treatment provided in the ED. Discussed results and plan for discharge with patient. Advised patient to follow up with PCP within the next week for follow-up evaluation. Discussed return precautions.  Final Clinical Impressions(s) / ED Diagnoses   Final diagnoses:  Closed nondisplaced fracture of coronoid process of left ulna, initial encounter    New Prescriptions New Prescriptions   No medications on file   I personally performed the services described in this documentation, which was scribed in my  presence. The recorded information has been reviewed and is accurate.     Nona Dell, PA-C 08/08/16 1521    Malvin Johns, MD 08/08/16 1525

## 2016-08-08 NOTE — Progress Notes (Signed)
Orthopedic Tech Progress Note Patient Details:  [name] [birth year] [id number]  Ortho Devices Type of Ortho Device: Post (long arm) splint, Arm sling Ortho Device/Splint Location: applied long arm splint to pt left arm.  provided arm sling to left arm for support.  pt tolerated application very well.  left arm Ortho Device/Splint Interventions: Application, Adjustment   Kristopher Oppenheim 08/08/2016, 3:28 PM

## 2016-08-08 NOTE — ED Notes (Signed)
Upon arrival to treatment room patient asking for snack.  Advised patient she needs to be evaluated by provider before eating or drinking.

## 2016-08-08 NOTE — ED Triage Notes (Signed)
Patient arrrivd by [facility] with complaint of ongoing ankle pain and now complains of arm pain. Seen Friday for same and left prior to disposition

## 2016-08-08 NOTE — ED Notes (Signed)
Ortho paged to see pt

## 2016-09-05 ENCOUNTER — Ambulatory Visit (INDEPENDENT_AMBULATORY_CARE_PROVIDER_SITE_OTHER): Payer: Medicaid Other | Admitting: Orthopaedic Surgery

## 2016-09-22 ENCOUNTER — Other Ambulatory Visit (HOSPITAL_COMMUNITY): Payer: Self-pay | Admitting: Oncology

## 2016-09-22 DIAGNOSIS — A4902 Methicillin resistant Staphylococcus aureus infection, unspecified site: Secondary | ICD-10-CM

## 2016-09-22 DIAGNOSIS — L02224 Furuncle of groin: Secondary | ICD-10-CM

## 2016-09-22 DIAGNOSIS — F411 Generalized anxiety disorder: Secondary | ICD-10-CM

## 2016-09-30 ENCOUNTER — Other Ambulatory Visit (HOSPITAL_COMMUNITY): Payer: Self-pay | Admitting: Adult Health

## 2016-09-30 ENCOUNTER — Encounter (HOSPITAL_COMMUNITY): Payer: Self-pay | Admitting: Adult Health

## 2016-09-30 DIAGNOSIS — L02224 Furuncle of groin: Secondary | ICD-10-CM

## 2016-09-30 DIAGNOSIS — A4902 Methicillin resistant Staphylococcus aureus infection, unspecified site: Secondary | ICD-10-CM

## 2016-09-30 DIAGNOSIS — F411 Generalized anxiety disorder: Secondary | ICD-10-CM

## 2016-09-30 MED ORDER — ALPRAZOLAM 1 MG PO TABS: 1.0000 mg | ORAL_TABLET | Freq: Two times a day (BID) | ORAL | 5 refills | Status: DC | PRN

## 2016-09-30 NOTE — Progress Notes (Signed)
Received refill request from pharmacy for Xanax.   [place] Controlled Substance Reporting System reviewed and refill is appropriate. Paper prescription is printed and I will ask nursing to fax to pharmacy.     Mike Craze, NP [facility] [phone]

## 2016-10-03 ENCOUNTER — Ambulatory Visit (HOSPITAL_COMMUNITY): Payer: Medicaid Other

## 2016-10-08 ENCOUNTER — Emergency Department (HOSPITAL_COMMUNITY): Payer: Medicaid Other

## 2016-10-08 ENCOUNTER — Inpatient Hospital Stay (HOSPITAL_COMMUNITY)
Admission: EM | Admit: 2016-10-08 | Discharge: 2016-10-11 | DRG: 065 | Disposition: A | Discharge status: Rehabilitation Facility | Payer: Medicaid Other | Attending: Family Medicine | Admitting: Family Medicine

## 2016-10-08 ENCOUNTER — Encounter (HOSPITAL_COMMUNITY): Payer: Self-pay | Admitting: Emergency Medicine

## 2016-10-08 DIAGNOSIS — Z90710 Acquired absence of both cervix and uterus: Secondary | ICD-10-CM

## 2016-10-08 DIAGNOSIS — E876 Hypokalemia: Secondary | ICD-10-CM | POA: Diagnosis present

## 2016-10-08 DIAGNOSIS — Z853 Personal history of malignant neoplasm of breast: Secondary | ICD-10-CM

## 2016-10-08 DIAGNOSIS — Z7984 Long term (current) use of oral hypoglycemic drugs: Secondary | ICD-10-CM

## 2016-10-08 DIAGNOSIS — I6339 Cerebral infarction due to thrombosis of other cerebral artery: Principal | ICD-10-CM | POA: Diagnosis present

## 2016-10-08 DIAGNOSIS — Z79899 Other long term (current) drug therapy: Secondary | ICD-10-CM

## 2016-10-08 DIAGNOSIS — Z7401 Bed confinement status: Secondary | ICD-10-CM

## 2016-10-08 DIAGNOSIS — F17210 Nicotine dependence, cigarettes, uncomplicated: Secondary | ICD-10-CM | POA: Diagnosis present

## 2016-10-08 DIAGNOSIS — Z720 Tobacco use: Secondary | ICD-10-CM | POA: Diagnosis present

## 2016-10-08 DIAGNOSIS — R4182 Altered mental status, unspecified: Secondary | ICD-10-CM

## 2016-10-08 DIAGNOSIS — Z7982 Long term (current) use of aspirin: Secondary | ICD-10-CM

## 2016-10-08 DIAGNOSIS — G8191 Hemiplegia, unspecified affecting right dominant side: Secondary | ICD-10-CM | POA: Diagnosis present

## 2016-10-08 DIAGNOSIS — E860 Dehydration: Secondary | ICD-10-CM | POA: Diagnosis present

## 2016-10-08 DIAGNOSIS — I639 Cerebral infarction, unspecified: Secondary | ICD-10-CM

## 2016-10-08 DIAGNOSIS — E1169 Type 2 diabetes mellitus with other specified complication: Secondary | ICD-10-CM | POA: Diagnosis present

## 2016-10-08 DIAGNOSIS — Z9012 Acquired absence of left breast and nipple: Secondary | ICD-10-CM

## 2016-10-08 DIAGNOSIS — L0390 Cellulitis, unspecified: Secondary | ICD-10-CM | POA: Diagnosis present

## 2016-10-08 DIAGNOSIS — E785 Hyperlipidemia, unspecified: Secondary | ICD-10-CM | POA: Diagnosis present

## 2016-10-08 DIAGNOSIS — G894 Chronic pain syndrome: Secondary | ICD-10-CM | POA: Diagnosis present

## 2016-10-08 DIAGNOSIS — G458 Other transient cerebral ischemic attacks and related syndromes: Secondary | ICD-10-CM | POA: Diagnosis present

## 2016-10-08 DIAGNOSIS — N179 Acute kidney failure, unspecified: Secondary | ICD-10-CM

## 2016-10-08 DIAGNOSIS — E872 Acidosis: Secondary | ICD-10-CM | POA: Diagnosis present

## 2016-10-08 DIAGNOSIS — Z8614 Personal history of Methicillin resistant Staphylococcus aureus infection: Secondary | ICD-10-CM

## 2016-10-08 DIAGNOSIS — I10 Essential (primary) hypertension: Secondary | ICD-10-CM | POA: Diagnosis present

## 2016-10-08 DIAGNOSIS — I6340 Cerebral infarction due to embolism of unspecified cerebral artery: Secondary | ICD-10-CM | POA: Diagnosis present

## 2016-10-08 DIAGNOSIS — F411 Generalized anxiety disorder: Secondary | ICD-10-CM | POA: Diagnosis present

## 2016-10-08 DIAGNOSIS — E1142 Type 2 diabetes mellitus with diabetic polyneuropathy: Secondary | ICD-10-CM | POA: Diagnosis present

## 2016-10-08 DIAGNOSIS — E7800 Pure hypercholesterolemia, unspecified: Secondary | ICD-10-CM | POA: Diagnosis present

## 2016-10-08 DIAGNOSIS — J449 Chronic obstructive pulmonary disease, unspecified: Secondary | ICD-10-CM | POA: Diagnosis present

## 2016-10-08 DIAGNOSIS — Z823 Family history of stroke: Secondary | ICD-10-CM

## 2016-10-08 DIAGNOSIS — I748 Embolism and thrombosis of other arteries: Secondary | ICD-10-CM

## 2016-10-08 DIAGNOSIS — Z6835 Body mass index (BMI) 35.0-35.9, adult: Secondary | ICD-10-CM

## 2016-10-08 DIAGNOSIS — R29707 NIHSS score 7: Secondary | ICD-10-CM | POA: Diagnosis present

## 2016-10-08 DIAGNOSIS — E669 Obesity, unspecified: Secondary | ICD-10-CM | POA: Diagnosis present

## 2016-10-08 DIAGNOSIS — I959 Hypotension, unspecified: Secondary | ICD-10-CM | POA: Diagnosis present

## 2016-10-08 DIAGNOSIS — N289 Disorder of kidney and ureter, unspecified: Secondary | ICD-10-CM

## 2016-10-08 DIAGNOSIS — M6281 Muscle weakness (generalized): Secondary | ICD-10-CM

## 2016-10-08 LAB — URINALYSIS, ROUTINE W REFLEX MICROSCOPIC
Bacteria, UA: NONE SEEN
Bilirubin Urine: NEGATIVE
Glucose, UA: NEGATIVE mg/dL
Ketones, ur: NEGATIVE mg/dL
Leukocytes, UA: NEGATIVE
Nitrite: NEGATIVE
Protein, ur: NEGATIVE mg/dL
Specific Gravity, Urine: 1.025 (ref 1.005–1.030)
pH: 5 (ref 5.0–8.0)

## 2016-10-08 LAB — I-STAT TROPONIN, ED: Troponin i, poc: 0.01 ng/mL (ref 0.00–0.08)

## 2016-10-08 LAB — COMPREHENSIVE METABOLIC PANEL
ALT: 15 U/L (ref 14–54)
AST: 21 U/L (ref 15–41)
Albumin: 3.1 g/dL — ABNORMAL LOW (ref 3.5–5.0)
Alkaline Phosphatase: 76 U/L (ref 38–126)
Anion gap: 11 (ref 5–15)
BUN: 44 mg/dL — ABNORMAL HIGH (ref 6–20)
CO2: 28 mmol/L (ref 22–32)
Calcium: 9.3 mg/dL (ref 8.9–10.3)
Chloride: 101 mmol/L (ref 101–111)
Creatinine, Ser: 1.23 mg/dL — ABNORMAL HIGH (ref 0.44–1.00)
GFR calc Af Amer: 57 mL/min — ABNORMAL LOW (ref >60)
GFR calc non Af Amer: 49 mL/min — ABNORMAL LOW (ref >60)
Glucose, Bld: 140 mg/dL — ABNORMAL HIGH (ref 65–99)
Potassium: 3.5 mmol/L (ref 3.5–5.1)
Sodium: 140 mmol/L (ref 135–145)
Total Bilirubin: 0.8 mg/dL (ref 0.3–1.2)
Total Protein: 6.7 g/dL (ref 6.5–8.1)

## 2016-10-08 LAB — I-STAT CG4 LACTIC ACID, ED: LACTIC ACID, VENOUS: 1.92 mmol/L — AB (ref 0.5–1.9)

## 2016-10-08 LAB — CBC
HCT: 47.5 % — ABNORMAL HIGH (ref 36.0–46.0)
Hemoglobin: 15.4 g/dL — ABNORMAL HIGH (ref 12.0–15.0)
MCH: 30.3 pg (ref 26.0–34.0)
MCHC: 32.4 g/dL (ref 30.0–36.0)
MCV: 93.5 fL (ref 78.0–100.0)
Platelets: 286 10*3/uL (ref 150–400)
RBC: 5.08 MIL/uL (ref 3.87–5.11)
RDW: 15.5 % (ref 11.5–15.5)
WBC: 15.6 10*3/uL — ABNORMAL HIGH (ref 4.0–10.5)

## 2016-10-08 LAB — CK: Total CK: 195 U/L (ref 38–234)

## 2016-10-08 MED ORDER — SODIUM CHLORIDE 0.9 % IV BOLUS (SEPSIS)
1000.0000 mL | Freq: Once | INTRAVENOUS | Status: AC
  Administered 2016-10-08: 1000 mL via INTRAVENOUS

## 2016-10-08 MED ORDER — VANCOMYCIN HCL IN DEXTROSE 1-5 GM/200ML-% IV SOLN: 1000.0000 mg | Freq: Once | INTRAVENOUS | Status: DC

## 2016-10-08 MED ORDER — VANCOMYCIN HCL IN DEXTROSE 750-5 MG/150ML-% IV SOLN
750.0000 mg | Freq: Two times a day (BID) | INTRAVENOUS | Status: DC
  Administered 2016-10-09 – 2016-10-10 (×4): 750 mg via INTRAVENOUS
  Filled 2016-10-08 (×7): qty 150

## 2016-10-08 MED ORDER — DEXTROSE 5 % IV SOLN
1.0000 g | Freq: Three times a day (TID) | INTRAVENOUS | Status: DC
  Administered 2016-10-08 – 2016-10-10 (×6): 1 g via INTRAVENOUS
  Filled 2016-10-08 (×8): qty 1

## 2016-10-08 NOTE — ED Provider Notes (Signed)
Medical screening examination/treatment/procedure(s) were conducted as a shared visit with non-physician practitioner(s) and myself.  I personally evaluated the patient during the encounter.  Unclear chain of events but it seemed that this patient was bedbound for a week secondary to difficulty moving her right side. She was defecating and urinating on herself and her husband is bringing her food and drink. She refused to come the hospital. Chronic exam here she is alert and seems to be oriented but doesn't make rational connections. She has significant erythema all the way down the right side of her body which appears like a macerated skin. It is not warm or tender to suggest an infection. She has decreased strength in her right arm and leg. Her vital signs are within normal limits.  Plan for a infectious versus neurologic workup and admission.   EKG Interpretation  Date/Time:  Saturday October 08 2016 15:54:02 EDT Ventricular Rate:  89 PR Interval:    QRS Duration: 92 QT Interval:  362 QTC Calculation: 441 R Axis:   80 Text Interpretation:  Sinus rhythm Biatrial enlargement Probable left ventricular hypertrophy Nonspecific T abnormalities, inferior leads new T wave changes since january 2018 Confirmed by Merrily Pew [phone]) on 10/08/2016 4:19:38 PM         Ima Hafner, Corene Cornea, MD 10/09/16 1250

## 2016-10-08 NOTE — ED Triage Notes (Signed)
Per EMS:  Pt presents to ED for assessment right sided paralysis after a syncopal episode 2 weeks ago.  Pt has not been up from the bed since.  Pt will eat whatever her boyfriend will bring her.  Pt smells of strong ammonia.  Pt BP 88/40 en route, given 349ml of fluid.  HR 108.  CBG 151.

## 2016-10-08 NOTE — ED Notes (Signed)
Patient transported to MRI 

## 2016-10-08 NOTE — ED Provider Notes (Signed)
[facility] DEPT Provider Note   CSN: [id number] Arrival date & time: 10/08/16  1523     History   Chief Complaint Chief Complaint  Patient presents with  . Altered Mental Status    HPI [name] is a 56 y.o. female.  HPI    56 year old female presents today with altered mental status.  History was obtained from patient and friend who is in the room.  According to patient's friend she notes speaking with her last on 7 August approximately 11 days ago.  She notes she was at her baseline which is very clear and coherent, taking care of herself on her own.  She notes she was unable to talk to her over the last week and finally got a hold of her today.  She notes that she seemed altered and went to her house.  She found her lying in the bed covered in urine and feces.   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  She notes she has significant difficulty lifting her right leg and right arm due to "heaviness".  She also notes she is unable to control her bowel or bladder.  She also reports subjective fevers at home.  She reports prior to onset of all of this she did have a episode of nausea vomiting.    Past Medical History:  Diagnosis Date  . Asthma   . Back muscle spasm 12/13/2010  . Cancer [facility])    breast cancer, remission 2010  . Chronic back pain   . Chronic foot pain   . COPD (chronic obstructive pulmonary disease) ([place]) 09/14/2010  . H/O MRSA infection 09/14/2010  . High blood cholesterol   . Hyperlipidemia 12/07/2012  . Hypertension   . 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 Psoriasis 12/13/2010  . Tobacco abuse 12/07/2012    Patient Active Problem List   Diagnosis Date Noted    . Eye drainage 02/28/2014  . Conjunctivitis 02/28/2014  . Back pain 02/28/2014  . Hyperlipidemia 12/07/2012  . Tobacco abuse 12/07/2012  . Psoriasis 12/13/2010  . COPD (chronic obstructive pulmonary disease) ([place]) 09/14/2010  . 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 H/O MRSA infection 09/14/2010  . Anxiety state 05/13/2009  . DEPRESSION 05/13/2009  . HYPERTENSION 05/13/2009  . H/O Stage III Breast cancer, left 05/13/2009    Past Surgical History:  Procedure Laterality Date  . ABDOMINAL HYSTERECTOMY    . CESAREAN SECTION     x7  . MASTECTOMY     left  . MASTECTOMY    . PORT-A-CATH REMOVAL  06/01/2011   Procedure: REMOVAL PORT-A-CATH;  Surgeon: Donato Heinz, MD;  Location: [facility];  Service: General;  Laterality: N/A;  Minor Room  . portacath insertion      OB History    No data available       Home Medications    Prior to Admission medications   Medication Sig Start Date End Date Taking? Authorizing Provider  OxyCODONE ER (XTAMPZA ER) 9 MG C12A Take 9 mg by mouth every 12 (twelve) hours.   Yes [provider]  ALPRAZolam Duanne Moron) 1 MG tablet Take 1 tablet (1 mg total) by mouth 2 (  two) times daily as needed. for anxiety 09/30/16   Holley Bouche, NP  amitriptyline (ELAVIL) 10 MG tablet Take 10 mg by mouth at bedtime.    [provider]  amLODipine (NORVASC) 5 MG tablet Take 5 mg by mouth daily.    [provider]  aspirin 81 MG chewable tablet Chew 81 mg by mouth daily.    [provider]  Diclofenac Sodium 3 % GEL Apply 2-3 g topically See admin instructions. To affected areas three to four times a day 09/27/16   [provider]  escitalopram (LEXAPRO) 10 MG tablet Take 10 mg by mouth daily.    [provider]  glipiZIDE (GLUCOTROL) 5 MG tablet Take 5 mg by mouth daily.    [provider]  hydrOXYzine (ATARAX/VISTARIL) 50 MG tablet Take 50 mg by mouth daily.     [provider]  lisinopril  (PRINIVIL,ZESTRIL) 40 MG tablet Take 40 mg by mouth daily. Reported on 05/06/2015    [provider]  losartan-hydrochlorothiazide (HYZAAR) 100-12.5 MG tablet Take 1 tablet by mouth daily.    [provider]  metFORMIN (GLUCOPHAGE) 500 MG tablet Take 500 mg by mouth 2 (two) times daily with a meal.    [provider]  metoprolol succinate (TOPROL-XL) 50 MG 24 hr tablet Take 50 mg by mouth daily. Take with or immediately following a meal.    [provider]  montelukast (SINGULAIR) 10 MG tablet Take 10 mg by mouth at bedtime.    [provider]  Moxifloxacin HCl (MOXEZA) 0.5 % SOLN Apply to eye.    [provider]  oxyCODONE (ROXICODONE) 15 MG immediate release tablet Take 15 mg by mouth every 4 (four) hours as needed for pain. 07/29/16   [provider]  pravastatin (PRAVACHOL) 20 MG tablet Take 20 mg by mouth daily. Reported on 05/06/2015    [provider]  prochlorperazine (COMPAZINE) 10 MG tablet Take 10 mg by mouth every 6 (six) hours as needed for nausea or vomiting.    [provider]  spironolactone (ALDACTONE) 25 MG tablet Take 25 mg by mouth daily.    [provider]  terbinafine (LAMISIL) 250 MG tablet Take 250 mg by mouth daily.    [provider]    Family History Family History  Problem Relation Age of Onset  . Stroke Mother   . Stroke Father   . Cancer Maternal Aunt   . Cancer Maternal Grandmother   . Cancer Maternal Aunt   . Cancer Maternal Aunt     Social History Social History  Substance Use Topics  . Smoking status: Current Every Day Smoker    Packs/day: 0.25    Years: 36.00  . Smokeless tobacco: Never Used  . Alcohol use No     Allergies   Codeine and Penicillins   Review of Systems Review of Systems  Unable to perform ROS: Acuity of condition     Physical Exam Updated Vital Signs BP 118/82   Pulse 93   Temp 99.7 F (37.6 C) (Rectal)   Resp 20    Physical Exam  Constitutional: She is oriented to person, place, and time. She appears well-developed and well-nourished.  HENT:  Head: Normocephalic and atraumatic.  Eyes: Pupils are equal, round, and reactive to light. Conjunctivae are normal. Right eye exhibits no discharge. Left eye exhibits no discharge. No scleral icterus.  Neck: Normal range of motion. No JVD present. No tracheal deviation present.  Pulmonary/Chest: Effort normal.  No stridor.  Musculoskeletal:  No C,T, or L spine TTP  Neurological: She is alert and oriented to person, place, and time. She displays normal reflexes. No cranial nerve deficit or sensory deficit. She exhibits normal muscle tone. Coordination normal.  4/5 strength to right upper and lower extremities - normal rectal tone   Skin: Skin is warm. Rash noted.  Rash noted to right lateral chest, abd, and lower extremity   Psychiatric: She has a normal mood and affect. Her behavior is normal. Judgment and thought content normal.  Nursing note and vitals reviewed.    ED Treatments / Results  Labs (all labs ordered are listed, but only abnormal results are displayed) Labs Reviewed  COMPREHENSIVE METABOLIC PANEL - Abnormal; Notable for the following:       Result Value   Glucose, Bld 140 (*)    BUN 44 (*)    Creatinine, Ser 1.23 (*)    Albumin 3.1 (*)    GFR calc non Af Amer 49 (*)    GFR calc Af Amer 57 (*)    All other components within normal limits  CBC - Abnormal; Notable for the following:    WBC 15.6 (*)    Hemoglobin 15.4 (*)    HCT 47.5 (*)    All other components within normal limits  URINALYSIS, ROUTINE W REFLEX MICROSCOPIC - Abnormal; Notable for the following:    Hgb urine dipstick MODERATE (*)    Squamous Epithelial / LPF 0-5 (*)    All other components within normal limits  I-STAT CG4 LACTIC ACID, ED - Abnormal; Notable for the following:    Lactic Acid, Venous 1.92 (*)    All other components within normal limits  CULTURE, BLOOD  (ROUTINE X 2)  CULTURE, BLOOD (ROUTINE X 2)  CK  I-STAT TROPONIN, ED  I-STAT CG4 LACTIC ACID, ED    EKG  EKG Interpretation  Date/Time:  Saturday October 08 2016 15:54:02 EDT Ventricular Rate:  89 PR Interval:    QRS Duration: 92 QT Interval:  362 QTC Calculation: 441 R Axis:   80 Text Interpretation:  Sinus rhythm Biatrial enlargement Probable left ventricular hypertrophy Nonspecific T abnormalities, inferior leads new T wave changes since january 2018 Confirmed by Merrily Pew [phone]) on 10/08/2016 4:19:38 PM       Radiology Dg Chest 2 View  Result Date: 10/08/2016 CLINICAL DATA:  Right-sided paralysis.  Syncope. EXAM: CHEST  2 VIEW COMPARISON:  None. FINDINGS: No pneumothorax. The heart and hila are normal. There is a tortuous thoracic aorta which is similar in the interval. The mediastinum is unremarkable. Rounded density over the lateral left lung base is favored represent overlapping soft tissues. A nodule in the lung is considered less likely and this region was normal in May of 2017. Vascular crowding in the medial right lung base. No focal infiltrate identified. No other acute abnormalities. IMPRESSION: Rounded density over the lateral left lung base is favored to represent overlapping redundant soft tissues. A repeat PA and lateral when the patient can be imaged in a completely erect position could exclude the less likely possibility of a pulmonary nodule. Electronically Signed   By: Dorise Bullion III M.D   On: 10/08/2016 18:09   Ct Head Wo Contrast  Result Date: 10/08/2016 CLINICAL DATA:  Right-sided hemiplegia EXAM: CT HEAD WITHOUT CONTRAST TECHNIQUE: Contiguous axial images were obtained from the base of the skull through the vertex without intravenous contrast. COMPARISON:  March 10, 2016 FINDINGS: Brain: The ventricles are normal  in size and configuration. There is no intracranial mass, hemorrhage, extra-axial fluid collection, or midline shift. There is minimal small  vessel disease in the centra semiovale bilaterally. Elsewhere gray-white compartments appear normal. No acute infarct is appreciable on this study. Vascular: There is no appreciable hyperdense vessel. There is calcification in each carotid siphon region. There is also calcification in the distal vertebral arteries bilaterally. Skull: Bony calvarium appears intact. Sinuses/Orbits: There is mild mucosal thickening in several ethmoid air cells bilaterally. There is mucosal thickening in the posterior right sphenoid sinus. Other paranasal sinuses are clear. Orbits appear symmetric bilaterally. Other: Mastoid air cells are clear. IMPRESSION: Rather minimal periventricular small vessel disease. No evident acute infarct. No mass, hemorrhage, or extra-axial fluid collection. Somewhat age advanced atherosclerotic arterial vascular calcification. Areas of paranasal sinus disease. Electronically Signed   By: Lowella Grip III M.D.   On: 10/08/2016 17:39   Dg Hand Complete Right  Result Date: 10/08/2016 CLINICAL DATA:  Hand pain following fall, initial encounter EXAM: RIGHT HAND - COMPLETE 3+ VIEW COMPARISON:  None. FINDINGS: There is no evidence of fracture or dislocation. There is no evidence of arthropathy or other focal bone abnormality. Soft tissues are unremarkable. IMPRESSION: No acute abnormality noted. Electronically Signed   By: Inez Catalina M.D.   On: 10/08/2016 17:22    Procedures Procedures (including critical care time)  Medications Ordered in ED Medications  aztreonam (AZACTAM) 1 g in dextrose 5 % 50 mL IVPB (not administered)  vancomycin (VANCOCIN) IVPB 1000 mg/200 mL premix (not administered)  vancomycin (VANCOCIN) IVPB 750 mg/150 ml premix (not administered)  sodium chloride 0.9 % bolus 1,000 mL (1,000 mLs Intravenous New Bag/Given 10/08/16 1847)     Initial Impression / Assessment and Plan / ED Course  I have reviewed the triage vital signs and the nursing notes.  Pertinent labs &  imaging results that were available during my care of the patient were reviewed by me and considered in my medical decision making (see chart for details).     Final Clinical Impressions(s) / ED Diagnoses   Final diagnoses:  Right-sided muscle weakness  Altered mental status, unspecified altered mental status type  AKI (acute kidney injury) ([place])    Labs: I-STAT lactic acid, i-STAT troponin, CK, CMP, CBC, urinalysis   Imaging: DG Chest 2 View CT head without, DG hand complete right  Consults: Neurology  Therapeutics: Normal saline, vancomycin, Azactam  Discharge Meds:   Assessment/Plan: 56 year old female presents today with altered mental status and right-sided hemiplegia.  Patient has an elevated white count, confusion here question infectious etiology.  Patient has no CT or L-spine tenderness palpation.  She does have right-sided strength deficits, no acute sensory deficits although patient is a difficult historian.  Patient was started on broad-spectrum antibiotics, neurology was consulted who recommended patient have MRI brain, MRI cervical spine with contrast here in the ED, he will evaluate her here.    New Prescriptions New Prescriptions   No medications on file     Francee Gentile 10/08/16 2037    Virgel Manifold, MD 10/09/16 1144

## 2016-10-08 NOTE — ED Notes (Signed)
Patient continues to be off department

## 2016-10-08 NOTE — Progress Notes (Signed)
Pharmacy Antibiotic Note [name] is a 56 y.o. female admitted on 10/08/2016 with concern for sepsis.  Pharmacy has been consulted for Azactam and vancomycin dosing.  Plan: 1. Azactam 1 gram IV every 8 hours 2. Vancomycin 750 mg IV every 24 hours   Temp (24hrs), Avg:99.7 F (37.6 C), Min:99.7 F (37.6 C), Max:99.7 F (37.6 C)   Recent Labs Lab 10/08/16 1523 10/08/16 1855  WBC 15.6*  --   CREATININE 1.23*  --   LATICACIDVEN  --  1.92*    CrCl cannot be calculated (Unknown ideal weight.).    Allergies  Allergen Reactions  . Codeine Anaphylaxis  . Penicillins Anaphylaxis    Has patient had a PCN reaction causing immediate rash, facial/tongue/throat swelling, SOB or lightheadedness with hypotension: Yes Has patient had a PCN reaction causing severe rash involving mucus membranes or skin necrosis: No Has patient had a PCN reaction that required hospitalization: Yes Has patient had a PCN reaction occurring within the last 10 years: No If all of the above answers are "NO", then may proceed with Cephalosporin use.     Thank you for allowing pharmacy to be a part of this patient's care.  Vincenza Hews, PharmD, BCPS 10/08/2016, 7:25 PM

## 2016-10-09 ENCOUNTER — Inpatient Hospital Stay (HOSPITAL_COMMUNITY): Payer: Medicaid Other

## 2016-10-09 DIAGNOSIS — E1169 Type 2 diabetes mellitus with other specified complication: Secondary | ICD-10-CM | POA: Diagnosis not present

## 2016-10-09 DIAGNOSIS — I10 Essential (primary) hypertension: Secondary | ICD-10-CM | POA: Diagnosis present

## 2016-10-09 DIAGNOSIS — R4182 Altered mental status, unspecified: Secondary | ICD-10-CM

## 2016-10-09 DIAGNOSIS — F411 Generalized anxiety disorder: Secondary | ICD-10-CM | POA: Diagnosis not present

## 2016-10-09 DIAGNOSIS — E669 Obesity, unspecified: Secondary | ICD-10-CM | POA: Diagnosis present

## 2016-10-09 DIAGNOSIS — I6340 Cerebral infarction due to embolism of unspecified cerebral artery: Secondary | ICD-10-CM | POA: Diagnosis present

## 2016-10-09 DIAGNOSIS — E785 Hyperlipidemia, unspecified: Secondary | ICD-10-CM

## 2016-10-09 DIAGNOSIS — N289 Disorder of kidney and ureter, unspecified: Secondary | ICD-10-CM

## 2016-10-09 DIAGNOSIS — N179 Acute kidney failure, unspecified: Secondary | ICD-10-CM

## 2016-10-09 DIAGNOSIS — Z6835 Body mass index (BMI) 35.0-35.9, adult: Secondary | ICD-10-CM | POA: Diagnosis not present

## 2016-10-09 DIAGNOSIS — L0390 Cellulitis, unspecified: Secondary | ICD-10-CM | POA: Diagnosis present

## 2016-10-09 DIAGNOSIS — F17210 Nicotine dependence, cigarettes, uncomplicated: Secondary | ICD-10-CM | POA: Diagnosis present

## 2016-10-09 DIAGNOSIS — E860 Dehydration: Secondary | ICD-10-CM | POA: Diagnosis present

## 2016-10-09 DIAGNOSIS — G8191 Hemiplegia, unspecified affecting right dominant side: Secondary | ICD-10-CM | POA: Diagnosis present

## 2016-10-09 DIAGNOSIS — I638 Other cerebral infarction: Secondary | ICD-10-CM

## 2016-10-09 DIAGNOSIS — M6281 Muscle weakness (generalized): Secondary | ICD-10-CM

## 2016-10-09 DIAGNOSIS — E7800 Pure hypercholesterolemia, unspecified: Secondary | ICD-10-CM | POA: Diagnosis present

## 2016-10-09 DIAGNOSIS — J42 Unspecified chronic bronchitis: Secondary | ICD-10-CM | POA: Diagnosis not present

## 2016-10-09 DIAGNOSIS — J449 Chronic obstructive pulmonary disease, unspecified: Secondary | ICD-10-CM | POA: Diagnosis present

## 2016-10-09 DIAGNOSIS — Z7982 Long term (current) use of aspirin: Secondary | ICD-10-CM | POA: Diagnosis not present

## 2016-10-09 DIAGNOSIS — J410 Simple chronic bronchitis: Secondary | ICD-10-CM | POA: Diagnosis not present

## 2016-10-09 DIAGNOSIS — I63413 Cerebral infarction due to embolism of bilateral middle cerebral arteries: Secondary | ICD-10-CM | POA: Diagnosis not present

## 2016-10-09 DIAGNOSIS — Z7984 Long term (current) use of oral hypoglycemic drugs: Secondary | ICD-10-CM | POA: Diagnosis not present

## 2016-10-09 DIAGNOSIS — Z8673 Personal history of transient ischemic attack (TIA), and cerebral infarction without residual deficits: Secondary | ICD-10-CM | POA: Insufficient documentation

## 2016-10-09 DIAGNOSIS — Z9012 Acquired absence of left breast and nipple: Secondary | ICD-10-CM | POA: Diagnosis not present

## 2016-10-09 DIAGNOSIS — G894 Chronic pain syndrome: Secondary | ICD-10-CM | POA: Diagnosis present

## 2016-10-09 DIAGNOSIS — I6339 Cerebral infarction due to thrombosis of other cerebral artery: Secondary | ICD-10-CM | POA: Diagnosis present

## 2016-10-09 DIAGNOSIS — G458 Other transient cerebral ischemic attacks and related syndromes: Secondary | ICD-10-CM | POA: Diagnosis present

## 2016-10-09 DIAGNOSIS — E872 Acidosis: Secondary | ICD-10-CM | POA: Diagnosis present

## 2016-10-09 DIAGNOSIS — I639 Cerebral infarction, unspecified: Secondary | ICD-10-CM | POA: Diagnosis not present

## 2016-10-09 DIAGNOSIS — Z853 Personal history of malignant neoplasm of breast: Secondary | ICD-10-CM | POA: Diagnosis not present

## 2016-10-09 DIAGNOSIS — Z90710 Acquired absence of both cervix and uterus: Secondary | ICD-10-CM | POA: Diagnosis not present

## 2016-10-09 DIAGNOSIS — Z79899 Other long term (current) drug therapy: Secondary | ICD-10-CM | POA: Diagnosis not present

## 2016-10-09 DIAGNOSIS — I6310 Cerebral infarction due to embolism of unspecified precerebral artery: Secondary | ICD-10-CM | POA: Diagnosis not present

## 2016-10-09 DIAGNOSIS — E876 Hypokalemia: Secondary | ICD-10-CM | POA: Diagnosis present

## 2016-10-09 DIAGNOSIS — Z7401 Bed confinement status: Secondary | ICD-10-CM | POA: Diagnosis not present

## 2016-10-09 DIAGNOSIS — Z823 Family history of stroke: Secondary | ICD-10-CM | POA: Diagnosis not present

## 2016-10-09 DIAGNOSIS — Z8614 Personal history of Methicillin resistant Staphylococcus aureus infection: Secondary | ICD-10-CM | POA: Diagnosis not present

## 2016-10-09 DIAGNOSIS — Z720 Tobacco use: Secondary | ICD-10-CM

## 2016-10-09 DIAGNOSIS — I69351 Hemiplegia and hemiparesis following cerebral infarction affecting right dominant side: Secondary | ICD-10-CM | POA: Diagnosis not present

## 2016-10-09 LAB — CBG MONITORING, ED
GLUCOSE-CAPILLARY: 114 mg/dL — AB (ref 65–99)
Glucose-Capillary: 118 mg/dL — ABNORMAL HIGH (ref 65–99)

## 2016-10-09 LAB — LIPID PANEL
Cholesterol: 144 mg/dL (ref 0–200)
HDL: 24 mg/dL — AB (ref >40)
LDL CALC: 80 mg/dL (ref 0–99)
Total CHOL/HDL Ratio: 6 RATIO
Triglycerides: 202 mg/dL — ABNORMAL HIGH (ref <150)
VLDL: 40 mg/dL (ref 0–40)

## 2016-10-09 LAB — GLUCOSE, CAPILLARY
GLUCOSE-CAPILLARY: 116 mg/dL — AB (ref 65–99)
Glucose-Capillary: 112 mg/dL — ABNORMAL HIGH (ref 65–99)

## 2016-10-09 LAB — HEMOGLOBIN A1C
HEMOGLOBIN A1C: 6.5 % — AB (ref 4.8–5.6)
Mean Plasma Glucose: 139.85 mg/dL

## 2016-10-09 LAB — LACTIC ACID, PLASMA
LACTIC ACID, VENOUS: 1.8 mmol/L (ref 0.5–1.9)
Lactic Acid, Venous: 1.1 mmol/L (ref 0.5–1.9)

## 2016-10-09 LAB — HIV ANTIBODY (ROUTINE TESTING W REFLEX): HIV SCREEN 4TH GENERATION: NONREACTIVE

## 2016-10-09 LAB — PROCALCITONIN: Procalcitonin: 0.13 ng/mL

## 2016-10-09 MED ORDER — OXYCODONE HCL 5 MG PO TABS
15.0000 mg | ORAL_TABLET | ORAL | Status: DC | PRN
  Administered 2016-10-09 – 2016-10-11 (×5): 15 mg via ORAL
  Filled 2016-10-09 (×5): qty 3

## 2016-10-09 MED ORDER — ALPRAZOLAM 0.5 MG PO TABS
1.0000 mg | ORAL_TABLET | Freq: Once | ORAL | Status: AC
  Administered 2016-10-09: 1 mg via ORAL
  Filled 2016-10-09: qty 2

## 2016-10-09 MED ORDER — SENNOSIDES-DOCUSATE SODIUM 8.6-50 MG PO TABS
1.0000 | ORAL_TABLET | Freq: Every evening | ORAL | Status: DC | PRN
Start: 2016-10-09 — End: 2016-10-11
  Administered 2016-10-10: 1 via ORAL
  Filled 2016-10-09 (×2): qty 1

## 2016-10-09 MED ORDER — ACETAMINOPHEN 325 MG PO TABS: 650.0000 mg | ORAL_TABLET | ORAL | Status: DC | PRN

## 2016-10-09 MED ORDER — ALPRAZOLAM 0.5 MG PO TABS
1.0000 mg | ORAL_TABLET | Freq: Two times a day (BID) | ORAL | Status: DC | PRN
  Administered 2016-10-09 – 2016-10-11 (×4): 1 mg via ORAL
  Filled 2016-10-09 (×2): qty 2
  Filled 2016-10-09: qty 4
  Filled 2016-10-09: qty 2

## 2016-10-09 MED ORDER — STROKE: EARLY STAGES OF RECOVERY BOOK
Freq: Once | Status: DC
  Filled 2016-10-09: qty 1

## 2016-10-09 MED ORDER — OXYCODONE HCL ER 10 MG PO T12A
10.0000 mg | EXTENDED_RELEASE_TABLET | Freq: Two times a day (BID) | ORAL | Status: DC
  Administered 2016-10-09 – 2016-10-11 (×5): 10 mg via ORAL
  Filled 2016-10-09 (×5): qty 1

## 2016-10-09 MED ORDER — SODIUM CHLORIDE 0.9 % IV SOLN
INTRAVENOUS | Status: DC
  Administered 2016-10-09: 05:00:00 via INTRAVENOUS

## 2016-10-09 MED ORDER — ENOXAPARIN SODIUM 40 MG/0.4ML ~~LOC~~ SOLN
40.0000 mg | Freq: Every day | SUBCUTANEOUS | Status: DC
  Administered 2016-10-09: 40 mg via SUBCUTANEOUS
  Filled 2016-10-09 (×2): qty 0.4

## 2016-10-09 MED ORDER — INSULIN ASPART 100 UNIT/ML ~~LOC~~ SOLN
0.0000 [IU] | Freq: Three times a day (TID) | SUBCUTANEOUS | Status: DC
  Administered 2016-10-10 – 2016-10-11 (×2): 1 [IU] via SUBCUTANEOUS

## 2016-10-09 MED ORDER — INSULIN ASPART 100 UNIT/ML ~~LOC~~ SOLN: 0.0000 [IU] | Freq: Every day | SUBCUTANEOUS | Status: DC

## 2016-10-09 MED ORDER — ASPIRIN 325 MG PO TABS
325.0000 mg | ORAL_TABLET | Freq: Every day | ORAL | Status: DC
  Administered 2016-10-09 – 2016-10-11 (×3): 325 mg via ORAL
  Filled 2016-10-09 (×3): qty 1

## 2016-10-09 MED ORDER — IOPAMIDOL (ISOVUE-370) INJECTION 76%
INTRAVENOUS | Status: AC
  Administered 2016-10-09: 50 mL
  Filled 2016-10-09: qty 50

## 2016-10-09 MED ORDER — ACETAMINOPHEN 650 MG RE SUPP: 650.0000 mg | RECTAL | Status: DC | PRN

## 2016-10-09 MED ORDER — ATORVASTATIN CALCIUM 10 MG PO TABS
10.0000 mg | ORAL_TABLET | Freq: Every day | ORAL | Status: DC
  Administered 2016-10-09 – 2016-10-11 (×3): 10 mg via ORAL
  Filled 2016-10-09 (×3): qty 1

## 2016-10-09 MED ORDER — LEVOFLOXACIN IN D5W 750 MG/150ML IV SOLN: 750.0000 mg | Freq: Once | INTRAVENOUS | Status: DC

## 2016-10-09 MED ORDER — ALPRAZOLAM 0.5 MG PO TABS: 0.5000 mg | ORAL_TABLET | ORAL | Status: DC

## 2016-10-09 MED ORDER — ASPIRIN 300 MG RE SUPP: 300.0000 mg | Freq: Every day | RECTAL | Status: DC

## 2016-10-09 MED ORDER — ACETAMINOPHEN 160 MG/5ML PO SOLN: 650.0000 mg | ORAL | Status: DC | PRN
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
## 2016-10-09 NOTE — ED Notes (Signed)
Meal tray given to pt.

## 2016-10-09 NOTE — H&P (Signed)
History and Physical    [name] JXB:[id number] DOB: 1960/11/11 DOA: 10/08/2016  Referring MD/NP/PA: Junius Creamer, NP PCP: Vonna Drafts, FNP  Patient coming from: Home via EMS  Chief Complaint: Right-sided weakness   HPI: [name] is a 56 y.o. female with medical history significant of HTN, HLD, DM type II, anxiety, breast cancer in remission; who presented with right-sided weakness subsequently following a fall over one week ago. Patient is a unreliable historian due to her acute condition, and much history is obtained from review of records. Patient reportedly fell or syncopized approximately 11 days ago and since that time has been bedbound. Reports having right-sided weakness of the upper and lower extremities for which she was unable to get up and move. Patient was given food by her significant other. A friend of the patient came by to see her after not hearing from her and several days and found her lying in urine and feces unable to move and called EMS.    ED Course: Patient was brought in as a code stroke and evaluated by neurology. Imaging studies revealed signs of frontoparietal stroke. Evaluation of the patient reveals signs of erythema on the skin of multiple areas of the right side of the patient's body. Labs revealed WBC 15.6 with initial lactic acid 1.92. Sepsis protocol was initiated and patient was started on thank and aztreonam.  Review of Systems  Unable to perform ROS: Mental status change  Neurological: Positive for speech change and focal weakness.     Past Medical History:  Diagnosis Date  . Asthma   . Back muscle spasm 12/13/2010  . Cancer [facility])    breast cancer, remission 2010  . Chronic back pain   . Chronic foot pain   . COPD (chronic obstructive pulmonary disease) ([place]) 09/14/2010  . H/O MRSA infection 09/14/2010  . High blood cholesterol   . Hyperlipidemia 12/07/2012  . Hypertension   . Marijuana abuse in the past 09/14/2010    . Psoriasis 12/13/2010  . Tobacco abuse 12/07/2012    Past Surgical History:  Procedure Laterality Date  . ABDOMINAL HYSTERECTOMY    . CESAREAN SECTION     x7  . MASTECTOMY     left  . MASTECTOMY    . PORT-A-CATH REMOVAL  06/01/2011   Procedure: REMOVAL PORT-A-CATH;  Surgeon: Donato Heinz, MD;  Location: [facility];  Service: General;  Laterality: N/A;  Minor Room  . portacath insertion       reports that she has been smoking.  She has a 9.00 pack-year smoking history. She has never used smokeless tobacco. She reports that she uses drugs, including Marijuana. She reports that she does not drink alcohol.  Allergies  Allergen Reactions  . Codeine Anaphylaxis  . Penicillins Anaphylaxis    Has patient had a PCN reaction causing immediate rash, facial/tongue/throat swelling, SOB or lightheadedness with hypotension: Yes Has patient had a PCN reaction causing severe rash involving mucus membranes or skin necrosis: No Has patient had a PCN reaction that required hospitalization: Yes Has patient had a PCN reaction occurring within the last 10 years: No If all of the above answers are "NO", then may proceed with Cephalosporin use.     Family History  Problem Relation Age of Onset  . Stroke Mother   . Stroke Father   . Cancer Maternal Aunt   . Cancer Maternal Grandmother   . Cancer Maternal Aunt   . Cancer Maternal Aunt     Prior  to Admission medications   Medication Sig Start Date End Date Taking? Authorizing Provider  OxyCODONE ER (XTAMPZA ER) 9 MG C12A Take 9 mg by mouth every 12 (twelve) hours.   Yes [provider]  ALPRAZolam Duanne Moron) 1 MG tablet Take 1 tablet (1 mg total) by mouth 2 (two) times daily as needed. for anxiety Patient taking differently: Take 1 mg by mouth 2 (two) times daily as needed for anxiety.  09/30/16   Holley Bouche, NP  amitriptyline (ELAVIL) 10 MG tablet Take 10 mg by mouth at bedtime.    [provider]  amLODipine (NORVASC) 5 MG  tablet Take 5 mg by mouth daily.    [provider]  aspirin 81 MG chewable tablet Chew 81 mg by mouth daily.    [provider]  Diclofenac Sodium 3 % GEL Apply 2-3 g topically See admin instructions. To affected areas three to four times a day 09/27/16   [provider]  escitalopram (LEXAPRO) 10 MG tablet Take 10 mg by mouth daily.    [provider]  glipiZIDE (GLUCOTROL) 5 MG tablet Take 5 mg by mouth daily.    [provider]  hydrOXYzine (ATARAX/VISTARIL) 50 MG tablet Take 50 mg by mouth daily.     [provider]  lisinopril (PRINIVIL,ZESTRIL) 40 MG tablet Take 40 mg by mouth daily. Reported on 05/06/2015    [provider]  losartan-hydrochlorothiazide (HYZAAR) 100-12.5 MG tablet Take 1 tablet by mouth daily.    [provider]  metFORMIN (GLUCOPHAGE) 500 MG tablet Take 500 mg by mouth 2 (two) times daily with a meal.    [provider]  metoprolol succinate (TOPROL-XL) 50 MG 24 hr tablet Take 50 mg by mouth daily. Take with or immediately following a meal.    [provider]  montelukast (SINGULAIR) 10 MG tablet Take 10 mg by mouth at bedtime.    [provider]  Moxifloxacin HCl (MOXEZA) 0.5 % SOLN Apply to eye.    [provider]  oxyCODONE (ROXICODONE) 15 MG immediate release tablet Take 15 mg by mouth every 4 (four) hours as needed for pain. 07/29/16   [provider]  pravastatin (PRAVACHOL) 20 MG tablet Take 20 mg by mouth daily. Reported on 05/06/2015    [provider]  prochlorperazine (COMPAZINE) 10 MG tablet Take 10 mg by mouth every 6 (six) hours as needed for nausea or vomiting.    [provider]  spironolactone (ALDACTONE) 25 MG tablet Take 25 mg by mouth daily.    [provider]  terbinafine (LAMISIL) 250 MG tablet Take 250 mg by mouth daily.    [provider]    Physical Exam:  Constitutional: Disheveled female who appears  to be in no acute distress. Vitals:   10/08/16 2100 10/08/16 2145 10/08/16 2245 10/08/16 2300  BP: 111/87 126/74 (!) 156/87 138/67  Pulse: 77 73 80 66  Resp: (!) 25 (!) 23 16 17   Temp:      TempSrc:      SpO2:    100%   Eyes: PERRL, lids and conjunctivae normal ENMT: Mucous membranes are dry.. Posterior pharynx clear of any exudate or lesions. Neck: normal, supple, no masses, no thyromegaly Respiratory: clear to auscultation bilaterally, no wheezing, no crackles. Normal respiratory effort. No accessory muscle use.  Cardiovascular: Regular rate and rhythm, no murmurs / rubs / gallops. No extremity edema. 2+ pedal pulses. No carotid bruits.  Abdomen: no tenderness, no masses palpated. No  hepatosplenomegaly. Bowel sounds positive.  Musculoskeletal: no clubbing / cyanosis. No joint deformity upper and lower extremities. Good ROM, no contractures. Normal muscle tone.  Skin: Abrasion of the right fifth digit with surrounding erythema. Erythema noted on much of the right side of the body with macerated skin. Neurologic: CN 2-12 grossly intact. Sensation intact, DTR normal. Strength 3/5 of the right upper extremity, 4/5 of right lower extremity, and 5/5 on the left upper and lower extremity. Mild slurred speech. Psychiatric: Poor judgment and insight. Alert  oriented x 1. Normal mood.     Labs on Admission: I have personally reviewed following labs and imaging studies  CBC:  Recent Labs Lab 10/08/16 1523  WBC 15.6*  HGB 15.4*  HCT 47.5*  MCV 93.5  PLT 132   Basic Metabolic Panel:  Recent Labs Lab 10/08/16 1523  NA 140  K 3.5  CL 101  CO2 28  GLUCOSE 140*  BUN 44*  CREATININE 1.23*  CALCIUM 9.3   GFR: CrCl cannot be calculated (Unknown ideal weight.). Liver Function Tests:  Recent Labs Lab 10/08/16 1523  AST 21  ALT 15  ALKPHOS 76  BILITOT 0.8  PROT 6.7  ALBUMIN 3.1*   No results for input(s): LIPASE, AMYLASE in the last 168 hours. No results for input(s):  AMMONIA in the last 168 hours. Coagulation Profile: No results for input(s): INR, PROTIME in the last 168 hours. Cardiac Enzymes:  Recent Labs Lab 10/08/16 1850  CKTOTAL 195   BNP (last 3 results) No results for input(s): PROBNP in the last 8760 hours. HbA1C: No results for input(s): HGBA1C in the last 72 hours. CBG: No results for input(s): GLUCAP in the last 168 hours. Lipid Profile: No results for input(s): CHOL, HDL, LDLCALC, TRIG, CHOLHDL, LDLDIRECT in the last 72 hours. Thyroid Function Tests: No results for input(s): TSH, T4TOTAL, FREET4, T3FREE, THYROIDAB in the last 72 hours. Anemia Panel: No results for input(s): VITAMINB12, FOLATE, FERRITIN, TIBC, IRON, RETICCTPCT in the last 72 hours. Urine analysis:    Component Value Date/Time   COLORURINE YELLOW 10/08/2016 1939   APPEARANCEUR CLEAR 10/08/2016 1939   LABSPEC 1.025 10/08/2016 1939   PHURINE 5.0 10/08/2016 1939   GLUCOSEU NEGATIVE 10/08/2016 1939   HGBUR MODERATE (A) 10/08/2016 1939   BILIRUBINUR NEGATIVE 10/08/2016 [place] NEGATIVE 10/08/2016 1939   PROTEINUR NEGATIVE 10/08/2016 1939   UROBILINOGEN 0.2 06/01/2011 1541   NITRITE NEGATIVE 10/08/2016 1939   LEUKOCYTESUR NEGATIVE 10/08/2016 1939   Sepsis Labs: No results found for this or any previous visit (from the past 240 hour(s)).   Radiological Exams on Admission: Dg Chest 2 View  Result Date: 10/08/2016 CLINICAL DATA:  Right-sided paralysis.  Syncope. EXAM: CHEST  2 VIEW COMPARISON:  None. FINDINGS: No pneumothorax. The heart and hila are normal. There is a tortuous thoracic aorta which is similar in the interval. The mediastinum is unremarkable. Rounded density over the lateral left lung base is favored represent overlapping soft tissues. A nodule in the lung is considered less likely and this region was normal in May of 2017. Vascular crowding in the medial right lung base. No focal infiltrate identified. No other acute abnormalities. IMPRESSION:  Rounded density over the lateral left lung base is favored to represent overlapping redundant soft tissues. A repeat PA and lateral when the patient can be imaged in a completely erect position could exclude the less likely possibility of a pulmonary nodule. Electronically Signed   By: Dorise Bullion III M.D   On:  10/08/2016 18:09   Ct Head Wo Contrast  Result Date: 10/08/2016 CLINICAL DATA:  Right-sided hemiplegia EXAM: CT HEAD WITHOUT CONTRAST TECHNIQUE: Contiguous axial images were obtained from the base of the skull through the vertex without intravenous contrast. COMPARISON:  March 10, 2016 FINDINGS: Brain: The ventricles are normal in size and configuration. There is no intracranial mass, hemorrhage, extra-axial fluid collection, or midline shift. There is minimal small vessel disease in the centra semiovale bilaterally. Elsewhere gray-white compartments appear normal. No acute infarct is appreciable on this study. Vascular: There is no appreciable hyperdense vessel. There is calcification in each carotid siphon region. There is also calcification in the distal vertebral arteries bilaterally. Skull: Bony calvarium appears intact. Sinuses/Orbits: There is mild mucosal thickening in several ethmoid air cells bilaterally. There is mucosal thickening in the posterior right sphenoid sinus. Other paranasal sinuses are clear. Orbits appear symmetric bilaterally. Other: Mastoid air cells are clear. IMPRESSION: Rather minimal periventricular small vessel disease. No evident acute infarct. No mass, hemorrhage, or extra-axial fluid collection. Somewhat age advanced atherosclerotic arterial vascular calcification. Areas of paranasal sinus disease. Electronically Signed   By: Lowella Grip III M.D.   On: 10/08/2016 17:39   Mr Brain Wo Contrast  Result Date: 10/09/2016 CLINICAL DATA:  Altered mental status. RIGHT-sided hemiplegia. History of hypertension, hyperlipidemia and breast cancer. EXAM: MRI HEAD  WITHOUT CONTRAST MRI CERVICAL SPINE WITHOUT CONTRAST TECHNIQUE: Multiplanar, multiecho pulse sequences of the brain and surrounding structures, and cervical spine, to include the craniocervical junction and cervicothoracic junction, were obtained without intravenous contrast. COMPARISON:  CT HEAD August 18th 2018 at 1725 hours and CT cervical spine October 05, 2012 FINDINGS: MRI HEAD FINDINGS- mildly motion degraded examination. BRAIN: Patchy reduced diffusion bilateral frontoparietal lobes, including deep white matter and, biparietal cortex. Low ADC values. No susceptibility artifact to suggest hemorrhage. The ventricles and sulci are mildly prominent for patient's age. Patchy T2 hyperintense signal exclusive of the aforementioned abnormalities in a nonspecific distribution. Old small RIGHT cerebellar infarct. No suspicious parenchymal signal, mass or mass effect. No abnormal extra-axial fluid collections. VASCULAR: Normal major intracranial vascular flow voids present at skull base. SKULL AND UPPER CERVICAL SPINE: No abnormal sellar expansion. No suspicious calvarial bone marrow signal. Craniocervical junction maintained. SINUSES/ORBITS: The mastoid air-cells and included paranasal sinuses are well-aerated. The included ocular globes and orbital contents are non-suspicious. OTHER: Patient is edentulous. MRI CERVICAL SPINE FINDINGS - motion degraded examination. ALIGNMENT: Straightened cervical lordosis.  No malalignment. VERTEBRAE/DISCS: Vertebral bodies are intact. Intervertebral disc morphology's and signal are normal. Multilevel mild chronic discogenic endplate changes. No abnormal or acute bone marrow signal. CORD:Cervical spinal cord is normal morphology and signal characteristics from the cervicomedullary junction to level of T1-2, the most caudal well visualized level. POSTERIOR FOSSA, VERTEBRAL ARTERIES, PARASPINAL TISSUES: No MR findings of ligamentous injury. Vertebral artery flow voids present. Included  posterior fossa and paraspinal soft tissues are normal. DISC LEVELS (moderately motion degraded axial sequences limit evaluation) C2-3: No disc bulge, canal stenosis nor neural foraminal narrowing. C3-4, C4-5: Uncovertebral hypertrophy and mild facet arthropathy. No canal stenosis or neural foraminal narrowing. C5-6, C6-7: Small broad-based disc bulge. No canal stenosis or neural foraminal narrowing. C7-T1: No disc bulge, canal stenosis nor neural foraminal narrowing. IMPRESSION: MRI HEAD: 1. Numerous acute small frontoparietal infarcts including the small vessel and MCA territories. These may be embolic, seen with proximal stenosis or vasculopathy. Recommend CTA HEAD and neck. 2. Mild chronic small vessel ischemic disease. Borderline parenchymal brain volume loss for age. MRI  CERVICAL SPINE: 1. Motion degraded examination. Early degenerative change of the cervical spine without neurocompression. Electronically Signed   By: Elon Alas M.D.   On: 10/09/2016 00:55   Mr Cervical Spine Wo Contrast  Result Date: 10/09/2016 CLINICAL DATA:  Altered mental status. RIGHT-sided hemiplegia. History of hypertension, hyperlipidemia and breast cancer. EXAM: MRI HEAD WITHOUT CONTRAST MRI CERVICAL SPINE WITHOUT CONTRAST TECHNIQUE: Multiplanar, multiecho pulse sequences of the brain and surrounding structures, and cervical spine, to include the craniocervical junction and cervicothoracic junction, were obtained without intravenous contrast. COMPARISON:  CT HEAD August 18th 2018 at 1725 hours and CT cervical spine October 05, 2012 FINDINGS: MRI HEAD FINDINGS- mildly motion degraded examination. BRAIN: Patchy reduced diffusion bilateral frontoparietal lobes, including deep white matter and, biparietal cortex. Low ADC values. No susceptibility artifact to suggest hemorrhage. The ventricles and sulci are mildly prominent for patient's age. Patchy T2 hyperintense signal exclusive of the aforementioned abnormalities in a  nonspecific distribution. Old small RIGHT cerebellar infarct. No suspicious parenchymal signal, mass or mass effect. No abnormal extra-axial fluid collections. VASCULAR: Normal major intracranial vascular flow voids present at skull base. SKULL AND UPPER CERVICAL SPINE: No abnormal sellar expansion. No suspicious calvarial bone marrow signal. Craniocervical junction maintained. SINUSES/ORBITS: The mastoid air-cells and included paranasal sinuses are well-aerated. The included ocular globes and orbital contents are non-suspicious. OTHER: Patient is edentulous. MRI CERVICAL SPINE FINDINGS - motion degraded examination. ALIGNMENT: Straightened cervical lordosis.  No malalignment. VERTEBRAE/DISCS: Vertebral bodies are intact. Intervertebral disc morphology's and signal are normal. Multilevel mild chronic discogenic endplate changes. No abnormal or acute bone marrow signal. CORD:Cervical spinal cord is normal morphology and signal characteristics from the cervicomedullary junction to level of T1-2, the most caudal well visualized level. POSTERIOR FOSSA, VERTEBRAL ARTERIES, PARASPINAL TISSUES: No MR findings of ligamentous injury. Vertebral artery flow voids present. Included posterior fossa and paraspinal soft tissues are normal. DISC LEVELS (moderately motion degraded axial sequences limit evaluation) C2-3: No disc bulge, canal stenosis nor neural foraminal narrowing. C3-4, C4-5: Uncovertebral hypertrophy and mild facet arthropathy. No canal stenosis or neural foraminal narrowing. C5-6, C6-7: Small broad-based disc bulge. No canal stenosis or neural foraminal narrowing. C7-T1: No disc bulge, canal stenosis nor neural foraminal narrowing. IMPRESSION: MRI HEAD: 1. Numerous acute small frontoparietal infarcts including the small vessel and MCA territories. These may be embolic, seen with proximal stenosis or vasculopathy. Recommend CTA HEAD and neck. 2. Mild chronic small vessel ischemic disease. Borderline parenchymal  brain volume loss for age. MRI CERVICAL SPINE: 1. Motion degraded examination. Early degenerative change of the cervical spine without neurocompression. Electronically Signed   By: Elon Alas M.D.   On: 10/09/2016 00:55   Dg Hand Complete Right  Result Date: 10/08/2016 CLINICAL DATA:  Hand pain following fall, initial encounter EXAM: RIGHT HAND - COMPLETE 3+ VIEW COMPARISON:  None. FINDINGS: There is no evidence of fracture or dislocation. There is no evidence of arthropathy or other focal bone abnormality. Soft tissues are unremarkable. IMPRESSION: No acute abnormality noted. Electronically Signed   By: Inez Catalina M.D.   On: 10/08/2016 17:22    EKG: Independently reviewed. Sinus rhythm with biatrial enlargement  Assessment/Plan Cerebral embolism with cerebral infarction: Acute. Patient found to have a frontoparietal stroke on imaging studies. Neurology consulted and recommended completion of stroke workup. - Admit to telemetry bed - Stroke order set initiated - Neuro checks - PT/OT/Speech to eval and treat - Check echocardiogram - Check Hemoglobin A1c and lipid panel in a.m. - ASA - Appreciate  neurology consultative services, will follow-up  - Social work consult   SIRS/Sepsis 2/2 cellulitis: Acute. Patient presented with tachycardia and tachypnea. Found to have erythema much of the right side of her body and to suggest cellulitis. - Follow-up blood cultures - Continue empiric antibiotics of vancomycin and aztreonam, de-escalate when medically appropriate  Acute kidney injury 2/2 dehydration: Baseline creatinine previously noted to be 0.92. Patient presents with creatinine of 1.23 and a BUN of 44 to suggest prerenal cause of symptoms. - IV fluids normal saline at 100 ml/hour  - Recheck BMP in a.m.   Diabetes mellitus type 2: Patient currently only on oral medications at this time. - Hypoglycemic protocols - Follow-up hemoglobin A1c - Hold metformin and glipizide - CBGs  every before meals and at bedtime with sensitive SSI  Essential hypertension - restart .blood pressure medications when medically appropriate  Chronic pain pain syndrome - Continue home pain regimen of oxycodone extended release and oxycodone as needed   Anxiety - Continue Xanax prn Anxiety  Tobacco abuse - We'll need to counsel the patient on need for cessation of tobacco  DVT prophylaxis: lovenox   Code Status: Full Family Communication: no blood present at bedside Disposition Plan: TBD Consults called: Neurology Admission status: Inpatient   Norval Morton MD [facility] Pager [phone]   If 7PM-7AM, please contact night-coverage [email or web address] Password [email or web address]  10/09/2016, 2:53 AM

## 2016-10-09 NOTE — ED Notes (Signed)
Pt continues to be in MRI  
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
## 2016-10-09 NOTE — Progress Notes (Signed)
S/O: CTA head and neck resulted. 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   CT head: 1. Evolving small acute watershed and RIGHT MCA territory infarcts without hemorrhagic conversion. 2. Old small RIGHT frontal/ACA territory infarct. 3. Mild parenchymal brain volume loss for age. CTA NECK:  CTA neck:  1. 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 Reconstitution proximal to LEFT vertebral artery concerning for steal. 2. Atherosclerosis without hemodynamically significant stenosis carotid artery's. 3. Moderate stenosis RIGHT vertebral artery origin.  CTA HEAD: 1. No emergent large vessel occlusion. 2. Severe stenoses LEFT paraclinoid ICA, LEFT anterior cerebral artery and LEFT middle cerebral artery. 3. Moderate general cerebral artery atherosclerosis and stenoses.  A/R: 1. Severe intracranial atherosclerosis. Will add Plavix to ASA. 2. 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 Will discuss possible indications for clot retrieval procedure with Dr. Estanislado Pandy.   Addendum: Dr. Estanislado Pandy will perform 4-vessel angiogram on the patient tomorrow. Angio has been ordered. NPO after midnight (ordered).   Electronically signed: Dr. Kerney Elbe

## 2016-10-09 NOTE — ED Provider Notes (Signed)
MRI shows multiple small infarcts Spoke with Neurology- Dr. Earnestine Leys who will consult To be admitted to hospital   Junius Creamer, NP 10/09/16 0147 A stroke with Dr. Tamala Julian hospitalists to admit patient   Junius Creamer, NP 10/09/16 0202    Mesner, Corene Cornea, MD 10/09/16 1250

## 2016-10-09 NOTE — Progress Notes (Signed)
Patient seen and evaluated earlier the same by my associate. Please refer to H&P for details regarding assessment and plan.  Neurology on board.  Will continue to monitor  Will reassess next am  Gen: pt in nad, sleeping comfortably CV: no cyanosis Pulm: equal chest rise.  Kire Ferg, [employer]

## 2016-10-10 ENCOUNTER — Encounter (HOSPITAL_COMMUNITY): Payer: Self-pay | Admitting: Radiology

## 2016-10-10 ENCOUNTER — Inpatient Hospital Stay (HOSPITAL_COMMUNITY): Payer: Medicaid Other

## 2016-10-10 DIAGNOSIS — G458 Other transient cerebral ischemic attacks and related syndromes: Secondary | ICD-10-CM

## 2016-10-10 DIAGNOSIS — I638 Other cerebral infarction: Secondary | ICD-10-CM

## 2016-10-10 DIAGNOSIS — I10 Essential (primary) hypertension: Secondary | ICD-10-CM

## 2016-10-10 DIAGNOSIS — I6310 Cerebral infarction due to embolism of unspecified precerebral artery: Secondary | ICD-10-CM

## 2016-10-10 DIAGNOSIS — I639 Cerebral infarction, unspecified: Secondary | ICD-10-CM

## 2016-10-10 HISTORY — PX: IR ANGIOGRAM EXTREMITY LEFT: IMG651

## 2016-10-10 HISTORY — PX: IR ANGIO INTRA EXTRACRAN SEL COM CAROTID INNOMINATE BILAT MOD SED: IMG5360

## 2016-10-10 HISTORY — PX: IR ANGIO VERTEBRAL SEL SUBCLAVIAN INNOMINATE UNI R MOD SED: IMG5365

## 2016-10-10 LAB — CBC
HEMATOCRIT: 39.9 % (ref 36.0–46.0)
HEMOGLOBIN: 13 g/dL (ref 12.0–15.0)
MCH: 30 pg (ref 26.0–34.0)
MCHC: 32.6 g/dL (ref 30.0–36.0)
MCV: 92.1 fL (ref 78.0–100.0)
Platelets: 262 10*3/uL (ref 150–400)
RBC: 4.33 MIL/uL (ref 3.87–5.11)
RDW: 14.6 % (ref 11.5–15.5)
WBC: 9.1 10*3/uL (ref 4.0–10.5)

## 2016-10-10 LAB — PROTIME-INR
INR: 1.2
Prothrombin Time: 15.3 seconds — ABNORMAL HIGH (ref 11.4–15.2)

## 2016-10-10 LAB — POCT I-STAT, CHEM 8
BUN: 17 mg/dL (ref 6–20)
CALCIUM ION: 1.25 mmol/L (ref 1.15–1.40)
CREATININE: 0.7 mg/dL (ref 0.44–1.00)
Chloride: 99 mmol/L — ABNORMAL LOW (ref 101–111)
GLUCOSE: 102 mg/dL — AB (ref 65–99)
HCT: 35 % — ABNORMAL LOW (ref 36.0–46.0)
HEMOGLOBIN: 11.9 g/dL — AB (ref 12.0–15.0)
POTASSIUM: 2.9 mmol/L — AB (ref 3.5–5.1)
Sodium: 140 mmol/L (ref 135–145)
TCO2: 30 mmol/L (ref 0–100)

## 2016-10-10 LAB — RAPID URINE DRUG SCREEN, HOSP PERFORMED
Amphetamines: NOT DETECTED
BARBITURATES: NOT DETECTED
Benzodiazepines: POSITIVE — AB
Cocaine: NOT DETECTED
Opiates: POSITIVE — AB
Tetrahydrocannabinol: NOT DETECTED

## 2016-10-10 LAB — ECHOCARDIOGRAM COMPLETE
Height: 63 in
Weight: 3200 oz

## 2016-10-10 LAB — GLUCOSE, CAPILLARY
GLUCOSE-CAPILLARY: 109 mg/dL — AB (ref 65–99)
Glucose-Capillary: 100 mg/dL — ABNORMAL HIGH (ref 65–99)
Glucose-Capillary: 126 mg/dL — ABNORMAL HIGH (ref 65–99)
Glucose-Capillary: 135 mg/dL — ABNORMAL HIGH (ref 65–99)

## 2016-10-10 LAB — BASIC METABOLIC PANEL
ANION GAP: 6 (ref 5–15)
BUN: 13 mg/dL (ref 6–20)
CO2: 30 mmol/L (ref 22–32)
Calcium: 8.6 mg/dL — ABNORMAL LOW (ref 8.9–10.3)
Chloride: 101 mmol/L (ref 101–111)
Creatinine, Ser: 0.72 mg/dL (ref 0.44–1.00)
GFR calc non Af Amer: >60 mL/min (ref >60)
Glucose, Bld: 179 mg/dL — ABNORMAL HIGH (ref 65–99)
POTASSIUM: 3 mmol/L — AB (ref 3.5–5.1)
Sodium: 137 mmol/L (ref 135–145)

## 2016-10-10 LAB — APTT: APTT: 32 s (ref 24–36)

## 2016-10-10 MED ORDER — MIDAZOLAM HCL 2 MG/2ML IJ SOLN
INTRAMUSCULAR | Status: AC
  Filled 2016-10-10: qty 2

## 2016-10-10 MED ORDER — IOPAMIDOL (ISOVUE-300) INJECTION 61%
INTRAVENOUS | Status: AC
  Administered 2016-10-10: 15 mL
  Filled 2016-10-10: qty 50

## 2016-10-10 MED ORDER — HEPARIN SODIUM (PORCINE) 1000 UNIT/ML IJ SOLN
INTRAMUSCULAR | Status: AC
  Filled 2016-10-10: qty 1

## 2016-10-10 MED ORDER — SODIUM CHLORIDE 0.9 % IV SOLN: INTRAVENOUS | Status: AC

## 2016-10-10 MED ORDER — IOPAMIDOL (ISOVUE-300) INJECTION 61%
INTRAVENOUS | Status: AC
  Administered 2016-10-10: 75 mL
  Filled 2016-10-10: qty 150

## 2016-10-10 MED ORDER — FENTANYL CITRATE (PF) 100 MCG/2ML IJ SOLN
INTRAMUSCULAR | Status: AC
  Filled 2016-10-10: qty 2

## 2016-10-10 MED ORDER — LIDOCAINE HCL (PF) 1 % IJ SOLN
INTRAMUSCULAR | Status: AC
  Filled 2016-10-10: qty 30

## 2016-10-10 MED ORDER — NITROGLYCERIN 1 MG/10 ML FOR IR/CATH LAB
INTRA_ARTERIAL | Status: AC
  Filled 2016-10-10: qty 10

## 2016-10-10 MED ORDER — DIPHENHYDRAMINE HCL 50 MG/ML IJ SOLN
25.0000 mg | Freq: Once | INTRAMUSCULAR | Status: AC
  Administered 2016-10-10: 25 mg via INTRAVENOUS
  Filled 2016-10-10: qty 1

## 2016-10-10 MED ORDER — MIDAZOLAM HCL 2 MG/2ML IJ SOLN
INTRAMUSCULAR | Status: AC | PRN
  Administered 2016-10-10: 0.5 mg via INTRAVENOUS

## 2016-10-10 MED ORDER — POTASSIUM CHLORIDE CRYS ER 20 MEQ PO TBCR: 40.0000 meq | EXTENDED_RELEASE_TABLET | Freq: Once | ORAL | Status: DC

## 2016-10-10 MED ORDER — CLOPIDOGREL BISULFATE 75 MG PO TABS
75.0000 mg | ORAL_TABLET | Freq: Every day | ORAL | Status: DC
  Administered 2016-10-10 – 2016-10-11 (×2): 75 mg via ORAL
  Filled 2016-10-10 (×2): qty 1

## 2016-10-10 MED ORDER — NYSTATIN 100000 UNIT/GM EX POWD
Freq: Three times a day (TID) | CUTANEOUS | Status: DC
  Administered 2016-10-10 – 2016-10-11 (×5): via TOPICAL
  Filled 2016-10-10: qty 15

## 2016-10-10 MED ORDER — HEPARIN SODIUM (PORCINE) 1000 UNIT/ML IJ SOLN
INTRAMUSCULAR | Status: AC | PRN
  Administered 2016-10-10: 1000 [IU] via INTRAVENOUS
  Administered 2016-10-10: 500 [IU] via INTRAVENOUS

## 2016-10-10 MED ORDER — FENTANYL CITRATE (PF) 100 MCG/2ML IJ SOLN
INTRAMUSCULAR | Status: AC | PRN
  Administered 2016-10-10: 25 ug via INTRAVENOUS
  Administered 2016-10-10 (×2): 12.5 ug via INTRAVENOUS

## 2016-10-10 MED ORDER — POTASSIUM CHLORIDE 10 MEQ/100ML IV SOLN
10.0000 meq | INTRAVENOUS | Status: AC
  Administered 2016-10-10 (×3): 10 meq via INTRAVENOUS
  Filled 2016-10-10 (×4): qty 100

## 2016-10-10 MED ORDER — LIDOCAINE HCL 1 % IJ SOLN
INTRAMUSCULAR | Status: AC | PRN
  Administered 2016-10-10: 10 mL

## 2016-10-10 MED ORDER — ENOXAPARIN SODIUM 40 MG/0.4ML ~~LOC~~ SOLN
40.0000 mg | Freq: Every day | SUBCUTANEOUS | Status: DC
  Administered 2016-10-11: 40 mg via SUBCUTANEOUS
  Filled 2016-10-10: qty 0.4

## 2016-10-10 NOTE — Sedation Documentation (Signed)
Patient is resting comfortably. 

## 2016-10-10 NOTE — Progress Notes (Signed)
  Echocardiogram 2D Echocardiogram has been performed.  [name] 10/10/2016, 2:33 PM

## 2016-10-10 NOTE — Progress Notes (Addendum)
LE venous duplex attempted at 9:40 am- patient in IR, 2:10 pm- patient having echo, 2:50 pm- patient on bedpan. Will try again for test later today or tomorrow as time permits.  Vascular lab Landry Mellow, RDMS, RVT 10/10/2016

## 2016-10-10 NOTE — Evaluation (Signed)
Physical Therapy Evaluation Patient Details Name: [name] MRN: [id number] DOB: 03/26/1960 Today's Date: 10/10/2016   History of Present Illness  patient is a 56 yo female who presents with stroke like deficits. 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 Patient s/p IR procedure on 10/10/16.  Clinical Impression  Orders received for PT evaluation. Patient demonstrates deficits in functional mobility as indicated below. Will benefit from continued skilled PT to address deficits and maximize function. Will see as indicated and progress as tolerated.  At this time, patient requires increased physical assist for all aspects of mobility and demonstrates deficits in cognition creating further concerns. At this time, feel patient will need comprehensive therapies to maximize recovery. Recommend CIR consult    Follow Up Recommendations CIR    Equipment Recommendations   (tbd)    Recommendations for Other Services Rehab consult     Precautions / Restrictions Precautions Precautions: Fall      Mobility  Bed Mobility Overal bed mobility: Needs Assistance Bed Mobility: Rolling;Supine to Sit Rolling: Min assist   Supine to sit: Mod assist     General bed mobility comments: moderate assist to elevate trunk and rotate to EOB. multi modal cues for task performance  Transfers Overall transfer level: Needs assistance Equipment used: Rolling walker (2 wheeled) Transfers: Sit to/from Stand Sit to Stand: Min assist         General transfer comment: Max cues for safety and technique to power up and sit. Patient with decrease awareness of environment, attempting to sit prematurely on several ocassions.  Ambulation/Gait Ambulation/Gait assistance: Min assist Ambulation Distance (Feet): 16 Feet Assistive device: Rolling walker (2 wheeled) Gait Pattern/deviations: Step-to pattern;Decreased stride length;Shuffle;Trunk  flexed;Wide base of support     General Gait Details: patient with poor ability to maintain position in RW, hands on physical assist required. Min assist for stability. max cues for positioning  Stairs            Wheelchair Mobility    Modified Rankin (Stroke Patients Only) Modified Rankin (Stroke Patients Only) Pre-Morbid Rankin Score: No symptoms Modified Rankin: Moderately severe disability     Balance Overall balance assessment: Needs assistance   Sitting balance-Leahy Scale: Fair Sitting balance - Comments: able to sit EOb and on toilet with min guard/ supervision   Standing balance support: Bilateral upper extremity supported Standing balance-Leahy Scale: Poor Standing balance comment: reliance on UE support             High level balance activites: Direction changes;Turns High Level Balance Comments: min to moderate assist for in room turns. poor awareness of space             Pertinent Vitals/Pain Pain Assessment: No/denies pain    Home Living Family/patient expects to be discharged to:: Private residence Living Arrangements: Spouse/significant other;Other relatives Available Help at Discharge: Family Type of Home: Mobile home Home Access: Stairs to enter Entrance Stairs-Rails: Can reach both Entrance Stairs-Number of Steps: 4 Home Layout: One level Home Equipment: None      Prior Function Level of Independence: Independent         Comments: patient cares for grandchildren, states that she rides horses and is very independent. Patient questionable historian at times     Hand Dominance   Dominant Hand: Right    Extremity/Trunk Assessment   Upper Extremity Assessment Upper Extremity Assessment: Defer to OT evaluation    Lower Extremity Assessment Lower Extremity Assessment: Difficult to assess due  to impaired cognition;RLE deficits/detail RLE Deficits / Details: noted decreased functional strength and coordination. Patient  tangential and difficulty sequencing strength assessment RLE Coordination: decreased fine motor;decreased gross motor       Communication   Communication: No difficulties  Cognition Arousal/Alertness: Awake/alert Behavior During Therapy: WFL for tasks assessed/performed Overall Cognitive Status: No family/caregiver present to determine baseline cognitive functioning Area of Impairment: Attention;Following commands;Safety/judgement;Awareness;Problem solving                   Current Attention Level: Sustained   Following Commands: Follows one step commands inconsistently;Follows one step commands with increased time Safety/Judgement: Decreased awareness of safety;Decreased awareness of deficits Awareness: Intellectual Problem Solving: Slow processing;Difficulty sequencing;Requires verbal cues;Requires tactile cues;Decreased initiation General Comments: patient with poor insight and awareness, unable to process basic tasks to completion.       General Comments General comments (skin integrity, edema, etc.): noted rash all over legs and body    Exercises     Assessment/Plan    PT Assessment Patient needs continued PT services  PT Problem List Decreased strength;Decreased activity tolerance;Decreased balance;Decreased mobility;Decreased cognition;Decreased safety awareness;Decreased knowledge of precautions;Cardiopulmonary status limiting activity;Decreased skin integrity;Pain       PT Treatment Interventions DME instruction;Gait training;Stair training;Functional mobility training;Therapeutic activities;Therapeutic exercise;Balance training;Neuromuscular re-education;Cognitive remediation;Patient/family education    PT Goals (Current goals can be found in the Care Plan section)  Acute Rehab PT Goals Patient Stated Goal: to take care of the kids PT Goal Formulation: With patient Time For Goal Achievement: 10/24/16 Potential to Achieve Goals: Good    Frequency Min  4X/week   Barriers to discharge        Co-evaluation               AM-PAC PT "6 Clicks" Daily Activity  Outcome Measure Difficulty turning over in bed (including adjusting bedclothes, sheets and blankets)?: Unable Difficulty moving from lying on back to sitting on the side of the bed? : Unable Difficulty sitting down on and standing up from a chair with arms (e.g., wheelchair, bedside commode, etc,.)?: Unable Help needed moving to and from a bed to chair (including a wheelchair)?: A Lot Help needed walking in hospital room?: A Little Help needed climbing 3-5 steps with a railing? : A Lot 6 Click Score: 10    End of Session Equipment Utilized During Treatment: Gait belt Activity Tolerance: Patient limited by fatigue Patient left: in chair;with call bell/phone within reach;with chair alarm set Nurse Communication: Mobility status PT Visit Diagnosis: Difficulty in walking, not elsewhere classified (R26.2);Other symptoms and signs involving the nervous system (R29.898);Hemiplegia and hemiparesis Hemiplegia - Right/Left: Right Hemiplegia - dominant/non-dominant: Dominant Hemiplegia - caused by: Cerebral infarction    Time: 7017-7939 PT Time Calculation (min) (ACUTE ONLY): 22 min   Charges:   PT Evaluation $PT Eval Moderate Complexity: 1 Mod     PT G Codes:        Alben Deeds, PT DPT  Board Certified Neurologic Specialist [place] 10/10/2016, 4:54 PM

## 2016-10-10 NOTE — Consult Note (Signed)
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 disc morphology's and signal are normal. Multilevel mild chronic discogenic endplate changes. No abnormal or acute bone marrow signal. CORD:Cervical spinal cord is normal morphology and signal characteristics from the cervicomedullary junction to level of T1-2, the most caudal well visualized level. POSTERIOR FOSSA, VERTEBRAL ARTERIES, PARASPINAL TISSUES: No MR findings of ligamentous injury. Vertebral artery flow voids present. Included posterior fossa and paraspinal soft tissues are normal. DISC LEVELS (moderately motion degraded axial sequences limit evaluation) C2-3: No disc bulge, canal stenosis nor neural foraminal narrowing. C3-4, C4-5: Uncovertebral hypertrophy and mild facet arthropathy. No canal stenosis or neural foraminal narrowing. C5-6, C6-7: Small broad-based disc bulge. No canal stenosis or neural foraminal narrowing. C7-T1: No disc bulge, canal stenosis nor neural foraminal narrowing. IMPRESSION: MRI HEAD:  1. Numerous acute small frontoparietal infarcts including the small vessel and MCA territories. These may be embolic, seen with proximal stenosis or vasculopathy. Recommend CTA HEAD and neck. 2. Mild chronic small vessel ischemic disease. Borderline parenchymal brain volume loss for age. MRI CERVICAL SPINE: 1. Motion degraded examination. Early degenerative change of the cervical spine without neurocompression. Electronically Signed   By: Elon Alas M.D.   On: 10/09/2016 00:55   Dg Hand Complete Right  Result Date: 10/08/2016 CLINICAL DATA:  Hand pain following fall, initial encounter EXAM: RIGHT HAND - COMPLETE 3+ VIEW COMPARISON:  None. FINDINGS: There is no evidence of fracture or dislocation. There is no evidence of arthropathy or other focal bone abnormality. Soft tissues are unremarkable. IMPRESSION: No acute abnormality noted. Electronically Signed   By: Inez Catalina M.D.   On: 10/08/2016 17:22    Labs:  CBC:  Recent Labs  03/10/16 1714 10/08/16 1523 10/10/16 0651  WBC 9.0 15.6* 9.1  HGB 14.0 15.4* 13.0  HCT 43.7 47.5* 39.9  PLT 212 286 262    COAGS: No results for input(s): INR, APTT in the last 8760 hours.  BMP:  Recent Labs  03/10/16 1714 10/08/16 1523  NA 140 140  K 4.3 3.5  CL 105 101  CO2 27 28  GLUCOSE 131* 140*  BUN 15 44*  CALCIUM 9.5 9.3  CREATININE 0.92 1.23*  GFRNONAA >60 49*  GFRAA >60 57*    LIVER FUNCTION TESTS:  Recent Labs  03/10/16 1714 10/08/16 1523  BILITOT 0.6 0.8  AST 17 21  ALT 12* 15  ALKPHOS 75 76  PROT 6.8 6.7  ALBUMIN 3.8 3.1*    TUMOR MARKERS: No results for input(s): AFPTM, CEA, CA199, CHROMGRNA in the last 8760 hours.  Assessment and Plan:  CVA L Subclavian artery thrombosis Scheduled for cerebral arteriogram Risks and benefits discussed with the patient including, but not limited to bleeding, infection, vascular injury, contrast induced renal failure, stroke or even death. All of the patient's questions  were answered, patient is agreeable to proceed. Consent signed and in chart.  Thank you for this interesting consult.  I greatly enjoyed meeting [name] and look forward to participating in their care.  A copy of this report was sent to the requesting provider on this date.  Electronically Signed: Lavonia Drafts, PA-C 10/10/2016, 7:35 AM   I spent a total of 40 Minutes    in face to face in clinical consultation, greater than 50% of which was counseling/coordinating care for cerebral arteriogram

## 2016-10-10 NOTE — Procedures (Signed)
S/P 4 vessel cerebral arteriogram RT CFA approach. Findings. 1..Approx 70 % stenosis  RT MCA prox M1 seg. 2.Approx 50 % stenosis of RT VA origin. 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 5.50 to 70 Stenosis of both ACAs prox.

## 2016-10-10 NOTE — Progress Notes (Signed)
PT Cancellation Note  Patient Details Name: JAME SEELIG MRN: [id number] DOB: 09-21-1960   Cancelled Treatment:    Reason Eval/Treat Not Completed: Patient at procedure or test/unavailable (Interventional Radiology )   Duncan Dull 10/10/2016, 8:56 AM
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
## 2016-10-10 NOTE — Therapy (Signed)
OT Cancellation Note  Patient Details Name: [name] MRN: [id number] DOB: 1960/07/08   Cancelled Treatment:    Reason Eval/Treat Not Completed: Patient at procedure or test/ unavailable. Pt on bedrest from IR.   [name], [place] #[phone]  [name] 10/10/2016, 2:19 PM

## 2016-10-10 NOTE — Progress Notes (Signed)
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   Topical TID  . oxyCODONE  10 mg Oral Q12H   Continuous Infusions: . sodium chloride Stopped (10/09/16 1322)  . aztreonam Stopped (10/10/16 1412)  . vancomycin Stopped (10/10/16 1426)     LOS: 1 day    Time spent: > 35 minutes  Velvet Bathe, MD [facility] Pager [phone]  If 7PM-7AM, please contact night-coverage [email or web address] Password [email or web address] 10/10/2016, 5:30 PM

## 2016-10-10 NOTE — Sedation Documentation (Signed)
Medicated for arm pain

## 2016-10-10 NOTE — Progress Notes (Signed)
*  PRELIMINARY RESULTS* Vascular Ultrasound Lower extremity venous duplex has been completed.  Preliminary findings: No evidence of DVT or baker's cyst.

## 2016-10-11 ENCOUNTER — Encounter (HOSPITAL_COMMUNITY): Payer: Self-pay | Admitting: Interventional Radiology

## 2016-10-11 ENCOUNTER — Inpatient Hospital Stay (HOSPITAL_COMMUNITY)
Admission: RE | Admit: 2016-10-11 | Discharge: 2016-10-15 | DRG: 057 | Disposition: A | Discharge status: Home / Self Care | Payer: Medicaid Other | Source: Intra-hospital | Attending: Physical Medicine & Rehabilitation | Admitting: Physical Medicine & Rehabilitation

## 2016-10-11 DIAGNOSIS — M549 Dorsalgia, unspecified: Secondary | ICD-10-CM | POA: Diagnosis present

## 2016-10-11 DIAGNOSIS — Z6836 Body mass index (BMI) 36.0-36.9, adult: Secondary | ICD-10-CM

## 2016-10-11 DIAGNOSIS — I10 Essential (primary) hypertension: Secondary | ICD-10-CM | POA: Diagnosis present

## 2016-10-11 DIAGNOSIS — A4902 Methicillin resistant Staphylococcus aureus infection, unspecified site: Secondary | ICD-10-CM

## 2016-10-11 DIAGNOSIS — J410 Simple chronic bronchitis: Secondary | ICD-10-CM

## 2016-10-11 DIAGNOSIS — E1142 Type 2 diabetes mellitus with diabetic polyneuropathy: Secondary | ICD-10-CM | POA: Diagnosis present

## 2016-10-11 DIAGNOSIS — E785 Hyperlipidemia, unspecified: Secondary | ICD-10-CM | POA: Diagnosis present

## 2016-10-11 DIAGNOSIS — Z7982 Long term (current) use of aspirin: Secondary | ICD-10-CM

## 2016-10-11 DIAGNOSIS — J449 Chronic obstructive pulmonary disease, unspecified: Secondary | ICD-10-CM | POA: Diagnosis present

## 2016-10-11 DIAGNOSIS — Z809 Family history of malignant neoplasm, unspecified: Secondary | ICD-10-CM | POA: Diagnosis not present

## 2016-10-11 DIAGNOSIS — Z885 Allergy status to narcotic agent status: Secondary | ICD-10-CM | POA: Diagnosis not present

## 2016-10-11 DIAGNOSIS — F411 Generalized anxiety disorder: Secondary | ICD-10-CM

## 2016-10-11 DIAGNOSIS — J42 Unspecified chronic bronchitis: Secondary | ICD-10-CM | POA: Diagnosis not present

## 2016-10-11 DIAGNOSIS — Z880 Allergy status to penicillin: Secondary | ICD-10-CM

## 2016-10-11 DIAGNOSIS — Z90710 Acquired absence of both cervix and uterus: Secondary | ICD-10-CM | POA: Diagnosis not present

## 2016-10-11 DIAGNOSIS — Z9012 Acquired absence of left breast and nipple: Secondary | ICD-10-CM

## 2016-10-11 DIAGNOSIS — E1169 Type 2 diabetes mellitus with other specified complication: Secondary | ICD-10-CM | POA: Diagnosis present

## 2016-10-11 DIAGNOSIS — E669 Obesity, unspecified: Secondary | ICD-10-CM

## 2016-10-11 DIAGNOSIS — Z79899 Other long term (current) drug therapy: Secondary | ICD-10-CM

## 2016-10-11 DIAGNOSIS — Z853 Personal history of malignant neoplasm of breast: Secondary | ICD-10-CM | POA: Diagnosis not present

## 2016-10-11 DIAGNOSIS — F419 Anxiety disorder, unspecified: Secondary | ICD-10-CM | POA: Diagnosis present

## 2016-10-11 DIAGNOSIS — I638 Other cerebral infarction: Secondary | ICD-10-CM

## 2016-10-11 DIAGNOSIS — F17210 Nicotine dependence, cigarettes, uncomplicated: Secondary | ICD-10-CM | POA: Diagnosis present

## 2016-10-11 DIAGNOSIS — I69351 Hemiplegia and hemiparesis following cerebral infarction affecting right dominant side: Secondary | ICD-10-CM | POA: Diagnosis present

## 2016-10-11 DIAGNOSIS — L02224 Furuncle of groin: Secondary | ICD-10-CM

## 2016-10-11 DIAGNOSIS — Z823 Family history of stroke: Secondary | ICD-10-CM

## 2016-10-11 DIAGNOSIS — I6389 Other cerebral infarction: Secondary | ICD-10-CM

## 2016-10-11 DIAGNOSIS — G8929 Other chronic pain: Secondary | ICD-10-CM | POA: Diagnosis present

## 2016-10-11 LAB — GLUCOSE, CAPILLARY
GLUCOSE-CAPILLARY: 140 mg/dL — AB (ref 65–99)
GLUCOSE-CAPILLARY: 95 mg/dL (ref 65–99)
Glucose-Capillary: 104 mg/dL — ABNORMAL HIGH (ref 65–99)
Glucose-Capillary: 98 mg/dL (ref 65–99)

## 2016-10-11 LAB — CBC
HEMATOCRIT: 34.6 % — AB (ref 36.0–46.0)
Hemoglobin: 11.4 g/dL — ABNORMAL LOW (ref 12.0–15.0)
MCH: 30 pg (ref 26.0–34.0)
MCHC: 32.9 g/dL (ref 30.0–36.0)
MCV: 91.1 fL (ref 78.0–100.0)
PLATELETS: 236 10*3/uL (ref 150–400)
RBC: 3.8 MIL/uL — ABNORMAL LOW (ref 3.87–5.11)
RDW: 14.5 % (ref 11.5–15.5)
WBC: 8.2 10*3/uL (ref 4.0–10.5)

## 2016-10-11 LAB — CREATININE, SERUM: CREATININE: 0.76 mg/dL (ref 0.44–1.00)

## 2016-10-11 MED ORDER — ASPIRIN 325 MG PO TABS: 325.0000 mg | ORAL_TABLET | Freq: Every day | ORAL | 0 refills | Status: DC

## 2016-10-11 MED ORDER — SORBITOL 70 % SOLN
30.0000 mL | Freq: Every day | Status: DC | PRN
  Administered 2016-10-12 – 2016-10-14 (×3): 30 mL via ORAL
  Filled 2016-10-11 (×3): qty 30

## 2016-10-11 MED ORDER — ENOXAPARIN SODIUM 40 MG/0.4ML ~~LOC~~ SOLN: 40.0000 mg | Freq: Every day | SUBCUTANEOUS | Status: DC

## 2016-10-11 MED ORDER — ENOXAPARIN SODIUM 40 MG/0.4ML ~~LOC~~ SOLN
40.0000 mg | Freq: Every day | SUBCUTANEOUS | Status: DC
  Administered 2016-10-12 – 2016-10-15 (×4): 40 mg via SUBCUTANEOUS
  Filled 2016-10-11 (×4): qty 0.4

## 2016-10-11 MED ORDER — OXYCODONE HCL ER 10 MG PO T12A
10.0000 mg | EXTENDED_RELEASE_TABLET | Freq: Two times a day (BID) | ORAL | Status: DC
  Administered 2016-10-11 – 2016-10-15 (×8): 10 mg via ORAL
  Filled 2016-10-11 (×8): qty 1

## 2016-10-11 MED ORDER — ASPIRIN 325 MG PO TABS
325.0000 mg | ORAL_TABLET | Freq: Every day | ORAL | Status: DC
  Administered 2016-10-12 – 2016-10-15 (×4): 325 mg via ORAL
  Filled 2016-10-11 (×4): qty 1

## 2016-10-11 MED ORDER — INSULIN ASPART 100 UNIT/ML ~~LOC~~ SOLN: 0.0000 [IU] | Freq: Every day | SUBCUTANEOUS | Status: DC

## 2016-10-11 MED ORDER — NYSTATIN 100000 UNIT/GM EX POWD
Freq: Three times a day (TID) | CUTANEOUS | Status: DC
Start: 2016-10-11 — End: 2016-10-15
  Administered 2016-10-11 – 2016-10-15 (×10): via TOPICAL
  Filled 2016-10-11: qty 15

## 2016-10-11 MED ORDER — ATORVASTATIN CALCIUM 10 MG PO TABS
10.0000 mg | ORAL_TABLET | Freq: Every day | ORAL | Status: DC
  Administered 2016-10-12 – 2016-10-14 (×3): 10 mg via ORAL
  Filled 2016-10-11 (×3): qty 1

## 2016-10-11 MED ORDER — CLOPIDOGREL BISULFATE 75 MG PO TABS: 75.0000 mg | ORAL_TABLET | Freq: Every day | ORAL | 0 refills | Status: DC

## 2016-10-11 MED ORDER — ONDANSETRON HCL 4 MG PO TABS
4.0000 mg | ORAL_TABLET | Freq: Four times a day (QID) | ORAL | Status: DC | PRN
  Administered 2016-10-13 – 2016-10-14 (×3): 4 mg via ORAL
  Filled 2016-10-11 (×3): qty 1

## 2016-10-11 MED ORDER — ONDANSETRON HCL 4 MG/2ML IJ SOLN: 4.0000 mg | Freq: Four times a day (QID) | INTRAMUSCULAR | Status: DC | PRN

## 2016-10-11 MED ORDER — ATORVASTATIN CALCIUM 10 MG PO TABS: 10.0000 mg | ORAL_TABLET | Freq: Every day | ORAL | 0 refills | Status: DC

## 2016-10-11 MED ORDER — SENNOSIDES-DOCUSATE SODIUM 8.6-50 MG PO TABS
1.0000 | ORAL_TABLET | Freq: Every evening | ORAL | Status: DC | PRN
  Administered 2016-10-12 – 2016-10-14 (×2): 1 via ORAL
  Filled 2016-10-11 (×2): qty 1

## 2016-10-11 MED ORDER — ACETAMINOPHEN 160 MG/5ML PO SOLN: 650.0000 mg | ORAL | Status: DC | PRN

## 2016-10-11 MED ORDER — ACETAMINOPHEN 325 MG PO TABS: 650.0000 mg | ORAL_TABLET | ORAL | Status: DC | PRN

## 2016-10-11 MED ORDER — ASPIRIN 300 MG RE SUPP: 300.0000 mg | Freq: Every day | RECTAL | Status: DC

## 2016-10-11 MED ORDER — CLOPIDOGREL BISULFATE 75 MG PO TABS
75.0000 mg | ORAL_TABLET | Freq: Every day | ORAL | Status: DC
  Administered 2016-10-12 – 2016-10-15 (×4): 75 mg via ORAL
  Filled 2016-10-11 (×4): qty 1

## 2016-10-11 MED ORDER — ACETAMINOPHEN 650 MG RE SUPP: 650.0000 mg | RECTAL | Status: DC | PRN

## 2016-10-11 MED ORDER — OXYCODONE HCL 5 MG PO TABS
15.0000 mg | ORAL_TABLET | ORAL | Status: DC | PRN
  Administered 2016-10-12 – 2016-10-14 (×9): 15 mg via ORAL
  Filled 2016-10-11 (×9): qty 3

## 2016-10-11 MED ORDER — ALPRAZOLAM 0.5 MG PO TABS
1.0000 mg | ORAL_TABLET | Freq: Two times a day (BID) | ORAL | Status: DC | PRN
  Administered 2016-10-11 – 2016-10-14 (×7): 1 mg via ORAL
  Filled 2016-10-11 (×7): qty 2

## 2016-10-11 MED ORDER — METFORMIN HCL 500 MG PO TABS: 500.0000 mg | ORAL_TABLET | Freq: Two times a day (BID) | ORAL | Status: DC

## 2016-10-11 NOTE — Progress Notes (Signed)
Physical Therapy Treatment Patient Details Name: [name] MRN: [id number] DOB: [birth year] Today's Date: 10/11/2016    History of Present Illness patient is a 56 yo female who presents with stroke like deficits. 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 Patient s/p IR procedure on 10/10/16.    PT Comments    Patient continues to demonstrate weakness (R > L) and balance and cognitive deficits increasing risk for falls. Pt did tolerate increased gait this session. Continue to recommend CIR for further skilled PT services to maximize independence and safety with mobility.   Follow Up Recommendations  CIR     Equipment Recommendations   (tbd)    Recommendations for Other Services Rehab consult     Precautions / Restrictions Precautions Precautions: Fall    Mobility  Bed Mobility Overal bed mobility: Needs Assistance Bed Mobility: Rolling;Sidelying to Sit Rolling: Min guard Sidelying to sit: Min assist       General bed mobility comments: cues for sequencing; assist to elevate trunk into sitting  Transfers Overall transfer level: Needs assistance Equipment used: Rolling walker (2 wheeled) Transfers: Sit to/from Stand Sit to Stand: Min assist         General transfer comment: cues for safety; pt began sitting prematurely and required assist to safely sit onto chair   Ambulation/Gait Ambulation/Gait assistance: Min assist Ambulation Distance (Feet): 70 Feet Assistive device: Rolling walker (2 wheeled) Gait Pattern/deviations: Step-to pattern;Decreased stride length;Trunk flexed;Wide base of support;Step-through pattern;Decreased step length - right;Decreased dorsiflexion - right;Shuffle Gait velocity: decreased   General Gait Details: mod cues for posture, increased bilat step length, and safe use of AD; pt with step through pattern initially but when fatigued began using step to pattern;  standing rest break required; assist to steady and manage RW; pt required extra time and assist to turn around    Stairs            Wheelchair Mobility    Modified Rankin (Stroke Patients Only) Modified Rankin (Stroke Patients Only) Pre-Morbid Rankin Score: No symptoms Modified Rankin: Moderately severe disability     Balance Overall balance assessment: Needs assistance Sitting-balance support: Feet supported;Bilateral upper extremity supported Sitting balance-Leahy Scale: Fair     Standing balance support: Bilateral upper extremity supported Standing balance-Leahy Scale: Poor Standing balance comment: reliance on UE support                            Cognition Arousal/Alertness: Awake/alert Behavior During Therapy: WFL for tasks assessed/performed Overall Cognitive Status: Impaired/Different from baseline Area of Impairment: Attention;Following commands;Safety/judgement;Awareness;Problem solving                   Current Attention Level: Sustained   Following Commands: Follows one step commands inconsistently;Follows one step commands with increased time Safety/Judgement: Decreased awareness of safety;Decreased awareness of deficits Awareness: Intellectual Problem Solving: Slow processing;Difficulty sequencing;Requires verbal cues;Requires tactile cues;Decreased initiation General Comments: easily distracted; pt required frequent vc for redirecting to task at hand and following through with tasks; pt a little impulsive at times       Exercises      General Comments General comments (skin integrity, edema, etc.): rash multiple areas of body      Pertinent Vitals/Pain Pain Assessment: Faces Faces Pain Scale: Hurts little more Pain Location: catheter Pain Descriptors / Indicators: Burning Pain Intervention(s): Monitored during session;Repositioned    Home Living  Prior Function            PT Goals (current  goals can now be found in the care plan section) Acute Rehab PT Goals PT Goal Formulation: With patient Time For Goal Achievement: 10/24/16 Potential to Achieve Goals: Good Progress towards PT goals: Progressing toward goals    Frequency    Min 4X/week      PT Plan Current plan remains appropriate    Co-evaluation              AM-PAC PT "6 Clicks" Daily Activity  Outcome Measure  Difficulty turning over in bed (including adjusting bedclothes, sheets and blankets)?: Unable Difficulty moving from lying on back to sitting on the side of the bed? : Unable Difficulty sitting down on and standing up from a chair with arms (e.g., wheelchair, bedside commode, etc,.)?: Unable Help needed moving to and from a bed to chair (including a wheelchair)?: A Lot Help needed walking in hospital room?: A Little Help needed climbing 3-5 steps with a railing? : A Lot 6 Click Score: 10    End of Session Equipment Utilized During Treatment: Gait belt Activity Tolerance: Patient tolerated treatment well Patient left: in chair;with call bell/phone within reach;with chair alarm set;with family/visitor present Nurse Communication: Mobility status PT Visit Diagnosis: Difficulty in walking, not elsewhere classified (R26.2);Other symptoms and signs involving the nervous system (R29.898);Hemiplegia and hemiparesis Hemiplegia - Right/Left: Right Hemiplegia - dominant/non-dominant: Dominant Hemiplegia - caused by: Cerebral infarction     Time: 1100-1131 PT Time Calculation (min) (ACUTE ONLY): 31 min  Charges:  $Gait Training: 8-22 mins $Therapeutic Activity: 8-22 mins                    G Codes:       Earney Navy, PTA Pager: [phone]     Darliss Cheney 10/11/2016, 12:29 PM

## 2016-10-11 NOTE — H&P (Signed)
Physical Medicine and Rehabilitation Admission H&P    Chief Complaint  Patient presents with  . Altered Mental Status  : HPI: [name] is a 56 y.o. right handed female with history of hypertension, diabetes mellitus, breast cancer in remission, chronic back pain, tobacco abuse. Presented 10/09/2016 with right side weakness, slurred speech as well as recent fall and had been essentially bedbound for the past week or so. Per chart review patient lives with boyfriend. One level home for steps to entry. Independent prior to admission. Cranial CT scan showed no evidence of acute infarction. Blood pressure 88/40. Urine drug screen positive for opiates and benzos. MRI of the brain showed numerous acute small frontoparietal infarcts including the small vessel and MCA territories. MRI cervical spine without neurocompression. CT angiogram of head and neck showed no emergent large vessel occlusion. 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 Lower extremity Dopplers negative. Patient did not receive TPA. Cerebral angiogram 10/10/2016 per interventional radiology showed approximately 70% stenosis right MCA proximal M1 segment. Approximately 50% stenosis of right VA origin. Echocardiogram with ejection fraction 65% no wall motion abnormalities. Presently on aspirin and Plavix for CVA prophylaxis. Subcutaneous Lovenox for DVT prophylaxis. Physical and occupational therapy evaluations completed with recommendations of physical medicine rehabilitation consult.Patient was admitted for a comprehensive rehabilitation program  Review of Systems  Constitutional: Negative for chills and fever.  HENT: Negative for hearing loss.   Eyes: Negative for blurred vision and double vision.  Respiratory: Positive for shortness of breath.   Cardiovascular: Positive for leg swelling. Negative for chest pain and palpitations.  Gastrointestinal: Positive for  constipation. Negative for nausea.  Genitourinary: Negative for dysuria and hematuria.  Musculoskeletal: Positive for back pain.  Skin: Negative for rash.  Neurological: Positive for speech change and focal weakness. Negative for seizures.  Psychiatric/Behavioral:       Anxiety  All other systems reviewed and are negative.  Past Medical History:  Diagnosis Date  . Asthma   . Back muscle spasm 12/13/2010  . Cancer [facility])    breast cancer, remission 2010  . Chronic back pain   . Chronic foot pain   . COPD (chronic obstructive pulmonary disease) ([facility]) 09/14/2010  . H/O MRSA infection 09/14/2010  . High blood cholesterol   . Hyperlipidemia 12/07/2012  . Hypertension   . 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 Psoriasis 12/13/2010  . Tobacco abuse 12/07/2012   Past Surgical History:  Procedure Laterality Date  . ABDOMINAL HYSTERECTOMY    . CESAREAN SECTION     x7  . IR ANGIO INTRA EXTRACRAN SEL COM CAROTID INNOMINATE BILAT MOD SED  10/10/2016  . IR ANGIO VERTEBRAL SEL SUBCLAVIAN INNOMINATE UNI R MOD SED  10/10/2016  . IR ANGIOGRAM EXTREMITY LEFT  10/10/2016  . MASTECTOMY     left  . MASTECTOMY    . PORT-A-CATH REMOVAL  06/01/2011   Procedure: REMOVAL PORT-A-CATH;  Surgeon: Donato Heinz, MD;  Location: [facility];  Service: General;  Laterality: N/A;  Minor Room  . portacath insertion     Family History  Problem Relation Age of Onset  . Stroke Mother   . Stroke Father   . Cancer Maternal Aunt   . Cancer Maternal Grandmother   . Cancer Maternal Aunt   . Cancer Maternal Aunt    Social History:  reports that she has been smoking.  She has a 9.00 pack-year smoking history. She has never used smokeless  tobacco. She reports that she uses drugs, including Marijuana. She reports that she does not drink alcohol. Allergies:  Allergies  Allergen Reactions  . Codeine Anaphylaxis  . Penicillins Anaphylaxis and Other (See Comments)    Has patient had a PCN reaction causing immediate  rash, facial/tongue/throat swelling, SOB or lightheadedness with hypotension: Yes Has patient had a PCN reaction causing severe rash involving mucus membranes or skin necrosis: No Has patient had a PCN reaction that required hospitalization: Yes Has patient had a PCN reaction occurring within the last 10 years: No If all of the above answers are "NO", then may proceed with Cephalosporin use.    Medications Prior to Admission  Medication Sig Dispense Refill  . ALPRAZolam (XANAX) 1 MG tablet Take 1 tablet (1 mg total) by mouth 2 (two) times daily as needed. for anxiety (Patient taking differently: Take 1 mg by mouth 2 (two) times daily as needed for anxiety. ) 60 tablet 5  . amLODipine (NORVASC) 5 MG tablet Take 5 mg by mouth daily.    Marland Kitchen aspirin 81 MG chewable tablet Chew 81 mg by mouth daily.    . diclofenac sodium (VOLTAREN) 1 % GEL Apply 2-4 g topically 4 (four) times daily.    . Ipratropium-Albuterol (COMBIVENT RESPIMAT) 20-100 MCG/ACT AERS respimat Inhale 1-2 puffs into the lungs every 4 (four) hours as needed for wheezing or shortness of breath.    Marland Kitchen ipratropium-albuterol (DUONEB) 0.5-2.5 (3) MG/3ML SOLN Take 3 mLs by nebulization 2 (two) times daily.    Marland Kitchen losartan-hydrochlorothiazide (HYZAAR) 100-12.5 MG tablet Take 1 tablet by mouth daily.    . metFORMIN (GLUCOPHAGE) 500 MG tablet Take 500 mg by mouth 2 (two) times daily with a meal.    . metoprolol succinate (TOPROL-XL) 50 MG 24 hr tablet Take 50 mg by mouth daily. Take with or immediately following a meal.    . Olopatadine HCl (PATADAY) 0.2 % SOLN Place 1 drop into both eyes daily.    Marland Kitchen oxyCODONE (ROXICODONE) 15 MG immediate release tablet Take 15 mg by mouth every 4 (four) hours as needed for pain.  0  . OxyCODONE ER (XTAMPZA ER) 9 MG C12A Take 9 mg by mouth every 12 (twelve) hours.    . terbinafine (LAMISIL) 250 MG tablet Take 250 mg by mouth daily.    Marland Kitchen zolpidem (AMBIEN) 10 MG tablet Take 10 mg by mouth at bedtime.    .  prochlorperazine (COMPAZINE) 10 MG tablet Take 10 mg by mouth every 6 (six) hours as needed for nausea or vomiting.      Home: Home Living Family/patient expects to be discharged to:: Private residence Living Arrangements: Spouse/significant other, Other relatives Available Help at Discharge: Family Type of Home: Mobile home Home Access: Stairs to enter Entrance Stairs-Number of Steps: 4 Entrance Stairs-Rails: Can reach both [place]: One level Bathroom Shower/Tub: Chiropodist: Standard Home Equipment: None   Functional History: Prior Function Level of Independence: Independent Comments: patient cares for grandchildren, states that she rides horses and is very independent. Patient questionable historian at times  Functional Status:  Mobility: Bed Mobility Overal bed mobility: Needs Assistance Bed Mobility: Rolling, Sidelying to Sit Rolling: Min guard Sidelying to sit: Min assist Supine to sit: Mod assist General bed mobility comments: cues for sequencing; assist to elevate trunk into sitting Transfers Overall transfer level: Needs assistance Equipment used: Rolling walker (2 wheeled) Transfers: Sit to/from Stand Sit to Stand: Min assist General transfer comment: cues for safety; pt began sitting  prematurely and required assist to safely sit onto chair  Ambulation/Gait Ambulation/Gait assistance: Min assist Ambulation Distance (Feet): 70 Feet Assistive device: Rolling walker (2 wheeled) Gait Pattern/deviations: Step-to pattern, Decreased stride length, Trunk flexed, Wide base of support, Step-through pattern, Decreased step length - right, Decreased dorsiflexion - right, Shuffle General Gait Details: mod cues for posture, increased bilat step length, and safe use of AD; pt with step through pattern initially but when fatigued began using step to pattern; standing rest break required; assist to steady and manage RW; pt required extra time and assist to  turn around  Gait velocity: decreased    ADL:    Cognition: Cognition Overall Cognitive Status: Impaired/Different from baseline Orientation Level: Oriented X4 Cognition Arousal/Alertness: Awake/alert Behavior During Therapy: WFL for tasks assessed/performed Overall Cognitive Status: Impaired/Different from baseline Area of Impairment: Attention, Following commands, Safety/judgement, Awareness, Problem solving Current Attention Level: Sustained Following Commands: Follows one step commands inconsistently, Follows one step commands with increased time Safety/Judgement: Decreased awareness of safety, Decreased awareness of deficits Awareness: Intellectual Problem Solving: Slow processing, Difficulty sequencing, Requires verbal cues, Requires tactile cues, Decreased initiation General Comments: easily distracted; pt required frequent vc for redirecting to task at hand and following through with tasks; pt a little impulsive at times   Physical Exam: Blood pressure (!) 162/81, pulse 80, temperature 98.4 F (36.9 C), temperature source Oral, resp. rate 20, height 5' 3"  (1.6 m), weight 90.7 kg (200 lb), SpO2 98 %. Physical Exam  Vitals reviewed. HENT:  Head: Normocephalic and atraumatic.  edentulous  Eyes: EOM are normal.  Neck: Normal range of motion. Neck supple. No tracheal deviation present. No thyromegaly present.  Cardiovascular: Normal rate, regular rhythm and normal heart sounds.  Exam reveals no friction rub.   No murmur heard. Respiratory: Effort normal and breath sounds normal. No respiratory distress. She has no wheezes. She has no rales.  GI: Soft. Bowel sounds are normal. She exhibits no distension. There is no tenderness. There is no rebound.  Musculoskeletal: She exhibits no edema or deformity.  Psychiatric: She has a normal mood and affect. Her behavior is normal.  Skin. Warm and dry Neurological: She is alert.  Makes good eye contact with examiner. She can provide  her name but was uncertain of her date of birth. Limited medical historian. She was able to follow simple commands. RUE 3 to 3+/5  RLE 4/5 prox to distal. LUE and LLE 4+ to 5/5. ?decrease of LT RUE and RLE. Speech slightly dysarthric. Language normal    Results for orders placed or performed during the hospital encounter of 10/08/16 (from the past 48 hour(s))  Glucose, capillary     Status: Abnormal   Collection Time: 10/09/16  4:28 PM  Result Value Ref Range   Glucose-Capillary 112 (H) 65 - 99 mg/dL  Glucose, capillary     Status: Abnormal   Collection Time: 10/09/16  8:56 PM  Result Value Ref Range   Glucose-Capillary 116 (H) 65 - 99 mg/dL   Comment 1 Notify RN    Comment 2 Document in Chart   Rapid urine drug screen (hospital performed)     Status: Abnormal   Collection Time: 10/10/16  6:46 AM  Result Value Ref Range   Opiates POSITIVE (A) NONE DETECTED   Cocaine NONE DETECTED NONE DETECTED   Benzodiazepines POSITIVE (A) NONE DETECTED   Amphetamines NONE DETECTED NONE DETECTED   Tetrahydrocannabinol NONE DETECTED NONE DETECTED   Barbiturates NONE DETECTED NONE DETECTED    Comment:  DRUG SCREEN FOR MEDICAL PURPOSES ONLY.  IF CONFIRMATION IS NEEDED FOR ANY PURPOSE, NOTIFY LAB WITHIN 5 DAYS.        LOWEST DETECTABLE LIMITS FOR URINE DRUG SCREEN Drug Class       Cutoff (ng/mL) Amphetamine      1000 Barbiturate      200 Benzodiazepine   458 Tricyclics       099 Opiates          300 Cocaine          300 THC              50   CBC     Status: None   Collection Time: 10/10/16  6:51 AM  Result Value Ref Range   WBC 9.1 4.0 - 10.5 K/uL   RBC 4.33 3.87 - 5.11 MIL/uL   Hemoglobin 13.0 12.0 - 15.0 g/dL   HCT 39.9 36.0 - 46.0 %   MCV 92.1 78.0 - 100.0 fL   MCH 30.0 26.0 - 34.0 pg   MCHC 32.6 30.0 - 36.0 g/dL   RDW 14.6 11.5 - 15.5 %   Platelets 262 150 - 400 K/uL  Glucose, capillary     Status: Abnormal   Collection Time: 10/10/16  6:54 AM  Result Value Ref Range    Glucose-Capillary 100 (H) 65 - 99 mg/dL   Comment 1 Notify RN    Comment 2 Document in Chart   I-STAT, chem 8     Status: Abnormal   Collection Time: 10/10/16  9:45 AM  Result Value Ref Range   Sodium 140 135 - 145 mmol/L   Potassium 2.9 (L) 3.5 - 5.1 mmol/L   Chloride 99 (L) 101 - 111 mmol/L   BUN 17 6 - 20 mg/dL   Creatinine, Ser 0.70 0.44 - 1.00 mg/dL   Glucose, Bld 102 (H) 65 - 99 mg/dL   Calcium, Ion 1.25 1.15 - 1.40 mmol/L   TCO2 30 0 - 100 mmol/L   Hemoglobin 11.9 (L) 12.0 - 15.0 g/dL   HCT 35.0 (L) 36.0 - 46.0 %  Glucose, capillary     Status: Abnormal   Collection Time: 10/10/16 12:00 PM  Result Value Ref Range   Glucose-Capillary 109 (H) 65 - 99 mg/dL  Basic metabolic panel     Status: Abnormal   Collection Time: 10/10/16  1:49 PM  Result Value Ref Range   Sodium 137 135 - 145 mmol/L   Potassium 3.0 (L) 3.5 - 5.1 mmol/L   Chloride 101 101 - 111 mmol/L   CO2 30 22 - 32 mmol/L   Glucose, Bld 179 (H) 65 - 99 mg/dL   BUN 13 6 - 20 mg/dL   Creatinine, Ser 0.72 0.44 - 1.00 mg/dL   Calcium 8.6 (L) 8.9 - 10.3 mg/dL   GFR calc non Af Amer >60 >60 mL/min   GFR calc Af Amer >60 >60 mL/min    Comment: (NOTE) The eGFR has been calculated using the CKD EPI equation. This calculation has not been validated in all clinical situations. eGFR's persistently <60 mL/min signify possible Chronic Kidney Disease.    Anion gap 6 5 - 15  Protime-INR     Status: Abnormal   Collection Time: 10/10/16  1:49 PM  Result Value Ref Range   Prothrombin Time 15.3 (H) 11.4 - 15.2 seconds   INR 1.20   APTT     Status: None   Collection Time: 10/10/16  1:49 PM  Result Value Ref Range  aPTT 32 24 - 36 seconds  Glucose, capillary     Status: Abnormal   Collection Time: 10/10/16  4:52 PM  Result Value Ref Range   Glucose-Capillary 126 (H) 65 - 99 mg/dL   Comment 1 Notify RN    Comment 2 Document in Chart   Glucose, capillary     Status: Abnormal   Collection Time: 10/10/16  9:02 PM  Result  Value Ref Range   Glucose-Capillary 135 (H) 65 - 99 mg/dL   Comment 1 Notify RN    Comment 2 Document in Chart   Glucose, capillary     Status: Abnormal   Collection Time: 10/11/16  6:07 AM  Result Value Ref Range   Glucose-Capillary 140 (H) 65 - 99 mg/dL   Comment 1 Notify RN    Comment 2 Document in Chart   Glucose, capillary     Status: None   Collection Time: 10/11/16 11:10 AM  Result Value Ref Range   Glucose-Capillary 95 65 - 99 mg/dL   Comment 1 Notify RN    Comment 2 Document in Chart    Ct Angio Head W Or Wo Contrast  Addendum Date: 10/09/2016   ADDENDUM REPORT: 10/09/2016 23:04 ADDENDUM: Critical Value/emergent results were called by telephone at the time of interpretation on 10/09/2016 at 11:04 pm to Dr. Cheral Marker, Neurology, who verbally acknowledged these results. Electronically Signed   By: Elon Alas M.D.   On: 10/09/2016 23:04   Result Date: 10/09/2016 CLINICAL DATA:  Follow-up stroke. History of hypertension, hyperlipidemia and breast cancer. EXAM: CT ANGIOGRAPHY HEAD AND NECK TECHNIQUE: Multidetector CT imaging of the head and neck was performed using the standard protocol during bolus administration of intravenous contrast. Multiplanar CT image reconstructions and MIPs were obtained to evaluate the vascular anatomy. Carotid stenosis measurements (when applicable) are obtained utilizing NASCET criteria, using the distal internal carotid diameter as the denominator. CONTRAST:  50 cc Isovue 370 COMPARISON:  MRI of the head October 08, 2016 FINDINGS: CT HEAD FINDINGS BRAIN: No intraparenchymal hemorrhage, mass effect or midline shift. Focal mesial RIGHT occipital lobe encephalomalacia. Patchy RIGHT parietal hypodensities. Patchy white matter hypodensities corresponding to acute infarcts. Mild prominence of the ventricles and sulci for patient's age. No abnormal extra-axial fluid collections. VASCULAR: Mild calcific atherosclerosis carotid siphon found vertebral artery's.  SKULL/SOFT TISSUES: No skull fracture. No significant soft tissue swelling. ORBITS/SINUSES: The included ocular globes and orbital contents are normal.Minimal RIGHT sphenoid mucosal thickening. No paranasal sinus air-fluid levels. Mastoid air cells are well aerated. OTHER: Patient is edentulous. CTA NECK AORTIC ARCH: Mild intimal thickening calcific atherosclerosis. 2.9 cm segment LEFT subclavian artery origin occlusion with expanded appearance, reconstitution at medially proximal to LEFT vertebral artery takeoff which is patent. Innominate and LEFT Common carotid artery origins are widely patent. Mild stenosis RIGHT proximal subclavian artery. RIGHT CAROTID SYSTEM: Common carotid artery is widely patent. Normal appearance of the carotid bifurcation without hemodynamically significant stenosis by NASCET criteria, mild intimal thickening. Normal appearance of the included internal carotid artery. LEFT CAROTID SYSTEM: Common carotid artery is widely patent. Normal appearance of the carotid bifurcation without hemodynamically significant stenosis by NASCET criteria, mild calcific atherosclerosis. Normal appearance of the included internal carotid artery. VERTEBRAL ARTERIES:Codominant vertebral artery's. Moderate stenosis RIGHT vertebral artery origin. Bilateral vertebral artery's are patent throughout the course. SKELETON: No acute osseous process though bone windows have not been submitted. OTHER NECK: Soft tissues of the neck are non-acute though, not tailored for evaluation. Centrilobular emphysema included lung apices. CTA HEAD  ANTERIOR CIRCULATION: Patent cervical internal carotid arteries, petrous, cavernous and supra clinoid internal carotid arteries. 3 mm posteriorly directed LEFT posterior communicating origin at ICA. Calcific atherosclerosis resulting in 2 mm segment severe stenosis LEFT para clinoid internal carotid artery, moderate stenosis RIGHT paraclinoid internal carotid artery. Thready bilateral A1  segments, patent anterior communicating artery. Severe stenosis LEFT A2 segment. Moderate luminal irregularity bilateral middle cerebral artery's including moderate stenosis proximal RIGHT M1 segment. Severe focal stenosis LEFT M3 segment. No large vessel occlusion, contrast extravasation or aneurysm. POSTERIOR CIRCULATION: Patent vertebral arteries, vertebrobasilar junction and basilar artery, as well as main branch vessels. Mildly stenotic LEFT vertebral artery due to the calcific atherosclerosis. Patent posterior cerebral arteries, moderate luminal irregularity. No large vessel occlusion, contrast extravasation. VENOUS SINUSES: Major dural venous sinuses are patent though not tailored for evaluation on this angiographic examination. ANATOMIC VARIANTS: None. DELAYED PHASE: No abnormal intracranial enhancement MIP images reviewed. IMPRESSION: CT HEAD: 1. Evolving small acute watershed and RIGHT MCA territory infarcts without hemorrhagic conversion. 2. Old small RIGHT frontal/ACA territory infarct. 3. Mild parenchymal brain volume loss for age. CTA NECK: 1. 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 Reconstitution proximal to LEFT vertebral artery concerning for steal. 2. Atherosclerosis without hemodynamically significant stenosis carotid artery's. 3. Moderate stenosis RIGHT vertebral artery origin. CTA HEAD: 1. No emergent large vessel occlusion. 2. Severe stenoses LEFT paraclinoid ICA, LEFT anterior cerebral artery and LEFT middle cerebral artery. 3. Moderate general cerebral artery atherosclerosis and stenoses. Electronically Signed: By: Elon Alas M.D. On: 10/09/2016 22:47   Ct Angio Neck W Or Wo Contrast  Addendum Date: 10/09/2016   ADDENDUM REPORT: 10/09/2016 23:04 ADDENDUM: Critical Value/emergent results were called by telephone at the time of interpretation on 10/09/2016 at 11:04 pm to Dr. Cheral Marker, Neurology, who verbally acknowledged these results. Electronically Signed   By:  Elon Alas M.D.   On: 10/09/2016 23:04   Result Date: 10/09/2016 CLINICAL DATA:  Follow-up stroke. History of hypertension, hyperlipidemia and breast cancer. EXAM: CT ANGIOGRAPHY HEAD AND NECK TECHNIQUE: Multidetector CT imaging of the head and neck was performed using the standard protocol during bolus administration of intravenous contrast. Multiplanar CT image reconstructions and MIPs were obtained to evaluate the vascular anatomy. Carotid stenosis measurements (when applicable) are obtained utilizing NASCET criteria, using the distal internal carotid diameter as the denominator. CONTRAST:  50 cc Isovue 370 COMPARISON:  MRI of the head October 08, 2016 FINDINGS: CT HEAD FINDINGS BRAIN: No intraparenchymal hemorrhage, mass effect or midline shift. Focal mesial RIGHT occipital lobe encephalomalacia. Patchy RIGHT parietal hypodensities. Patchy white matter hypodensities corresponding to acute infarcts. Mild prominence of the ventricles and sulci for patient's age. No abnormal extra-axial fluid collections. VASCULAR: Mild calcific atherosclerosis carotid siphon found vertebral artery's. SKULL/SOFT TISSUES: No skull fracture. No significant soft tissue swelling. ORBITS/SINUSES: The included ocular globes and orbital contents are normal.Minimal RIGHT sphenoid mucosal thickening. No paranasal sinus air-fluid levels. Mastoid air cells are well aerated. OTHER: Patient is edentulous. CTA NECK AORTIC ARCH: Mild intimal thickening calcific atherosclerosis. 2.9 cm segment LEFT subclavian artery origin occlusion with expanded appearance, reconstitution at medially proximal to LEFT vertebral artery takeoff which is patent. Innominate and LEFT Common carotid artery origins are widely patent. Mild stenosis RIGHT proximal subclavian artery. RIGHT CAROTID SYSTEM: Common carotid artery is widely patent. Normal appearance of the carotid bifurcation without hemodynamically significant stenosis by NASCET criteria, mild intimal  thickening. Normal appearance of the included internal carotid artery. LEFT CAROTID SYSTEM: Common carotid artery is widely patent.  Normal appearance of the carotid bifurcation without hemodynamically significant stenosis by NASCET criteria, mild calcific atherosclerosis. Normal appearance of the included internal carotid artery. VERTEBRAL ARTERIES:Codominant vertebral artery's. Moderate stenosis RIGHT vertebral artery origin. Bilateral vertebral artery's are patent throughout the course. SKELETON: No acute osseous process though bone windows have not been submitted. OTHER NECK: Soft tissues of the neck are non-acute though, not tailored for evaluation. Centrilobular emphysema included lung apices. CTA HEAD ANTERIOR CIRCULATION: Patent cervical internal carotid arteries, petrous, cavernous and supra clinoid internal carotid arteries. 3 mm posteriorly directed LEFT posterior communicating origin at ICA. Calcific atherosclerosis resulting in 2 mm segment severe stenosis LEFT para clinoid internal carotid artery, moderate stenosis RIGHT paraclinoid internal carotid artery. Thready bilateral A1 segments, patent anterior communicating artery. Severe stenosis LEFT A2 segment. Moderate luminal irregularity bilateral middle cerebral artery's including moderate stenosis proximal RIGHT M1 segment. Severe focal stenosis LEFT M3 segment. No large vessel occlusion, contrast extravasation or aneurysm. POSTERIOR CIRCULATION: Patent vertebral arteries, vertebrobasilar junction and basilar artery, as well as main branch vessels. Mildly stenotic LEFT vertebral artery due to the calcific atherosclerosis. Patent posterior cerebral arteries, moderate luminal irregularity. No large vessel occlusion, contrast extravasation. VENOUS SINUSES: Major dural venous sinuses are patent though not tailored for evaluation on this angiographic examination. ANATOMIC VARIANTS: None. DELAYED PHASE: No abnormal intracranial enhancement MIP images  reviewed. IMPRESSION: CT HEAD: 1. Evolving small acute watershed and RIGHT MCA territory infarcts without hemorrhagic conversion. 2. Old small RIGHT frontal/ACA territory infarct. 3. Mild parenchymal brain volume loss for age. CTA NECK: 1. 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 Reconstitution proximal to LEFT vertebral artery concerning for steal. 2. Atherosclerosis without hemodynamically significant stenosis carotid artery's. 3. Moderate stenosis RIGHT vertebral artery origin. CTA HEAD: 1. No emergent large vessel occlusion. 2. Severe stenoses LEFT paraclinoid ICA, LEFT anterior cerebral artery and LEFT middle cerebral artery. 3. Moderate general cerebral artery atherosclerosis and stenoses. Electronically Signed: By: Elon Alas M.D. On: 10/09/2016 22:47   Ir Angiogram Extremity Left  Result Date: 10/11/2016 CLINICAL DATA:  Left-sided weakness with confusion. Right hemispheric watershed ischemic strokes. Abnormal MRI of the brain. EXAM: BILATERAL COMMON CAROTID AND INNOMINATE ANGIOGRAPHY; LEFT EXTREMITY ARTERIOGRAPHY; IR ANGIO VERTEBRAL SEL SUBCLAVIAN INNOMINATE UNI RIGHT MOD SED COMPARISON:  MRI of the brain of 10/08/2016, and CT angiogram of the brain of 10/09/2016. MEDICATIONS: Heparin 1500 units IV; no antibiotic was administered within 1 hour of the procedure. ANESTHESIA/SEDATION: Versed 0.5 mg IV; Fentanyl 50 mcg IV. Moderate Sedation Time:  28 minutes. The patient was continuously monitored during the procedure by the interventional radiology nurse under my direct supervision. CONTRAST:  Isovue 300 approximately 55 mL. FLUOROSCOPY TIME:  Fluoroscopy Time: 9 minutes 54 seconds (971 mGy). COMPLICATIONS: None immediate. TECHNIQUE: Informed written consent was obtained from the patient after a thorough discussion of the procedural risks, benefits and alternatives. All questions were addressed. Maximal Sterile Barrier Technique was utilized including caps, mask, sterile gowns, sterile  gloves, sterile drape, hand hygiene and skin antiseptic. A timeout was performed prior to the initiation of the procedure. The right groin was prepped and draped in the usual sterile fashion. Thereafter using modified Seldinger technique, transfemoral access into the right common femoral artery was obtained without difficulty. Over a 0.035 inch guidewire, a 5 French Pinnacle sheath was inserted. Through this, and also over 0.035 inch guidewire, a 5 [place] JB 1 catheter was advanced to the aortic arch region and selectively positioned in the right common carotid artery, , the right subclavian  artery, the left common carotid artery and the left subclavian artery. FINDINGS: The right common carotid arteriogram demonstrates the right external carotid artery and its major branches to widely patent. The right internal carotid artery has a smooth shallow plaque along its medial wall without associated ulcerations or stenosis by the NASCET criteria. Distal to this the vessel is seen to opacify normally to the cranial skull base. The petrous, cavernous and supraclinoid segments are widely patent. Transient opacification via the right posterior communicating artery of the right posterior cerebral artery distribution is seen. The right middle cerebral artery and the proximal M1 segment demonstrates 70% focal stenosis due to a smooth circumferential plaque. Distal to this there is mild post stenotic dilatation. The right middle cerebral artery trifurcation branches are seen to opacify normally into the capillary and venous phases. There is approximately 50-70% stenoses of the anterior cerebral artery proximal A1 segment. Distal to this flow is noted into the right anterior cerebral artery into the pericallosal and the callosal marginal branches. In flow of non-opacified blood into the right anterior cerebral artery A2 segment is seen via the anterior communicating artery from the left internal carotid artery. The right  vertebral artery origin demonstrates a 50% stenosis at its origin. Distal to this the vessel is seen to opacify normally to the cranial skull base. Normal opacification is seen of the right vertebrobasilar junction and the right posterior-inferior cerebellar artery. The basilar artery demonstrates approximately 30% stenosis in its mid basilar artery distal to the anterior-inferior cerebellar arteries, and of a 50% proximal to the termination into the posterior cerebral arteries. The posterior cerebral arteries, superior cerebellar arteries and the anterior-inferior cerebellar arteries are seen to opacify into the capillary and venous phases. The left common carotid arteriogram demonstrates the left external carotid artery and its major branches to be widely patent. There is a smooth shallow plaque along the anterior wall of the left common carotid artery just proximal to its bifurcation. The left internal carotid artery at the bulb is widely patent. There is mild smooth caliber irregularity of the left internal carotid artery just distal to the bulb which probably represents mild FMD-like changes. Distal to this the left internal carotid artery is seen to opacify to the cranial skull base. The petrous and the proximal cavernous segments are widely patent. There is approximately 65% stenosis of the left internal carotid artery, proximal supraclinoid segment. The ophthalmic artery is widely patent. The left middle cerebral artery demonstrates mild narrowing in its M1 segment. The left MCA trifurcation branches are widely patent into the capillary and venous phases. The left anterior cerebral artery A1 segment demonstrates approximately 50-70% stenoses. Distal to this the left anterior cerebral A2 segment demonstrates normal opacification into the capillary and venous phases. There is prompt opacification via the anterior communicating artery of the right anterior cerebral artery A2 segment and distally. Also  demonstrated is a conical shaped outpouching in the region of the left posterior communicating artery most compatible with an infundibulum. The left subclavian arteriogram demonstrates a severe high-grade focal stenosis distal to its origin associated with filling defects and slow opacification of the left subclavian artery distally. 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 Approximately 50% stenosis of the right vertebral artery at its origin. Approximately 65% stenosis of the left internal carotid artery  supraclinoid segment. Approximately 70% stenosis of the right middle cerebral artery M1 segment proximally. Approximately 50-70% stenoses of the anterior cerebral arteries A1 segments bilaterally. PLAN: Angiographic findings were reviewed with the patient. Electronically Signed   By: Luanne Bras M.D.   On: 10/10/2016 18:24   Ir Angio Intra Extracran Sel Com Carotid Innominate Bilat Mod Sed  Result Date: 10/11/2016 CLINICAL DATA:  Left-sided weakness with confusion. Right hemispheric watershed ischemic strokes. Abnormal MRI of the brain. EXAM: BILATERAL COMMON CAROTID AND INNOMINATE ANGIOGRAPHY; LEFT EXTREMITY ARTERIOGRAPHY; IR ANGIO VERTEBRAL SEL SUBCLAVIAN INNOMINATE UNI RIGHT MOD SED COMPARISON:  MRI of the brain of 10/08/2016, and CT angiogram of the brain of 10/09/2016. MEDICATIONS: Heparin 1500 units IV; no antibiotic was administered within 1 hour of the procedure. ANESTHESIA/SEDATION: Versed 0.5 mg IV; Fentanyl 50 mcg IV. Moderate Sedation Time:  28 minutes. The patient was continuously monitored during the procedure by the interventional radiology nurse under my direct supervision. CONTRAST:  Isovue 300 approximately 55 mL. FLUOROSCOPY TIME:  Fluoroscopy Time: 9 minutes 54  seconds (971 mGy). COMPLICATIONS: None immediate. TECHNIQUE: Informed written consent was obtained from the patient after a thorough discussion of the procedural risks, benefits and alternatives. All questions were addressed. Maximal Sterile Barrier Technique was utilized including caps, mask, sterile gowns, sterile gloves, sterile drape, hand hygiene and skin antiseptic. A timeout was performed prior to the initiation of the procedure. The right groin was prepped and draped in the usual sterile fashion. Thereafter using modified Seldinger technique, transfemoral access into the right common femoral artery was obtained without difficulty. Over a 0.035 inch guidewire, a 5 French Pinnacle sheath was inserted. Through this, and also over 0.035 inch guidewire, a 5 [place] JB 1 catheter was advanced to the aortic arch region and selectively positioned in the right common carotid artery, , the right subclavian artery, the left common carotid artery and the left subclavian artery. FINDINGS: The right common carotid arteriogram demonstrates the right external carotid artery and its major branches to widely patent. The right internal carotid artery has a smooth shallow plaque along its medial wall without associated ulcerations or stenosis by the NASCET criteria. Distal to this the vessel is seen to opacify normally to the cranial skull base. The petrous, cavernous and supraclinoid segments are widely patent. Transient opacification via the right posterior communicating artery of the right posterior cerebral artery distribution is seen. The right middle cerebral artery and the proximal M1 segment demonstrates 70% focal stenosis due to a smooth circumferential plaque. Distal to this there is mild post stenotic dilatation. The right middle cerebral artery trifurcation branches are seen to opacify normally into the capillary and venous phases. There is approximately 50-70% stenoses of the anterior cerebral artery proximal A1  segment. Distal to this flow is noted into the right anterior cerebral artery into the pericallosal and the callosal marginal branches. In flow of non-opacified blood into the right anterior cerebral artery A2 segment is seen via the anterior communicating artery from the left internal carotid artery. The right vertebral artery origin demonstrates a 50% stenosis at its origin. Distal to this the vessel is seen to opacify normally to the cranial skull base. Normal opacification is seen of the right vertebrobasilar junction and the right posterior-inferior cerebellar artery. The basilar artery demonstrates approximately 30% stenosis in its mid basilar artery distal to the anterior-inferior cerebellar arteries, and of a 50% proximal to the termination into the posterior cerebral arteries. The posterior cerebral arteries, superior cerebellar arteries and the anterior-inferior  cerebellar arteries are seen to opacify into the capillary and venous phases. The left common carotid arteriogram demonstrates the left external carotid artery and its major branches to be widely patent. There is a smooth shallow plaque along the anterior wall of the left common carotid artery just proximal to its bifurcation. The left internal carotid artery at the bulb is widely patent. There is mild smooth caliber irregularity of the left internal carotid artery just distal to the bulb which probably represents mild FMD-like changes. Distal to this the left internal carotid artery is seen to opacify to the cranial skull base. The petrous and the proximal cavernous segments are widely patent. There is approximately 65% stenosis of the left internal carotid artery, proximal supraclinoid segment. The ophthalmic artery is widely patent. The left middle cerebral artery demonstrates mild narrowing in its M1 segment. The left MCA trifurcation branches are widely patent into the capillary and venous phases. The left anterior cerebral artery A1 segment  demonstrates approximately 50-70% stenoses. Distal to this the left anterior cerebral A2 segment demonstrates normal opacification into the capillary and venous phases. There is prompt opacification via the anterior communicating artery of the right anterior cerebral artery A2 segment and distally. Also demonstrated is a conical shaped outpouching in the region of the left posterior communicating artery most compatible with an infundibulum. The left subclavian arteriogram demonstrates a severe high-grade focal stenosis distal to its origin associated with filling defects and slow opacification of the left subclavian artery distally. 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 Approximately 50% stenosis of the right vertebral artery at its origin. Approximately 65% stenosis of the left internal carotid artery supraclinoid segment. Approximately 70% stenosis of the right middle cerebral artery M1 segment proximally. Approximately 50-70% stenoses of the anterior cerebral arteries A1 segments bilaterally. PLAN: Angiographic findings were reviewed with the patient. Electronically Signed   By: Luanne Bras M.D.   On: 10/10/2016 18:24   Ir Angio Vertebral Sel Subclavian Innominate Uni R Mod Sed  Result Date: 10/11/2016 CLINICAL DATA:  Left-sided weakness with confusion. Right hemispheric watershed ischemic strokes. Abnormal MRI of the brain. EXAM: BILATERAL COMMON CAROTID AND INNOMINATE ANGIOGRAPHY; LEFT EXTREMITY ARTERIOGRAPHY; IR ANGIO VERTEBRAL SEL SUBCLAVIAN INNOMINATE UNI RIGHT MOD SED COMPARISON:  MRI of the brain of 10/08/2016, and CT angiogram of the brain of 10/09/2016. MEDICATIONS: Heparin 1500 units IV; no antibiotic was administered within 1 hour of the procedure. ANESTHESIA/SEDATION:  Versed 0.5 mg IV; Fentanyl 50 mcg IV. Moderate Sedation Time:  28 minutes. The patient was continuously monitored during the procedure by the interventional radiology nurse under my direct supervision. CONTRAST:  Isovue 300 approximately 55 mL. FLUOROSCOPY TIME:  Fluoroscopy Time: 9 minutes 54 seconds (971 mGy). COMPLICATIONS: None immediate. TECHNIQUE: Informed written consent was obtained from the patient after a thorough discussion of the procedural risks, benefits and alternatives. All questions were addressed. Maximal Sterile Barrier Technique was utilized including caps, mask, sterile gowns, sterile gloves, sterile drape, hand hygiene and skin antiseptic. A timeout was performed prior to the initiation of the procedure. The right groin was prepped and draped in the usual sterile fashion. Thereafter using modified Seldinger technique, transfemoral access into the right common femoral artery was obtained without difficulty. Over a 0.035 inch guidewire, a 5 French Pinnacle sheath was inserted. Through this, and  also over 0.035 inch guidewire, a 5 [place] JB 1 catheter was advanced to the aortic arch region and selectively positioned in the right common carotid artery, , the right subclavian artery, the left common carotid artery and the left subclavian artery. FINDINGS: The right common carotid arteriogram demonstrates the right external carotid artery and its major branches to widely patent. The right internal carotid artery has a smooth shallow plaque along its medial wall without associated ulcerations or stenosis by the NASCET criteria. Distal to this the vessel is seen to opacify normally to the cranial skull base. The petrous, cavernous and supraclinoid segments are widely patent. Transient opacification via the right posterior communicating artery of the right posterior cerebral artery distribution is seen. The right middle cerebral artery and the proximal M1 segment demonstrates 70% focal stenosis due to a  smooth circumferential plaque. Distal to this there is mild post stenotic dilatation. The right middle cerebral artery trifurcation branches are seen to opacify normally into the capillary and venous phases. There is approximately 50-70% stenoses of the anterior cerebral artery proximal A1 segment. Distal to this flow is noted into the right anterior cerebral artery into the pericallosal and the callosal marginal branches. In flow of non-opacified blood into the right anterior cerebral artery A2 segment is seen via the anterior communicating artery from the left internal carotid artery. The right vertebral artery origin demonstrates a 50% stenosis at its origin. Distal to this the vessel is seen to opacify normally to the cranial skull base. Normal opacification is seen of the right vertebrobasilar junction and the right posterior-inferior cerebellar artery. The basilar artery demonstrates approximately 30% stenosis in its mid basilar artery distal to the anterior-inferior cerebellar arteries, and of a 50% proximal to the termination into the posterior cerebral arteries. The posterior cerebral arteries, superior cerebellar arteries and the anterior-inferior cerebellar arteries are seen to opacify into the capillary and venous phases. The left common carotid arteriogram demonstrates the left external carotid artery and its major branches to be widely patent. There is a smooth shallow plaque along the anterior wall of the left common carotid artery just proximal to its bifurcation. The left internal carotid artery at the bulb is widely patent. There is mild smooth caliber irregularity of the left internal carotid artery just distal to the bulb which probably represents mild FMD-like changes. Distal to this the left internal carotid artery is seen to opacify to the cranial skull base. The petrous and the proximal cavernous segments are widely patent. There is approximately 65% stenosis of the left internal carotid  artery, proximal supraclinoid segment. The ophthalmic artery is widely patent. The left middle cerebral artery demonstrates mild narrowing in its M1 segment. The left MCA trifurcation branches are widely patent into the capillary and venous phases. The left anterior cerebral artery A1 segment demonstrates approximately 50-70% stenoses. Distal to this the left anterior cerebral A2 segment demonstrates normal opacification into the capillary and venous phases. There is prompt opacification via the anterior communicating artery of the right anterior cerebral artery A2 segment and distally. Also demonstrated is a conical shaped outpouching in the region of the left posterior communicating artery most compatible with an infundibulum. The left subclavian arteriogram demonstrates a severe high-grade focal stenosis distal to its origin associated with filling defects and slow opacification of the left subclavian artery distally. 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 Approximately 50% stenosis of the right vertebral artery at its origin. Approximately 65% stenosis of the left internal carotid artery supraclinoid segment. Approximately 70% stenosis of the right middle cerebral artery M1 segment proximally. Approximately 50-70% stenoses of the anterior cerebral arteries A1 segments bilaterally. PLAN: Angiographic findings were reviewed with the patient. Electronically Signed   By: Luanne Bras M.D.   On: 10/10/2016 18:24       Medical Problem List and Plan: 1.  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  DVT Prophylaxis/Anticoagulation: Lovenox 40 mg daily. Monitor for any bleeding episodes 3. Pain Management/chronic back pain: OxyContin  sustained release 10 mg every 12 hours, oxycodone immediate release as needed. Monitor mental status with medication 4. Mood: Xanax 1 mg twice a day as needed 5. Neuropsych: This patient is capable of making decisions on her own behalf. 6. Skin/Wound Care: Routine skin checks 7. Fluids/Electrolytes/Nutrition: Routine I&O with follow-up chemistries 8. 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 Follow-up outpatient interventional radiology for possible elective angioplasty versus stenting 9. Hypertension. No current antihypertensive medication. Monitor with increased mobility. Patient on Toprol 50 mg daily, Hyzaar 100-12 0.5 mg daily, Norvasc 5 mg daily prior to admission. Resume as needed 10. Diabetes mellitus with peripheral neuropathy. Hemoglobin A1c 6.5.SSI. Check blood sugars before meals and at bedtime. Diabetic teaching. Patient on Glucophage 500 mg twice a day prior to admission 11. Hyperlipidemia. Lipitor 12. Tobacco abuse. Counseling 13. History of breast cancer. Currently in remission.   Post Admission Physician Evaluation: 1. Functional deficits secondary  to bilateral fronto-parietal infarcts. 2. Patient is admitted to receive collaborative, interdisciplinary care between the physiatrist, rehab nursing staff, and therapy team. 3. Patient's level of medical complexity and substantial therapy needs in context of that medical necessity cannot be provided at a lesser intensity of care such as a SNF. 4. Patient has experienced substantial functional loss from his/her baseline which was documented above under the "Functional History" and "Functional Status" headings.  Judging by the patient's diagnosis, physical exam, and functional history, the patient has potential for functional progress which will result in measurable gains while on inpatient rehab.  These gains will be of substantial and practical use upon discharge  in facilitating mobility and self-care at  the household level. 5. Physiatrist will provide 24 hour management of medical needs as well as oversight of the therapy plan/treatment and provide guidance as appropriate regarding the interaction of the two. 6. The Preadmission Screening has been reviewed and patient status is unchanged unless otherwise stated above. 7. 24 hour rehab nursing will assist with bladder management, bowel management, safety, skin/wound care, disease management, medication administration, pain management and patient education  and help integrate therapy concepts, techniques,education, etc. 8. PT will assess and treat for/with: Lower extremity strength, range of motion, stamina, balance, functional mobility, safety, adaptive techniques and equipment, NMR, family education.   Goals are: mod I. 9. OT will assess and treat for/with: ADL's, functional mobility, safety, upper extremity strength, adaptive techniques and equipment, NMR, community reentry.   Goals are: mod I. Therapy mayt proceed with showering this patient. 10. SLP will assess and treat for/with: cognition, communication.  Goals are: mod I. 11. Case Management and Social Worker will assess and treat for psychological issues and discharge planning. 12. Team conference will be held weekly to assess progress toward goals and to determine barriers to discharge. 13. Patient will  receive at least 3 hours of therapy per day at least 5 days per week. 14. ELOS: 8-12 days       15. Prognosis:  excellent     [name], MD, [facility] 10/11/2016  Cathlyn Parsons., PA-C 10/11/2016

## 2016-10-11 NOTE — Progress Notes (Signed)
Rehab admissions - I met with patient and explained inpatient rehab.  Patient does not read or write above 3rd grade level.  I did give her rehab booklets.  She is agreeable to inpatient rehab stay.  Bed available and will admit to acute inpatient rehab today.  Call me for questions.  #[phone]

## 2016-10-11 NOTE — H&P (Signed)
Physical Medicine and Rehabilitation Admission H&P       Chief Complaint  Patient presents with  . Altered Mental Status  : HPI: Natalie Pollino RodriguesPintois a 56 y.o.right handed femalewith history of hypertension, diabetes mellitus, breast cancer in remission, chronic back pain, tobacco abuse. Presented 10/09/2016 with right side weakness, slurred speechas well as recent fall and had been essentially bedbound for the past week or so. Per chart review patient lives with boyfriend. One level home for steps to entry. Independent prior to admission. Cranial CT scan showed no evidence of acute infarction. Blood pressure 88/40. Urine drug screen positive for opiates and benzos. MRI of the brain showed numerous acute small frontoparietal infarcts including the small vessel and MCA territories. MRI cervical spine without neurocompression. CT angiogram of head and neck showed no emergent large vessel occlusion. 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 Lower extremity Dopplers negative. Patient did not receive TPA. Cerebral angiogram 10/10/2016 per interventional radiology showed approximately 70% stenosis right MCA proximal M1 segment. Approximately 50% stenosis of right VA origin. Echocardiogram with ejection fraction 65% no wall motion abnormalities. Presently on aspirin and Plavix for CVA prophylaxis. Subcutaneous Lovenox for DVT prophylaxis. Physical and occupational therapy evaluations completed with recommendations of physical medicine rehabilitation consult.Patient was admitted for a comprehensive rehabilitation program  Review of Systems  Constitutional: Negative for chills and fever.  HENT: Negative for hearing loss.   Eyes: Negative for blurred vision and double vision.  Respiratory: Positive for shortness of breath.   Cardiovascular: Positive for leg swelling. Negative for chest pain and palpitations.  Gastrointestinal: Positive  for constipation. Negative for nausea.  Genitourinary: Negative for dysuria and hematuria.  Musculoskeletal: Positive for back pain.  Skin: Negative for rash.  Neurological: Positive for speech change and focal weakness. Negative for seizures.  Psychiatric/Behavioral:       Anxiety  All other systems reviewed and are negative.      Past Medical History:  Diagnosis Date  . Asthma   . Back muscle spasm 12/13/2010  . Cancer [facility])    breast cancer, remission 2010  . Chronic back pain   . Chronic foot pain   . COPD (chronic obstructive pulmonary disease) ([place]) 09/14/2010  . H/O MRSA infection 09/14/2010  . High blood cholesterol   . Hyperlipidemia 12/07/2012  . Hypertension   . 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 Psoriasis 12/13/2010  . Tobacco abuse 12/07/2012        Past Surgical History:  Procedure Laterality Date  . ABDOMINAL HYSTERECTOMY    . CESAREAN SECTION     x7  . IR ANGIO INTRA EXTRACRAN SEL COM CAROTID INNOMINATE BILAT MOD SED  10/10/2016  . IR ANGIO VERTEBRAL SEL SUBCLAVIAN INNOMINATE UNI R MOD SED  10/10/2016  . IR ANGIOGRAM EXTREMITY LEFT  10/10/2016  . MASTECTOMY     left  . MASTECTOMY    . PORT-A-CATH REMOVAL  06/01/2011   Procedure: REMOVAL PORT-A-CATH;  Surgeon: Donato Heinz, MD;  Location: [facility];  Service: General;  Laterality: N/A;  Minor Room  . portacath insertion          Family History  Problem Relation Age of Onset  . Stroke Mother   . Stroke Father   . Cancer Maternal Aunt   . Cancer Maternal Grandmother   . Cancer Maternal Aunt   . Cancer Maternal Aunt    Social History:  reports that she has been smoking.  She has a 9.00 pack-year smoking history. She has never used smokeless tobacco. She reports that she uses drugs, including Marijuana. She reports that she does not drink alcohol. Allergies:       Allergies  Allergen Reactions  . Codeine Anaphylaxis  . Penicillins Anaphylaxis and Other (See  Comments)    Has patient had a PCN reaction causing immediate rash, facial/tongue/throat swelling, SOB or lightheadedness with hypotension: Yes Has patient had a PCN reaction causing severe rash involving mucus membranes or skin necrosis: No Has patient had a PCN reaction that required hospitalization: Yes Has patient had a PCN reaction occurring within the last 10 years: No If all of the above answers are "NO", then may proceed with Cephalosporin use.          Medications Prior to Admission  Medication Sig Dispense Refill  . ALPRAZolam (XANAX) 1 MG tablet Take 1 tablet (1 mg total) by mouth 2 (two) times daily as needed. for anxiety (Patient taking differently: Take 1 mg by mouth 2 (two) times daily as needed for anxiety. ) 60 tablet 5  . amLODipine (NORVASC) 5 MG tablet Take 5 mg by mouth daily.    Marland Kitchen aspirin 81 MG chewable tablet Chew 81 mg by mouth daily.    . diclofenac sodium (VOLTAREN) 1 % GEL Apply 2-4 g topically 4 (four) times daily.    . Ipratropium-Albuterol (COMBIVENT RESPIMAT) 20-100 MCG/ACT AERS respimat Inhale 1-2 puffs into the lungs every 4 (four) hours as needed for wheezing or shortness of breath.    Marland Kitchen ipratropium-albuterol (DUONEB) 0.5-2.5 (3) MG/3ML SOLN Take 3 mLs by nebulization 2 (two) times daily.    Marland Kitchen losartan-hydrochlorothiazide (HYZAAR) 100-12.5 MG tablet Take 1 tablet by mouth daily.    . metFORMIN (GLUCOPHAGE) 500 MG tablet Take 500 mg by mouth 2 (two) times daily with a meal.    . metoprolol succinate (TOPROL-XL) 50 MG 24 hr tablet Take 50 mg by mouth daily. Take with or immediately following a meal.    . Olopatadine HCl (PATADAY) 0.2 % SOLN Place 1 drop into both eyes daily.    Marland Kitchen oxyCODONE (ROXICODONE) 15 MG immediate release tablet Take 15 mg by mouth every 4 (four) hours as needed for pain.  0  . OxyCODONE ER (XTAMPZA ER) 9 MG C12A Take 9 mg by mouth every 12 (twelve) hours.    . terbinafine (LAMISIL) 250 MG tablet Take 250 mg by mouth  daily.    Marland Kitchen zolpidem (AMBIEN) 10 MG tablet Take 10 mg by mouth at bedtime.    . prochlorperazine (COMPAZINE) 10 MG tablet Take 10 mg by mouth every 6 (six) hours as needed for nausea or vomiting.      Home: Home Living Family/patient expects to be discharged to:: Private residence Living Arrangements: Spouse/significant other, Other relatives Available Help at Discharge: Family Type of Home: Mobile home Home Access: Stairs to enter Entrance Stairs-Number of Steps: 4 Entrance Stairs-Rails: Can reach both [place]: One level Bathroom Shower/Tub: Chiropodist: Standard Home Equipment: None   Functional History: Prior Function Level of Independence: Independent Comments: patient cares for grandchildren, states that she rides horses and is very independent. Patient questionable historian at times  Functional Status:  Mobility: Bed Mobility Overal bed mobility: Needs Assistance Bed Mobility: Rolling, Sidelying to Sit Rolling: Min guard Sidelying to sit: Min assist Supine to sit: Mod assist General bed mobility comments: cues for sequencing; assist to elevate trunk into sitting Transfers Overall transfer level: Needs assistance Equipment  used: Rolling walker (2 wheeled) Transfers: Sit to/from Stand Sit to Stand: Min assist General transfer comment: cues for safety; pt began sitting prematurely and required assist to safely sit onto chair  Ambulation/Gait Ambulation/Gait assistance: Min assist Ambulation Distance (Feet): 70 Feet Assistive device: Rolling walker (2 wheeled) Gait Pattern/deviations: Step-to pattern, Decreased stride length, Trunk flexed, Wide base of support, Step-through pattern, Decreased step length - right, Decreased dorsiflexion - right, Shuffle General Gait Details: mod cues for posture, increased bilat step length, and safe use of AD; pt with step through pattern initially but when fatigued began using step to pattern; standing  rest break required; assist to steady and manage RW; pt required extra time and assist to turn around  Gait velocity: decreased  ADL:  Cognition: Cognition Overall Cognitive Status: Impaired/Different from baseline Orientation Level: Oriented X4 Cognition Arousal/Alertness: Awake/alert Behavior During Therapy: WFL for tasks assessed/performed Overall Cognitive Status: Impaired/Different from baseline Area of Impairment: Attention, Following commands, Safety/judgement, Awareness, Problem solving Current Attention Level: Sustained Following Commands: Follows one step commands inconsistently, Follows one step commands with increased time Safety/Judgement: Decreased awareness of safety, Decreased awareness of deficits Awareness: Intellectual Problem Solving: Slow processing, Difficulty sequencing, Requires verbal cues, Requires tactile cues, Decreased initiation General Comments: easily distracted; pt required frequent vc for redirecting to task at hand and following through with tasks; pt a little impulsive at times   Physical Exam: Blood pressure (!) 162/81, pulse 80, temperature 98.4 F (36.9 C), temperature source Oral, resp. rate 20, height 5' 3"  (1.6 m), weight 90.7 kg (200 lb), SpO2 98 %. Physical Exam  Vitals reviewed. HENT:  Head: Normocephalic and atraumatic.  edentulous  Eyes: EOM are normal.  Neck: Normal range of motion. Neck supple. No tracheal deviation present. No thyromegaly present.  Cardiovascular: Normal rate, regular rhythm and normal heart sounds.  Exam reveals no friction rub.   No murmur heard. Respiratory: Effort normal and breath sounds normal. No respiratory distress. She has no wheezes. She has no rales.  GI: Soft. Bowel sounds are normal. She exhibits no distension. There is no tenderness. There is no rebound.  Musculoskeletal: She exhibits no edema or deformity.  Psychiatric: She has a normal mood and affect. Her behavior is normal.  Skin. Warm and  dry Neurological: She is alert.  Makes good eye contact with examiner. She can provide her name but was uncertain of her date of birth. Limited medical historian. She was able to follow simple commands. RUE 3 to 3+/5 RLE 4/5 prox to distal. LUE and LLE 4+ to 5/5. ?decrease of LT RUE and RLE. Speech slightly dysarthric. Language normal   Lab Results Last 48 Hours        Results for orders placed or performed during the hospital encounter of 10/08/16 (from the past 48 hour(s))  Glucose, capillary     Status: Abnormal   Collection Time: 10/09/16  4:28 PM  Result Value Ref Range   Glucose-Capillary 112 (H) 65 - 99 mg/dL  Glucose, capillary     Status: Abnormal   Collection Time: 10/09/16  8:56 PM  Result Value Ref Range   Glucose-Capillary 116 (H) 65 - 99 mg/dL   Comment 1 Notify RN    Comment 2 Document in Chart   Rapid urine drug screen (hospital performed)     Status: Abnormal   Collection Time: 10/10/16  6:46 AM  Result Value Ref Range   Opiates POSITIVE (A) NONE DETECTED   Cocaine NONE DETECTED NONE DETECTED   Benzodiazepines  POSITIVE (A) NONE DETECTED   Amphetamines NONE DETECTED NONE DETECTED   Tetrahydrocannabinol NONE DETECTED NONE DETECTED   Barbiturates NONE DETECTED NONE DETECTED    Comment:        DRUG SCREEN FOR MEDICAL PURPOSES ONLY.  IF CONFIRMATION IS NEEDED FOR ANY PURPOSE, NOTIFY LAB WITHIN 5 DAYS.        LOWEST DETECTABLE LIMITS FOR URINE DRUG SCREEN Drug Class       Cutoff (ng/mL) Amphetamine      1000 Barbiturate      200 Benzodiazepine   149 Tricyclics       702 Opiates          300 Cocaine          300 THC              50   CBC     Status: None   Collection Time: 10/10/16  6:51 AM  Result Value Ref Range   WBC 9.1 4.0 - 10.5 K/uL   RBC 4.33 3.87 - 5.11 MIL/uL   Hemoglobin 13.0 12.0 - 15.0 g/dL   HCT 39.9 36.0 - 46.0 %   MCV 92.1 78.0 - 100.0 fL   MCH 30.0 26.0 - 34.0 pg   MCHC 32.6 30.0 - 36.0 g/dL   RDW 14.6  11.5 - 15.5 %   Platelets 262 150 - 400 K/uL  Glucose, capillary     Status: Abnormal   Collection Time: 10/10/16  6:54 AM  Result Value Ref Range   Glucose-Capillary 100 (H) 65 - 99 mg/dL   Comment 1 Notify RN    Comment 2 Document in Chart   I-STAT, chem 8     Status: Abnormal   Collection Time: 10/10/16  9:45 AM  Result Value Ref Range   Sodium 140 135 - 145 mmol/L   Potassium 2.9 (L) 3.5 - 5.1 mmol/L   Chloride 99 (L) 101 - 111 mmol/L   BUN 17 6 - 20 mg/dL   Creatinine, Ser 0.70 0.44 - 1.00 mg/dL   Glucose, Bld 102 (H) 65 - 99 mg/dL   Calcium, Ion 1.25 1.15 - 1.40 mmol/L   TCO2 30 0 - 100 mmol/L   Hemoglobin 11.9 (L) 12.0 - 15.0 g/dL   HCT 35.0 (L) 36.0 - 46.0 %  Glucose, capillary     Status: Abnormal   Collection Time: 10/10/16 12:00 PM  Result Value Ref Range   Glucose-Capillary 109 (H) 65 - 99 mg/dL  Basic metabolic panel     Status: Abnormal   Collection Time: 10/10/16  1:49 PM  Result Value Ref Range   Sodium 137 135 - 145 mmol/L   Potassium 3.0 (L) 3.5 - 5.1 mmol/L   Chloride 101 101 - 111 mmol/L   CO2 30 22 - 32 mmol/L   Glucose, Bld 179 (H) 65 - 99 mg/dL   BUN 13 6 - 20 mg/dL   Creatinine, Ser 0.72 0.44 - 1.00 mg/dL   Calcium 8.6 (L) 8.9 - 10.3 mg/dL   GFR calc non Af Amer >60 >60 mL/min   GFR calc Af Amer >60 >60 mL/min    Comment: (NOTE) The eGFR has been calculated using the CKD EPI equation. This calculation has not been validated in all clinical situations. eGFR's persistently <60 mL/min signify possible Chronic Kidney Disease.    Anion gap 6 5 - 15  Protime-INR     Status: Abnormal   Collection Time: 10/10/16  1:49 PM  Result Value Ref Range  Prothrombin Time 15.3 (H) 11.4 - 15.2 seconds   INR 1.20   APTT     Status: None   Collection Time: 10/10/16  1:49 PM  Result Value Ref Range   aPTT 32 24 - 36 seconds  Glucose, capillary     Status: Abnormal   Collection Time: 10/10/16  4:52 PM  Result Value  Ref Range   Glucose-Capillary 126 (H) 65 - 99 mg/dL   Comment 1 Notify RN    Comment 2 Document in Chart   Glucose, capillary     Status: Abnormal   Collection Time: 10/10/16  9:02 PM  Result Value Ref Range   Glucose-Capillary 135 (H) 65 - 99 mg/dL   Comment 1 Notify RN    Comment 2 Document in Chart   Glucose, capillary     Status: Abnormal   Collection Time: 10/11/16  6:07 AM  Result Value Ref Range   Glucose-Capillary 140 (H) 65 - 99 mg/dL   Comment 1 Notify RN    Comment 2 Document in Chart   Glucose, capillary     Status: None   Collection Time: 10/11/16 11:10 AM  Result Value Ref Range   Glucose-Capillary 95 65 - 99 mg/dL   Comment 1 Notify RN    Comment 2 Document in Chart       Imaging Results (Last 48 hours)  Ct Angio Head W Or Wo Contrast  Addendum Date: 10/09/2016   ADDENDUM REPORT: 10/09/2016 23:04 ADDENDUM: Critical Value/emergent results were called by telephone at the time of interpretation on 10/09/2016 at 11:04 pm to Dr. Cheral Marker, Neurology, who verbally acknowledged these results. Electronically Signed   By: Elon Alas M.D.   On: 10/09/2016 23:04   Result Date: 10/09/2016 CLINICAL DATA:  Follow-up stroke. History of hypertension, hyperlipidemia and breast cancer. EXAM: CT ANGIOGRAPHY HEAD AND NECK TECHNIQUE: Multidetector CT imaging of the head and neck was performed using the standard protocol during bolus administration of intravenous contrast. Multiplanar CT image reconstructions and MIPs were obtained to evaluate the vascular anatomy. Carotid stenosis measurements (when applicable) are obtained utilizing NASCET criteria, using the distal internal carotid diameter as the denominator. CONTRAST:  50 cc Isovue 370 COMPARISON:  MRI of the head October 08, 2016 FINDINGS: CT HEAD FINDINGS BRAIN: No intraparenchymal hemorrhage, mass effect or midline shift. Focal mesial RIGHT occipital lobe encephalomalacia. Patchy RIGHT parietal  hypodensities. Patchy white matter hypodensities corresponding to acute infarcts. Mild prominence of the ventricles and sulci for patient's age. No abnormal extra-axial fluid collections. VASCULAR: Mild calcific atherosclerosis carotid siphon found vertebral artery's. SKULL/SOFT TISSUES: No skull fracture. No significant soft tissue swelling. ORBITS/SINUSES: The included ocular globes and orbital contents are normal.Minimal RIGHT sphenoid mucosal thickening. No paranasal sinus air-fluid levels. Mastoid air cells are well aerated. OTHER: Patient is edentulous. CTA NECK AORTIC ARCH: Mild intimal thickening calcific atherosclerosis. 2.9 cm segment LEFT subclavian artery origin occlusion with expanded appearance, reconstitution at medially proximal to LEFT vertebral artery takeoff which is patent. Innominate and LEFT Common carotid artery origins are widely patent. Mild stenosis RIGHT proximal subclavian artery. RIGHT CAROTID SYSTEM: Common carotid artery is widely patent. Normal appearance of the carotid bifurcation without hemodynamically significant stenosis by NASCET criteria, mild intimal thickening. Normal appearance of the included internal carotid artery. LEFT CAROTID SYSTEM: Common carotid artery is widely patent. Normal appearance of the carotid bifurcation without hemodynamically significant stenosis by NASCET criteria, mild calcific atherosclerosis. Normal appearance of the included internal carotid artery. VERTEBRAL ARTERIES:Codominant vertebral artery's. Moderate stenosis  RIGHT vertebral artery origin. Bilateral vertebral artery's are patent throughout the course. SKELETON: No acute osseous process though bone windows have not been submitted. OTHER NECK: Soft tissues of the neck are non-acute though, not tailored for evaluation. Centrilobular emphysema included lung apices. CTA HEAD ANTERIOR CIRCULATION: Patent cervical internal carotid arteries, petrous, cavernous and supra clinoid internal carotid  arteries. 3 mm posteriorly directed LEFT posterior communicating origin at ICA. Calcific atherosclerosis resulting in 2 mm segment severe stenosis LEFT para clinoid internal carotid artery, moderate stenosis RIGHT paraclinoid internal carotid artery. Thready bilateral A1 segments, patent anterior communicating artery. Severe stenosis LEFT A2 segment. Moderate luminal irregularity bilateral middle cerebral artery's including moderate stenosis proximal RIGHT M1 segment. Severe focal stenosis LEFT M3 segment. No large vessel occlusion, contrast extravasation or aneurysm. POSTERIOR CIRCULATION: Patent vertebral arteries, vertebrobasilar junction and basilar artery, as well as main branch vessels. Mildly stenotic LEFT vertebral artery due to the calcific atherosclerosis. Patent posterior cerebral arteries, moderate luminal irregularity. No large vessel occlusion, contrast extravasation. VENOUS SINUSES: Major dural venous sinuses are patent though not tailored for evaluation on this angiographic examination. ANATOMIC VARIANTS: None. DELAYED PHASE: No abnormal intracranial enhancement MIP images reviewed. IMPRESSION: CT HEAD: 1. Evolving small acute watershed and RIGHT MCA territory infarcts without hemorrhagic conversion. 2. Old small RIGHT frontal/ACA territory infarct. 3. Mild parenchymal brain volume loss for age. CTA NECK: 1. 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 Reconstitution proximal to LEFT vertebral artery concerning for steal. 2. Atherosclerosis without hemodynamically significant stenosis carotid artery's. 3. Moderate stenosis RIGHT vertebral artery origin. CTA HEAD: 1. No emergent large vessel occlusion. 2. Severe stenoses LEFT paraclinoid ICA, LEFT anterior cerebral artery and LEFT middle cerebral artery. 3. Moderate general cerebral artery atherosclerosis and stenoses. Electronically Signed: By: Elon Alas M.D. On: 10/09/2016 22:47   Ct Angio Neck W Or Wo Contrast  Addendum  Date: 10/09/2016   ADDENDUM REPORT: 10/09/2016 23:04 ADDENDUM: Critical Value/emergent results were called by telephone at the time of interpretation on 10/09/2016 at 11:04 pm to Dr. Cheral Marker, Neurology, who verbally acknowledged these results. Electronically Signed   By: Elon Alas M.D.   On: 10/09/2016 23:04   Result Date: 10/09/2016 CLINICAL DATA:  Follow-up stroke. History of hypertension, hyperlipidemia and breast cancer. EXAM: CT ANGIOGRAPHY HEAD AND NECK TECHNIQUE: Multidetector CT imaging of the head and neck was performed using the standard protocol during bolus administration of intravenous contrast. Multiplanar CT image reconstructions and MIPs were obtained to evaluate the vascular anatomy. Carotid stenosis measurements (when applicable) are obtained utilizing NASCET criteria, using the distal internal carotid diameter as the denominator. CONTRAST:  50 cc Isovue 370 COMPARISON:  MRI of the head October 08, 2016 FINDINGS: CT HEAD FINDINGS BRAIN: No intraparenchymal hemorrhage, mass effect or midline shift. Focal mesial RIGHT occipital lobe encephalomalacia. Patchy RIGHT parietal hypodensities. Patchy white matter hypodensities corresponding to acute infarcts. Mild prominence of the ventricles and sulci for patient's age. No abnormal extra-axial fluid collections. VASCULAR: Mild calcific atherosclerosis carotid siphon found vertebral artery's. SKULL/SOFT TISSUES: No skull fracture. No significant soft tissue swelling. ORBITS/SINUSES: The included ocular globes and orbital contents are normal.Minimal RIGHT sphenoid mucosal thickening. No paranasal sinus air-fluid levels. Mastoid air cells are well aerated. OTHER: Patient is edentulous. CTA NECK AORTIC ARCH: Mild intimal thickening calcific atherosclerosis. 2.9 cm segment LEFT subclavian artery origin occlusion with expanded appearance, reconstitution at medially proximal to LEFT vertebral artery takeoff which is patent. Innominate and LEFT Common  carotid artery origins are widely patent. Mild stenosis RIGHT  proximal subclavian artery. RIGHT CAROTID SYSTEM: Common carotid artery is widely patent. Normal appearance of the carotid bifurcation without hemodynamically significant stenosis by NASCET criteria, mild intimal thickening. Normal appearance of the included internal carotid artery. LEFT CAROTID SYSTEM: Common carotid artery is widely patent. Normal appearance of the carotid bifurcation without hemodynamically significant stenosis by NASCET criteria, mild calcific atherosclerosis. Normal appearance of the included internal carotid artery. VERTEBRAL ARTERIES:Codominant vertebral artery's. Moderate stenosis RIGHT vertebral artery origin. Bilateral vertebral artery's are patent throughout the course. SKELETON: No acute osseous process though bone windows have not been submitted. OTHER NECK: Soft tissues of the neck are non-acute though, not tailored for evaluation. Centrilobular emphysema included lung apices. CTA HEAD ANTERIOR CIRCULATION: Patent cervical internal carotid arteries, petrous, cavernous and supra clinoid internal carotid arteries. 3 mm posteriorly directed LEFT posterior communicating origin at ICA. Calcific atherosclerosis resulting in 2 mm segment severe stenosis LEFT para clinoid internal carotid artery, moderate stenosis RIGHT paraclinoid internal carotid artery. Thready bilateral A1 segments, patent anterior communicating artery. Severe stenosis LEFT A2 segment. Moderate luminal irregularity bilateral middle cerebral artery's including moderate stenosis proximal RIGHT M1 segment. Severe focal stenosis LEFT M3 segment. No large vessel occlusion, contrast extravasation or aneurysm. POSTERIOR CIRCULATION: Patent vertebral arteries, vertebrobasilar junction and basilar artery, as well as main branch vessels. Mildly stenotic LEFT vertebral artery due to the calcific atherosclerosis. Patent posterior cerebral arteries, moderate luminal  irregularity. No large vessel occlusion, contrast extravasation. VENOUS SINUSES: Major dural venous sinuses are patent though not tailored for evaluation on this angiographic examination. ANATOMIC VARIANTS: None. DELAYED PHASE: No abnormal intracranial enhancement MIP images reviewed. IMPRESSION: CT HEAD: 1. Evolving small acute watershed and RIGHT MCA territory infarcts without hemorrhagic conversion. 2. Old small RIGHT frontal/ACA territory infarct. 3. Mild parenchymal brain volume loss for age. CTA NECK: 1. 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 Reconstitution proximal to LEFT vertebral artery concerning for steal. 2. Atherosclerosis without hemodynamically significant stenosis carotid artery's. 3. Moderate stenosis RIGHT vertebral artery origin. CTA HEAD: 1. No emergent large vessel occlusion. 2. Severe stenoses LEFT paraclinoid ICA, LEFT anterior cerebral artery and LEFT middle cerebral artery. 3. Moderate general cerebral artery atherosclerosis and stenoses. Electronically Signed: By: Elon Alas M.D. On: 10/09/2016 22:47   Ir Angiogram Extremity Left  Result Date: 10/11/2016 CLINICAL DATA:  Left-sided weakness with confusion. Right hemispheric watershed ischemic strokes. Abnormal MRI of the brain. EXAM: BILATERAL COMMON CAROTID AND INNOMINATE ANGIOGRAPHY; LEFT EXTREMITY ARTERIOGRAPHY; IR ANGIO VERTEBRAL SEL SUBCLAVIAN INNOMINATE UNI RIGHT MOD SED COMPARISON:  MRI of the brain of 10/08/2016, and CT angiogram of the brain of 10/09/2016. MEDICATIONS: Heparin 1500 units IV; no antibiotic was administered within 1 hour of the procedure. ANESTHESIA/SEDATION: Versed 0.5 mg IV; Fentanyl 50 mcg IV. Moderate Sedation Time:  28 minutes. The patient was continuously monitored during the procedure by the interventional radiology nurse under my direct supervision. CONTRAST:  Isovue 300 approximately 55 mL. FLUOROSCOPY TIME:  Fluoroscopy Time: 9 minutes 54 seconds (971 mGy). COMPLICATIONS: None  immediate. TECHNIQUE: Informed written consent was obtained from the patient after a thorough discussion of the procedural risks, benefits and alternatives. All questions were addressed. Maximal Sterile Barrier Technique was utilized including caps, mask, sterile gowns, sterile gloves, sterile drape, hand hygiene and skin antiseptic. A timeout was performed prior to the initiation of the procedure. The right groin was prepped and draped in the usual sterile fashion. Thereafter using modified Seldinger technique, transfemoral access into the right common femoral artery was obtained without difficulty. Over  a 0.035 inch guidewire, a 5 French Pinnacle sheath was inserted. Through this, and also over 0.035 inch guidewire, a 5 [place] JB 1 catheter was advanced to the aortic arch region and selectively positioned in the right common carotid artery, , the right subclavian artery, the left common carotid artery and the left subclavian artery. FINDINGS: The right common carotid arteriogram demonstrates the right external carotid artery and its major branches to widely patent. The right internal carotid artery has a smooth shallow plaque along its medial wall without associated ulcerations or stenosis by the NASCET criteria. Distal to this the vessel is seen to opacify normally to the cranial skull base. The petrous, cavernous and supraclinoid segments are widely patent. Transient opacification via the right posterior communicating artery of the right posterior cerebral artery distribution is seen. The right middle cerebral artery and the proximal M1 segment demonstrates 70% focal stenosis due to a smooth circumferential plaque. Distal to this there is mild post stenotic dilatation. The right middle cerebral artery trifurcation branches are seen to opacify normally into the capillary and venous phases. There is approximately 50-70% stenoses of the anterior cerebral artery proximal A1 segment. Distal to this flow is noted into  the right anterior cerebral artery into the pericallosal and the callosal marginal branches. In flow of non-opacified blood into the right anterior cerebral artery A2 segment is seen via the anterior communicating artery from the left internal carotid artery. The right vertebral artery origin demonstrates a 50% stenosis at its origin. Distal to this the vessel is seen to opacify normally to the cranial skull base. Normal opacification is seen of the right vertebrobasilar junction and the right posterior-inferior cerebellar artery. The basilar artery demonstrates approximately 30% stenosis in its mid basilar artery distal to the anterior-inferior cerebellar arteries, and of a 50% proximal to the termination into the posterior cerebral arteries. The posterior cerebral arteries, superior cerebellar arteries and the anterior-inferior cerebellar arteries are seen to opacify into the capillary and venous phases. The left common carotid arteriogram demonstrates the left external carotid artery and its major branches to be widely patent. There is a smooth shallow plaque along the anterior wall of the left common carotid artery just proximal to its bifurcation. The left internal carotid artery at the bulb is widely patent. There is mild smooth caliber irregularity of the left internal carotid artery just distal to the bulb which probably represents mild FMD-like changes. Distal to this the left internal carotid artery is seen to opacify to the cranial skull base. The petrous and the proximal cavernous segments are widely patent. There is approximately 65% stenosis of the left internal carotid artery, proximal supraclinoid segment. The ophthalmic artery is widely patent. The left middle cerebral artery demonstrates mild narrowing in its M1 segment. The left MCA trifurcation branches are widely patent into the capillary and venous phases. The left anterior cerebral artery A1 segment demonstrates approximately 50-70% stenoses.  Distal to this the left anterior cerebral A2 segment demonstrates normal opacification into the capillary and venous phases. There is prompt opacification via the anterior communicating artery of the right anterior cerebral artery A2 segment and distally. Also demonstrated is a conical shaped outpouching in the region of the left posterior communicating artery most compatible with an infundibulum. The left subclavian arteriogram demonstrates a severe high-grade focal stenosis distal to its origin associated with filling defects and slow opacification of the left subclavian artery distally. 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 Approximately 50% stenosis of the right vertebral artery at its origin. Approximately 65% stenosis of the left internal carotid artery supraclinoid segment. Approximately 70% stenosis of the right middle cerebral artery M1 segment proximally. Approximately 50-70% stenoses of the anterior cerebral arteries A1 segments bilaterally. PLAN: Angiographic findings were reviewed with the patient. Electronically Signed   By: Luanne Bras M.D.   On: 10/10/2016 18:24   Ir Angio Intra Extracran Sel Com Carotid Innominate Bilat Mod Sed  Result Date: 10/11/2016 CLINICAL DATA:  Left-sided weakness with confusion. Right hemispheric watershed ischemic strokes. Abnormal MRI of the brain. EXAM: BILATERAL COMMON CAROTID AND INNOMINATE ANGIOGRAPHY; LEFT EXTREMITY ARTERIOGRAPHY; IR ANGIO VERTEBRAL SEL SUBCLAVIAN INNOMINATE UNI RIGHT MOD SED COMPARISON:  MRI of the brain of 10/08/2016, and CT angiogram of the brain of 10/09/2016. MEDICATIONS: Heparin 1500 units IV; no antibiotic was administered within 1 hour of the procedure. ANESTHESIA/SEDATION: Versed 0.5 mg IV; Fentanyl 50 mcg  IV. Moderate Sedation Time:  28 minutes. The patient was continuously monitored during the procedure by the interventional radiology nurse under my direct supervision. CONTRAST:  Isovue 300 approximately 55 mL. FLUOROSCOPY TIME:  Fluoroscopy Time: 9 minutes 54 seconds (971 mGy). COMPLICATIONS: None immediate. TECHNIQUE: Informed written consent was obtained from the patient after a thorough discussion of the procedural risks, benefits and alternatives. All questions were addressed. Maximal Sterile Barrier Technique was utilized including caps, mask, sterile gowns, sterile gloves, sterile drape, hand hygiene and skin antiseptic. A timeout was performed prior to the initiation of the procedure. The right groin was prepped and draped in the usual sterile fashion. Thereafter using modified Seldinger technique, transfemoral access into the right common femoral artery was obtained without difficulty. Over a 0.035 inch guidewire, a 5 French Pinnacle sheath was inserted. Through this, and also over 0.035 inch guidewire, a 5 [place] JB 1 catheter was advanced to the aortic arch region and selectively positioned in the right common carotid artery, , the right subclavian artery, the left common carotid artery and the left subclavian artery. FINDINGS: The right common carotid arteriogram demonstrates the right external carotid artery and its major branches to widely patent. The right internal carotid artery has a smooth shallow plaque along its medial wall without associated ulcerations or stenosis by the NASCET criteria. Distal to this the vessel is seen to opacify normally to the cranial skull base. The petrous, cavernous and supraclinoid segments are widely patent. Transient opacification via the right posterior communicating artery of the right posterior cerebral artery distribution is seen. The right middle cerebral artery and the proximal M1 segment demonstrates 70% focal stenosis due to a smooth circumferential plaque.  Distal to this there is mild post stenotic dilatation. The right middle cerebral artery trifurcation branches are seen to opacify normally into the capillary and venous phases. There is approximately 50-70% stenoses of the anterior cerebral artery proximal A1 segment. Distal to this flow is noted into the right anterior cerebral artery into the pericallosal and the callosal marginal branches. In flow of non-opacified blood into the right anterior cerebral artery A2 segment is seen via the anterior communicating artery from the left internal carotid artery. The right vertebral artery origin demonstrates a 50% stenosis at its origin. Distal to this the vessel is seen to opacify normally to the cranial skull base. Normal opacification is seen of the right vertebrobasilar junction and the  right posterior-inferior cerebellar artery. The basilar artery demonstrates approximately 30% stenosis in its mid basilar artery distal to the anterior-inferior cerebellar arteries, and of a 50% proximal to the termination into the posterior cerebral arteries. The posterior cerebral arteries, superior cerebellar arteries and the anterior-inferior cerebellar arteries are seen to opacify into the capillary and venous phases. The left common carotid arteriogram demonstrates the left external carotid artery and its major branches to be widely patent. There is a smooth shallow plaque along the anterior wall of the left common carotid artery just proximal to its bifurcation. The left internal carotid artery at the bulb is widely patent. There is mild smooth caliber irregularity of the left internal carotid artery just distal to the bulb which probably represents mild FMD-like changes. Distal to this the left internal carotid artery is seen to opacify to the cranial skull base. The petrous and the proximal cavernous segments are widely patent. There is approximately 65% stenosis of the left internal carotid artery, proximal supraclinoid  segment. The ophthalmic artery is widely patent. The left middle cerebral artery demonstrates mild narrowing in its M1 segment. The left MCA trifurcation branches are widely patent into the capillary and venous phases. The left anterior cerebral artery A1 segment demonstrates approximately 50-70% stenoses. Distal to this the left anterior cerebral A2 segment demonstrates normal opacification into the capillary and venous phases. There is prompt opacification via the anterior communicating artery of the right anterior cerebral artery A2 segment and distally. Also demonstrated is a conical shaped outpouching in the region of the left posterior communicating artery most compatible with an infundibulum. The left subclavian arteriogram demonstrates a severe high-grade focal stenosis distal to its origin associated with filling defects and slow opacification of the left subclavian artery distally. 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 Approximately 50% stenosis of the right vertebral artery at its origin. Approximately 65% stenosis of the left internal carotid artery supraclinoid segment. Approximately 70% stenosis of the right middle cerebral artery M1 segment proximally. Approximately 50-70% stenoses of the anterior cerebral arteries A1 segments bilaterally. PLAN: Angiographic findings were reviewed with the patient. Electronically Signed   By: Luanne Bras M.D.   On: 10/10/2016 18:24   Ir Angio Vertebral Sel Subclavian Innominate Uni R Mod Sed  Result Date: 10/11/2016 CLINICAL DATA:  Left-sided weakness with confusion. Right hemispheric watershed ischemic strokes. Abnormal MRI of the brain. EXAM: BILATERAL COMMON CAROTID AND INNOMINATE ANGIOGRAPHY; LEFT EXTREMITY ARTERIOGRAPHY; IR  ANGIO VERTEBRAL SEL SUBCLAVIAN INNOMINATE UNI RIGHT MOD SED COMPARISON:  MRI of the brain of 10/08/2016, and CT angiogram of the brain of 10/09/2016. MEDICATIONS: Heparin 1500 units IV; no antibiotic was administered within 1 hour of the procedure. ANESTHESIA/SEDATION: Versed 0.5 mg IV; Fentanyl 50 mcg IV. Moderate Sedation Time:  28 minutes. The patient was continuously monitored during the procedure by the interventional radiology nurse under my direct supervision. CONTRAST:  Isovue 300 approximately 55 mL. FLUOROSCOPY TIME:  Fluoroscopy Time: 9 minutes 54 seconds (971 mGy). COMPLICATIONS: None immediate. TECHNIQUE: Informed written consent was obtained from the patient after a thorough discussion of the procedural risks, benefits and alternatives. All questions were addressed. Maximal Sterile Barrier Technique was utilized including caps, mask, sterile gowns, sterile gloves, sterile drape, hand hygiene and skin antiseptic. A timeout was performed prior to the initiation of the  procedure. The right groin was prepped and draped in the usual sterile fashion. Thereafter using modified Seldinger technique, transfemoral access into the right common femoral artery was obtained without difficulty. Over a 0.035 inch guidewire, a 5 French Pinnacle sheath was inserted. Through this, and also over 0.035 inch guidewire, a 5 [place] JB 1 catheter was advanced to the aortic arch region and selectively positioned in the right common carotid artery, , the right subclavian artery, the left common carotid artery and the left subclavian artery. FINDINGS: The right common carotid arteriogram demonstrates the right external carotid artery and its major branches to widely patent. The right internal carotid artery has a smooth shallow plaque along its medial wall without associated ulcerations or stenosis by the NASCET criteria. Distal to this the vessel is seen to opacify normally to the cranial skull base. The petrous, cavernous and  supraclinoid segments are widely patent. Transient opacification via the right posterior communicating artery of the right posterior cerebral artery distribution is seen. The right middle cerebral artery and the proximal M1 segment demonstrates 70% focal stenosis due to a smooth circumferential plaque. Distal to this there is mild post stenotic dilatation. The right middle cerebral artery trifurcation branches are seen to opacify normally into the capillary and venous phases. There is approximately 50-70% stenoses of the anterior cerebral artery proximal A1 segment. Distal to this flow is noted into the right anterior cerebral artery into the pericallosal and the callosal marginal branches. In flow of non-opacified blood into the right anterior cerebral artery A2 segment is seen via the anterior communicating artery from the left internal carotid artery. The right vertebral artery origin demonstrates a 50% stenosis at its origin. Distal to this the vessel is seen to opacify normally to the cranial skull base. Normal opacification is seen of the right vertebrobasilar junction and the right posterior-inferior cerebellar artery. The basilar artery demonstrates approximately 30% stenosis in its mid basilar artery distal to the anterior-inferior cerebellar arteries, and of a 50% proximal to the termination into the posterior cerebral arteries. The posterior cerebral arteries, superior cerebellar arteries and the anterior-inferior cerebellar arteries are seen to opacify into the capillary and venous phases. The left common carotid arteriogram demonstrates the left external carotid artery and its major branches to be widely patent. There is a smooth shallow plaque along the anterior wall of the left common carotid artery just proximal to its bifurcation. The left internal carotid artery at the bulb is widely patent. There is mild smooth caliber irregularity of the left internal carotid artery just distal to the bulb which  probably represents mild FMD-like changes. Distal to this the left internal carotid artery is seen to opacify to the cranial skull base. The petrous and the proximal cavernous segments are widely patent. There is approximately 65% stenosis of the left internal carotid artery, proximal supraclinoid segment. The ophthalmic artery is widely patent. The left middle cerebral artery demonstrates mild narrowing in its M1 segment. The left MCA trifurcation branches are widely patent into the capillary and venous phases. The left anterior cerebral artery A1 segment demonstrates approximately 50-70% stenoses. Distal to this the left anterior cerebral A2 segment demonstrates normal opacification into the capillary and venous phases. There is prompt opacification via the anterior communicating artery of the right anterior cerebral artery A2 segment and distally. Also demonstrated is a conical shaped outpouching in the region of the left posterior communicating artery most compatible with an infundibulum. The left subclavian arteriogram demonstrates a severe high-grade focal stenosis distal  to its origin associated with filling defects and slow opacification of the left subclavian artery distally. 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 Approximately 50% stenosis of the right vertebral artery at its origin. Approximately 65% stenosis of the left internal carotid artery supraclinoid segment. Approximately 70% stenosis of the right middle cerebral artery M1 segment proximally. Approximately 50-70% stenoses of the anterior cerebral arteries A1 segments bilaterally. PLAN: Angiographic findings were reviewed with the patient. Electronically Signed   By: Luanne Bras M.D.   On: 10/10/2016 18:24         Medical Problem List and Plan: 1.  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  DVT Prophylaxis/Anticoagulation: Lovenox 40 mg daily. Monitor for any bleeding episodes 3. Pain Management/chronic back pain: OxyContin sustained release 10 mg every 12 hours, oxycodone immediate release as needed. Monitor mental status with medication 4. Mood: Xanax 1 mg twice a day as needed 5. Neuropsych: This patient is capable of making decisions on her own behalf. 6. Skin/Wound Care: Routine skin checks 7. Fluids/Electrolytes/Nutrition: Routine I&O with follow-up chemistries 8. 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 Follow-up outpatient interventional radiology for possible elective angioplasty versus stenting 9. Hypertension. No current antihypertensive medication. Monitor with increased mobility. Patient on Toprol 50 mg daily, Hyzaar 100-12 0.5 mg daily, Norvasc 5 mg daily prior to admission. Resume as needed 10. Diabetes mellitus with peripheral neuropathy. Hemoglobin A1c 6.5.SSI. Check blood sugars before meals and at bedtime. Diabetic teaching. Patient on Glucophage 500 mg twice a day prior to admission 11. Hyperlipidemia. Lipitor 12. Tobacco abuse. Counseling 13. History of breast cancer. Currently in remission.   Post Admission Physician Evaluation: 1. Functional deficits secondary  to bilateral fronto-parietal infarcts. 2. Patient is admitted to receive collaborative, interdisciplinary care between the physiatrist, rehab nursing staff, and therapy team. 3. Patient's level of medical complexity and substantial therapy needs in context of that medical necessity cannot be provided at a lesser intensity of care such as a SNF. 4. Patient has experienced substantial functional loss from his/her baseline which was documented above under the "Functional History" and "Functional Status" headings.   Judging by the patient's diagnosis, physical exam, and functional history, the patient has potential for functional progress which will result in measurable gains while on inpatient rehab.  These gains will be of substantial and practical use upon discharge  in facilitating mobility and self-care at the household level. 5. Physiatrist will provide 24 hour management of medical needs as well as oversight of the therapy plan/treatment and provide guidance as appropriate regarding the interaction of the two. 6. The Preadmission Screening has been reviewed and patient status is unchanged unless otherwise stated above. 7. 24 hour rehab nursing will assist with bladder management, bowel management, safety, skin/wound care, disease management, medication administration, pain management and patient education  and help integrate therapy concepts, techniques,education, etc. 8. PT will assess and treat for/with: Lower extremity strength, range of motion, stamina, balance, functional mobility, safety, adaptive techniques and equipment, NMR, family education.   Goals are: mod I. 9. OT will assess and treat for/with: ADL's, functional mobility, safety, upper extremity strength, adaptive techniques and equipment, NMR, community reentry.   Goals are: mod I. Therapy  mayt proceed with showering this patient. 10. SLP will assess and treat for/with: cognition, communication.  Goals are: mod I. 11. Case Management and Social Worker will assess and treat for psychological issues and discharge planning. 12. Team conference will be held weekly to assess progress toward goals and to determine barriers to discharge. 13. Patient will receive at least 3 hours of therapy per day at least 5 days per week. 14. ELOS: 8-12 days       15. Prognosis:  excellent     [name], MD, [facility] 10/11/2016  Cathlyn Parsons., PA-C 10/11/2016

## 2016-10-11 NOTE — Care Management Note (Signed)
Case Management Note  Patient Details  Name: [name] MRN: [id number] Date of Birth: 10-Sep-1960  Subjective/Objective:    Pt admitted with CVA. She is from home with her boyfriend. Pt bedridden prior to admission with skin issues.                 Action/Plan: PT recommending CIR. Awaiting evaluation. CM following for d/c disposition.   Expected Discharge Date:                  Expected Discharge Plan:  [place]  In-House Referral:     Discharge planning Services  CM Consult  Post Acute Care Choice:    Choice offered to:     DME Arranged:    DME Agency:     HH Arranged:    [place] Agency:     Status of Service:  In process, will continue to follow  If discussed at Long Length of Stay Meetings, dates discussed:    Additional Comments:  Pollie Friar, RN 10/11/2016, 10:10 AM

## 2016-10-11 NOTE — Evaluation (Signed)
Occupational Therapy Evaluation Patient Details Name: [name] MRN: [id number] DOB: Aug 24, 1960 Today's Date: 10/11/2016    History of Present Illness patient is a 56 yo female who presents with stroke like deficits. 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 Patient s/p IR procedure on 10/10/16.   Clinical Impression   PTA, pt reports that she was independent and living with her boyfriend. Pt currently performing simple ADLs and functional mobility with Min A and Mod-Max cues. Pt presenting with poor attention, problem solving, safety awareness, and coordination. Recommend dc to CIR for further intense therapy to optimize safety and independence with ADLs and functional mobility prior to transitioning home. Answered all pt questions and provided education in preparation for dc later today.     Follow Up Recommendations  CIR;Supervision/Assistance - 24 hour    Equipment Recommendations  Other (comment) (Defer to next venue)    Recommendations for Other Services       Precautions / Restrictions Precautions Precautions: Fall Restrictions Weight Bearing Restrictions: No      Mobility Bed Mobility Overal bed mobility: Needs Assistance Bed Mobility: Supine to Sit;Sit to Supine     Supine to sit: Min assist Sit to supine: Min assist   General bed mobility comments: cues for sequencing; assist to elevate trunk into sitting  Transfers Overall transfer level: Needs assistance Equipment used: Rolling walker (2 wheeled) Transfers: Sit to/from Stand Sit to Stand: Min assist         General transfer comment: Cues for safety. Min A for safety and management RW    Balance Overall balance assessment: Needs assistance Sitting-balance support: Bilateral upper extremity supported;Feet supported Sitting balance-Leahy Scale: Fair     Standing balance support: During functional activity;No upper extremity  supported Standing balance-Leahy Scale: Fair Standing balance comment: stand at sink maintaining static standing without UE support.                           ADL either performed or assessed with clinical judgement   ADL Overall ADL's : Needs assistance/impaired Eating/Feeding: Set up;Sitting   Grooming: Oral care;Min guard;Cueing for sequencing;Standing Grooming Details (indicate cue type and reason): Mod-Max question cues for safety, problem solving, and sequencing during grooming task.  Upper Body Bathing: Minimal assistance   Lower Body Bathing: Minimal assistance;Sit to/from stand   Upper Body Dressing : Minimal assistance;Sitting   Lower Body Dressing: Minimal assistance;Sit to/from stand               Functional mobility during ADLs: Min guard;Rolling walker General ADL Comments: Pt demonstrating decreased attention, safety awareness, and problem solving throughout session and ADLs. Pt would benefit from further OT to optimize safety and functional performance.      Vision Baseline Vision/History: Wears glasses Wears Glasses: Reading only Patient Visual Report: No change from baseline;Other (comment) (No visual deficits noted; need further assessment)       Perception     Praxis      Pertinent Vitals/Pain Pain Assessment: Faces Faces Pain Scale: No hurt Pain Intervention(s): Monitored during session     Hand Dominance Right   Extremity/Trunk Assessment Upper Extremity Assessment Upper Extremity Assessment: RUE deficits/detail RUE Deficits / Details: RUE weaker than LUE. Pt able to move against gravy during grooming task at sink. Demonstrating good FM to zip her hand bag (Bilateral gross grasp strength WFL)   Lower Extremity Assessment Lower Extremity Assessment: Defer to PT evaluation  Cervical / Trunk Assessment Cervical / Trunk Assessment: Normal   Communication Communication Communication: No difficulties   Cognition  Arousal/Alertness: Awake/alert Behavior During Therapy: WFL for tasks assessed/performed Overall Cognitive Status: Impaired/Different from baseline Area of Impairment: Attention;Following commands;Safety/judgement;Awareness;Problem solving                   Current Attention Level: Sustained   Following Commands: Follows one step commands inconsistently;Follows one step commands with increased time Safety/Judgement: Decreased awareness of safety;Decreased awareness of deficits Awareness: Intellectual Problem Solving: Slow processing;Difficulty sequencing;Requires verbal cues;Requires tactile cues;Decreased initiation General Comments: Pt easliy distracted throughout session. For example, when pt discribed fall this AM pt ended by talking about racial discrimination among her grandchildren   General Comments  Rash under right arm    Exercises     Shoulder Instructions      Home Living Family/patient expects to be discharged to:: Private residence Living Arrangements: Spouse/significant other (Boyfriends) Available Help at Discharge: Family Type of Home: Mobile home Home Access: Stairs to enter Technical brewer of Steps: 4 Entrance Stairs-Rails: Can reach both [place]: One level     Bathroom Shower/Tub: Teacher, early years/pre: Standard     Home Equipment: None          Prior Functioning/Environment Level of Independence: Independent        Comments: Performs ADLs, IADLs, and cares for grandchildren. Patient questionable historian at times. Pt reports that she has horses and rides them.        OT Problem List: Decreased activity tolerance;Impaired balance (sitting and/or standing);Decreased safety awareness;Decreased cognition;Decreased coordination;Decreased knowledge of use of DME or AE;Decreased knowledge of precautions;Impaired UE functional use      OT Treatment/Interventions:      OT Goals(Current goals can be found in the care  plan section) Acute Rehab OT Goals Patient Stated Goal: to take care of the kids OT Goal Formulation: With patient Time For Goal Achievement: 10/25/16 Potential to Achieve Goals: Good  OT Frequency:     Barriers to D/C:            Co-evaluation              AM-PAC PT "6 Clicks" Daily Activity     Outcome Measure Help from another person eating meals?: A Little Help from another person taking care of personal grooming?: A Lot Help from another person toileting, which includes using toliet, bedpan, or urinal?: A Little Help from another person bathing (including washing, rinsing, drying)?: A Lot Help from another person to put on and taking off regular upper body clothing?: A Little Help from another person to put on and taking off regular lower body clothing?: A Little 6 Click Score: 16   End of Session Equipment Utilized During Treatment: Gait belt;Rolling walker Nurse Communication: Mobility status  Activity Tolerance: Patient tolerated treatment well Patient left: in bed;with call bell/phone within reach;with bed alarm set  OT Visit Diagnosis: Unsteadiness on feet (R26.81);Other abnormalities of gait and mobility (R26.89);Hemiplegia and hemiparesis Hemiplegia - Right/Left: Right Hemiplegia - dominant/non-dominant: Dominant Hemiplegia - caused by: Cerebral infarction                Time: 9242-6834 OT Time Calculation (min): 22 min Charges:  OT General Charges $OT Visit: 1 Procedure OT Evaluation $OT Eval Moderate Complexity: 1 Procedure G-Codes:     [place], OTR/L Acute Rehab Pager: [phone] Office: [place] 10/11/2016, 4:59 PM

## 2016-10-11 NOTE — Progress Notes (Signed)
Retta Diones, RN Rehab Admission Coordinator Signed Physical Medicine and Rehabilitation  PMR Pre-admission Date of Service: 10/11/2016 2:44 PM  Related encounter: ED to Hosp-Admission (Current) from 10/08/2016 in [place]       [] Hide copied text PMR Admission Coordinator Pre-Admission Assessment  Patient: [name] is an 56 y.o., female MRN: [id number] DOB: [birth year] Height: 5\' 3"  (160 cm) Weight: 90.7 kg (200 lb)                                                                                                                                                  Insurance Information HMO: No  PPO:       PCP:       IPA:       80/20:       OTHER:   PRIMARY:  Medicaid [place] access      Policy#:  [id number]      Subscriber:  Patient CM Name:        Phone#:       Fax#:   Pre-Cert#:        Employer: Disabled Benefits:  Phone #: [phone]     Name:  Automated Eff. 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 Presented 10/09/2016 with right side weakness, slurred speechas well as recent fall and had been essentially bedbound for the past week or so. Per chart review patient lives with boyfriend. One level home for steps to entry. Independent prior to admission. Cranial CT scan showed no evidence of acute infarction. Blood  pressure 88/40. Urine drug screen positive for opiates and benzos. MRI of the brain showed numerous acute small frontoparietal infarcts including the small vessel and MCA territories. MRI cervical spine without neurocompression. CT angiogram of head and neck showed no emergent large vessel occlusion. 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 Lower extremity Dopplers negative. Patient did not receive TPA. Cerebral angiogram 10/10/2016 per interventional radiology showed  approximately 70% stenosis right MCA proximal M1 segment. Approximately 50% stenosis of right VA origin. Echocardiogram with ejection fraction 65% no wall motion abnormalities. Presently on aspirin for CVA prophylaxis. Subcutaneous Lovenox for DVT prophylaxis. Physical and occupational therapy evaluations completed with recommendations of physical medicine rehabilitation consult.    Total: 6=NIH  Past Medical History      Past Medical History:  Diagnosis Date  . Asthma   . Back muscle spasm 12/13/2010  . Cancer [facility])    breast cancer, remission 2010  . Chronic back pain   . Chronic foot pain   . COPD (chronic obstructive pulmonary disease) ([place]) 09/14/2010  . H/O MRSA infection 09/14/2010  . High blood cholesterol   . Hyperlipidemia 12/07/2012  . Hypertension   . 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 Psoriasis 12/13/2010  . Tobacco abuse 12/07/2012    Family History  family history includes Cancer in her maternal aunt, maternal aunt, maternal aunt, and maternal grandmother; Stroke in her father and mother.  Prior Rehab/Hospitalizations: No previous rehab.  Has the patient had major surgery during 100 days prior to admission? No  Current Medications   Current Facility-Administered Medications:  .   stroke: mapping our early stages of recovery book, , Does not apply, Once, Norval Morton, MD, Stopped at 10/09/16 0800 .  0.9 %  sodium chloride  infusion, , Intravenous, Continuous, Smith, Rondell A, MD, Stopped at 10/09/16 1322 .  acetaminophen (TYLENOL) tablet 650 mg, 650 mg, Oral, Q4H PRN **OR** acetaminophen (TYLENOL) solution 650 mg, 650 mg, Per Tube, Q4H PRN **OR** acetaminophen (TYLENOL) suppository 650 mg, 650 mg, Rectal, Q4H PRN, Smith, Rondell A, MD .  ALPRAZolam Duanne Moron) tablet 1 mg, 1 mg, Oral, BID PRN, Fuller Plan A, MD, 1 mg at 10/11/16 0241 .  aspirin suppository 300 mg, 300 mg, Rectal, Daily **OR** aspirin tablet 325 mg, 325 mg, Oral, Daily, Smith, Rondell A, MD, 325 mg at 10/11/16 1018 .  atorvastatin (LIPITOR) tablet 10 mg, 10 mg, Oral, q1800, Rosalin Hawking, MD, 10 mg at 10/10/16 1659 .  clopidogrel (PLAVIX) tablet 75 mg, 75 mg, Oral, Daily, Patteson, Samuel A, NP, 75 mg at 10/11/16 1019 .  enoxaparin (LOVENOX) injection 40 mg, 40 mg, Subcutaneous, Daily, Monia Sabal, PA-C, 40 mg at 10/11/16 1018 .  insulin aspart (novoLOG) injection 0-5 Units, 0-5 Units, Subcutaneous, QHS, Smith, Rondell A, MD .  insulin aspart (novoLOG) injection 0-9 Units, 0-9 Units, Subcutaneous, TID WC, Fuller Plan A, MD, 1 Units at 10/11/16 [id number] .  nystatin (MYCOSTATIN/NYSTOP) topical powder, , Topical, TID, Schorr, Rhetta Mura, NP .  oxyCODONE (Oxy IR/ROXICODONE) immediate release tablet 15 mg, 15 mg, Oral, Q4H PRN, Tamala Julian, Rondell A, MD, 15 mg at 10/11/16 1317 .  oxyCODONE (OXYCONTIN) 12 hr tablet 10 mg, 10 mg, Oral, Q12H, Smith, Rondell A, MD, 10 mg at 10/11/16 1019 .  senna-docusate (Senokot-S) tablet 1 tablet, 1 tablet, Oral, QHS PRN, Fuller Plan A, MD, 1 tablet at 10/10/16 1709  Patients Current Diet: Diet Carb Modified Fluid consistency: Thin; Room service appropriate? Yes  Precautions / Restrictions Precautions Precautions: Fall Restrictions Weight Bearing Restrictions: No   Has the patient had 2 or more falls or a fall with injury in the past year?Yes  Prior Activity Level Household: Homebound most of time.  Has no car, no  transportation.  Home Assistive Devices / Equipment Home Equipment: None  Prior Device Use: Indicate devices/aids used by the patient prior to current illness, exacerbation or injury? None  Prior Functional Level Prior Function Level of Independence: Independent Comments: patient cares for grandchildren, states that she rides horses and is very independent. Patient questionable historian at times  Self Care: Did the patient need help bathing, dressing, using the toilet or eating?  Independent  Indoor Mobility: Did the patient need assistance with walking from room to room (with or without device)? Independent  Stairs: Did the patient need assistance with internal or external stairs (with or without device)? Independent  Functional Cognition: Did the patient need help planning regular tasks such as shopping or remembering to take medications? Independent  Current Functional Level Cognition  Overall Cognitive Status: Impaired/Different from baseline Current Attention Level: Sustained Orientation Level: Oriented X4 Following Commands: Follows one step commands inconsistently, Follows one step commands with increased time Safety/Judgement: Decreased awareness of safety, Decreased awareness of deficits General Comments: easily distracted; pt required frequent vc for redirecting to task at hand and following through with tasks; pt a little impulsive at times     Extremity Assessment (includes Sensation/Coordination)  Upper Extremity Assessment: Defer to OT evaluation  Lower Extremity Assessment: Difficult to assess due to impaired cognition, RLE deficits/detail RLE Deficits / Details: noted decreased functional strength and coordination. Patient tangential and difficulty sequencing strength assessment RLE Coordination: decreased fine motor, decreased gross motor    ADLs       Mobility  Overal bed mobility: Needs Assistance Bed Mobility: Rolling, Sidelying to  Sit Rolling: Min guard Sidelying to sit: Min assist Supine to sit: Mod assist General bed mobility comments: cues for sequencing; assist to elevate trunk into sitting    Transfers  Overall transfer level: Needs assistance Equipment used: Rolling walker (2 wheeled) Transfers: Sit to/from Stand Sit to Stand: Min assist General transfer comment: cues for safety; pt began sitting prematurely and required assist to safely sit onto chair     Ambulation / Gait / Stairs / Wheelchair Mobility  Ambulation/Gait Ambulation/Gait assistance: Museum/gallery curator (Feet): 70 Feet Assistive device: Rolling walker (2 wheeled) Gait Pattern/deviations: Step-to pattern, Decreased stride length, Trunk flexed, Wide base of support, Step-through pattern, Decreased step length - right, Decreased dorsiflexion - right, Shuffle General Gait Details: mod cues for posture, increased bilat step length, and safe use of AD; pt with step through pattern initially but when fatigued began using step to pattern; standing rest break required; assist to steady and manage RW; pt required extra time and assist to turn around  Gait velocity: decreased    Posture / Balance Dynamic Sitting Balance Sitting balance - Comments: able to sit EOb and on toilet with min guard/ supervision Balance Overall balance assessment: Needs assistance Sitting-balance support: Feet supported, Bilateral upper extremity supported Sitting balance-Leahy Scale: Fair Sitting balance - Comments: able to sit EOb and on toilet with min guard/ supervision Standing balance support: Bilateral upper extremity supported Standing balance-Leahy Scale: Poor Standing balance comment: reliance on UE support High level balance activites: Direction changes, Turns High Level Balance Comments: min to moderate assist for in room turns. poor awareness of space    Special needs/care consideration BiPAP/CPAP No CPM No Continuous Drip IV No Dialysis No       Life Vest No Oxygen No Special Bed No Trach Size No Wound Vac (area) no     Skin Has "yeast rash" in groin and perineal area and on thighs/hips.  Has a wound on little finger of left hand with scabbed area.  Bowel mgmt: Last BM 10/11/16 Bladder mgmt: Up to bathroom with help Diabetic mgmt Yes, on oral medication at home    Previous Home Environment Living Arrangements: Spouse/significant other, Other relatives Available Help at Discharge: Family Type of Home: Mobile home Home Layout: One level Home Access: Stairs to enter Entrance Stairs-Rails: Can reach both Entrance Stairs-Number of Steps: 4 Bathroom Shower/Tub: Chiropodist: Standard  Discharge Living Setting Plans for Discharge Living Setting: Lives with (comment), Mobile Home (Lives with boyfriend of 16 years.) Type of Home at Discharge: Mobile home (single wide mobile home.) Discharge Home Layout: One level Discharge Home Access: Stairs to enter Entrance Stairs-Number of Steps: 4 steps Does the patient have any problems obtaining your medications?: No  Social/Family/Support Systems Patient Roles: Parent, Other (Comment) (Has a boyfriend, 2 sons, 3 daughters.)  One son is in prison and one daughter likes to "be on the streets". Contact Information: Raiford Noble - brother - [phone] Anticipated Caregiver: self and BF, Juan Ability/Limitations of Caregiver: Elita Quick works FT in tobacco and in the fields. Caregiver Availability: Intermittent Discharge Plan Discussed with Primary Caregiver: Yes Is Caregiver In Agreement with Plan?: Yes Does Caregiver/Family have Issues with Lodging/Transportation while Pt is in Rehab?: No  Goals/Additional Needs Patient/Family Goal for Rehab: PT mod I, OT mod I and supervision, SLP mod I and supervision goals Expected length of stay: 8-12 days Cultural Considerations: Holiness Dietary Needs: Carb mod, med cal, thin  liquids Equipment Needs: TBD Additional Information: Does not read of write above 3rd grade level.  She enjoys gardening and takes care of her grandchildren as needed. Pt/Family Agrees to Admission and willing to participate: Yes Program Orientation Provided & Reviewed with Pt/Caregiver Including Roles  & Responsibilities: Yes  Decrease burden of Care through IP rehab admission: N/A  Possible need for SNF placement upon discharge: Not planned  Patient Condition: This patient's condition remains as documented in the consult dated 10/11/16, in which the Rehabilitation Physician determined and documented that the patient's condition is appropriate for intensive rehabilitative care in an inpatient rehabilitation facility. Will admit to inpatient rehab today.  Preadmission Screen Completed By:  Retta Diones, 10/11/2016 2:51 PM ______________________________________________________________________   Discussed status with Dr. Naaman Plummer on 10/11/16 at 1451 and received telephone approval for admission today.  Admission Coordinator:  Retta Diones, time 1451/Date 10/11/16       Cosigned by: [name], MD at 10/11/2016 3:12 PM  Revision History

## 2016-10-11 NOTE — Progress Notes (Signed)
Report called in to inpatient rehab Barnett Applebaum RN). Patient & family updated.

## 2016-10-11 NOTE — Progress Notes (Signed)
Natalie Staggers, MD Physician Signed Physical Medicine and Rehabilitation  Consult Note Date of Service: 10/11/2016 6:03 AM  Related encounter: ED to Hosp-Admission (Current) from 10/08/2016 in [place] All Collapse All   [] Hide copied text [] Hover for attribution information      Physical Medicine and Rehabilitation Consult Reason for Consult: Right side weakness Referring Physician: [facility]   HPI: [name] is a 56 y.o. right handed female with history of hypertension, diabetes mellitus, breast cancer in remission, chronic back pain, tobacco abuse. Presented 10/09/2016 with right side weakness, slurred speech as well as recent fall and had been essentially bedbound for the past week or so. Per chart review patient lives with boyfriend. One level home for steps to entry. Independent prior to admission. Cranial CT scan showed no evidence of acute infarction. Blood pressure 88/40. Urine drug screen positive for opiates and benzos. MRI of the brain showed numerous acute small frontoparietal infarcts including the small vessel and MCA territories. MRI cervical spine without neurocompression. CT angiogram of head and neck showed no emergent large vessel occlusion. 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 Lower extremity Dopplers negative. Patient did not receive TPA. Cerebral angiogram 10/10/2016 per interventional radiology showed approximately 70% stenosis right MCA proximal M1 segment. Approximately 50% stenosis of right VA origin. Echocardiogram with ejection fraction 65% no wall motion abnormalities. Presently on aspirin for CVA prophylaxis. Subcutaneous Lovenox for DVT prophylaxis. Physical and occupational therapy evaluations completed with recommendations of physical medicine rehabilitation consult.   Review of Systems  Constitutional: Negative for chills and  fever.  HENT: Negative for hearing loss.   Eyes: Negative for blurred vision and double vision.  Respiratory: Positive for shortness of breath.   Cardiovascular: Positive for leg swelling. Negative for chest pain and palpitations.  Gastrointestinal: Positive for constipation. Negative for nausea.  Genitourinary: Negative for dysuria, flank pain and hematuria.  Musculoskeletal: Positive for back pain.  Skin: Negative for rash.  Neurological: Positive for speech change and weakness. Negative for seizures.  Psychiatric/Behavioral:       Anxiety  All other systems reviewed and are negative.      Past Medical History:  Diagnosis Date  . Asthma   . Back muscle spasm 12/13/2010  . Cancer [facility])    breast cancer, remission 2010  . Chronic back pain   . Chronic foot pain   . COPD (chronic obstructive pulmonary disease) ([place]) 09/14/2010  . H/O MRSA infection 09/14/2010  . High blood cholesterol   . Hyperlipidemia 12/07/2012  . Hypertension   . 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 Psoriasis 12/13/2010  . Tobacco abuse 12/07/2012        Past Surgical History:  Procedure Laterality Date  . ABDOMINAL HYSTERECTOMY    . CESAREAN SECTION     x7  . MASTECTOMY     left  . MASTECTOMY    . PORT-A-CATH REMOVAL  06/01/2011   Procedure: REMOVAL PORT-A-CATH;  Surgeon: Donato Heinz, MD;  Location: [facility];  Service: General;  Laterality: N/A;  Minor Room  . portacath insertion          Family History  Problem Relation Age of Onset  . Stroke Mother   . Stroke Father   . Cancer Maternal Aunt   . Cancer Maternal Grandmother   . Cancer Maternal Aunt   . Cancer Maternal Aunt    Social  History:  reports that she has been smoking.  She has a 9.00 pack-year smoking history. She has never used smokeless tobacco. She reports that she uses drugs, including Marijuana. She reports that she does not drink alcohol. Allergies:       Allergies  Allergen Reactions  .  Codeine Anaphylaxis  . Penicillins Anaphylaxis and Other (See Comments)    Has patient had a PCN reaction causing immediate rash, facial/tongue/throat swelling, SOB or lightheadedness with hypotension: Yes Has patient had a PCN reaction causing severe rash involving mucus membranes or skin necrosis: No Has patient had a PCN reaction that required hospitalization: Yes Has patient had a PCN reaction occurring within the last 10 years: No If all of the above answers are "NO", then may proceed with Cephalosporin use.          Medications Prior to Admission  Medication Sig Dispense Refill  . ALPRAZolam (XANAX) 1 MG tablet Take 1 tablet (1 mg total) by mouth 2 (two) times daily as needed. for anxiety (Patient taking differently: Take 1 mg by mouth 2 (two) times daily as needed for anxiety. ) 60 tablet 5  . amLODipine (NORVASC) 5 MG tablet Take 5 mg by mouth daily.    Marland Kitchen aspirin 81 MG chewable tablet Chew 81 mg by mouth daily.    . diclofenac sodium (VOLTAREN) 1 % GEL Apply 2-4 g topically 4 (four) times daily.    . Ipratropium-Albuterol (COMBIVENT RESPIMAT) 20-100 MCG/ACT AERS respimat Inhale 1-2 puffs into the lungs every 4 (four) hours as needed for wheezing or shortness of breath.    Marland Kitchen ipratropium-albuterol (DUONEB) 0.5-2.5 (3) MG/3ML SOLN Take 3 mLs by nebulization 2 (two) times daily.    Marland Kitchen losartan-hydrochlorothiazide (HYZAAR) 100-12.5 MG tablet Take 1 tablet by mouth daily.    . metFORMIN (GLUCOPHAGE) 500 MG tablet Take 500 mg by mouth 2 (two) times daily with a meal.    . metoprolol succinate (TOPROL-XL) 50 MG 24 hr tablet Take 50 mg by mouth daily. Take with or immediately following a meal.    . Olopatadine HCl (PATADAY) 0.2 % SOLN Place 1 drop into both eyes daily.    Marland Kitchen oxyCODONE (ROXICODONE) 15 MG immediate release tablet Take 15 mg by mouth every 4 (four) hours as needed for pain.  0  . OxyCODONE ER (XTAMPZA ER) 9 MG C12A Take 9 mg by mouth every 12 (twelve) hours.     . terbinafine (LAMISIL) 250 MG tablet Take 250 mg by mouth daily.    Marland Kitchen zolpidem (AMBIEN) 10 MG tablet Take 10 mg by mouth at bedtime.    . prochlorperazine (COMPAZINE) 10 MG tablet Take 10 mg by mouth every 6 (six) hours as needed for nausea or vomiting.      Home: Home Living Family/patient expects to be discharged to:: Private residence Living Arrangements: Spouse/significant other, Other relatives Available Help at Discharge: Family Type of Home: Mobile home Home Access: Stairs to enter Entrance Stairs-Number of Steps: 4 Entrance Stairs-Rails: Can reach both [place]: One level Bathroom Shower/Tub: Chiropodist: Standard Home Equipment: None  Functional History: Prior Function Level of Independence: Independent Comments: patient cares for grandchildren, states that she rides horses and is very independent. Patient questionable historian at times Functional Status:  Mobility: Bed Mobility Overal bed mobility: Needs Assistance Bed Mobility: Rolling, Supine to Sit Rolling: Min assist Supine to sit: Mod assist General bed mobility comments: moderate assist to elevate trunk and rotate to EOB. multi modal cues for task  performance Transfers Overall transfer level: Needs assistance Equipment used: Rolling walker (2 wheeled) Transfers: Sit to/from Stand Sit to Stand: Min assist General transfer comment: Max cues for safety and technique to power up and sit. Patient with decrease awareness of environment, attempting to sit prematurely on several ocassions. Ambulation/Gait Ambulation/Gait assistance: Min assist Ambulation Distance (Feet): 16 Feet Assistive device: Rolling walker (2 wheeled) Gait Pattern/deviations: Step-to pattern, Decreased stride length, Shuffle, Trunk flexed, Wide base of support General Gait Details: patient with poor ability to maintain position in RW, hands on physical assist required. Min assist for stability. max cues for  positioning  ADL:  Cognition: Cognition Overall Cognitive Status: No family/caregiver present to determine baseline cognitive functioning Orientation Level: Oriented X4 Cognition Arousal/Alertness: Awake/alert Behavior During Therapy: WFL for tasks assessed/performed Overall Cognitive Status: No family/caregiver present to determine baseline cognitive functioning Area of Impairment: Attention, Following commands, Safety/judgement, Awareness, Problem solving Current Attention Level: Sustained Following Commands: Follows one step commands inconsistently, Follows one step commands with increased time Safety/Judgement: Decreased awareness of safety, Decreased awareness of deficits Awareness: Intellectual Problem Solving: Slow processing, Difficulty sequencing, Requires verbal cues, Requires tactile cues, Decreased initiation General Comments: patient with poor insight and awareness, unable to process basic tasks to completion.   Blood pressure (!) 153/72, pulse 68, temperature 98.3 F (36.8 C), temperature source Oral, resp. rate 20, height 5\' 3"  (1.6 m), weight 90.7 kg (200 lb), SpO2 96 %. Physical Exam  HENT:  Head: Normocephalic.  Eyes: EOM are normal.  Neck: Normal range of motion. Neck supple. No thyromegaly present.  Cardiovascular: Normal rate, regular rhythm and normal heart sounds.   Respiratory: Effort normal and breath sounds normal. No respiratory distress.  GI: Soft. Bowel sounds are normal. She exhibits no distension.  Neurological: She is alert.  Makes good eye contact with examiner. She can provide her name but was uncertain of her date of birth. Limited medical historian. She was able to follow simple commands. RUE 3 to 3+/5  RLE 4/5 prox to distal. LUE and LLE 4+ to 5/5. ?decrease of LT RUE and RLE. Speech slightly dysarthric. Language normal    Lab Results Last 24 Hours       Results for orders placed or performed during the hospital encounter of 10/08/16 (from  the past 24 hour(s))  Rapid urine drug screen (hospital performed)     Status: Abnormal   Collection Time: 10/10/16  6:46 AM  Result Value Ref Range   Opiates POSITIVE (A) NONE DETECTED   Cocaine NONE DETECTED NONE DETECTED   Benzodiazepines POSITIVE (A) NONE DETECTED   Amphetamines NONE DETECTED NONE DETECTED   Tetrahydrocannabinol NONE DETECTED NONE DETECTED   Barbiturates NONE DETECTED NONE DETECTED  CBC     Status: None   Collection Time: 10/10/16  6:51 AM  Result Value Ref Range   WBC 9.1 4.0 - 10.5 K/uL   RBC 4.33 3.87 - 5.11 MIL/uL   Hemoglobin 13.0 12.0 - 15.0 g/dL   HCT 39.9 36.0 - 46.0 %   MCV 92.1 78.0 - 100.0 fL   MCH 30.0 26.0 - 34.0 pg   MCHC 32.6 30.0 - 36.0 g/dL   RDW 14.6 11.5 - 15.5 %   Platelets 262 150 - 400 K/uL  Glucose, capillary     Status: Abnormal   Collection Time: 10/10/16  6:54 AM  Result Value Ref Range   Glucose-Capillary 100 (H) 65 - 99 mg/dL   Comment 1 Notify RN    Comment 2 Document  in Chart   I-STAT, chem 8     Status: Abnormal   Collection Time: 10/10/16  9:45 AM  Result Value Ref Range   Sodium 140 135 - 145 mmol/L   Potassium 2.9 (L) 3.5 - 5.1 mmol/L   Chloride 99 (L) 101 - 111 mmol/L   BUN 17 6 - 20 mg/dL   Creatinine, Ser 0.70 0.44 - 1.00 mg/dL   Glucose, Bld 102 (H) 65 - 99 mg/dL   Calcium, Ion 1.25 1.15 - 1.40 mmol/L   TCO2 30 0 - 100 mmol/L   Hemoglobin 11.9 (L) 12.0 - 15.0 g/dL   HCT 35.0 (L) 36.0 - 46.0 %  Glucose, capillary     Status: Abnormal   Collection Time: 10/10/16 12:00 PM  Result Value Ref Range   Glucose-Capillary 109 (H) 65 - 99 mg/dL  Basic metabolic panel     Status: Abnormal   Collection Time: 10/10/16  1:49 PM  Result Value Ref Range   Sodium 137 135 - 145 mmol/L   Potassium 3.0 (L) 3.5 - 5.1 mmol/L   Chloride 101 101 - 111 mmol/L   CO2 30 22 - 32 mmol/L   Glucose, Bld 179 (H) 65 - 99 mg/dL   BUN 13 6 - 20 mg/dL   Creatinine, Ser 0.72 0.44 - 1.00 mg/dL    Calcium 8.6 (L) 8.9 - 10.3 mg/dL   GFR calc non Af Amer >60 >60 mL/min   GFR calc Af Amer >60 >60 mL/min   Anion gap 6 5 - 15  Protime-INR     Status: Abnormal   Collection Time: 10/10/16  1:49 PM  Result Value Ref Range   Prothrombin Time 15.3 (H) 11.4 - 15.2 seconds   INR 1.20   APTT     Status: None   Collection Time: 10/10/16  1:49 PM  Result Value Ref Range   aPTT 32 24 - 36 seconds  Glucose, capillary     Status: Abnormal   Collection Time: 10/10/16  4:52 PM  Result Value Ref Range   Glucose-Capillary 126 (H) 65 - 99 mg/dL   Comment 1 Notify RN    Comment 2 Document in Chart   Glucose, capillary     Status: Abnormal   Collection Time: 10/10/16  9:02 PM  Result Value Ref Range   Glucose-Capillary 135 (H) 65 - 99 mg/dL   Comment 1 Notify RN    Comment 2 Document in Chart       Imaging Results (Last 48 hours)  Ct Angio Head W Or Wo Contrast  Addendum Date: 10/09/2016   ADDENDUM REPORT: 10/09/2016 23:04 ADDENDUM: Critical Value/emergent results were called by telephone at the time of interpretation on 10/09/2016 at 11:04 pm to Dr. Cheral Marker, Neurology, who verbally acknowledged these results. Electronically Signed   By: Elon Alas M.D.   On: 10/09/2016 23:04   Result Date: 10/09/2016 CLINICAL DATA:  Follow-up stroke. History of hypertension, hyperlipidemia and breast cancer. EXAM: CT ANGIOGRAPHY HEAD AND NECK TECHNIQUE: Multidetector CT imaging of the head and neck was performed using the standard protocol during bolus administration of intravenous contrast. Multiplanar CT image reconstructions and MIPs were obtained to evaluate the vascular anatomy. Carotid stenosis measurements (when applicable) are obtained utilizing NASCET criteria, using the distal internal carotid diameter as the denominator. CONTRAST:  50 cc Isovue 370 COMPARISON:  MRI of the head October 08, 2016 FINDINGS: CT HEAD FINDINGS BRAIN: No intraparenchymal hemorrhage, mass effect or  midline shift. Focal mesial RIGHT occipital lobe  encephalomalacia. Patchy RIGHT parietal hypodensities. Patchy white matter hypodensities corresponding to acute infarcts. Mild prominence of the ventricles and sulci for patient's age. No abnormal extra-axial fluid collections. VASCULAR: Mild calcific atherosclerosis carotid siphon found vertebral artery's. SKULL/SOFT TISSUES: No skull fracture. No significant soft tissue swelling. ORBITS/SINUSES: The included ocular globes and orbital contents are normal.Minimal RIGHT sphenoid mucosal thickening. No paranasal sinus air-fluid levels. Mastoid air cells are well aerated. OTHER: Patient is edentulous. CTA NECK AORTIC ARCH: Mild intimal thickening calcific atherosclerosis. 2.9 cm segment LEFT subclavian artery origin occlusion with expanded appearance, reconstitution at medially proximal to LEFT vertebral artery takeoff which is patent. Innominate and LEFT Common carotid artery origins are widely patent. Mild stenosis RIGHT proximal subclavian artery. RIGHT CAROTID SYSTEM: Common carotid artery is widely patent. Normal appearance of the carotid bifurcation without hemodynamically significant stenosis by NASCET criteria, mild intimal thickening. Normal appearance of the included internal carotid artery. LEFT CAROTID SYSTEM: Common carotid artery is widely patent. Normal appearance of the carotid bifurcation without hemodynamically significant stenosis by NASCET criteria, mild calcific atherosclerosis. Normal appearance of the included internal carotid artery. VERTEBRAL ARTERIES:Codominant vertebral artery's. Moderate stenosis RIGHT vertebral artery origin. Bilateral vertebral artery's are patent throughout the course. SKELETON: No acute osseous process though bone windows have not been submitted. OTHER NECK: Soft tissues of the neck are non-acute though, not tailored for evaluation. Centrilobular emphysema included lung apices. CTA HEAD ANTERIOR CIRCULATION: Patent  cervical internal carotid arteries, petrous, cavernous and supra clinoid internal carotid arteries. 3 mm posteriorly directed LEFT posterior communicating origin at ICA. Calcific atherosclerosis resulting in 2 mm segment severe stenosis LEFT para clinoid internal carotid artery, moderate stenosis RIGHT paraclinoid internal carotid artery. Thready bilateral A1 segments, patent anterior communicating artery. Severe stenosis LEFT A2 segment. Moderate luminal irregularity bilateral middle cerebral artery's including moderate stenosis proximal RIGHT M1 segment. Severe focal stenosis LEFT M3 segment. No large vessel occlusion, contrast extravasation or aneurysm. POSTERIOR CIRCULATION: Patent vertebral arteries, vertebrobasilar junction and basilar artery, as well as main branch vessels. Mildly stenotic LEFT vertebral artery due to the calcific atherosclerosis. Patent posterior cerebral arteries, moderate luminal irregularity. No large vessel occlusion, contrast extravasation. VENOUS SINUSES: Major dural venous sinuses are patent though not tailored for evaluation on this angiographic examination. ANATOMIC VARIANTS: None. DELAYED PHASE: No abnormal intracranial enhancement MIP images reviewed. IMPRESSION: CT HEAD: 1. Evolving small acute watershed and RIGHT MCA territory infarcts without hemorrhagic conversion. 2. Old small RIGHT frontal/ACA territory infarct. 3. Mild parenchymal brain volume loss for age. CTA NECK: 1. 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 Reconstitution proximal to LEFT vertebral artery concerning for steal. 2. Atherosclerosis without hemodynamically significant stenosis carotid artery's. 3. Moderate stenosis RIGHT vertebral artery origin. CTA HEAD: 1. No emergent large vessel occlusion. 2. Severe stenoses LEFT paraclinoid ICA, LEFT anterior cerebral artery and LEFT middle cerebral artery. 3. Moderate general cerebral artery atherosclerosis and stenoses. Electronically Signed: By:  Elon Alas M.D. On: 10/09/2016 22:47   Ct Angio Neck W Or Wo Contrast  Addendum Date: 10/09/2016   ADDENDUM REPORT: 10/09/2016 23:04 ADDENDUM: Critical Value/emergent results were called by telephone at the time of interpretation on 10/09/2016 at 11:04 pm to Dr. Cheral Marker, Neurology, who verbally acknowledged these results. Electronically Signed   By: Elon Alas M.D.   On: 10/09/2016 23:04   Result Date: 10/09/2016 CLINICAL DATA:  Follow-up stroke. History of hypertension, hyperlipidemia and breast cancer. EXAM: CT ANGIOGRAPHY HEAD AND NECK TECHNIQUE: Multidetector CT imaging of the head and neck was performed using  the standard protocol during bolus administration of intravenous contrast. Multiplanar CT image reconstructions and MIPs were obtained to evaluate the vascular anatomy. Carotid stenosis measurements (when applicable) are obtained utilizing NASCET criteria, using the distal internal carotid diameter as the denominator. CONTRAST:  50 cc Isovue 370 COMPARISON:  MRI of the head October 08, 2016 FINDINGS: CT HEAD FINDINGS BRAIN: No intraparenchymal hemorrhage, mass effect or midline shift. Focal mesial RIGHT occipital lobe encephalomalacia. Patchy RIGHT parietal hypodensities. Patchy white matter hypodensities corresponding to acute infarcts. Mild prominence of the ventricles and sulci for patient's age. No abnormal extra-axial fluid collections. VASCULAR: Mild calcific atherosclerosis carotid siphon found vertebral artery's. SKULL/SOFT TISSUES: No skull fracture. No significant soft tissue swelling. ORBITS/SINUSES: The included ocular globes and orbital contents are normal.Minimal RIGHT sphenoid mucosal thickening. No paranasal sinus air-fluid levels. Mastoid air cells are well aerated. OTHER: Patient is edentulous. CTA NECK AORTIC ARCH: Mild intimal thickening calcific atherosclerosis. 2.9 cm segment LEFT subclavian artery origin occlusion with expanded appearance, reconstitution at  medially proximal to LEFT vertebral artery takeoff which is patent. Innominate and LEFT Common carotid artery origins are widely patent. Mild stenosis RIGHT proximal subclavian artery. RIGHT CAROTID SYSTEM: Common carotid artery is widely patent. Normal appearance of the carotid bifurcation without hemodynamically significant stenosis by NASCET criteria, mild intimal thickening. Normal appearance of the included internal carotid artery. LEFT CAROTID SYSTEM: Common carotid artery is widely patent. Normal appearance of the carotid bifurcation without hemodynamically significant stenosis by NASCET criteria, mild calcific atherosclerosis. Normal appearance of the included internal carotid artery. VERTEBRAL ARTERIES:Codominant vertebral artery's. Moderate stenosis RIGHT vertebral artery origin. Bilateral vertebral artery's are patent throughout the course. SKELETON: No acute osseous process though bone windows have not been submitted. OTHER NECK: Soft tissues of the neck are non-acute though, not tailored for evaluation. Centrilobular emphysema included lung apices. CTA HEAD ANTERIOR CIRCULATION: Patent cervical internal carotid arteries, petrous, cavernous and supra clinoid internal carotid arteries. 3 mm posteriorly directed LEFT posterior communicating origin at ICA. Calcific atherosclerosis resulting in 2 mm segment severe stenosis LEFT para clinoid internal carotid artery, moderate stenosis RIGHT paraclinoid internal carotid artery. Thready bilateral A1 segments, patent anterior communicating artery. Severe stenosis LEFT A2 segment. Moderate luminal irregularity bilateral middle cerebral artery's including moderate stenosis proximal RIGHT M1 segment. Severe focal stenosis LEFT M3 segment. No large vessel occlusion, contrast extravasation or aneurysm. POSTERIOR CIRCULATION: Patent vertebral arteries, vertebrobasilar junction and basilar artery, as well as main branch vessels. Mildly stenotic LEFT vertebral artery due  to the calcific atherosclerosis. Patent posterior cerebral arteries, moderate luminal irregularity. No large vessel occlusion, contrast extravasation. VENOUS SINUSES: Major dural venous sinuses are patent though not tailored for evaluation on this angiographic examination. ANATOMIC VARIANTS: None. DELAYED PHASE: No abnormal intracranial enhancement MIP images reviewed. IMPRESSION: CT HEAD: 1. Evolving small acute watershed and RIGHT MCA territory infarcts without hemorrhagic conversion. 2. Old small RIGHT frontal/ACA territory infarct. 3. Mild parenchymal brain volume loss for age. CTA NECK: 1. 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 Reconstitution proximal to LEFT vertebral artery concerning for steal. 2. Atherosclerosis without hemodynamically significant stenosis carotid artery's. 3. Moderate stenosis RIGHT vertebral artery origin. CTA HEAD: 1. No emergent large vessel occlusion. 2. Severe stenoses LEFT paraclinoid ICA, LEFT anterior cerebral artery and LEFT middle cerebral artery. 3. Moderate general cerebral artery atherosclerosis and stenoses. Electronically Signed: By: Elon Alas M.D. 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 Does the need for close, 24 hr/day medical  supervision in concert with the patient's rehab needs make it unreasonable for this patient to be served in a less intensive setting? Yes 2. Co-Morbidities requiring supervision/potential complications: SIRS, hypokalemia, htn, dm2 3. Due to bladder management, bowel management, safety, skin/wound care, disease management, medication administration, pain management and patient education, does the patient require 24 hr/day rehab nursing? Yes 4. Does the patient require coordinated care of a physician, rehab nurse, PT (1-2 hrs/day, 5 days/week), OT (1-2 hrs/day, 5 days/week) and SLP (1-2 hrs/day, 5 days/week) to address physical and  functional deficits in the context of the above medical diagnosis(es)? Yes Addressing deficits in the following areas: balance, endurance, locomotion, strength, transferring, bowel/bladder control, bathing, dressing, feeding, grooming, toileting, cognition, speech and psychosocial support 5. Can the patient actively participate in an intensive therapy program of at least 3 hrs of therapy per day at least 5 days per week? Yes 6. The potential for patient to make measurable gains while on inpatient rehab is excellent 7. Anticipated functional outcomes upon discharge from inpatient rehab are modified independent  with PT, modified independent and supervision with OT, modified independent and supervision with SLP. 8. Estimated rehab length of stay to reach the above functional goals is: 8-12 days 9. Anticipated D/C setting: Home 10. Anticipated post D/C treatments: [place] therapy 11. Overall Rehab/Functional Prognosis: excellent  RECOMMENDATIONS: This patient's condition is appropriate for continued rehabilitative care in the following setting: CIR Patient has agreed to participate in recommended program. Yes Note that insurance prior authorization may be required for reimbursement for recommended care.  Comment: Rehab Admissions Coordinator to follow up.  Thanks,  Natalie Staggers, MD, [name]    Cathlyn Parsons., PA-C 10/11/2016    Revision History                        Routing History

## 2016-10-11 NOTE — Progress Notes (Signed)
10/11/16 0830  What Happened  Was fall witnessed? No  Was patient injured? No  Patient found on floor  Found by Staff-comment (NT)  Stated prior activity to/from bed, chair, or stretcher  Follow Up  MD notified yes  Time MD notified 0900  Family notified Yes-comment  Time family notified 0900  Additional tests No  Simple treatment Other (comment) (none needed)  Adult Fall Risk Assessment  Risk Factor Category (scoring not indicated) Fall has occurred during this admission (document High fall risk)  Age 56  Fall History: Fall within 6 months prior to admission 5  Elimination; Bowel and/or Urine Incontinence 0  Elimination; Bowel and/or Urine Urgency/Frequency 0  Medications: includes PCA/Opiates, Anti-convulsants, Anti-hypertensives, Diuretics, Hypnotics, Laxatives, Sedatives, and Psychotropics 3  Patient Care Equipment 2  Mobility-Assistance 2  Mobility-Gait 2  Mobility-Sensory Deficit 0  Altered awareness of immediate physical environment 0  Impulsiveness 2  Lack of understanding of one's physical/cognitive limitations 0  Total Score 16  Patient's Fall Risk High Fall Risk (>13 points)  Vitals  Temp 98.4 F (36.9 C)  Temp Source Oral  BP (!) 162/81  BP Location Right Arm  BP Method Automatic  Patient Position (if appropriate) Lying  Pulse Rate 80  Pulse Rate Source Dinamap  Resp 20  Oxygen Therapy  SpO2 98 %  O2 Device Room Air  Pain Assessment  Pain Assessment No/denies pain  Pain Score 0  PCA/Epidural/Spinal Assessment  Respiratory Pattern Regular  Neurological  Neuro (WDL) X  Level of Consciousness Alert  Orientation Level Oriented X4  Cognition Memory impairment  Speech Clear  Pupil Assessment  Yes  R Pupil Size (mm) 3  R Pupil Shape Round  R Pupil Reaction Brisk  L Pupil Size (mm) 3  L Pupil Shape Round  L Pupil Reaction Brisk  Additional Pupil Assessments No  Motor Function/Sensation Assessment Grip;Dorsiflexion;Plantar flexion;Sensation  Facial  Symmetry Symmetrical  R Hand Grip Weak  L Hand Grip Moderate   Right Pronator Drift Present  Left Pronator Drift Absent  R Foot Dorsiflexion Moderate  L Foot Dorsiflexion Strong  R Foot Plantar Flexion Moderate  L Foot Plantar Flexion Strong  R Finger to Nose (Point to [employer]) Smooth  L Finger to Nose (Point to [employer]) Smooth  R Heel to [employer] (Point to [employer]) Smooth  L Heel to [employer] to [employer]) Smooth  RUE Motor Response Purposeful movement  RUE Sensation Full sensation  RUE Motor Strength 3  LUE Motor Response Purposeful movement  LUE Sensation Full sensation  LUE Motor Strength 5  RLE Motor Response Purposeful movement  RLE Sensation Full sensation  RLE Motor Strength 3  LLE Motor Response Purposeful movement  LLE Sensation Full sensation  LLE Motor Strength 5  Neuro Symptoms Anxiety  Glasgow Coma Scale  Eye Opening 4  Best Verbal Response (NON-intubated) 5  Best Motor Response 6  Glasgow Coma Scale Score 15  NIH Stroke Scale ( + Modified Stroke Scale Criteria)   Interval Shift assessment  Level of Consciousness (1a.)    0  LOC Questions (1b. )   + 0  LOC Commands (1c. )   +  0  Best Gaze (2. )  + 0  Visual (3. )  + 0  Facial Palsy (4. )     0  Motor Arm, Left (5a. )   + 0  Motor Arm, Right (5b. )   + 2  Motor Leg, Left (6a. )   + 0  Motor Leg,  Right (6b. )   + 2  Limb Ataxia (7. ) 0  Sensory (8. )   + 0  Best Language (9. )   + 0  Dysarthria (10. ) 0  Extinction/Inattention (11.)   + 0  Total 4  Musculoskeletal  Musculoskeletal (WDL) X  Assistive Device Front wheel walker  Generalized Weakness Yes  Weight Bearing Restrictions No  Integumentary  Integumentary (WDL) X  Skin Color Appropriate for ethnicity  Skin Condition Dry  Skin Integrity Abrasion  Abrasion Location Finger (Comment which one)  Abrasion Location Orientation Left  Rash Location Groin  Rash Location Orientation Anterior  Rash Intervention Cleansed;Other (Comment)  Skin Turgor  Non-tenting  Pain Screening  Clinical Progression Not changed

## 2016-10-11 NOTE — Discharge Summary (Signed)
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 OTHER: Patient is edentulous. MRI CERVICAL SPINE FINDINGS - motion degraded examination. ALIGNMENT: Straightened cervical lordosis.  No malalignment. VERTEBRAE/DISCS: Vertebral bodies are intact. Intervertebral disc morphology's and signal are normal. Multilevel mild chronic discogenic endplate changes. No abnormal or acute bone marrow signal. CORD:Cervical spinal cord is normal morphology and signal characteristics from the cervicomedullary junction to level of T1-2, the most caudal well visualized level. POSTERIOR FOSSA, VERTEBRAL ARTERIES, PARASPINAL TISSUES: No MR findings of ligamentous injury. Vertebral artery flow voids present. Included posterior fossa and paraspinal soft tissues are normal. DISC LEVELS (moderately motion degraded axial sequences limit evaluation) C2-3: No disc bulge, canal stenosis nor neural foraminal narrowing. C3-4, C4-5: Uncovertebral hypertrophy and mild facet arthropathy. No canal stenosis or neural foraminal narrowing. C5-6,  C6-7: Small broad-based disc bulge. No canal stenosis or neural foraminal narrowing. C7-T1: No disc bulge, canal stenosis nor neural foraminal narrowing. IMPRESSION: MRI HEAD: 1. Numerous acute small frontoparietal infarcts including the small vessel and MCA territories. These may be embolic, seen with proximal stenosis or vasculopathy. Recommend CTA HEAD and neck. 2. Mild chronic small vessel ischemic disease. Borderline parenchymal brain volume loss for age. MRI CERVICAL SPINE: 1. Motion degraded examination. Early degenerative change of the cervical spine without neurocompression. Electronically Signed   By: Elon Alas M.D.   On: 10/09/2016 00:55   Mr Cervical Spine Wo Contrast  Result Date: 10/09/2016 CLINICAL DATA:  Altered mental status. RIGHT-sided hemiplegia. History of hypertension, hyperlipidemia and breast cancer. EXAM: MRI HEAD WITHOUT CONTRAST MRI CERVICAL SPINE WITHOUT CONTRAST TECHNIQUE: Multiplanar, multiecho pulse sequences of the brain and surrounding structures, and cervical spine, to include the craniocervical junction and cervicothoracic junction, were obtained without intravenous contrast. COMPARISON:  CT HEAD August 18th 2018 at 1725 hours and CT cervical spine October 05, 2012 FINDINGS: MRI HEAD FINDINGS- mildly motion degraded examination. BRAIN: Patchy reduced diffusion bilateral frontoparietal lobes, including deep white matter and, biparietal cortex. Low ADC values. No susceptibility artifact to suggest hemorrhage. The ventricles and sulci are mildly prominent for patient's age. Patchy T2 hyperintense signal exclusive of the aforementioned abnormalities in a nonspecific distribution. Old small RIGHT cerebellar infarct. No suspicious parenchymal signal, mass or mass effect. No abnormal extra-axial fluid collections. VASCULAR: Normal major intracranial vascular flow voids present at skull base. SKULL AND UPPER CERVICAL SPINE: No abnormal sellar expansion. No suspicious calvarial  bone marrow signal. Craniocervical junction maintained. SINUSES/ORBITS: The mastoid air-cells and included paranasal sinuses are well-aerated. The included ocular globes and orbital contents are non-suspicious. OTHER: Patient is edentulous. MRI CERVICAL SPINE FINDINGS - motion degraded examination. ALIGNMENT: Straightened cervical lordosis.  No malalignment. VERTEBRAE/DISCS: Vertebral bodies are intact. Intervertebral disc morphology's and signal are normal. Multilevel mild chronic discogenic endplate changes. No abnormal or acute bone marrow signal. CORD:Cervical spinal cord is normal morphology and signal characteristics from the cervicomedullary junction to level of T1-2, the most caudal well visualized level.  POSTERIOR FOSSA, VERTEBRAL ARTERIES, PARASPINAL TISSUES: No MR findings of ligamentous injury. Vertebral artery flow voids present. Included posterior fossa and paraspinal soft tissues are normal. DISC LEVELS (moderately motion degraded axial sequences limit evaluation) C2-3: No disc bulge, canal stenosis nor neural foraminal narrowing. C3-4, C4-5: Uncovertebral hypertrophy and mild facet arthropathy. No canal stenosis or neural foraminal narrowing. C5-6, C6-7: Small broad-based disc bulge. No canal stenosis or neural foraminal narrowing. C7-T1: No disc bulge, canal stenosis nor neural foraminal narrowing. IMPRESSION: MRI HEAD: 1. Numerous acute small frontoparietal infarcts including the small vessel and MCA territories. These may be embolic, seen with proximal stenosis or vasculopathy. Recommend CTA HEAD and neck. 2. Mild chronic small vessel ischemic disease. Borderline parenchymal brain volume loss for age. MRI CERVICAL SPINE: 1. Motion degraded examination. Early degenerative change of the cervical spine without neurocompression. Electronically Signed   By: Elon Alas M.D.   On: 10/09/2016 00:55   Ir Angiogram Extremity Left  Result Date: 10/11/2016 CLINICAL DATA:  Left-sided weakness with  confusion. Right hemispheric watershed ischemic strokes. Abnormal MRI of the brain. EXAM: BILATERAL COMMON CAROTID AND INNOMINATE ANGIOGRAPHY; LEFT EXTREMITY ARTERIOGRAPHY; IR ANGIO VERTEBRAL SEL SUBCLAVIAN INNOMINATE UNI RIGHT MOD SED COMPARISON:  MRI of the brain of 10/08/2016, and CT angiogram of the brain of 10/09/2016. MEDICATIONS: Heparin 1500 units IV; no antibiotic was administered within 1 hour of the procedure. ANESTHESIA/SEDATION: Versed 0.5 mg IV; Fentanyl 50 mcg IV. Moderate Sedation Time:  28 minutes. The patient was continuously monitored during the procedure by the interventional radiology nurse under my direct supervision. CONTRAST:  Isovue 300 approximately 55 mL. FLUOROSCOPY TIME:  Fluoroscopy Time: 9 minutes 54 seconds (971 mGy). COMPLICATIONS: None immediate. TECHNIQUE: Informed written consent was obtained from the patient after a thorough discussion of the procedural risks, benefits and alternatives. All questions were addressed. Maximal Sterile Barrier Technique was utilized including caps, mask, sterile gowns, sterile gloves, sterile drape, hand hygiene and skin antiseptic. A timeout was performed prior to the initiation of the procedure. The right groin was prepped and draped in the usual sterile fashion. Thereafter using modified Seldinger technique, transfemoral access into the right common femoral artery was obtained without difficulty. Over a 0.035 inch guidewire, a 5 French Pinnacle sheath was inserted. Through this, and also over 0.035 inch guidewire, a 5 [place] JB 1 catheter was advanced to the aortic arch region and selectively positioned in the right common carotid artery, , the right subclavian artery, the left common carotid artery and the left subclavian artery. FINDINGS: The right common carotid arteriogram demonstrates the right external carotid artery and its major branches to widely patent. The right internal carotid artery has a smooth shallow plaque along its medial wall  without associated ulcerations or stenosis by the NASCET criteria. Distal to this the vessel is seen to opacify normally to the cranial skull base. The petrous, cavernous and supraclinoid segments are widely patent. Transient opacification via the right posterior communicating artery of the right posterior cerebral artery distribution is seen. The right middle cerebral artery and the proximal M1 segment demonstrates 70% focal stenosis due to a smooth circumferential plaque. Distal to this there is mild post stenotic dilatation. The right middle cerebral artery trifurcation branches are seen to opacify normally into the capillary and venous phases. There is approximately 50-70% stenoses of the anterior cerebral artery proximal A1 segment. Distal to this flow is noted into the right anterior cerebral artery into the pericallosal and the callosal marginal branches. In flow of non-opacified blood  into the right anterior cerebral artery A2 segment is seen via the anterior communicating artery from the left internal carotid artery. The right vertebral artery origin demonstrates a 50% stenosis at its origin. Distal to this the vessel is seen to opacify normally to the cranial skull base. Normal opacification is seen of the right vertebrobasilar junction and the right posterior-inferior cerebellar artery. The basilar artery demonstrates approximately 30% stenosis in its mid basilar artery distal to the anterior-inferior cerebellar arteries, and of a 50% proximal to the termination into the posterior cerebral arteries. The posterior cerebral arteries, superior cerebellar arteries and the anterior-inferior cerebellar arteries are seen to opacify into the capillary and venous phases. The left common carotid arteriogram demonstrates the left external carotid artery and its major branches to be widely patent. There is a smooth shallow plaque along the anterior wall of the left common carotid artery just proximal to its  bifurcation. The left internal carotid artery at the bulb is widely patent. There is mild smooth caliber irregularity of the left internal carotid artery just distal to the bulb which probably represents mild FMD-like changes. Distal to this the left internal carotid artery is seen to opacify to the cranial skull base. The petrous and the proximal cavernous segments are widely patent. There is approximately 65% stenosis of the left internal carotid artery, proximal supraclinoid segment. The ophthalmic artery is widely patent. The left middle cerebral artery demonstrates mild narrowing in its M1 segment. The left MCA trifurcation branches are widely patent into the capillary and venous phases. The left anterior cerebral artery A1 segment demonstrates approximately 50-70% stenoses. Distal to this the left anterior cerebral A2 segment demonstrates normal opacification into the capillary and venous phases. There is prompt opacification via the anterior communicating artery of the right anterior cerebral artery A2 segment and distally. Also demonstrated is a conical shaped outpouching in the region of the left posterior communicating artery most compatible with an infundibulum. The left subclavian arteriogram demonstrates a severe high-grade focal stenosis distal to its origin associated with filling defects and slow opacification of the left subclavian artery distally. 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 Approximately 50% stenosis of the right vertebral artery at its origin. Approximately 65% stenosis of the left internal carotid artery supraclinoid segment. Approximately 70% stenosis of the right middle cerebral artery M1 segment proximally. Approximately 50-70% stenoses of the anterior  cerebral arteries A1 segments bilaterally. PLAN: Angiographic findings were reviewed with the patient. Electronically Signed   By: Luanne Bras M.D.   On: 10/10/2016 18:24   Dg Hand Complete Right  Result Date: 10/08/2016 CLINICAL DATA:  Hand pain following fall, initial encounter EXAM: RIGHT HAND - COMPLETE 3+ VIEW COMPARISON:  None. FINDINGS: There is no evidence of fracture or dislocation. There is no evidence of arthropathy or other focal bone abnormality. Soft tissues are unremarkable. IMPRESSION: No acute abnormality noted. Electronically Signed   By: Inez Catalina M.D.   On: 10/08/2016 17:22   Ir Angio Intra Extracran Sel Com Carotid Innominate Bilat Mod Sed  Result Date: 10/11/2016 CLINICAL DATA:  Left-sided weakness with confusion. Right hemispheric watershed ischemic strokes. Abnormal MRI of the brain. EXAM: BILATERAL COMMON CAROTID AND INNOMINATE ANGIOGRAPHY; LEFT EXTREMITY ARTERIOGRAPHY; IR ANGIO VERTEBRAL SEL SUBCLAVIAN INNOMINATE UNI RIGHT MOD SED COMPARISON:  MRI of the brain  of 10/08/2016, and CT angiogram of the brain of 10/09/2016. MEDICATIONS: Heparin 1500 units IV; no antibiotic was administered within 1 hour of the procedure. ANESTHESIA/SEDATION: Versed 0.5 mg IV; Fentanyl 50 mcg IV. Moderate Sedation Time:  28 minutes. The patient was continuously monitored during the procedure by the interventional radiology nurse under my direct supervision. CONTRAST:  Isovue 300 approximately 55 mL. FLUOROSCOPY TIME:  Fluoroscopy Time: 9 minutes 54 seconds (971 mGy). COMPLICATIONS: None immediate. TECHNIQUE: Informed written consent was obtained from the patient after a thorough discussion of the procedural risks, benefits and alternatives. All questions were addressed. Maximal Sterile Barrier Technique was utilized including caps, mask, sterile gowns, sterile gloves, sterile drape, hand hygiene and skin antiseptic. A timeout was performed prior to the initiation of the procedure. The right  groin was prepped and draped in the usual sterile fashion. Thereafter using modified Seldinger technique, transfemoral access into the right common femoral artery was obtained without difficulty. Over a 0.035 inch guidewire, a 5 French Pinnacle sheath was inserted. Through this, and also over 0.035 inch guidewire, a 5 [place] JB 1 catheter was advanced to the aortic arch region and selectively positioned in the right common carotid artery, , the right subclavian artery, the left common carotid artery and the left subclavian artery. FINDINGS: The right common carotid arteriogram demonstrates the right external carotid artery and its major branches to widely patent. The right internal carotid artery has a smooth shallow plaque along its medial wall without associated ulcerations or stenosis by the NASCET criteria. Distal to this the vessel is seen to opacify normally to the cranial skull base. The petrous, cavernous and supraclinoid segments are widely patent. Transient opacification via the right posterior communicating artery of the right posterior cerebral artery distribution is seen. The right middle cerebral artery and the proximal M1 segment demonstrates 70% focal stenosis due to a smooth circumferential plaque. Distal to this there is mild post stenotic dilatation. The right middle cerebral artery trifurcation branches are seen to opacify normally into the capillary and venous phases. There is approximately 50-70% stenoses of the anterior cerebral artery proximal A1 segment. Distal to this flow is noted into the right anterior cerebral artery into the pericallosal and the callosal marginal branches. In flow of non-opacified blood into the right anterior cerebral artery A2 segment is seen via the anterior communicating artery from the left internal carotid artery. The right vertebral artery origin demonstrates a 50% stenosis at its origin. Distal to this the vessel is seen to opacify normally to the cranial skull  base. Normal opacification is seen of the right vertebrobasilar junction and the right posterior-inferior cerebellar artery. The basilar artery demonstrates approximately 30% stenosis in its mid basilar artery distal to the anterior-inferior cerebellar arteries, and of a 50% proximal to the termination into the posterior cerebral arteries. The posterior cerebral arteries, superior cerebellar arteries and the anterior-inferior cerebellar arteries are seen to opacify into the capillary and venous phases. The left common carotid arteriogram demonstrates the left external carotid artery and its major branches to be widely patent. There is a smooth shallow plaque along the anterior wall of the left common carotid artery just proximal to its bifurcation. The left internal carotid artery at the bulb is widely patent. There is mild smooth caliber irregularity of the left internal carotid artery just distal to the bulb which probably represents mild FMD-like changes. Distal to this the left internal carotid artery is seen to opacify to the cranial skull base. The petrous and the  proximal cavernous segments are widely patent. There is approximately 65% stenosis of the left internal carotid artery, proximal supraclinoid segment. The ophthalmic artery is widely patent. The left middle cerebral artery demonstrates mild narrowing in its M1 segment. The left MCA trifurcation branches are widely patent into the capillary and venous phases. The left anterior cerebral artery A1 segment demonstrates approximately 50-70% stenoses. Distal to this the left anterior cerebral A2 segment demonstrates normal opacification into the capillary and venous phases. There is prompt opacification via the anterior communicating artery of the right anterior cerebral artery A2 segment and distally. Also demonstrated is a conical shaped outpouching in the region of the left posterior communicating artery most compatible with an infundibulum. The left  subclavian arteriogram demonstrates a severe high-grade focal stenosis distal to its origin associated with filling defects and slow opacification of the left subclavian artery distally. 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 Approximately 50% stenosis of the right vertebral artery at its origin. Approximately 65% stenosis of the left internal carotid artery supraclinoid segment. Approximately 70% stenosis of the right middle cerebral artery M1 segment proximally. Approximately 50-70% stenoses of the anterior cerebral arteries A1 segments bilaterally. PLAN: Angiographic findings were reviewed with the patient. Electronically Signed   By: Luanne Bras M.D.   On: 10/10/2016 18:24   Ir Angio Vertebral Sel Subclavian Innominate Uni R Mod Sed  Result Date: 10/11/2016 CLINICAL DATA:  Left-sided weakness with confusion. Right hemispheric watershed ischemic strokes. Abnormal MRI of the brain. EXAM: BILATERAL COMMON CAROTID AND INNOMINATE ANGIOGRAPHY; LEFT EXTREMITY ARTERIOGRAPHY; IR ANGIO VERTEBRAL SEL SUBCLAVIAN INNOMINATE UNI RIGHT MOD SED COMPARISON:  MRI of the brain of 10/08/2016, and CT angiogram of the brain of 10/09/2016. MEDICATIONS: Heparin 1500 units IV; no antibiotic was administered within 1 hour of the procedure. ANESTHESIA/SEDATION: Versed 0.5 mg IV; Fentanyl 50 mcg IV. Moderate Sedation Time:  28 minutes. The patient was continuously monitored during the procedure by the interventional radiology nurse under my direct supervision. CONTRAST:  Isovue 300 approximately 55 mL. FLUOROSCOPY TIME:  Fluoroscopy Time: 9 minutes 54 seconds (971 mGy). COMPLICATIONS: None immediate. TECHNIQUE: Informed written consent was obtained from the patient after a thorough discussion of the  procedural risks, benefits and alternatives. All questions were addressed. Maximal Sterile Barrier Technique was utilized including caps, mask, sterile gowns, sterile gloves, sterile drape, hand hygiene and skin antiseptic. A timeout was performed prior to the initiation of the procedure. The right groin was prepped and draped in the usual sterile fashion. Thereafter using modified Seldinger technique, transfemoral access into the right common femoral artery was obtained without difficulty. Over a 0.035 inch guidewire, a 5 French Pinnacle sheath was inserted. Through this, and also over 0.035 inch guidewire, a 5 [place] JB 1 catheter was advanced to the aortic arch region and selectively positioned in the right common carotid artery, , the right subclavian artery, the left common carotid artery and the left subclavian artery. FINDINGS: The right common carotid arteriogram demonstrates the right external carotid artery and its major branches to widely patent. The right internal carotid artery has a smooth shallow plaque along its medial wall without associated ulcerations or stenosis by the NASCET criteria. Distal to this the vessel is seen to opacify normally to the cranial skull base. The petrous, cavernous and supraclinoid segments are widely  patent. Transient opacification via the right posterior communicating artery of the right posterior cerebral artery distribution is seen. The right middle cerebral artery and the proximal M1 segment demonstrates 70% focal stenosis due to a smooth circumferential plaque. Distal to this there is mild post stenotic dilatation. The right middle cerebral artery trifurcation branches are seen to opacify normally into the capillary and venous phases. There is approximately 50-70% stenoses of the anterior cerebral artery proximal A1 segment. Distal to this flow is noted into the right anterior cerebral artery into the pericallosal and the callosal marginal branches. In flow of  non-opacified blood into the right anterior cerebral artery A2 segment is seen via the anterior communicating artery from the left internal carotid artery. The right vertebral artery origin demonstrates a 50% stenosis at its origin. Distal to this the vessel is seen to opacify normally to the cranial skull base. Normal opacification is seen of the right vertebrobasilar junction and the right posterior-inferior cerebellar artery. The basilar artery demonstrates approximately 30% stenosis in its mid basilar artery distal to the anterior-inferior cerebellar arteries, and of a 50% proximal to the termination into the posterior cerebral arteries. The posterior cerebral arteries, superior cerebellar arteries and the anterior-inferior cerebellar arteries are seen to opacify into the capillary and venous phases. The left common carotid arteriogram demonstrates the left external carotid artery and its major branches to be widely patent. There is a smooth shallow plaque along the anterior wall of the left common carotid artery just proximal to its bifurcation. The left internal carotid artery at the bulb is widely patent. There is mild smooth caliber irregularity of the left internal carotid artery just distal to the bulb which probably represents mild FMD-like changes. Distal to this the left internal carotid artery is seen to opacify to the cranial skull base. The petrous and the proximal cavernous segments are widely patent. There is approximately 65% stenosis of the left internal carotid artery, proximal supraclinoid segment. The ophthalmic artery is widely patent. The left middle cerebral artery demonstrates mild narrowing in its M1 segment. The left MCA trifurcation branches are widely patent into the capillary and venous phases. The left anterior cerebral artery A1 segment demonstrates approximately 50-70% stenoses. Distal to this the left anterior cerebral A2 segment demonstrates normal opacification into the  capillary and venous phases. There is prompt opacification via the anterior communicating artery of the right anterior cerebral artery A2 segment and distally. Also demonstrated is a conical shaped outpouching in the region of the left posterior communicating artery most compatible with an infundibulum. The left subclavian arteriogram demonstrates a severe high-grade focal stenosis distal to its origin associated with filling defects and slow opacification of the left subclavian artery distally. 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 Approximately 50% stenosis of the right vertebral artery at its origin. Approximately 65% stenosis of the left internal carotid artery supraclinoid segment. Approximately 70% stenosis of the right middle cerebral artery M1 segment proximally. Approximately 50-70% stenoses of the anterior cerebral arteries A1 segments bilaterally. PLAN: Angiographic findings were reviewed with the patient. Electronically Signed   By: Luanne Bras M.D.   On: 10/10/2016 18:24    Microbiology: Recent Results (from the past 240 hour(s))  Blood culture (routine x 2)     Status: None (Preliminary result)   Collection Time:  10/08/16  7:39 PM  Result Value Ref Range Status   Specimen Description BLOOD RIGHT ARM  Final   Special Requests   Final    BOTTLES DRAWN AEROBIC AND ANAEROBIC Blood Culture adequate volume   Culture NO GROWTH 3 DAYS  Final   Report Status PENDING  Incomplete  Blood culture (routine x 2)     Status: None (Preliminary result)   Collection Time: 10/08/16  7:39 PM  Result Value Ref Range Status   Specimen Description BLOOD RIGHT HAND  Final   Special Requests IN PEDIATRIC BOTTLE Blood Culture adequate volume  Final   Culture NO GROWTH 3 DAYS  Final    Report Status PENDING  Incomplete     Labs: Basic Metabolic Panel:  Recent Labs Lab 10/08/16 1523 10/10/16 0945 10/10/16 1349  NA 140 140 137  K 3.5 2.9* 3.0*  CL 101 99* 101  CO2 28  --  30  GLUCOSE 140* 102* 179*  BUN 44* 17 13  CREATININE 1.23* 0.70 0.72  CALCIUM 9.3  --  8.6*   Liver Function Tests:  Recent Labs Lab 10/08/16 1523  AST 21  ALT 15  ALKPHOS 76  BILITOT 0.8  PROT 6.7  ALBUMIN 3.1*   No results for input(s): LIPASE, AMYLASE in the last 168 hours. No results for input(s): AMMONIA in the last 168 hours. CBC:  Recent Labs Lab 10/08/16 1523 10/10/16 0651 10/10/16 0945  WBC 15.6* 9.1  --   HGB 15.4* 13.0 11.9*  HCT 47.5* 39.9 35.0*  MCV 93.5 92.1  --   PLT 286 262  --    Cardiac Enzymes:  Recent Labs Lab 10/08/16 1850  CKTOTAL 195   BNP: BNP (last 3 results) No results for input(s): BNP in the last 8760 hours.  ProBNP (last 3 results) No results for input(s): PROBNP in the last 8760 hours.  CBG:  Recent Labs Lab 10/10/16 1200 10/10/16 1652 10/10/16 2102 10/11/16 0607 10/11/16 1110  GLUCAP 109* 126* 135* 140* 95    Signed:  Velvet Bathe MD.  [facility] 10/11/2016, 4:06 PM

## 2016-10-11 NOTE — PMR Pre-admission (Signed)
PMR Admission Coordinator Pre-Admission Assessment  Patient: [name] is an 56 y.o., female MRN: [id number] DOB: 1960-07-18 Height: 5\' 3"  (160 cm) Weight: 90.7 kg (200 lb)              Insurance Information HMO: No  PPO:       PCP:       IPA:       80/20:       OTHER:   PRIMARY:  Medicaid [place] access      Policy#:  [id number]      Subscriber:  Patient CM Name:        Phone#:       Fax#:   Pre-Cert#:        Employer: Disabled Benefits:  Phone #: [phone]     Name:  Automated Eff. 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 right handed female with history of hypertension, diabetes mellitus, breast cancer in remission, chronic back pain, tobacco abuse. Presented 10/09/2016 with right side weakness, slurred speech as well as recent fall and had been essentially bedbound for the past week or so. Per chart review patient lives with boyfriend. One level home for steps to entry. Independent prior to admission. Cranial CT scan showed no evidence of acute infarction. Blood pressure 88/40. Urine drug screen positive for opiates and benzos. MRI of the brain showed numerous acute small frontoparietal infarcts including the small vessel and MCA territories. MRI cervical spine without neurocompression. CT angiogram of head and neck showed no emergent large vessel occlusion. 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 Lower extremity Dopplers negative. Patient did not  receive TPA. Cerebral angiogram 10/10/2016 per interventional radiology showed approximately 70% stenosis right MCA proximal M1 segment. Approximately 50% stenosis of right VA origin. Echocardiogram with ejection fraction 65% no wall motion abnormalities. Presently on aspirin for CVA prophylaxis. Subcutaneous Lovenox for DVT prophylaxis. Physical and occupational therapy evaluations completed with recommendations of physical medicine rehabilitation consult.    Total: 6=NIH  Past Medical History  Past Medical History:  Diagnosis Date  . Asthma   . Back muscle spasm 12/13/2010  . Cancer [facility])    breast cancer, remission 2010  . Chronic back pain   . Chronic foot pain   . COPD (chronic obstructive pulmonary disease) ([place]) 09/14/2010  . H/O MRSA infection 09/14/2010  . High blood cholesterol   . Hyperlipidemia 12/07/2012  . Hypertension   . 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 Psoriasis 12/13/2010  . Tobacco abuse 12/07/2012    Family History  family history includes Cancer in her maternal aunt, maternal aunt, maternal aunt, and maternal grandmother; Stroke in her father and mother.  Prior Rehab/Hospitalizations: No previous rehab.  Has the patient had major surgery during  100 days prior to admission? No  Current Medications   Current Facility-Administered Medications:  .   stroke: mapping our early stages of recovery book, , Does not apply, Once, Norval Morton, MD, Stopped at 10/09/16 0800 .  0.9 %  sodium chloride infusion, , Intravenous, Continuous, Smith, Rondell A, MD, Stopped at 10/09/16 1322 .  acetaminophen (TYLENOL) tablet 650 mg, 650 mg, Oral, Q4H PRN **OR** acetaminophen (TYLENOL) solution 650 mg, 650 mg, Per Tube, Q4H PRN **OR** acetaminophen (TYLENOL) suppository 650 mg, 650 mg, Rectal, Q4H PRN, Smith, Rondell A, MD .  ALPRAZolam Duanne Moron) tablet 1 mg, 1 mg, Oral, BID PRN, Fuller Plan A, MD, 1 mg at 10/11/16 0241 .  aspirin suppository 300 mg, 300 mg, Rectal,  Daily **OR** aspirin tablet 325 mg, 325 mg, Oral, Daily, Smith, Rondell A, MD, 325 mg at 10/11/16 1018 .  atorvastatin (LIPITOR) tablet 10 mg, 10 mg, Oral, q1800, Rosalin Hawking, MD, 10 mg at 10/10/16 1659 .  clopidogrel (PLAVIX) tablet 75 mg, 75 mg, Oral, Daily, Patteson, Samuel A, NP, 75 mg at 10/11/16 1019 .  enoxaparin (LOVENOX) injection 40 mg, 40 mg, Subcutaneous, Daily, Monia Sabal, PA-C, 40 mg at 10/11/16 1018 .  insulin aspart (novoLOG) injection 0-5 Units, 0-5 Units, Subcutaneous, QHS, Smith, Rondell A, MD .  insulin aspart (novoLOG) injection 0-9 Units, 0-9 Units, Subcutaneous, TID WC, Fuller Plan A, MD, 1 Units at 10/11/16 [id number] .  nystatin (MYCOSTATIN/NYSTOP) topical powder, , Topical, TID, Schorr, Rhetta Mura, NP .  oxyCODONE (Oxy IR/ROXICODONE) immediate release tablet 15 mg, 15 mg, Oral, Q4H PRN, Tamala Julian, Rondell A, MD, 15 mg at 10/11/16 1317 .  oxyCODONE (OXYCONTIN) 12 hr tablet 10 mg, 10 mg, Oral, Q12H, Smith, Rondell A, MD, 10 mg at 10/11/16 1019 .  senna-docusate (Senokot-S) tablet 1 tablet, 1 tablet, Oral, QHS PRN, Fuller Plan A, MD, 1 tablet at 10/10/16 1709  Patients Current Diet: Diet Carb Modified Fluid consistency: Thin; Room service appropriate? Yes  Precautions / Restrictions Precautions Precautions: Fall Restrictions Weight Bearing Restrictions: No   Has the patient had 2 or more falls or a fall with injury in the past year?Yes  Prior Activity Level Household: Homebound most of time.  Has no car, no transportation.  Home Assistive Devices / Equipment Home Equipment: None  Prior Device Use: Indicate devices/aids used by the patient prior to current illness, exacerbation or injury? None  Prior Functional Level Prior Function Level of Independence: Independent Comments: patient cares for grandchildren, states that she rides horses and is very independent. Patient questionable historian at times  Self Care: Did the patient need help bathing, dressing,  using the toilet or eating?  Independent  Indoor Mobility: Did the patient need assistance with walking from room to room (with or without device)? Independent  Stairs: Did the patient need assistance with internal or external stairs (with or without device)? Independent  Functional Cognition: Did the patient need help planning regular tasks such as shopping or remembering to take medications? Independent  Current Functional Level Cognition  Overall Cognitive Status: Impaired/Different from baseline Current Attention Level: Sustained Orientation Level: Oriented X4 Following Commands: Follows one step commands inconsistently, Follows one step commands with increased time Safety/Judgement: Decreased awareness of safety, Decreased awareness of deficits General Comments: easily distracted; pt required frequent vc for redirecting to task at hand and following through with tasks; pt a little impulsive at times     Extremity Assessment (includes Sensation/Coordination)  Upper Extremity Assessment: Defer to OT evaluation  Lower Extremity  Assessment: Difficult to assess due to impaired cognition, RLE deficits/detail RLE Deficits / Details: noted decreased functional strength and coordination. Patient tangential and difficulty sequencing strength assessment RLE Coordination: decreased fine motor, decreased gross motor    ADLs       Mobility  Overal bed mobility: Needs Assistance Bed Mobility: Rolling, Sidelying to Sit Rolling: Min guard Sidelying to sit: Min assist Supine to sit: Mod assist General bed mobility comments: cues for sequencing; assist to elevate trunk into sitting    Transfers  Overall transfer level: Needs assistance Equipment used: Rolling walker (2 wheeled) Transfers: Sit to/from Stand Sit to Stand: Min assist General transfer comment: cues for safety; pt began sitting prematurely and required assist to safely sit onto chair     Ambulation / Gait / Stairs /  Wheelchair Mobility  Ambulation/Gait Ambulation/Gait assistance: Museum/gallery curator (Feet): 70 Feet Assistive device: Rolling walker (2 wheeled) Gait Pattern/deviations: Step-to pattern, Decreased stride length, Trunk flexed, Wide base of support, Step-through pattern, Decreased step length - right, Decreased dorsiflexion - right, Shuffle General Gait Details: mod cues for posture, increased bilat step length, and safe use of AD; pt with step through pattern initially but when fatigued began using step to pattern; standing rest break required; assist to steady and manage RW; pt required extra time and assist to turn around  Gait velocity: decreased    Posture / Balance Dynamic Sitting Balance Sitting balance - Comments: able to sit EOb and on toilet with min guard/ supervision Balance Overall balance assessment: Needs assistance Sitting-balance support: Feet supported, Bilateral upper extremity supported Sitting balance-Leahy Scale: Fair Sitting balance - Comments: able to sit EOb and on toilet with min guard/ supervision Standing balance support: Bilateral upper extremity supported Standing balance-Leahy Scale: Poor Standing balance comment: reliance on UE support High level balance activites: Direction changes, Turns High Level Balance Comments: min to moderate assist for in room turns. poor awareness of space    Special needs/care consideration BiPAP/CPAP No CPM No Continuous Drip IV No Dialysis No      Life Vest No Oxygen No Special Bed No Trach Size No Wound Vac (area) no     Skin Has "yeast rash" in groin and perineal area and on thighs/hips.  Has a wound on little finger of left hand with scabbed area.                              Bowel mgmt: Last BM 10/11/16 Bladder mgmt: Up to bathroom with help Diabetic mgmt Yes, on oral medication at home    Previous Home Environment Living Arrangements: Spouse/significant other, Other relatives Available Help at  Discharge: Family Type of Home: Mobile home Home Layout: One level Home Access: Stairs to enter Entrance Stairs-Rails: Can reach both Entrance Stairs-Number of Steps: 4 Bathroom Shower/Tub: Chiropodist: Standard  Discharge Living Setting Plans for Discharge Living Setting: Lives with (comment), Mobile Home (Lives with boyfriend of 16 years.) Type of Home at Discharge: Mobile home (single wide mobile home.) Discharge Home Layout: One level Discharge Home Access: Stairs to enter Entrance Stairs-Number of Steps: 4 steps Does the patient have any problems obtaining your medications?: No  Social/Family/Support Systems Patient Roles: Parent, Other (Comment) (Has a boyfriend, 2 sons, 3 daughters.)  One son is in prison and one daughter likes to "be on the streets". Contact Information: Raiford Noble - brother - [phone] Anticipated Caregiver: self and BF, Juan Ability/Limitations of  Caregiver: Elita Quick works FT in tobacco and in the fields. Caregiver Availability: Intermittent Discharge Plan Discussed with Primary Caregiver: Yes Is Caregiver In Agreement with Plan?: Yes Does Caregiver/Family have Issues with Lodging/Transportation while Pt is in Rehab?: No  Goals/Additional Needs Patient/Family Goal for Rehab: PT mod I, OT mod I and supervision, SLP mod I and supervision goals Expected length of stay: 8-12 days Cultural Considerations: Holiness Dietary Needs: Carb mod, med cal, thin liquids Equipment Needs: TBD Additional Information: Does not read of write above 3rd grade level.  She enjoys gardening and takes care of her grandchildren as needed. Pt/Family Agrees to Admission and willing to participate: Yes Program Orientation Provided & Reviewed with Pt/Caregiver Including Roles  & Responsibilities: Yes  Decrease burden of Care through IP rehab admission: N/A  Possible need for SNF placement upon discharge: Not planned  Patient Condition: This patient's  condition remains as documented in the consult dated 10/11/16, in which the Rehabilitation Physician determined and documented that the patient's condition is appropriate for intensive rehabilitative care in an inpatient rehabilitation facility. Will admit to inpatient rehab today.  Preadmission Screen Completed By:  Retta Diones, 10/11/2016 2:51 PM ______________________________________________________________________   Discussed status with Dr. Naaman Plummer on 10/11/16 at 1451 and received telephone approval for admission today.  Admission Coordinator:  Retta Diones, time 1451/Date 10/11/16

## 2016-10-11 NOTE — Consult Note (Signed)
Physical Medicine and Rehabilitation Consult Reason for Consult: Right side weakness Referring Physician: [facility]   HPI: [name] is a 56 y.o. right handed female with history of hypertension, diabetes mellitus, breast cancer in remission, chronic back pain, tobacco abuse. Presented 10/09/2016 with right side weakness, slurred speech as well as recent fall and had been essentially bedbound for the past week or so. Per chart review patient lives with boyfriend. One level home for steps to entry. Independent prior to admission. Cranial CT scan showed no evidence of acute infarction. Blood pressure 88/40. Urine drug screen positive for opiates and benzos. MRI of the brain showed numerous acute small frontoparietal infarcts including the small vessel and MCA territories. MRI cervical spine without neurocompression. CT angiogram of head and neck showed no emergent large vessel occlusion. 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 Lower extremity Dopplers negative. Patient did not receive TPA. Cerebral angiogram 10/10/2016 per interventional radiology showed approximately 70% stenosis right MCA proximal M1 segment. Approximately 50% stenosis of right VA origin. Echocardiogram with ejection fraction 65% no wall motion abnormalities. Presently on aspirin for CVA prophylaxis. Subcutaneous Lovenox for DVT prophylaxis. Physical and occupational therapy evaluations completed with recommendations of physical medicine rehabilitation consult.   Review of Systems  Constitutional: Negative for chills and fever.  HENT: Negative for hearing loss.   Eyes: Negative for blurred vision and double vision.  Respiratory: Positive for shortness of breath.   Cardiovascular: Positive for leg swelling. Negative for chest pain and palpitations.  Gastrointestinal: Positive for constipation. Negative for nausea.  Genitourinary: Negative for dysuria, flank  pain and hematuria.  Musculoskeletal: Positive for back pain.  Skin: Negative for rash.  Neurological: Positive for speech change and weakness. Negative for seizures.  Psychiatric/Behavioral:       Anxiety  All other systems reviewed and are negative.  Past Medical History:  Diagnosis Date  . Asthma   . Back muscle spasm 12/13/2010  . Cancer [facility])    breast cancer, remission 2010  . Chronic back pain   . Chronic foot pain   . COPD (chronic obstructive pulmonary disease) ([place]) 09/14/2010  . H/O MRSA infection 09/14/2010  . High blood cholesterol   . Hyperlipidemia 12/07/2012  . Hypertension   . 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 Psoriasis 12/13/2010  . Tobacco abuse 12/07/2012   Past Surgical History:  Procedure Laterality Date  . ABDOMINAL HYSTERECTOMY    . CESAREAN SECTION     x7  . MASTECTOMY     left  . MASTECTOMY    . PORT-A-CATH REMOVAL  06/01/2011   Procedure: REMOVAL PORT-A-CATH;  Surgeon: Donato Heinz, MD;  Location: [facility];  Service: General;  Laterality: N/A;  Minor Room  . portacath insertion     Family History  Problem Relation Age of Onset  . Stroke Mother   . Stroke Father   . Cancer Maternal Aunt   . Cancer Maternal Grandmother   . Cancer Maternal Aunt   . Cancer Maternal Aunt    Social History:  reports that she has been smoking.  She has a 9.00 pack-year smoking history. She has never used smokeless tobacco. She reports that she uses drugs, including Marijuana. She reports that she does not drink alcohol. Allergies:  Allergies  Allergen Reactions  . Codeine Anaphylaxis  . Penicillins Anaphylaxis and Other (See Comments)    Has patient had a PCN reaction causing immediate rash, facial/tongue/throat  swelling, SOB or lightheadedness with hypotension: Yes Has patient had a PCN reaction causing severe rash involving mucus membranes or skin necrosis: No Has patient had a PCN reaction that required hospitalization: Yes Has patient had a PCN  reaction occurring within the last 10 years: No If all of the above answers are "NO", then may proceed with Cephalosporin use.    Medications Prior to Admission  Medication Sig Dispense Refill  . ALPRAZolam (XANAX) 1 MG tablet Take 1 tablet (1 mg total) by mouth 2 (two) times daily as needed. for anxiety (Patient taking differently: Take 1 mg by mouth 2 (two) times daily as needed for anxiety. ) 60 tablet 5  . amLODipine (NORVASC) 5 MG tablet Take 5 mg by mouth daily.    Marland Kitchen aspirin 81 MG chewable tablet Chew 81 mg by mouth daily.    . diclofenac sodium (VOLTAREN) 1 % GEL Apply 2-4 g topically 4 (four) times daily.    . Ipratropium-Albuterol (COMBIVENT RESPIMAT) 20-100 MCG/ACT AERS respimat Inhale 1-2 puffs into the lungs every 4 (four) hours as needed for wheezing or shortness of breath.    Marland Kitchen ipratropium-albuterol (DUONEB) 0.5-2.5 (3) MG/3ML SOLN Take 3 mLs by nebulization 2 (two) times daily.    Marland Kitchen losartan-hydrochlorothiazide (HYZAAR) 100-12.5 MG tablet Take 1 tablet by mouth daily.    . metFORMIN (GLUCOPHAGE) 500 MG tablet Take 500 mg by mouth 2 (two) times daily with a meal.    . metoprolol succinate (TOPROL-XL) 50 MG 24 hr tablet Take 50 mg by mouth daily. Take with or immediately following a meal.    . Olopatadine HCl (PATADAY) 0.2 % SOLN Place 1 drop into both eyes daily.    Marland Kitchen oxyCODONE (ROXICODONE) 15 MG immediate release tablet Take 15 mg by mouth every 4 (four) hours as needed for pain.  0  . OxyCODONE ER (XTAMPZA ER) 9 MG C12A Take 9 mg by mouth every 12 (twelve) hours.    . terbinafine (LAMISIL) 250 MG tablet Take 250 mg by mouth daily.    Marland Kitchen zolpidem (AMBIEN) 10 MG tablet Take 10 mg by mouth at bedtime.    . prochlorperazine (COMPAZINE) 10 MG tablet Take 10 mg by mouth every 6 (six) hours as needed for nausea or vomiting.      Home: Home Living Family/patient expects to be discharged to:: Private residence Living Arrangements: Spouse/significant other, Other relatives Available  Help at Discharge: Family Type of Home: Mobile home Home Access: Stairs to enter Entrance Stairs-Number of Steps: 4 Entrance Stairs-Rails: Can reach both [place]: One level Bathroom Shower/Tub: Chiropodist: Standard Home Equipment: None  Functional History: Prior Function Level of Independence: Independent Comments: patient cares for grandchildren, states that she rides horses and is very independent. Patient questionable historian at times Functional Status:  Mobility: Bed Mobility Overal bed mobility: Needs Assistance Bed Mobility: Rolling, Supine to Sit Rolling: Min assist Supine to sit: Mod assist General bed mobility comments: moderate assist to elevate trunk and rotate to EOB. multi modal cues for task performance Transfers Overall transfer level: Needs assistance Equipment used: Rolling walker (2 wheeled) Transfers: Sit to/from Stand Sit to Stand: Min assist General transfer comment: Max cues for safety and technique to power up and sit. Patient with decrease awareness of environment, attempting to sit prematurely on several ocassions. Ambulation/Gait Ambulation/Gait assistance: Min assist Ambulation Distance (Feet): 16 Feet Assistive device: Rolling walker (2 wheeled) Gait Pattern/deviations: Step-to pattern, Decreased stride length, Shuffle, Trunk flexed, Wide base of support General  Gait Details: patient with poor ability to maintain position in RW, hands on physical assist required. Min assist for stability. max cues for positioning    ADL:    Cognition: Cognition Overall Cognitive Status: No family/caregiver present to determine baseline cognitive functioning Orientation Level: Oriented X4 Cognition Arousal/Alertness: Awake/alert Behavior During Therapy: WFL for tasks assessed/performed Overall Cognitive Status: No family/caregiver present to determine baseline cognitive functioning Area of Impairment: Attention, Following commands,  Safety/judgement, Awareness, Problem solving Current Attention Level: Sustained Following Commands: Follows one step commands inconsistently, Follows one step commands with increased time Safety/Judgement: Decreased awareness of safety, Decreased awareness of deficits Awareness: Intellectual Problem Solving: Slow processing, Difficulty sequencing, Requires verbal cues, Requires tactile cues, Decreased initiation General Comments: patient with poor insight and awareness, unable to process basic tasks to completion.   Blood pressure (!) 153/72, pulse 68, temperature 98.3 F (36.8 C), temperature source Oral, resp. rate 20, height 5\' 3"  (1.6 m), weight 90.7 kg (200 lb), SpO2 96 %. Physical Exam  HENT:  Head: Normocephalic.  Eyes: EOM are normal.  Neck: Normal range of motion. Neck supple. No thyromegaly present.  Cardiovascular: Normal rate, regular rhythm and normal heart sounds.   Respiratory: Effort normal and breath sounds normal. No respiratory distress.  GI: Soft. Bowel sounds are normal. She exhibits no distension.  Neurological: She is alert.  Makes good eye contact with examiner. She can provide her name but was uncertain of her date of birth. Limited medical historian. She was able to follow simple commands. RUE 3 to 3+/5  RLE 4/5 prox to distal. LUE and LLE 4+ to 5/5. ?decrease of LT RUE and RLE. Speech slightly dysarthric. Language normal    Results for orders placed or performed during the hospital encounter of 10/08/16 (from the past 24 hour(s))  Rapid urine drug screen (hospital performed)     Status: Abnormal   Collection Time: 10/10/16  6:46 AM  Result Value Ref Range   Opiates POSITIVE (A) NONE DETECTED   Cocaine NONE DETECTED NONE DETECTED   Benzodiazepines POSITIVE (A) NONE DETECTED   Amphetamines NONE DETECTED NONE DETECTED   Tetrahydrocannabinol NONE DETECTED NONE DETECTED   Barbiturates NONE DETECTED NONE DETECTED  CBC     Status: None   Collection Time: 10/10/16   6:51 AM  Result Value Ref Range   WBC 9.1 4.0 - 10.5 K/uL   RBC 4.33 3.87 - 5.11 MIL/uL   Hemoglobin 13.0 12.0 - 15.0 g/dL   HCT 39.9 36.0 - 46.0 %   MCV 92.1 78.0 - 100.0 fL   MCH 30.0 26.0 - 34.0 pg   MCHC 32.6 30.0 - 36.0 g/dL   RDW 14.6 11.5 - 15.5 %   Platelets 262 150 - 400 K/uL  Glucose, capillary     Status: Abnormal   Collection Time: 10/10/16  6:54 AM  Result Value Ref Range   Glucose-Capillary 100 (H) 65 - 99 mg/dL   Comment 1 Notify RN    Comment 2 Document in Chart   I-STAT, chem 8     Status: Abnormal   Collection Time: 10/10/16  9:45 AM  Result Value Ref Range   Sodium 140 135 - 145 mmol/L   Potassium 2.9 (L) 3.5 - 5.1 mmol/L   Chloride 99 (L) 101 - 111 mmol/L   BUN 17 6 - 20 mg/dL   Creatinine, Ser 0.70 0.44 - 1.00 mg/dL   Glucose, Bld 102 (H) 65 - 99 mg/dL   Calcium, Ion 1.25 1.15 - 1.40  mmol/L   TCO2 30 0 - 100 mmol/L   Hemoglobin 11.9 (L) 12.0 - 15.0 g/dL   HCT 35.0 (L) 36.0 - 46.0 %  Glucose, capillary     Status: Abnormal   Collection Time: 10/10/16 12:00 PM  Result Value Ref Range   Glucose-Capillary 109 (H) 65 - 99 mg/dL  Basic metabolic panel     Status: Abnormal   Collection Time: 10/10/16  1:49 PM  Result Value Ref Range   Sodium 137 135 - 145 mmol/L   Potassium 3.0 (L) 3.5 - 5.1 mmol/L   Chloride 101 101 - 111 mmol/L   CO2 30 22 - 32 mmol/L   Glucose, Bld 179 (H) 65 - 99 mg/dL   BUN 13 6 - 20 mg/dL   Creatinine, Ser 0.72 0.44 - 1.00 mg/dL   Calcium 8.6 (L) 8.9 - 10.3 mg/dL   GFR calc non Af Amer >60 >60 mL/min   GFR calc Af Amer >60 >60 mL/min   Anion gap 6 5 - 15  Protime-INR     Status: Abnormal   Collection Time: 10/10/16  1:49 PM  Result Value Ref Range   Prothrombin Time 15.3 (H) 11.4 - 15.2 seconds   INR 1.20   APTT     Status: None   Collection Time: 10/10/16  1:49 PM  Result Value Ref Range   aPTT 32 24 - 36 seconds  Glucose, capillary     Status: Abnormal   Collection Time: 10/10/16  4:52 PM  Result Value Ref Range    Glucose-Capillary 126 (H) 65 - 99 mg/dL   Comment 1 Notify RN    Comment 2 Document in Chart   Glucose, capillary     Status: Abnormal   Collection Time: 10/10/16  9:02 PM  Result Value Ref Range   Glucose-Capillary 135 (H) 65 - 99 mg/dL   Comment 1 Notify RN    Comment 2 Document in Chart    Ct Angio Head W Or Wo Contrast  Addendum Date: 10/09/2016   ADDENDUM REPORT: 10/09/2016 23:04 ADDENDUM: Critical Value/emergent results were called by telephone at the time of interpretation on 10/09/2016 at 11:04 pm to Dr. Cheral Marker, Neurology, who verbally acknowledged these results. Electronically Signed   By: Elon Alas M.D.   On: 10/09/2016 23:04   Result Date: 10/09/2016 CLINICAL DATA:  Follow-up stroke. History of hypertension, hyperlipidemia and breast cancer. EXAM: CT ANGIOGRAPHY HEAD AND NECK TECHNIQUE: Multidetector CT imaging of the head and neck was performed using the standard protocol during bolus administration of intravenous contrast. Multiplanar CT image reconstructions and MIPs were obtained to evaluate the vascular anatomy. Carotid stenosis measurements (when applicable) are obtained utilizing NASCET criteria, using the distal internal carotid diameter as the denominator. CONTRAST:  50 cc Isovue 370 COMPARISON:  MRI of the head October 08, 2016 FINDINGS: CT HEAD FINDINGS BRAIN: No intraparenchymal hemorrhage, mass effect or midline shift. Focal mesial RIGHT occipital lobe encephalomalacia. Patchy RIGHT parietal hypodensities. Patchy white matter hypodensities corresponding to acute infarcts. Mild prominence of the ventricles and sulci for patient's age. No abnormal extra-axial fluid collections. VASCULAR: Mild calcific atherosclerosis carotid siphon found vertebral artery's. SKULL/SOFT TISSUES: No skull fracture. No significant soft tissue swelling. ORBITS/SINUSES: The included ocular globes and orbital contents are normal.Minimal RIGHT sphenoid mucosal thickening. No paranasal sinus  air-fluid levels. Mastoid air cells are well aerated. OTHER: Patient is edentulous. CTA NECK AORTIC ARCH: Mild intimal thickening calcific atherosclerosis. 2.9 cm segment LEFT subclavian artery origin occlusion with expanded appearance,  reconstitution at medially proximal to LEFT vertebral artery takeoff which is patent. Innominate and LEFT Common carotid artery origins are widely patent. Mild stenosis RIGHT proximal subclavian artery. RIGHT CAROTID SYSTEM: Common carotid artery is widely patent. Normal appearance of the carotid bifurcation without hemodynamically significant stenosis by NASCET criteria, mild intimal thickening. Normal appearance of the included internal carotid artery. LEFT CAROTID SYSTEM: Common carotid artery is widely patent. Normal appearance of the carotid bifurcation without hemodynamically significant stenosis by NASCET criteria, mild calcific atherosclerosis. Normal appearance of the included internal carotid artery. VERTEBRAL ARTERIES:Codominant vertebral artery's. Moderate stenosis RIGHT vertebral artery origin. Bilateral vertebral artery's are patent throughout the course. SKELETON: No acute osseous process though bone windows have not been submitted. OTHER NECK: Soft tissues of the neck are non-acute though, not tailored for evaluation. Centrilobular emphysema included lung apices. CTA HEAD ANTERIOR CIRCULATION: Patent cervical internal carotid arteries, petrous, cavernous and supra clinoid internal carotid arteries. 3 mm posteriorly directed LEFT posterior communicating origin at ICA. Calcific atherosclerosis resulting in 2 mm segment severe stenosis LEFT para clinoid internal carotid artery, moderate stenosis RIGHT paraclinoid internal carotid artery. Thready bilateral A1 segments, patent anterior communicating artery. Severe stenosis LEFT A2 segment. Moderate luminal irregularity bilateral middle cerebral artery's including moderate stenosis proximal RIGHT M1 segment. Severe focal  stenosis LEFT M3 segment. No large vessel occlusion, contrast extravasation or aneurysm. POSTERIOR CIRCULATION: Patent vertebral arteries, vertebrobasilar junction and basilar artery, as well as main branch vessels. Mildly stenotic LEFT vertebral artery due to the calcific atherosclerosis. Patent posterior cerebral arteries, moderate luminal irregularity. No large vessel occlusion, contrast extravasation. VENOUS SINUSES: Major dural venous sinuses are patent though not tailored for evaluation on this angiographic examination. ANATOMIC VARIANTS: None. DELAYED PHASE: No abnormal intracranial enhancement MIP images reviewed. IMPRESSION: CT HEAD: 1. Evolving small acute watershed and RIGHT MCA territory infarcts without hemorrhagic conversion. 2. Old small RIGHT frontal/ACA territory infarct. 3. Mild parenchymal brain volume loss for age. CTA NECK: 1. 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 Reconstitution proximal to LEFT vertebral artery concerning for steal. 2. Atherosclerosis without hemodynamically significant stenosis carotid artery's. 3. Moderate stenosis RIGHT vertebral artery origin. CTA HEAD: 1. No emergent large vessel occlusion. 2. Severe stenoses LEFT paraclinoid ICA, LEFT anterior cerebral artery and LEFT middle cerebral artery. 3. Moderate general cerebral artery atherosclerosis and stenoses. Electronically Signed: By: Elon Alas M.D. On: 10/09/2016 22:47   Ct Angio Neck W Or Wo Contrast  Addendum Date: 10/09/2016   ADDENDUM REPORT: 10/09/2016 23:04 ADDENDUM: Critical Value/emergent results were called by telephone at the time of interpretation on 10/09/2016 at 11:04 pm to Dr. Cheral Marker, Neurology, who verbally acknowledged these results. Electronically Signed   By: Elon Alas M.D.   On: 10/09/2016 23:04   Result Date: 10/09/2016 CLINICAL DATA:  Follow-up stroke. History of hypertension, hyperlipidemia and breast cancer. EXAM: CT ANGIOGRAPHY HEAD AND NECK TECHNIQUE:  Multidetector CT imaging of the head and neck was performed using the standard protocol during bolus administration of intravenous contrast. Multiplanar CT image reconstructions and MIPs were obtained to evaluate the vascular anatomy. Carotid stenosis measurements (when applicable) are obtained utilizing NASCET criteria, using the distal internal carotid diameter as the denominator. CONTRAST:  50 cc Isovue 370 COMPARISON:  MRI of the head October 08, 2016 FINDINGS: CT HEAD FINDINGS BRAIN: No intraparenchymal hemorrhage, mass effect or midline shift. Focal mesial RIGHT occipital lobe encephalomalacia. Patchy RIGHT parietal hypodensities. Patchy white matter hypodensities corresponding to acute infarcts. Mild prominence of the ventricles and sulci for patient's  age. No abnormal extra-axial fluid collections. VASCULAR: Mild calcific atherosclerosis carotid siphon found vertebral artery's. SKULL/SOFT TISSUES: No skull fracture. No significant soft tissue swelling. ORBITS/SINUSES: The included ocular globes and orbital contents are normal.Minimal RIGHT sphenoid mucosal thickening. No paranasal sinus air-fluid levels. Mastoid air cells are well aerated. OTHER: Patient is edentulous. CTA NECK AORTIC ARCH: Mild intimal thickening calcific atherosclerosis. 2.9 cm segment LEFT subclavian artery origin occlusion with expanded appearance, reconstitution at medially proximal to LEFT vertebral artery takeoff which is patent. Innominate and LEFT Common carotid artery origins are widely patent. Mild stenosis RIGHT proximal subclavian artery. RIGHT CAROTID SYSTEM: Common carotid artery is widely patent. Normal appearance of the carotid bifurcation without hemodynamically significant stenosis by NASCET criteria, mild intimal thickening. Normal appearance of the included internal carotid artery. LEFT CAROTID SYSTEM: Common carotid artery is widely patent. Normal appearance of the carotid bifurcation without hemodynamically significant  stenosis by NASCET criteria, mild calcific atherosclerosis. Normal appearance of the included internal carotid artery. VERTEBRAL ARTERIES:Codominant vertebral artery's. Moderate stenosis RIGHT vertebral artery origin. Bilateral vertebral artery's are patent throughout the course. SKELETON: No acute osseous process though bone windows have not been submitted. OTHER NECK: Soft tissues of the neck are non-acute though, not tailored for evaluation. Centrilobular emphysema included lung apices. CTA HEAD ANTERIOR CIRCULATION: Patent cervical internal carotid arteries, petrous, cavernous and supra clinoid internal carotid arteries. 3 mm posteriorly directed LEFT posterior communicating origin at ICA. Calcific atherosclerosis resulting in 2 mm segment severe stenosis LEFT para clinoid internal carotid artery, moderate stenosis RIGHT paraclinoid internal carotid artery. Thready bilateral A1 segments, patent anterior communicating artery. Severe stenosis LEFT A2 segment. Moderate luminal irregularity bilateral middle cerebral artery's including moderate stenosis proximal RIGHT M1 segment. Severe focal stenosis LEFT M3 segment. No large vessel occlusion, contrast extravasation or aneurysm. POSTERIOR CIRCULATION: Patent vertebral arteries, vertebrobasilar junction and basilar artery, as well as main branch vessels. Mildly stenotic LEFT vertebral artery due to the calcific atherosclerosis. Patent posterior cerebral arteries, moderate luminal irregularity. No large vessel occlusion, contrast extravasation. VENOUS SINUSES: Major dural venous sinuses are patent though not tailored for evaluation on this angiographic examination. ANATOMIC VARIANTS: None. DELAYED PHASE: No abnormal intracranial enhancement MIP images reviewed. IMPRESSION: CT HEAD: 1. Evolving small acute watershed and RIGHT MCA territory infarcts without hemorrhagic conversion. 2. Old small RIGHT frontal/ACA territory infarct. 3. Mild parenchymal brain volume loss  for age. CTA NECK: 1. 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 Reconstitution proximal to LEFT vertebral artery concerning for steal. 2. Atherosclerosis without hemodynamically significant stenosis carotid artery's. 3. Moderate stenosis RIGHT vertebral artery origin. CTA HEAD: 1. No emergent large vessel occlusion. 2. Severe stenoses LEFT paraclinoid ICA, LEFT anterior cerebral artery and LEFT middle cerebral artery. 3. Moderate general cerebral artery atherosclerosis and stenoses. Electronically Signed: By: Elon Alas M.D. 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 Does the need for close, 24 hr/day medical supervision in concert with the patient's rehab needs make it unreasonable for this patient to be served in a less intensive setting? Yes 2. Co-Morbidities requiring supervision/potential complications: SIRS, hypokalemia, htn, dm2 3. Due to bladder management, bowel management, safety, skin/wound care, disease management, medication administration, pain management and patient education, does the patient require 24 hr/day rehab nursing? Yes 4. Does the patient require coordinated care of a physician, rehab nurse, PT (1-2 hrs/day, 5 days/week), OT (1-2 hrs/day, 5 days/week) and SLP (1-2 hrs/day, 5 days/week) to address physical and functional deficits in the context  of the above medical diagnosis(es)? Yes Addressing deficits in the following areas: balance, endurance, locomotion, strength, transferring, bowel/bladder control, bathing, dressing, feeding, grooming, toileting, cognition, speech and psychosocial support 5. Can the patient actively participate in an intensive therapy program of at least 3 hrs of therapy per day at least 5 days per week? Yes 6. The potential for patient to make measurable gains while on inpatient rehab is excellent 7. Anticipated functional outcomes upon discharge from  inpatient rehab are modified independent  with PT, modified independent and supervision with OT, modified independent and supervision with SLP. 8. Estimated rehab length of stay to reach the above functional goals is: 8-12 days 9. Anticipated D/C setting: Home 10. Anticipated post D/C treatments: [place] therapy 11. Overall Rehab/Functional Prognosis: excellent  RECOMMENDATIONS: This patient's condition is appropriate for continued rehabilitative care in the following setting: CIR Patient has agreed to participate in recommended program. Yes Note that insurance prior authorization may be required for reimbursement for recommended care.  Comment: Rehab Admissions Coordinator to follow up.  Thanks,  [name], MD, Mellody Drown    Cathlyn Parsons., PA-C 10/11/2016
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
## 2016-10-12 ENCOUNTER — Telehealth (HOSPITAL_COMMUNITY): Payer: Self-pay | Admitting: Radiology

## 2016-10-12 ENCOUNTER — Inpatient Hospital Stay (HOSPITAL_COMMUNITY): Payer: Medicaid Other | Admitting: Occupational Therapy

## 2016-10-12 ENCOUNTER — Inpatient Hospital Stay (HOSPITAL_COMMUNITY): Payer: Medicaid Other | Admitting: Speech Pathology

## 2016-10-12 ENCOUNTER — Inpatient Hospital Stay (HOSPITAL_COMMUNITY): Payer: Medicaid Other

## 2016-10-12 LAB — GLUCOSE, CAPILLARY
GLUCOSE-CAPILLARY: 132 mg/dL — AB (ref 65–99)
GLUCOSE-CAPILLARY: 140 mg/dL — AB (ref 65–99)
Glucose-Capillary: 119 mg/dL — ABNORMAL HIGH (ref 65–99)
Glucose-Capillary: 95 mg/dL (ref 65–99)

## 2016-10-12 LAB — CBC WITH DIFFERENTIAL/PLATELET
BASOS ABS: 0 10*3/uL (ref 0.0–0.1)
BASOS PCT: 1 %
Eosinophils Absolute: 0.2 10*3/uL (ref 0.0–0.7)
Eosinophils Relative: 4 %
HEMATOCRIT: 34.7 % — AB (ref 36.0–46.0)
HEMOGLOBIN: 11.3 g/dL — AB (ref 12.0–15.0)
LYMPHS PCT: 27 %
Lymphs Abs: 1.7 10*3/uL (ref 0.7–4.0)
MCH: 30.1 pg (ref 26.0–34.0)
MCHC: 32.6 g/dL (ref 30.0–36.0)
MCV: 92.3 fL (ref 78.0–100.0)
Monocytes Absolute: 0.4 10*3/uL (ref 0.1–1.0)
Monocytes Relative: 7 %
NEUTROS ABS: 3.9 10*3/uL (ref 1.7–7.7)
NEUTROS PCT: 61 %
Platelets: 225 10*3/uL (ref 150–400)
RBC: 3.76 MIL/uL — AB (ref 3.87–5.11)
RDW: 14.9 % (ref 11.5–15.5)
WBC: 6.3 10*3/uL (ref 4.0–10.5)

## 2016-10-12 LAB — COMPREHENSIVE METABOLIC PANEL
ALBUMIN: 2.7 g/dL — AB (ref 3.5–5.0)
ALT: 19 U/L (ref 14–54)
AST: 31 U/L (ref 15–41)
Alkaline Phosphatase: 58 U/L (ref 38–126)
Anion gap: 6 (ref 5–15)
BILIRUBIN TOTAL: 0.4 mg/dL (ref 0.3–1.2)
CO2: 29 mmol/L (ref 22–32)
CREATININE: 0.63 mg/dL (ref 0.44–1.00)
Calcium: 8.6 mg/dL — ABNORMAL LOW (ref 8.9–10.3)
Chloride: 103 mmol/L (ref 101–111)
GFR calc Af Amer: >60 mL/min (ref >60)
GLUCOSE: 117 mg/dL — AB (ref 65–99)
POTASSIUM: 3.2 mmol/L — AB (ref 3.5–5.1)
Sodium: 138 mmol/L (ref 135–145)
TOTAL PROTEIN: 5.9 g/dL — AB (ref 6.5–8.1)

## 2016-10-12 MED ORDER — POTASSIUM CHLORIDE CRYS ER 20 MEQ PO TBCR
20.0000 meq | EXTENDED_RELEASE_TABLET | Freq: Two times a day (BID) | ORAL | Status: AC
  Administered 2016-10-12 – 2016-10-13 (×4): 20 meq via ORAL
  Filled 2016-10-12 (×4): qty 1

## 2016-10-12 NOTE — Evaluation (Signed)
Occupational Therapy Assessment and Plan  Patient Details  Name: [name] MRN: [id number] Date of Birth: December 07, 1960  OT Diagnosis: muscle weakness (generalized) Rehab Potential: Rehab Potential (ACUTE ONLY): Excellent ELOS: 6-9 days   Today's Date: 10/12/2016 OT Individual Time: 1017-5102 OT Individual Time Calculation (min): 60 min     Problem List:  Patient Active Problem List   Diagnosis Date Noted  . Acute bilat watershed infarction [facility]) 10/11/2016  . 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 Cerebral embolism with cerebral infarction 10/09/2016  . CVA (cerebral vascular accident) ([place]) 10/09/2016  . Chronic pain syndrome 10/09/2016  . Diabetes mellitus type 2 in obese ([place]) 10/09/2016  . AKI (acute kidney injury) ([place])   . Right-sided muscle weakness   . Eye drainage 02/28/2014  . Conjunctivitis 02/28/2014  . Back pain 02/28/2014  . Hyperlipidemia 12/07/2012  . Tobacco abuse 12/07/2012  . Psoriasis 12/13/2010  . COPD (chronic obstructive pulmonary disease) ([place]) 09/14/2010  . 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 H/O MRSA infection 09/14/2010  . Anxiety state 05/13/2009  . DEPRESSION 05/13/2009  . Essential hypertension 05/13/2009  . H/O Stage III Breast cancer, left 05/13/2009    Past Medical History:  Past Medical History:  Diagnosis Date  . Asthma   . Back muscle spasm 12/13/2010  . Cancer [facility])    breast cancer, remission 2010  . Chronic back pain   . Chronic foot pain   . COPD (chronic obstructive pulmonary disease) ([place]) 09/14/2010  . H/O MRSA infection 09/14/2010  . High blood cholesterol   . Hyperlipidemia 12/07/2012  . Hypertension   . 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 Psoriasis 12/13/2010  . Tobacco abuse 12/07/2012   Past Surgical History:  Past Surgical History:  Procedure Laterality Date  . ABDOMINAL HYSTERECTOMY    . CESAREAN SECTION     x7  . IR ANGIO INTRA EXTRACRAN SEL COM CAROTID INNOMINATE BILAT MOD SED   10/10/2016  . IR ANGIO VERTEBRAL SEL SUBCLAVIAN INNOMINATE UNI R MOD SED  10/10/2016  . IR ANGIOGRAM EXTREMITY LEFT  10/10/2016  . MASTECTOMY     left  . MASTECTOMY    . PORT-A-CATH REMOVAL  06/01/2011   Procedure: REMOVAL PORT-A-CATH;  Surgeon: Donato Heinz, MD;  Location: [facility];  Service: General;  Laterality: N/A;  Minor Room  . portacath insertion      Assessment & Plan Clinical Impression: [name] is a 56 y.o. right handed female with history of hypertension, diabetes mellitus, breast cancer in remission, chronic back pain, tobacco abuse. Presented 10/09/2016 with right side weakness, slurred speech as well as recent fall and had been essentially bedbound for the past week or so. Per chart review patient lives with boyfriend. One level home for steps to entry. Independent prior to admission. Cranial CT scan showed no evidence of acute infarction. Blood pressure 88/40. Urine drug screen positive for opiates and benzos. MRI of the brain showed numerous acute small frontoparietal infarcts including the small vessel and MCA territories. MRI cervical spine without neurocompression. CT angiogram of head and neck showed no emergent large vessel occlusion. 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 Lower extremity Dopplers negative. Patient did not receive TPA. Cerebral angiogram 10/10/2016 per interventional radiology showed approximately 70% stenosis right MCA proximal M1 segment. Approximately 50% stenosis of right VA origin. Echocardiogram with ejection fraction 65% no wall motion abnormalities. Presently on aspirin and Plavix for CVA prophylaxis. Subcutaneous Lovenox  for DVT prophylaxis. Physical and occupational therapy evaluations completed with recommendations of physical medicine rehabilitation consult.Patient was admitted for a comprehensive rehabilitation program  Patient transferred to CIR on 10/11/2016 .    Patient  currently requires supervision with basic self-care skills secondary to muscle weakness, unbalanced muscle activation and decreased standing balance and decreased balance strategies.  Prior to hospitalization, patient could complete all ADLs/ IADLs independently.  Patient will benefit from skilled intervention to increase independence with basic self-care skills and increase level of independence with iADL prior to discharge home with care partner.  Anticipate patient will require intermittent supervision and follow up home health.  OT - End of Session Endurance Deficit: Yes OT Assessment Rehab Potential (ACUTE ONLY): Excellent OT Patient demonstrates impairments in the following area(s): Balance;Motor;Endurance OT Basic ADL's Functional Problem(s): Bathing;Dressing;Toileting OT Advanced ADL's Functional Problem(s): Simple Meal Preparation;Light Housekeeping;Laundry OT Transfers Functional Problem(s): Toilet;Tub/Shower OT Additional Impairment(s): None OT Plan OT Intensity: Minimum of 1-2 x/day, 45 to 90 minutes OT Frequency: 5 out of 7 days OT Duration/Estimated Length of Stay: 6-9 days OT Treatment/Interventions: Discharge planning;Balance/vestibular training;DME/adaptive equipment instruction;Functional mobility training;Neuromuscular re-education;Self Care/advanced ADL retraining;Patient/family education;Therapeutic Exercise;Therapeutic Activities;UE/LE Strength taining/ROM OT Self Feeding Anticipated Outcome(s): Independent OT Basic Self-Care Anticipated Outcome(s): Mod I OT Toileting Anticipated Outcome(s): Mod I OT Bathroom Transfers Anticipated Outcome(s): Mod I OT Recommendation Patient destination: Home Follow Up Recommendations: Home health OT Equipment Recommended: Tub/shower bench   Skilled Therapeutic Intervention Pt seen for initial evaluation and ADL retraining with a focus on balance. Reviewed purpose/ goals of OT and expected POC. Pt sat to EOB independently, stood with S  and then needed steadying A to march in place and ambulate to bathroom and complete toilet and shower transfers. She was then able to toilet, bathe and dress all with close S. She has weakness in BUE with the R slightly weaker than the left but it did not impede her ability to complete self care.  Discussed all of her roles at home and strategies she can take at home getting help from her spouse, preteen grandkids and friends.  Pt participated well and demonstrated good safety awareness. Pt resting in bed for next session with all needs met.   OT Evaluation Precautions/Restrictions  Precautions Precautions: Fall Restrictions Weight Bearing Restrictions: No Pain Pain Assessment Pain Assessment: 0-10 Pain Score: 5  Pain Type: Acute pain Pain Location: Shoulder Pain Orientation: Right Pain Descriptors / Indicators: Aching Pain Frequency: Intermittent Pain Onset: Progressive Patients Stated Pain Goal: 3 Pain Intervention(s): Medication (See eMAR) Home Living/Prior Functioning Home Living Family/patient expects to be discharged to:: Private residence Living Arrangements: Spouse/significant other Available Help at Discharge: Family Type of Home: Mobile home Home Access: Stairs to enter Technical brewer of Steps: 4 Entrance Stairs-Rails: Can reach both Home Layout: One level Bathroom Shower/Tub: Chiropodist: Standard  Lives With: Spouse, Family Prior Function Level of Independence: Independent with basic ADLs, Independent with homemaking with ambulation, Independent with gait, Independent with transfers  Able to Take Stairs?: Yes Driving: No Comments: Performs ADLs, IADLs, and cares for grandchildren.  Pt reports that she has horses and rides them. ADL ADL ADL Comments: refer to functional navigator Vision Baseline Vision/History: Wears glasses Wears Glasses: Reading only Patient Visual Report: No change from baseline;Other (comment) Vision Assessment?:  Yes Eye Alignment: Within Functional Limits Ocular Range of Motion: Within Functional Limits Alignment/Gaze Preference: Within Defined Limits Tracking/Visual Pursuits: Able to track stimulus in all quads without difficulty Visual Fields: No apparent deficits Perception  Perception: Within Functional Limits Praxis Praxis: Intact Cognition Overall Cognitive Status: Within Functional Limits for tasks assessed Arousal/Alertness: Awake/alert Orientation Level: Person;Place;Situation Person: Oriented Place: Oriented Situation: Oriented Year: 2018 Month: August Day of Week: Correct Memory: Impaired Memory Impairment: Decreased short term memory Immediate Memory Recall: Sock;Blue;Bed Memory Recall: Sock;Blue;Bed Memory Recall Sock: Without Cue Memory Recall Blue: With Cue Memory Recall Bed: Without Cue Awareness: Appears intact Problem Solving: Appears intact Safety/Judgment: Appears intact Sensation Sensation Light Touch: Appears Intact Stereognosis: Appears Intact Hot/Cold: Appears Intact Proprioception: Appears Intact Coordination Gross Motor Movements are Fluid and Coordinated: Yes Fine Motor Movements are Fluid and Coordinated: Yes Motor  Motor Motor: Other (comment);Hemiplegia (very mild R sided weakness) Motor - Skilled Clinical Observations: generalized weakness; RUE/RLE slightly weaker Mobility    steadying A with ambulation Trunk/Postural Assessment  Cervical Assessment Cervical Assessment: Within Functional Limits Thoracic Assessment Thoracic Assessment: Within Functional Limits Lumbar Assessment Lumbar Assessment: Within Functional Limits  Balance Balance Balance Assessed: Yes Standardized Balance Assessment Standardized Balance Assessment: Berg Balance Test Berg Balance Test Sit to Stand: Able to stand  independently using hands Standing Unsupported: Able to stand 2 minutes with supervision Sitting with Back Unsupported but Feet Supported on Floor or  Stool: Able to sit safely and securely 2 minutes Stand to Sit: Controls descent by using hands Transfers: Able to transfer safely, definite need of hands Standing Unsupported with Eyes Closed: Able to stand 10 seconds with supervision Standing Ubsupported with Feet Together: Able to place feet together independently and stand for 1 minute with supervision From Standing, Reach Forward with Outstretched Arm: Can reach forward >12 cm safely (5") From Standing Position, Pick up Object from Floor: Able to pick up shoe, needs supervision From Standing Position, Turn to Look Behind Over each Shoulder: Looks behind one side only/other side shows less weight shift Turn 360 Degrees: Needs close supervision or verbal cueing Standing Unsupported, Alternately Place Feet on Step/Stool: Able to complete >2 steps/needs minimal assist Standing Unsupported, One Foot in Front: Loses balance while stepping or standing Standing on One Leg: Unable to try or needs assist to prevent fall Total Score: 33 Static Sitting Balance Static Sitting - Level of Assistance: 6: Modified independent (Device/Increase time) Dynamic Sitting Balance Dynamic Sitting - Level of Assistance: 5: Stand by assistance Static Standing Balance Static Standing - Level of Assistance: 5: Stand by assistance Dynamic Standing Balance Dynamic Standing - Level of Assistance: 4: Min assist Extremity/Trunk Assessment RUE Assessment RUE Assessment: Exceptions to [facility] RUE AROM (degrees) Right Shoulder Flexion: 100 Degrees RUE Strength RUE Overall Strength Comments: 3+/5 in shoulder, 4-/5 distally LUE Assessment LUE Assessment: Exceptions to [facility] LUE AROM (degrees) Left Shoulder Flexion: 120 Degrees LUE Strength LUE Overall Strength Comments: 3+/5 in shoulder, 4-/5 distally   See Function Navigator for Current Functional Status.   Refer to Care Plan for Long Term Goals  Recommendations for other services: None    Discharge Criteria:  Patient will be discharged from OT if patient refuses treatment 3 consecutive times without medical reason, if treatment goals not met, if there is a change in medical status, if patient makes no progress towards goals or if patient is discharged from hospital.  The above assessment, treatment plan, treatment alternatives and goals were discussed and mutually agreed upon: by family  [place] 10/12/2016, 12:06 PM

## 2016-10-12 NOTE — Progress Notes (Signed)
Subjective/Complaints:   Objective: Vital Signs: Blood pressure 130/68, pulse 65, temperature 98.7 F (37.1 C), temperature source Oral, resp. rate 18, height _0  (1.6 m), weight 94.3 kg (208 lb), SpO2 98 %. Ir Angiogram Extremity Left  Result Date: 10/11/2016 CLINICAL DATA:  Left-sided weakness with confusion. Right hemispheric watershed ischemic strokes. Abnormal MRI of the brain. EXAM: BILATERAL COMMON CAROTID AND INNOMINATE ANGIOGRAPHY; LEFT EXTREMITY ARTERIOGRAPHY; IR ANGIO VERTEBRAL SEL SUBCLAVIAN INNOMINATE UNI RIGHT MOD SED COMPARISON:  MRI of the brain of 10/08/2016, and CT angiogram of the brain of 10/09/2016. MEDICATIONS: Heparin 1500 units IV; no antibiotic was administered within 1 hour of the procedure. ANESTHESIA/SEDATION: Versed 0.5 mg IV; Fentanyl 50 mcg IV. Moderate Sedation Time:  28 minutes. The patient was continuously monitored during the procedure by the interventional radiology nurse under my direct supervision. CONTRAST:  Isovue 300 approximately 55 mL. FLUOROSCOPY TIME:  Fluoroscopy Time: 9 minutes 54 seconds (971 mGy). COMPLICATIONS: None immediate. TECHNIQUE: Informed written consent was obtained from the patient after a thorough discussion of the procedural risks, benefits and alternatives. All questions were addressed. Maximal Sterile Barrier Technique was utilized including caps, mask, sterile gowns, sterile gloves, sterile drape, hand hygiene and skin antiseptic. A timeout was performed prior to the initiation of the procedure. The right groin was prepped and draped in the usual sterile fashion. Thereafter using modified Seldinger technique, transfemoral access into the right common femoral artery was obtained without difficulty. Over a 0.035 inch guidewire, a 5 French Pinnacle sheath was inserted. Through this, and also over 0.035 inch guidewire, a 5 [place] JB 1 catheter was advanced to the aortic arch region and selectively positioned in the right common carotid  artery, , the right subclavian artery, the left common carotid artery and the left subclavian artery. FINDINGS: The right common carotid arteriogram demonstrates the right external carotid artery and its major branches to widely patent. The right internal carotid artery has a smooth shallow plaque along its medial wall without associated ulcerations or stenosis by the NASCET criteria. Distal to this the vessel is seen to opacify normally to the cranial skull base. The petrous, cavernous and supraclinoid segments are widely patent. Transient opacification via the right posterior communicating artery of the right posterior cerebral artery distribution is seen. The right middle cerebral artery and the proximal M1 segment demonstrates 70% focal stenosis due to a smooth circumferential plaque. Distal to this there is mild post stenotic dilatation. The right middle cerebral artery trifurcation branches are seen to opacify normally into the capillary and venous phases. There is approximately 50-70% stenoses of the anterior cerebral artery proximal A1 segment. Distal to this flow is noted into the right anterior cerebral artery into the pericallosal and the callosal marginal branches. In flow of non-opacified blood into the right anterior cerebral artery A2 segment is seen via the anterior communicating artery from the left internal carotid artery. The right vertebral artery origin demonstrates a 50% stenosis at its origin. Distal to this the vessel is seen to opacify normally to the cranial skull base. Normal opacification is seen of the right vertebrobasilar junction and the right posterior-inferior cerebellar artery. The basilar artery demonstrates approximately 30% stenosis in its mid basilar artery distal to the anterior-inferior cerebellar arteries, and of a 50% proximal to the termination into the posterior cerebral arteries. The posterior cerebral arteries, superior cerebellar arteries and the anterior-inferior  cerebellar arteries are seen to opacify into the capillary and venous phases. The left common carotid arteriogram demonstrates the left  external carotid artery and its major branches to be widely patent. There is a smooth shallow plaque along the anterior wall of the left common carotid artery just proximal to its bifurcation. The left internal carotid artery at the bulb is widely patent. There is mild smooth caliber irregularity of the left internal carotid artery just distal to the bulb which probably represents mild FMD-like changes. Distal to this the left internal carotid artery is seen to opacify to the cranial skull base. The petrous and the proximal cavernous segments are widely patent. There is approximately 65% stenosis of the left internal carotid artery, proximal supraclinoid segment. The ophthalmic artery is widely patent. The left middle cerebral artery demonstrates mild narrowing in its M1 segment. The left MCA trifurcation branches are widely patent into the capillary and venous phases. The left anterior cerebral artery A1 segment demonstrates approximately 50-70% stenoses. Distal to this the left anterior cerebral A2 segment demonstrates normal opacification into the capillary and venous phases. There is prompt opacification via the anterior communicating artery of the right anterior cerebral artery A2 segment and distally. Also demonstrated is a conical shaped outpouching in the region of the left posterior communicating artery most compatible with an infundibulum. The left subclavian arteriogram demonstrates a severe high-grade focal stenosis distal to its origin associated with filling defects and slow opacification of the left subclavian artery distally. 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 Approximately 50% stenosis of the right vertebral artery at its origin. Approximately 65% stenosis of the left internal carotid artery supraclinoid segment. Approximately 70% stenosis of the right middle cerebral artery M1 segment proximally. Approximately 50-70% stenoses of the anterior cerebral arteries A1 segments bilaterally. PLAN: Angiographic findings were reviewed with the patient. Electronically Signed   By: Luanne Bras M.D.   On: 10/10/2016 18:24   Ir Angio Intra Extracran Sel Com Carotid Innominate Bilat Mod Sed  Result Date: 10/11/2016 CLINICAL DATA:  Left-sided weakness with confusion. Right hemispheric watershed ischemic strokes. Abnormal MRI of the brain. EXAM: BILATERAL COMMON CAROTID AND INNOMINATE ANGIOGRAPHY; LEFT EXTREMITY ARTERIOGRAPHY; IR ANGIO VERTEBRAL SEL SUBCLAVIAN INNOMINATE UNI RIGHT MOD SED COMPARISON:  MRI of the brain of 10/08/2016, and CT angiogram of the brain of 10/09/2016. MEDICATIONS: Heparin 1500 units IV; no antibiotic was administered within 1 hour of the procedure. ANESTHESIA/SEDATION: Versed 0.5 mg IV; Fentanyl 50 mcg IV. Moderate Sedation Time:  28 minutes. The patient was continuously monitored during the procedure by the interventional radiology nurse under my direct supervision. CONTRAST:  Isovue 300 approximately 55 mL. FLUOROSCOPY TIME:  Fluoroscopy Time: 9 minutes 54 seconds (971 mGy). COMPLICATIONS: None immediate. TECHNIQUE: Informed written consent was obtained from the patient after a thorough discussion of the procedural risks, benefits and alternatives. All questions were addressed. Maximal Sterile Barrier Technique was utilized including caps, mask, sterile gowns, sterile gloves, sterile drape, hand hygiene and skin antiseptic. A timeout was performed prior to the initiation of the procedure. The right groin was prepped and draped in the usual sterile fashion. Thereafter using modified Seldinger technique, transfemoral access into  the right common femoral artery was obtained without difficulty. Over a 0.035 inch guidewire, a 5 French Pinnacle sheath was inserted. Through this, and also over 0.035 inch guidewire, a 5 [place] JB 1 catheter was advanced to the aortic arch region and  selectively positioned in the right common carotid artery, , the right subclavian artery, the left common carotid artery and the left subclavian artery. FINDINGS: The right common carotid arteriogram demonstrates the right external carotid artery and its major branches to widely patent. The right internal carotid artery has a smooth shallow plaque along its medial wall without associated ulcerations or stenosis by the NASCET criteria. Distal to this the vessel is seen to opacify normally to the cranial skull base. The petrous, cavernous and supraclinoid segments are widely patent. Transient opacification via the right posterior communicating artery of the right posterior cerebral artery distribution is seen. The right middle cerebral artery and the proximal M1 segment demonstrates 70% focal stenosis due to a smooth circumferential plaque. Distal to this there is mild post stenotic dilatation. The right middle cerebral artery trifurcation branches are seen to opacify normally into the capillary and venous phases. There is approximately 50-70% stenoses of the anterior cerebral artery proximal A1 segment. Distal to this flow is noted into the right anterior cerebral artery into the pericallosal and the callosal marginal branches. In flow of non-opacified blood into the right anterior cerebral artery A2 segment is seen via the anterior communicating artery from the left internal carotid artery. The right vertebral artery origin demonstrates a 50% stenosis at its origin. Distal to this the vessel is seen to opacify normally to the cranial skull base. Normal opacification is seen of the right vertebrobasilar junction and the right posterior-inferior cerebellar artery. The  basilar artery demonstrates approximately 30% stenosis in its mid basilar artery distal to the anterior-inferior cerebellar arteries, and of a 50% proximal to the termination into the posterior cerebral arteries. The posterior cerebral arteries, superior cerebellar arteries and the anterior-inferior cerebellar arteries are seen to opacify into the capillary and venous phases. The left common carotid arteriogram demonstrates the left external carotid artery and its major branches to be widely patent. There is a smooth shallow plaque along the anterior wall of the left common carotid artery just proximal to its bifurcation. The left internal carotid artery at the bulb is widely patent. There is mild smooth caliber irregularity of the left internal carotid artery just distal to the bulb which probably represents mild FMD-like changes. Distal to this the left internal carotid artery is seen to opacify to the cranial skull base. The petrous and the proximal cavernous segments are widely patent. There is approximately 65% stenosis of the left internal carotid artery, proximal supraclinoid segment. The ophthalmic artery is widely patent. The left middle cerebral artery demonstrates mild narrowing in its M1 segment. The left MCA trifurcation branches are widely patent into the capillary and venous phases. The left anterior cerebral artery A1 segment demonstrates approximately 50-70% stenoses. Distal to this the left anterior cerebral A2 segment demonstrates normal opacification into the capillary and venous phases. There is prompt opacification via the anterior communicating artery of the right anterior cerebral artery A2 segment and distally. Also demonstrated is a conical shaped outpouching in the region of the left posterior communicating artery most compatible with an infundibulum. The left subclavian arteriogram demonstrates a severe high-grade focal stenosis distal to its origin associated with filling defects and  slow opacification of the left subclavian artery distally. 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 Approximately 50% stenosis of the right vertebral artery  at its origin. Approximately 65% stenosis of the left internal carotid artery supraclinoid segment. Approximately 70% stenosis of the right middle cerebral artery M1 segment proximally. Approximately 50-70% stenoses of the anterior cerebral arteries A1 segments bilaterally. PLAN: Angiographic findings were reviewed with the patient. Electronically Signed   By: Luanne Bras M.D.   On: 10/10/2016 18:24   Ir Angio Vertebral Sel Subclavian Innominate Uni R Mod Sed  Result Date: 10/11/2016 CLINICAL DATA:  Left-sided weakness with confusion. Right hemispheric watershed ischemic strokes. Abnormal MRI of the brain. EXAM: BILATERAL COMMON CAROTID AND INNOMINATE ANGIOGRAPHY; LEFT EXTREMITY ARTERIOGRAPHY; IR ANGIO VERTEBRAL SEL SUBCLAVIAN INNOMINATE UNI RIGHT MOD SED COMPARISON:  MRI of the brain of 10/08/2016, and CT angiogram of the brain of 10/09/2016. MEDICATIONS: Heparin 1500 units IV; no antibiotic was administered within 1 hour of the procedure. ANESTHESIA/SEDATION: Versed 0.5 mg IV; Fentanyl 50 mcg IV. Moderate Sedation Time:  28 minutes. The patient was continuously monitored during the procedure by the interventional radiology nurse under my direct supervision. CONTRAST:  Isovue 300 approximately 55 mL. FLUOROSCOPY TIME:  Fluoroscopy Time: 9 minutes 54 seconds (971 mGy). COMPLICATIONS: None immediate. TECHNIQUE: Informed written consent was obtained from the patient after a thorough discussion of the procedural risks, benefits and alternatives. All questions were addressed. Maximal Sterile Barrier Technique was utilized including  caps, mask, sterile gowns, sterile gloves, sterile drape, hand hygiene and skin antiseptic. A timeout was performed prior to the initiation of the procedure. The right groin was prepped and draped in the usual sterile fashion. Thereafter using modified Seldinger technique, transfemoral access into the right common femoral artery was obtained without difficulty. Over a 0.035 inch guidewire, a 5 French Pinnacle sheath was inserted. Through this, and also over 0.035 inch guidewire, a 5 [place] JB 1 catheter was advanced to the aortic arch region and selectively positioned in the right common carotid artery, , the right subclavian artery, the left common carotid artery and the left subclavian artery. FINDINGS: The right common carotid arteriogram demonstrates the right external carotid artery and its major branches to widely patent. The right internal carotid artery has a smooth shallow plaque along its medial wall without associated ulcerations or stenosis by the NASCET criteria. Distal to this the vessel is seen to opacify normally to the cranial skull base. The petrous, cavernous and supraclinoid segments are widely patent. Transient opacification via the right posterior communicating artery of the right posterior cerebral artery distribution is seen. The right middle cerebral artery and the proximal M1 segment demonstrates 70% focal stenosis due to a smooth circumferential plaque. Distal to this there is mild post stenotic dilatation. The right middle cerebral artery trifurcation branches are seen to opacify normally into the capillary and venous phases. There is approximately 50-70% stenoses of the anterior cerebral artery proximal A1 segment. Distal to this flow is noted into the right anterior cerebral artery into the pericallosal and the callosal marginal branches. In flow of non-opacified blood into the right anterior cerebral artery A2 segment is seen via the anterior communicating artery from the left internal  carotid artery. The right vertebral artery origin demonstrates a 50% stenosis at its origin. Distal to this the vessel is seen to opacify normally to the cranial skull base. Normal opacification is seen of the right vertebrobasilar junction and the right posterior-inferior cerebellar artery. The basilar artery demonstrates approximately 30% stenosis in its mid basilar artery distal to the anterior-inferior cerebellar arteries, and of a 50% proximal to the termination into the posterior cerebral  arteries. The posterior cerebral arteries, superior cerebellar arteries and the anterior-inferior cerebellar arteries are seen to opacify into the capillary and venous phases. The left common carotid arteriogram demonstrates the left external carotid artery and its major branches to be widely patent. There is a smooth shallow plaque along the anterior wall of the left common carotid artery just proximal to its bifurcation. The left internal carotid artery at the bulb is widely patent. There is mild smooth caliber irregularity of the left internal carotid artery just distal to the bulb which probably represents mild FMD-like changes. Distal to this the left internal carotid artery is seen to opacify to the cranial skull base. The petrous and the proximal cavernous segments are widely patent. There is approximately 65% stenosis of the left internal carotid artery, proximal supraclinoid segment. The ophthalmic artery is widely patent. The left middle cerebral artery demonstrates mild narrowing in its M1 segment. The left MCA trifurcation branches are widely patent into the capillary and venous phases. The left anterior cerebral artery A1 segment demonstrates approximately 50-70% stenoses. Distal to this the left anterior cerebral A2 segment demonstrates normal opacification into the capillary and venous phases. There is prompt opacification via the anterior communicating artery of the right anterior cerebral artery A2 segment  and distally. Also demonstrated is a conical shaped outpouching in the region of the left posterior communicating artery most compatible with an infundibulum. The left subclavian arteriogram demonstrates a severe high-grade focal stenosis distal to its origin associated with filling defects and slow opacification of the left subclavian artery distally. 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 Approximately 50% stenosis of the right vertebral artery at its origin. Approximately 65% stenosis of the left internal carotid artery supraclinoid segment. Approximately 70% stenosis of the right middle cerebral artery M1 segment proximally. Approximately 50-70% stenoses of the anterior cerebral arteries A1 segments bilaterally. PLAN: Angiographic findings were reviewed with the patient. Electronically Signed   By: Luanne Bras M.D.   On: 10/10/2016 18:24   Results for orders placed or performed during the hospital encounter of 10/11/16 (from the past 72 hour(s))  CBC     Status: Abnormal   Collection Time: 10/11/16  6:11 PM  Result Value Ref Range   WBC 8.2 4.0 - 10.5 K/uL   RBC 3.80 (L) 3.87 - 5.11 MIL/uL   Hemoglobin 11.4 (L) 12.0 - 15.0 g/dL   HCT 34.6 (L) 36.0 - 46.0 %   MCV 91.1 78.0 - 100.0 fL   MCH 30.0 26.0 - 34.0 pg   MCHC 32.9 30.0 - 36.0 g/dL   RDW 14.5 11.5 - 15.5 %   Platelets 236 150 - 400 K/uL  Creatinine, serum     Status: None   Collection Time: 10/11/16  6:11 PM  Result Value Ref Range   Creatinine, Ser 0.76 0.44 - 1.00 mg/dL   GFR calc non Af Amer >60 >60 mL/min   GFR calc Af Amer >60 >60 mL/min    Comment: (NOTE) The eGFR has been calculated using the CKD EPI equation. This calculation has not been validated in all clinical situations. eGFR's persistently  <60 mL/min signify possible Chronic Kidney Disease.   Glucose, capillary     Status: None   Collection Time: 10/11/16  9:27 PM  Result Value Ref Range   Glucose-Capillary  98 65 - 99 mg/dL  CBC WITH DIFFERENTIAL     Status: Abnormal   Collection Time: 10/12/16  5:19 AM  Result Value Ref Range   WBC 6.3 4.0 - 10.5 K/uL   RBC 3.76 (L) 3.87 - 5.11 MIL/uL   Hemoglobin 11.3 (L) 12.0 - 15.0 g/dL   HCT 34.7 (L) 36.0 - 46.0 %   MCV 92.3 78.0 - 100.0 fL   MCH 30.1 26.0 - 34.0 pg   MCHC 32.6 30.0 - 36.0 g/dL   RDW 14.9 11.5 - 15.5 %   Platelets 225 150 - 400 K/uL   Neutrophils Relative % 61 %   Neutro Abs 3.9 1.7 - 7.7 K/uL   Lymphocytes Relative 27 %   Lymphs Abs 1.7 0.7 - 4.0 K/uL   Monocytes Relative 7 %   Monocytes Absolute 0.4 0.1 - 1.0 K/uL   Eosinophils Relative 4 %   Eosinophils Absolute 0.2 0.0 - 0.7 K/uL   Basophils Relative 1 %   Basophils Absolute 0.0 0.0 - 0.1 K/uL  Comprehensive metabolic panel     Status: Abnormal   Collection Time: 10/12/16  5:19 AM  Result Value Ref Range   Sodium 138 135 - 145 mmol/L   Potassium 3.2 (L) 3.5 - 5.1 mmol/L   Chloride 103 101 - 111 mmol/L   CO2 29 22 - 32 mmol/L   Glucose, Bld 117 (H) 65 - 99 mg/dL   BUN <5 (L) 6 - 20 mg/dL   Creatinine, Ser 0.63 0.44 - 1.00 mg/dL   Calcium 8.6 (L) 8.9 - 10.3 mg/dL   Total Protein 5.9 (L) 6.5 - 8.1 g/dL   Albumin 2.7 (L) 3.5 - 5.0 g/dL   AST 31 15 - 41 U/L   ALT 19 14 - 54 U/L   Alkaline Phosphatase 58 38 - 126 U/L   Total Bilirubin 0.4 0.3 - 1.2 mg/dL   GFR calc non Af Amer >60 >60 mL/min   GFR calc Af Amer >60 >60 mL/min    Comment: (NOTE) The eGFR has been calculated using the CKD EPI equation. This calculation has not been validated in all clinical situations. eGFR's persistently <60 mL/min signify possible Chronic Kidney Disease.    Anion gap 6 5 - 15  Glucose, capillary     Status: Abnormal   Collection Time: 10/12/16  6:52 AM  Result Value Ref Range   Glucose-Capillary 132 (H) 65 -  99 mg/dL     HEENT: normal Cardio: RRR and no murmur Resp: CTA B/L and unlabored GI: BS positive and NT, ND Extremity:  Pulses positive and No Edema Skin:   Intact Neuro: Alert/Oriented, Normal Sensory, Abnormal Motor 4- Right delt , bi, tri, grip, HF, KE, ADF, 4/5 in left delt bi tri grip HF KE ADF and Abnormal FMC Ataxic/ dec FMC Musc/Skel:  Normal Gen NAD   Assessment/Plan: 1. Functional deficits secondary to bilateral frontoparietal infarcts which require 3+ hours per day of interdisciplinary therapy in a comprehensive inpatient rehab setting. Physiatrist is providing close team supervision and 24 hour management of active medical problems listed below. Physiatrist and rehab team continue to assess barriers to discharge/monitor patient progress toward functional and medical goals. FIM:                   Function - Comprehension Comprehension: Auditory Comprehension assist level: Follows basic conversation/direction with no assist  Function - Expression Expression: Verbal Expression assist level: Expresses basic needs/ideas: With no assist  Function - Social  Interaction Social Interaction assist level: Interacts appropriately 75 - 89% of the time - Needs redirection for appropriate language or to initiate interaction.  Function - Problem Solving Problem solving assist level: Solves basic 75 - 89% of the time/requires cueing 10 - 24% of the time  Function - Memory Memory assist level: Recognizes or recalls 75 - 89% of the time/requires cueing 10 - 24% of the time Patient normally able to recall (first 3 days only): Current season, That he or she is in a hospital  Medical Problem List and Plan: 1. 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 DVT Prophylaxis/Anticoagulation: Lovenox 40 mg daily. Monitor for any bleeding episodes 3. Pain Management/chronic back pain: OxyContin  sustained release 10 mg every 12 hours, oxycodone immediate release as needed. Monitor mental status with medication 4. Mood: Xanax 1 mg twice a day as needed 5. Neuropsych: This patient iscapable of making decisions on herown behalf. 6. Skin/Wound Care: Routine skin checks 7. 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 Follow-up outpatientinterventional radiology for possible elective angioplasty versus stenting 9.Hypertension. No current antihypertensive medication. Monitor with increased mobility.Patient on Toprol 50 mg daily, Hyzaar 100-12 0.5 mg daily, Norvasc 5 mg daily prior to admission.Controlled 8/22  Resume as needed Vitals:   10/12/16 0100 10/12/16 0451  BP: 135/64 130/68  Pulse: 69 65  Resp: 18 18  Temp: 98.4 F (36.9 C) 98.7 F (37.1 C)  SpO2: 97% 98%   10.Diabetes mellitus with peripheral neuropathy. Hemoglobin A1c 6.5.SSI.Check blood sugars before meals and at bedtime. Diabetic teaching. Patient on Glucophage 500 mg twice a day prior to admission CBG (last 3)   Recent Labs  10/11/16 1642 10/11/16 2127 10/12/16 0652  GLUCAP 104* 98 132*    11.Hyperlipidemia. Lipitor 12.Tobacco abuse. Counseling 13.History of breast cancer. Currently in remission.  LOS (Days) 1 A FACE TO FACE EVALUATION WAS PERFORMED  [name] E 10/12/2016, 9:47 AM

## 2016-10-12 NOTE — Care Management Note (Signed)
Inpatient [facility] Individual Statement of Services  Patient Name:  [name]  Date:  10/12/2016  Welcome to the [facility].  Our goal is to provide you with an individualized program based on your diagnosis and situation, designed to meet your specific needs.  With this comprehensive rehabilitation program, you will be expected to participate in at least 3 hours of rehabilitation therapies Monday-Friday, with modified therapy programming on the weekends.  Your rehabilitation program will include the following services:  Physical Therapy (PT), Occupational Therapy (OT), Speech Therapy (ST), 24 hour per day rehabilitation nursing, Neuropsychology, Case Management (Social Worker), Rehabilitation Medicine, Nutrition Services and Pharmacy Services  Weekly team conferences will be held on Wednesday to discuss your progress.  Your Social Worker will talk with you frequently to get your input and to update you on team discussions.  Team conferences with you and your family in attendance may also be held.  Expected length of stay: 6-9 days Overall anticipated outcome: mod/i level  Depending on your progress and recovery, your program may change. Your Social Worker will coordinate services and will keep you informed of any changes. Your Social Worker's name and contact numbers are listed  below.  The following services may also be recommended but are not provided by the [facility]:    [place] will be made to provide these services after discharge if needed.  Arrangements include referral to agencies that provide these services.  Your insurance has been verified to be:  Medicaid Your primary doctor is:  Selina Cooley  Pertinent information will be shared with your doctor and your insurance company.  Social Worker:  Ovidio Kin, [place] or  [phone]  Information discussed with and copy given to patient by: Elease Hashimoto, 10/12/2016, 2:01 PM

## 2016-10-12 NOTE — Evaluation (Signed)
Speech Language Pathology Assessment and Plan  Patient Details  Name: [name] MRN: [id number] Date of Birth: 1960/08/05    Evaluation only  Today's Date: 10/12/2016 SLP Individual Time: 1300-1400 SLP Individual Time Calculation (min): 60 min   Problem List:  Patient Active Problem List   Diagnosis Date Noted  . Acute bilat watershed infarction [facility]) 10/11/2016  . 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 Cerebral embolism with cerebral infarction 10/09/2016  . CVA (cerebral vascular accident) ([place]) 10/09/2016  . Chronic pain syndrome 10/09/2016  . Diabetes mellitus type 2 in obese ([place]) 10/09/2016  . AKI (acute kidney injury) ([place])   . Right-sided muscle weakness   . Eye drainage 02/28/2014  . Conjunctivitis 02/28/2014  . Back pain 02/28/2014  . Hyperlipidemia 12/07/2012  . Tobacco abuse 12/07/2012  . Psoriasis 12/13/2010  . COPD (chronic obstructive pulmonary disease) ([place]) 09/14/2010  . 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 H/O MRSA infection 09/14/2010  . Anxiety state 05/13/2009  . DEPRESSION 05/13/2009  . Essential hypertension 05/13/2009  . H/O Stage III Breast cancer, left 05/13/2009   Past Medical History:  Past Medical History:  Diagnosis Date  . Asthma   . Back muscle spasm 12/13/2010  . Cancer [facility])    breast cancer, remission 2010  . Chronic back pain   . Chronic foot pain   . COPD (chronic obstructive pulmonary disease) ([place]) 09/14/2010  . H/O MRSA infection 09/14/2010  . High blood cholesterol   . Hyperlipidemia 12/07/2012  . Hypertension   . 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 Psoriasis 12/13/2010  . Tobacco abuse 12/07/2012   Past Surgical History:  Past Surgical History:  Procedure Laterality Date  . ABDOMINAL HYSTERECTOMY    . CESAREAN SECTION     x7  . IR ANGIO INTRA EXTRACRAN SEL COM CAROTID INNOMINATE BILAT MOD SED  10/10/2016  . IR ANGIO VERTEBRAL SEL SUBCLAVIAN INNOMINATE UNI R MOD SED  10/10/2016  . IR ANGIOGRAM  EXTREMITY LEFT  10/10/2016  . MASTECTOMY     left  . MASTECTOMY    . PORT-A-CATH REMOVAL  06/01/2011   Procedure: REMOVAL PORT-A-CATH;  Surgeon: Donato Heinz, MD;  Location: [facility];  Service: General;  Laterality: N/A;  Minor Room  . portacath insertion      Assessment / Plan / Recommendation Clinical Impression [name] a 56 y.o.right handed femalewith history of hypertension, diabetes mellitus, breast cancer in remission, chronic back pain, tobacco abuse. Presented 10/09/2016 with right side weakness, slurred speechas well as recent fall and had been essentially bedbound for the past week or so. Per chart review patient lives with boyfriend. One level home for steps to entry. Independent prior to admission. Cranial CT scan showed no evidence of acute infarction. Blood pressure 88/40. Urine drug screen positive for opiates and benzos. MRI of the brain showed numerous acute small frontoparietal infarcts including the small vessel and MCA territories. MRI cervical spine without neurocompression. CT angiogram of head and neck showed no emergent large vessel occlusion. 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 Lower extremity Dopplers negative. Patient did not receive TPA. Cerebral angiogram 10/10/2016 per interventional radiology showed approximately 70% stenosis right MCA proximal M1 segment. Approximately 50% stenosis of right VA origin. Echocardiogram with ejection fraction 65% no wall motion abnormalities. Presently on aspirin and Plavixfor CVA prophylaxis. Subcutaneous Lovenox for DVT prophylaxis. Physical and occupational therapy evaluations completed with recommendations of physical medicine rehabilitation consult.Patient was  admitted for a comprehensive rehabilitation program on 10/11/16. Comprehensive cognitive linguistic evaluation completed on 10/12/16. Pt presents with 3rd grade education and is a nonreader. Pt obtained  a score of 28 out of 30 on the [place] with points given for least 4 year edcuation and being illiterate. Pt with ability to demonstrate basic problem solving, selective attention and awareness. Pt with some personality differences and states that she doesn't want to be told what to do by nursing despite knowledge that nursing is helping her. Skilled ST doesn't appear indicated at this time as pt is at baseline level of functioning. All education completed and no further services indicated.   Skilled Therapeutic Interventions          Skilled treatment session focused on completion of cognitive linguistic evaluations, see above. Pt demonstrates basic level of cognition and is at baseline. No further services are indicated.    SLP Assessment  Patient does not need any further Speech [place] Pathology Services    Recommendations  Patient destination: Home Follow up Recommendations: None Equipment Recommended: None recommended by SLP           Pain    Prior Functioning Cognitive/Linguistic Baseline: Within functional limits Type of Home: Mobile home  Lives With: Spouse;Family Available Help at Discharge: Family Education: 3rd grade education - nonreader  Function:  Eating Eating   Modified Consistency Diet: No Eating Assist Level: No help, No cues           Cognition Comprehension Comprehension assist level: Follows basic conversation/direction with no assist  Expression   Expression assist level: Expresses basic needs/ideas: With no assist  Social Interaction Social Interaction assist level: Interacts appropriately with others with medication or extra time (anti-anxiety, antidepressant).  Problem Solving Problem solving assist level: Solves basic problems with no assist  Memory Memory assist level: More than reasonable amount of time   Short Term Goals: No short term goals set  Refer to Care Plan for Long Term Goals  Recommendations for other services: None    Discharge Criteria: Patient will be discharged from SLP if patient refuses treatment 3 consecutive times without medical reason, if treatment goals not met, if there is a change in medical status, if patient makes no progress towards goals or if patient is discharged from hospital.  The above assessment, treatment plan, treatment alternatives and goals were discussed and mutually agreed upon: by patient  Mattia Osterman 10/12/2016, 3:03 PM

## 2016-10-12 NOTE — Patient Care Conference (Signed)
Inpatient RehabilitationTeam Conference and Plan of Care Update Date: 10/12/2016   Time: 10:55 AM    Patient Name: [name]      Medical Record Number: [id number]  Date of Birth: 02-Sep-1960 Sex: Female         Room/Bed: 4W18C/4W18C-01 Payor Info: Payor: MEDICAID [place] / Plan: MEDICAID [place] ACCESS / Product Type: *No Product type* /    Admitting Diagnosis: B CVA  Admit Date/Time:  10/11/2016  5:58 PM Admission Comments: No comment available   Primary Diagnosis:  Acute bilat watershed infarction [facility]) Principal Problem: Acute bilat watershed infarction [facility])  Patient Active Problem List   Diagnosis Date Noted  . Acute bilat watershed infarction [facility] 10/11/2016  . 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 Cerebral embolism with cerebral infarction 10/09/2016  . CVA (cerebral vascular accident) ([place]) 10/09/2016  . Chronic pain syndrome 10/09/2016  . Diabetes mellitus type 2 in obese ([place]) 10/09/2016  . AKI (acute kidney injury) ([place])   . Right-sided muscle weakness   . Eye drainage 02/28/2014  . Conjunctivitis 02/28/2014  . Back pain 02/28/2014  . Hyperlipidemia 12/07/2012  . Tobacco abuse 12/07/2012  . Psoriasis 12/13/2010  . COPD (chronic obstructive pulmonary disease) ([place]) 09/14/2010  . 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 H/O MRSA infection 09/14/2010  . Anxiety state 05/13/2009  . DEPRESSION 05/13/2009  . Essential hypertension 05/13/2009  . H/O Stage III Breast cancer, left 05/13/2009    Expected Discharge Date:    Team Members Present: Physician leading conference: Dr. Alysia Penna Social Worker Present: Ovidio Kin, LCSW Nurse Present: Elliot Cousin, RN PT Present: Barrie Folk, PT;Rosita Dechalus, PTA OT Present: Napoleon Form, OT SLP Present: Stormy Fabian, SLP PPS Coordinator present : Daiva Nakayama, RN, CRRN     Current Status/Progress Goal Weekly Team Focus  Medical        medical stability     Bowel/Bladder        cont B & B      Swallow/Nutrition/ Hydration        WFL     ADL's     new eval        Mobility   min assist overall  mod I to supervision overall  balance, endurance, gait, strengthening, d/c planning   Communication             Safety/Cognition/ Behavioral Observations            Pain        less than 3     Skin        monitor no issues currently        *See Care Plan and progress notes for long and short-term goals.     Barriers to Discharge  Current Status/Progress Possible Resolutions Date Resolved   Physician                    Nursing                  PT                    OT                  SLP                SW Lack of/limited family support;Medication compliance Does not have 24 hr care and has substance abuse issues            Discharge  Planning/Teaching Needs:    Home with Juan-boyfriend who is gone working during the day at times he works long hours. Pt needs to be mod/i to return home alone safely.     Team Discussion:  New eval short length of stay due to high level. Very fortuante doing be doing so well after refusing to come for one week to the hospital.  Revisions to Treatment Plan:  New eval       [name], [place] 10/12/2016, 3:08 PM

## 2016-10-12 NOTE — Progress Notes (Signed)
Social Work Assessment and Plan Social Work Assessment and Plan  Patient Details  Name: [name] MRN: [id number] Date of Birth: 1960-12-18  Today's Date: 10/12/2016  Problem List:  Patient Active Problem List   Diagnosis Date Noted  . Acute bilat watershed infarction [facility]) 10/11/2016  . 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 Cerebral embolism with cerebral infarction 10/09/2016  . CVA (cerebral vascular accident) ([place]) 10/09/2016  . Chronic pain syndrome 10/09/2016  . Diabetes mellitus type 2 in obese ([place]) 10/09/2016  . AKI (acute kidney injury) ([place])   . Right-sided muscle weakness   . Eye drainage 02/28/2014  . Conjunctivitis 02/28/2014  . Back pain 02/28/2014  . Hyperlipidemia 12/07/2012  . Tobacco abuse 12/07/2012  . Psoriasis 12/13/2010  . COPD (chronic obstructive pulmonary disease) ([place]) 09/14/2010  . 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 H/O MRSA infection 09/14/2010  . Anxiety state 05/13/2009  . DEPRESSION 05/13/2009  . Essential hypertension 05/13/2009  . H/O Stage III Breast cancer, left 05/13/2009   Past Medical History:  Past Medical History:  Diagnosis Date  . Asthma   . Back muscle spasm 12/13/2010  . Cancer [facility])    breast cancer, remission 2010  . Chronic back pain   . Chronic foot pain   . COPD (chronic obstructive pulmonary disease) ([place]) 09/14/2010  . H/O MRSA infection 09/14/2010  . High blood cholesterol   . Hyperlipidemia 12/07/2012  . Hypertension   . 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 Psoriasis 12/13/2010  . Tobacco abuse 12/07/2012   Past Surgical History:  Past Surgical History:  Procedure Laterality Date  . ABDOMINAL HYSTERECTOMY    . CESAREAN SECTION     x7  . IR ANGIO INTRA EXTRACRAN SEL COM CAROTID INNOMINATE BILAT MOD SED  10/10/2016  . IR ANGIO VERTEBRAL SEL SUBCLAVIAN INNOMINATE UNI R MOD SED  10/10/2016  . IR ANGIOGRAM EXTREMITY LEFT  10/10/2016  . MASTECTOMY     left  . MASTECTOMY    . PORT-A-CATH  REMOVAL  06/01/2011   Procedure: REMOVAL PORT-A-CATH;  Surgeon: Donato Heinz, MD;  Location: [facility];  Service: General;  Laterality: N/A;  Minor Room  . portacath insertion     Social History:  reports that she has been smoking.  She has a 9.00 pack-year smoking history. She has never used smokeless tobacco. She reports that she drinks alcohol. She reports that she uses drugs, including Marijuana.  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 She is one who like to be independent and doesn't ask others for help. She is usually the one who is helping others. She realizes she was stubborn and should have come to the hospital sooner than she did.  Social History Preferred language: English Religion: Baptist Cultural Background: No issues Education: [employer] Read: Yes Write: Yes Employment Status: Disabled Freight forwarder Issues: No issues Guardian/Conservator: None-according to MD pt is capable of making her own decisions while here.    Abuse/Neglect Physical Abuse: Denies Verbal Abuse: Denies Sexual Abuse: Denies Exploitation of patient/patient's resources: Denies Self-Neglect: Denies  Emotional Status Pt's affect, behavior adn adjustment  status: Pt voiced she is glad to be doing so well. She realizes it could have been worse as long as she waited to come to the hospital. She is stubborn and not one to ask for help. She plans to be independent when she leaves here. She is moving well  already in therapies. Recent Psychosocial Issues: other health issues unsure if follows up with MD will confirm Pyschiatric History: History of anxiety takes medicines, feels they help her. Would benefit from seeing neuro-psych while here due to anxiety and substance abuse issues. She denies them and feels she is doing great and talks about her faith in [place]. Substance Abuse History: Positive for opiates and benzos-pt denies abuse and feels she was sick and didn't know what she had taken. Will continue to discuss and make aware services in the community for her.  Patient / Family Perceptions, Expectations & Goals Pt/Family understanding of illness & functional limitations: Pt can explain her strokes and she is so glad she is doing well and moving well. She has spoken with the MD and feels she is on board with following their recommendations. She does not want to have anther stroke so she will do what they say to do. Premorbid pt/family roles/activities: Mom, grandmother, girlfriend, friend, sibling, retiree, etc Anticipated changes in roles/activities/participation: resume Pt/family expectations/goals: Pt states: " I need to tak care of myself, Elita Quick is gone working during the day."  Mimi states: " I hope she does well here, she has too."  [employer]: None Premorbid Home Care/DME Agencies: None Transportation available at discharge: Friends and brother Resource referrals recommended: Neuropsychology, Support group (specify)  Discharge Planning Living Arrangements: Spouse/significant other Support Systems: Spouse/significant other, Children, Other relatives, Friends/neighbors Type of Residence: Private residence Insurance underwriter Resources: [employer] (specify county) Pensions consultant: SSI, Family Support Financial Screen Referred: Previously completed Living Expenses: Education officer, community Management: Patient, Significant Other Does the patient have any problems obtaining your  medications?: No Home Management: Patient does the home management Patient/Family Preliminary Plans: Return home with [place], who works during the day at times he works long hours. Pt will need to be mod/i to be able to go home safely and stay alone while he is working. Will await team evaluations and work with her on her discharge needs. Will see if someone can check on her while Elita Quick is working. [place] Barriers to Discharge: Lack of/limited family support, Medication compliance Sw Barriers to Discharge Comments: Does not have 24 hr care and has substance abuse issues Social Work Anticipated Follow Up Needs: HH/OP, Support Group  Clinical Impression Very fortunate patient who after one week of laying in bed after suffering a stroke, she came to the hospital. She is doing well and moving well in therapies. She will need to reach mod/i level to be able to return home safely with boyfriend-Juan who works longs hours in the tobacco fields. She does not acknowledge a substance abuse issue, is aware she was positive on admission. Will have neuro-psych see while here to address. Will work on a safe discharge plan for pt.  Elease Hashimoto 10/12/2016, 1:58 PM

## 2016-10-12 NOTE — Progress Notes (Signed)
Recreational Therapy Session Note  Patient Details  Name: [name] MRN: [id number] Date of Birth: [birth year] Today's Date: 10/12/2016   Pt participated in animal assisted activity/therapy seated with supervision.  [place] 10/12/2016, 3:49 PM

## 2016-10-12 NOTE — Evaluation (Signed)
Physical Therapy Assessment and Plan  Patient Details  Name: SHAMIR SEDLAR MRN: [id number] Date of Birth: 1960/06/01  PT Diagnosis: Difficulty walking, Hemiparesis dominant and Muscle weakness Rehab Potential: Good ELOS: 6-9 days   Today's Date: 10/12/2016 PT Individual Time: 1000-1115 PT Individual Time Calculation (min): 75 min    Problem List:  Patient Active Problem List   Diagnosis Date Noted  . Acute bilat watershed infarction [facility]) 10/11/2016  . 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 Cerebral embolism with cerebral infarction 10/09/2016  . CVA (cerebral vascular accident) ([place]) 10/09/2016  . Chronic pain syndrome 10/09/2016  . Diabetes mellitus type 2 in obese ([place]) 10/09/2016  . AKI (acute kidney injury) ([place])   . Right-sided muscle weakness   . Eye drainage 02/28/2014  . Conjunctivitis 02/28/2014  . Back pain 02/28/2014  . Hyperlipidemia 12/07/2012  . Tobacco abuse 12/07/2012  . Psoriasis 12/13/2010  . COPD (chronic obstructive pulmonary disease) ([place]) 09/14/2010  . 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 H/O MRSA infection 09/14/2010  . Anxiety state 05/13/2009  . DEPRESSION 05/13/2009  . Essential hypertension 05/13/2009  . H/O Stage III Breast cancer, left 05/13/2009    Past Medical History:  Past Medical History:  Diagnosis Date  . Asthma   . Back muscle spasm 12/13/2010  . Cancer [facility])    breast cancer, remission 2010  . Chronic back pain   . Chronic foot pain   . COPD (chronic obstructive pulmonary disease) ([place]) 09/14/2010  . H/O MRSA infection 09/14/2010  . High blood cholesterol   . Hyperlipidemia 12/07/2012  . Hypertension   . 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 Psoriasis 12/13/2010  . Tobacco abuse 12/07/2012   Past Surgical History:  Past Surgical History:  Procedure Laterality Date  . ABDOMINAL HYSTERECTOMY    . CESAREAN SECTION     x7  . IR ANGIO INTRA EXTRACRAN SEL COM CAROTID INNOMINATE BILAT MOD SED  10/10/2016  .  IR ANGIO VERTEBRAL SEL SUBCLAVIAN INNOMINATE UNI R MOD SED  10/10/2016  . IR ANGIOGRAM EXTREMITY LEFT  10/10/2016  . MASTECTOMY     left  . MASTECTOMY    . PORT-A-CATH REMOVAL  06/01/2011   Procedure: REMOVAL PORT-A-CATH;  Surgeon: Donato Heinz, MD;  Location: [facility];  Service: General;  Laterality: N/A;  Minor Room  . portacath insertion      Assessment & Plan Clinical Impression: Patient is a 56 y.o.right handed femalewith history of hypertension, diabetes mellitus, breast cancer in remission, chronic back pain, tobacco abuse. Presented 10/09/2016 with right side weakness, slurred speechas well as recent fall and had been essentially bedbound for the past week or so. Per chart review patient lives with boyfriend. One level home for steps to entry. Independent prior to admission. Cranial CT scan showed no evidence of acute infarction. Blood pressure 88/40. Urine drug screen positive for opiates and benzos. MRI of the brain showed numerous acute small frontoparietal infarcts including the small vessel and MCA territories. MRI cervical spine without neurocompression. CT angiogram of head and neck showed no emergent large vessel occlusion. 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 Lower extremity Dopplers negative. Patient did not receive TPA. Cerebral angiogram 10/10/2016 per interventional radiology showed approximately 70% stenosis right MCA proximal M1 segment. Approximately 50% stenosis of right VA origin. Echocardiogram with ejection fraction 65% no wall motion abnormalities. Presently on aspirin and Plavixfor CVA prophylaxis. Subcutaneous Lovenox for DVT prophylaxis. Physical and occupational therapy  evaluations completed with recommendations of physical medicine rehabilitation consult.  Patient transferred to CIR on 10/11/2016 .   Patient currently requires min with mobility secondary to muscle weakness and muscle joint tightness,  decreased cardiorespiratoy endurance, decreased memory and decreased sitting balance, decreased standing balance and decreased balance strategies.  Prior to hospitalization, patient was independent  with mobility and lived with Spouse, Family in a Mobile home home.  Home access is 4Stairs to enter.  Patient will benefit from skilled PT intervention to maximize safe functional mobility, minimize fall risk and decrease caregiver burden for planned discharge home with intermittent assist.  Pt reports her church members and brother can assist with care of grandkids upon discharge. Anticipate patient will benefit from follow up [place] at discharge.  PT - End of Session Endurance Deficit: Yes PT Assessment Rehab Potential (ACUTE/IP ONLY): Good PT Patient demonstrates impairments in the following area(s): Balance;Endurance;Motor;Safety;Pain PT Transfers Functional Problem(s): Bed Mobility;Bed to Chair;Car;Furniture;Floor PT Locomotion Functional Problem(s): Ambulation;Stairs PT Plan PT Intensity: Minimum of 1-2 x/day ,45 to 90 minutes PT Frequency: 5 out of 7 days PT Duration Estimated Length of Stay: 6-9 days PT Treatment/Interventions: Ambulation/gait training;Balance/vestibular training;Community reintegration;Discharge planning;Disease management/prevention;DME/adaptive equipment instruction;Functional mobility training;Neuromuscular re-education;Pain management;Patient/family education;Psychosocial support;Stair training;Therapeutic Activities;Therapeutic Exercise;UE/LE Strength taining/ROM;UE/LE Coordination activities;Wheelchair propulsion/positioning PT Transfers Anticipated Outcome(s): mod I basic transfers PT Locomotion Anticipated Outcome(s): mod I household gait; supervision stairs and community mobility PT Recommendation Recommendations for Other Services: Neuropsych consult (coping?) Follow Up Recommendations: Home health PT Patient destination: Home Equipment Recommended: To be determined  (SPC vs QC)  Skilled Therapeutic Intervention Evaluation completed (see details above and below) with education on PT POC and goals and individual treatment initiated with focus on functional mobility on unit including basic transfers, simulated car transfer, gait up/down ramp and over mulched surface to initiate community mobility, stair negotiation, bed mobility, gait without AD, and administered Berg Balance Test (see results below - discussed with pt who verbalized understanding and in agreement). Pt overall steadying assist for functional mobility due to decreased balance. Noted to have decreased step length during gait and decreased weightshift onto RLE with tendency for pt to want to hold onto something for support. Educated on progression of use of AD to increase safety with mobility but at this time working on challenging balance while in therapies without AD - pt verbalized understanding and in agreement. D/c planning discussed and support available at home. Pt did not demonstrate any unsafe behaviors and asking insightful questions in regards to equipment and recommendations for support at home upon discharge. Pt with decreased memory noted (such as difficulty with recall of her room # a few minutes after PT told her as well as conversation with brother at start of session).   PT Evaluation Precautions/Restrictions Precautions Precautions: Fall Restrictions Weight Bearing Restrictions: No Pain No complaints of pain currently. States she was having R shoulder pain.  Home Living/Prior Functioning Home Living Available Help at Discharge: Family Type of Home: Mobile home Home Access: Stairs to enter Entrance Stairs-Number of Steps: 4 Entrance Stairs-Rails: Can reach both Home Layout: One level Bathroom Shower/Tub: Chiropodist: Standard  Lives With: Spouse;Family Prior Function Level of Independence: Independent with basic ADLs;Independent with homemaking with  ambulation;Independent with gait;Independent with transfers  Able to Take Stairs?: Yes Driving: No Comments: Performs ADLs, IADLs, and cares for grandchildren.  Pt reports that she has horses and rides them. Vision/Perception  Vision - Assessment Eye Alignment: Within Functional Limits Ocular Range of Motion: Within Functional  Limits Alignment/Gaze Preference: Within Defined Limits Tracking/Visual Pursuits: Able to track stimulus in all quads without difficulty  Cognition Arousal/Alertness: Awake/alert Orientation Level: Oriented X4 Memory: Impaired Memory Impairment: Decreased short term memory Awareness: Appears intact Problem Solving: Appears intact Safety/Judgment: Appears intact Sensation Sensation Light Touch: Appears Intact Stereognosis: Appears Intact Hot/Cold: Appears Intact Proprioception: Appears Intact Coordination Gross Motor Movements are Fluid and Coordinated: Yes Fine Motor Movements are Fluid and Coordinated: Yes Motor  Motor Motor: Other (comment);Hemiplegia (very mild R sided weakness) Motor - Skilled Clinical Observations: generalized weakness; RUE/RLE slightly weaker     Trunk/Postural Assessment  Cervical Assessment Cervical Assessment: Within Functional Limits Thoracic Assessment Thoracic Assessment: Within Functional Limits Lumbar Assessment Lumbar Assessment: Within Functional Limits  Balance Balance Balance Assessed: Yes Standardized Balance Assessment Standardized Balance Assessment: Berg Balance Test Berg Balance Test Sit to Stand: Able to stand  independently using hands Standing Unsupported: Able to stand 2 minutes with supervision Sitting with Back Unsupported but Feet Supported on Floor or Stool: Able to sit safely and securely 2 minutes Stand to Sit: Controls descent by using hands Transfers: Able to transfer safely, definite need of hands Standing Unsupported with Eyes Closed: Able to stand 10 seconds with supervision Standing  Ubsupported with Feet Together: Able to place feet together independently and stand for 1 minute with supervision From Standing, Reach Forward with Outstretched Arm: Can reach forward >12 cm safely (5") From Standing Position, Pick up Object from Floor: Able to pick up shoe, needs supervision From Standing Position, Turn to Look Behind Over each Shoulder: Looks behind one side only/other side shows less weight shift Turn 360 Degrees: Needs close supervision or verbal cueing Standing Unsupported, Alternately Place Feet on Step/Stool: Able to complete >2 steps/needs minimal assist Standing Unsupported, One Foot in Front: Loses balance while stepping or standing Standing on One Leg: Unable to try or needs assist to prevent fall Total Score: 33 Static Sitting Balance Static Sitting - Level of Assistance: 6: Modified independent (Device/Increase time) Dynamic Sitting Balance Dynamic Sitting - Level of Assistance: 5: Stand by assistance Static Standing Balance Static Standing - Level of Assistance: 5: Stand by assistance Dynamic Standing Balance Dynamic Standing - Level of Assistance: 4: Min assist Extremity Assessment  RUE Assessment RUE Assessment: Exceptions to [facility] RUE AROM (degrees) Right Shoulder Flexion: 100 Degrees RUE Strength RUE Overall Strength Comments: 3+/5 in shoulder, 4-/5 distally LUE Assessment LUE Assessment: Exceptions to [facility] LUE AROM (degrees) Left Shoulder Flexion: 120 Degrees LUE Strength LUE Overall Strength Comments: 3+/5 in shoulder, 4-/5 distally RLE Assessment RLE Assessment: Exceptions to [facility] (slightly weaker than LLE; grossly 4/5) LLE Assessment LLE Assessment: Within Functional Limits   See Function Navigator for Current Functional Status.   Refer to Care Plan for Long Term Goals  Recommendations for other services: Neuropsych ; coping  Discharge Criteria: Patient will be discharged from PT if patient refuses treatment 3 consecutive times without  medical reason, if treatment goals not met, if there is a change in medical status, if patient makes no progress towards goals or if patient is discharged from hospital.  The above assessment, treatment plan, treatment alternatives and goals were discussed and mutually agreed upon: by patient  Juanna Cao, PT, DPT  10/12/2016, 11:43 AM

## 2016-10-12 NOTE — Telephone Encounter (Signed)
Called pt to set up tx with Deveshwar, no answer, no VM. JM

## 2016-10-13 ENCOUNTER — Inpatient Hospital Stay (HOSPITAL_COMMUNITY): Payer: Medicaid Other | Admitting: Occupational Therapy

## 2016-10-13 ENCOUNTER — Inpatient Hospital Stay (HOSPITAL_COMMUNITY): Payer: Medicaid Other | Admitting: Physical Therapy

## 2016-10-13 DIAGNOSIS — J42 Unspecified chronic bronchitis: Secondary | ICD-10-CM

## 2016-10-13 LAB — CULTURE, BLOOD (ROUTINE X 2)
Culture: NO GROWTH
Culture: NO GROWTH
Special Requests: ADEQUATE
Special Requests: ADEQUATE

## 2016-10-13 LAB — GLUCOSE, CAPILLARY
GLUCOSE-CAPILLARY: 123 mg/dL — AB (ref 65–99)
GLUCOSE-CAPILLARY: 138 mg/dL — AB (ref 65–99)
GLUCOSE-CAPILLARY: 102 mg/dL — AB (ref 65–99)
Glucose-Capillary: 103 mg/dL — ABNORMAL HIGH (ref 65–99)

## 2016-10-13 NOTE — Progress Notes (Signed)
Occupational Therapy Session Note  Patient Details  Name: [name] MRN: [id number] Date of Birth: January 27, 1961  Today's Date: 10/13/2016 OT Individual Time: 1400-1500 OT Individual Time Calculation (min): 60 min    Short Term Goals: Week 1:  OT Short Term Goal 1 (Week 1): STGs = LTGs  Skilled Therapeutic Interventions/Progress Updates:    Pt seen for OT session addressing ADL  And IADL goals. Pt sitting EOB upon arrival, agreeable to tx session. She declined bathing/dressing session this P.M. Throughout session she ambulated with distant supervision.  Completed simulated tub/shower transfer in ADL apartment with supervision and VCs for technique to step over wall without use of grab bar as she does not have this at home.   Simple meal prep fixing cup of coffee, ambulating with cup and fixing all items at supervision level.   Homemaking task of sweeping floor and utilizing dustpan with supervision.  Toileting task completed mod I.  Returned to room and left seated EOB with bed alarm on. Educated regarding need to call for assist with all mobility needs.  Discussed d/c planning throughout session, follow up with CSW and PT regarding upcoming d/c date.  Therapy Documentation Precautions:  Precautions Precautions: Fall Restrictions Weight Bearing Restrictions: No Pain: Pain Assessment Pain Assessment: No/denies pain Pain Score: 0-No pain ADL: ADL ADL Comments: refer to functional navigator  See Function Navigator for Current Functional Status.   Therapy/Group: Individual Therapy  Lewis, Cayce Paschal C 10/13/2016, 7:17 AM

## 2016-10-13 NOTE — Progress Notes (Signed)
Subjective/Complaints: Patient appears safe and room. According to OT. Did get up once without a fall due to response time from nursing staff  Review of systems negative for chest pain, shortness breath, nausea, vomiting, diarrhea or constipation  Objective: Vital Signs: Blood pressure 131/64, pulse 72, temperature 98.8 F (37.1 C), temperature source Oral, resp. rate 18, height 5' 3"  (1.6 m), weight 94.3 kg (208 lb), SpO2 96 %. No results found. Results for orders placed or performed during the hospital encounter of 10/11/16 (from the past 72 hour(s))  CBC     Status: Abnormal   Collection Time: 10/11/16  6:11 PM  Result Value Ref Range   WBC 8.2 4.0 - 10.5 K/uL   RBC 3.80 (L) 3.87 - 5.11 MIL/uL   Hemoglobin 11.4 (L) 12.0 - 15.0 g/dL   HCT 34.6 (L) 36.0 - 46.0 %   MCV 91.1 78.0 - 100.0 fL   MCH 30.0 26.0 - 34.0 pg   MCHC 32.9 30.0 - 36.0 g/dL   RDW 14.5 11.5 - 15.5 %   Platelets 236 150 - 400 K/uL  Creatinine, serum     Status: None   Collection Time: 10/11/16  6:11 PM  Result Value Ref Range   Creatinine, Ser 0.76 0.44 - 1.00 mg/dL   GFR calc non Af Amer >60 >60 mL/min   GFR calc Af Amer >60 >60 mL/min    Comment: (NOTE) The eGFR has been calculated using the CKD EPI equation. This calculation has not been validated in all clinical situations. eGFR's persistently <60 mL/min signify possible Chronic Kidney Disease.   Glucose, capillary     Status: None   Collection Time: 10/11/16  9:27 PM  Result Value Ref Range   Glucose-Capillary 98 65 - 99 mg/dL  CBC WITH DIFFERENTIAL     Status: Abnormal   Collection Time: 10/12/16  5:19 AM  Result Value Ref Range   WBC 6.3 4.0 - 10.5 K/uL   RBC 3.76 (L) 3.87 - 5.11 MIL/uL   Hemoglobin 11.3 (L) 12.0 - 15.0 g/dL   HCT 34.7 (L) 36.0 - 46.0 %   MCV 92.3 78.0 - 100.0 fL   MCH 30.1 26.0 - 34.0 pg   MCHC 32.6 30.0 - 36.0 g/dL   RDW 14.9 11.5 - 15.5 %   Platelets 225 150 - 400 K/uL   Neutrophils Relative % 61 %   Neutro Abs 3.9  1.7 - 7.7 K/uL   Lymphocytes Relative 27 %   Lymphs Abs 1.7 0.7 - 4.0 K/uL   Monocytes Relative 7 %   Monocytes Absolute 0.4 0.1 - 1.0 K/uL   Eosinophils Relative 4 %   Eosinophils Absolute 0.2 0.0 - 0.7 K/uL   Basophils Relative 1 %   Basophils Absolute 0.0 0.0 - 0.1 K/uL  Comprehensive metabolic panel     Status: Abnormal   Collection Time: 10/12/16  5:19 AM  Result Value Ref Range   Sodium 138 135 - 145 mmol/L   Potassium 3.2 (L) 3.5 - 5.1 mmol/L   Chloride 103 101 - 111 mmol/L   CO2 29 22 - 32 mmol/L   Glucose, Bld 117 (H) 65 - 99 mg/dL   BUN <5 (L) 6 - 20 mg/dL   Creatinine, Ser 0.63 0.44 - 1.00 mg/dL   Calcium 8.6 (L) 8.9 - 10.3 mg/dL   Total Protein 5.9 (L) 6.5 - 8.1 g/dL   Albumin 2.7 (L) 3.5 - 5.0 g/dL   AST 31 15 - 41 U/L  ALT 19 14 - 54 U/L   Alkaline Phosphatase 58 38 - 126 U/L   Total Bilirubin 0.4 0.3 - 1.2 mg/dL   GFR calc non Af Amer >60 >60 mL/min   GFR calc Af Amer >60 >60 mL/min    Comment: (NOTE) The eGFR has been calculated using the CKD EPI equation. This calculation has not been validated in all clinical situations. eGFR's persistently <60 mL/min signify possible Chronic Kidney Disease.    Anion gap 6 5 - 15  Glucose, capillary     Status: Abnormal   Collection Time: 10/12/16  6:52 AM  Result Value Ref Range   Glucose-Capillary 132 (H) 65 - 99 mg/dL  Glucose, capillary     Status: Abnormal   Collection Time: 10/12/16 11:24 AM  Result Value Ref Range   Glucose-Capillary 119 (H) 65 - 99 mg/dL  Glucose, capillary     Status: None   Collection Time: 10/12/16  4:40 PM  Result Value Ref Range   Glucose-Capillary 95 65 - 99 mg/dL  Glucose, capillary     Status: Abnormal   Collection Time: 10/12/16  8:38 PM  Result Value Ref Range   Glucose-Capillary 140 (H) 65 - 99 mg/dL  Glucose, capillary     Status: Abnormal   Collection Time: 10/13/16  6:35 AM  Result Value Ref Range   Glucose-Capillary 123 (H) 65 - 99 mg/dL     HEENT: normal Cardio: RRR  and no murmur Resp: CTA B/L and unlabored GI: BS positive and NT, ND Extremity:  Pulses positive and No Edema Skin:   Intact Neuro: Alert/Oriented, Normal Sensory, Abnormal Motor 4- Right delt , bi, tri, grip, HF, KE, ADF, 4/5 in left delt bi tri grip HF KE ADF and Abnormal FMC Ataxic/ dec FMC Musc/Skel:  Normal Gen NAD   Assessment/Plan: 1. Functional deficits secondary to bilateral frontoparietal infarcts which require 3+ hours per day of interdisciplinary therapy in a comprehensive inpatient rehab setting. Physiatrist is providing close team supervision and 24 hour management of active medical problems listed below. Physiatrist and rehab team continue to assess barriers to discharge/monitor patient progress toward functional and medical goals. FIM: Function - Bathing Position: Shower Body parts bathed by patient: Right arm, Left arm, Chest, Abdomen, Front perineal area, Buttocks, Right upper leg, Left upper leg, Right lower leg, Left lower leg Assist Level: Supervision or verbal cues  Function- Upper Body Dressing/Undressing What is the patient wearing?: Pull over shirt/dress Pull over shirt/dress - Perfomed by patient: Thread/unthread right sleeve, Thread/unthread left sleeve, Put head through opening, Pull shirt over trunk Assist Level: Supervision or verbal cues Function - Lower Body Dressing/Undressing What is the patient wearing?: Underwear, Non-skid slipper socks Position: Sitting EOB Underwear - Performed by patient: Thread/unthread right underwear leg, Thread/unthread left underwear leg, Pull underwear up/down Non-skid slipper socks- Performed by patient: Don/doff right sock, Don/doff left sock Assist for footwear: Supervision/touching assist Assist for lower body dressing: Supervision or verbal cues  Function - Toileting Toileting steps completed by patient: Adjust clothing prior to toileting, Performs perineal hygiene, Adjust clothing after toileting Assist level:  Supervision or verbal cues  Function - Air cabin crew transfer assistive device: Grab bar Assist level to toilet: Touching or steadying assistance (Pt > 75%) Assist level from toilet: Touching or steadying assistance (Pt > 75%)  Function - Chair/bed transfer Chair/bed transfer method: Ambulatory, Stand pivot Chair/bed transfer assist level: Touching or steadying assistance (Pt > 75%) Chair/bed transfer assistive device: Armrests  Function - Locomotion: Wheelchair  Will patient use wheelchair at discharge?: No Function - Locomotion: Ambulation Assistive device: No device Max distance: 160' Assist level: Touching or steadying assistance (Pt > 75%) Assist level: Touching or steadying assistance (Pt > 75%) Assist level: Touching or steadying assistance (Pt > 75%) Assist level: Touching or steadying assistance (Pt > 75%) Assist level: Touching or steadying assistance (Pt > 75%)  Function - Comprehension Comprehension: Auditory Comprehension assist level: Follows basic conversation/direction with no assist  Function - Expression Expression: Verbal Expression assist level: Expresses basic needs/ideas: With no assist  Function - Social Interaction Social Interaction assist level: Interacts appropriately with others with medication or extra time (anti-anxiety, antidepressant).  Function - Problem Solving Problem solving assist level: Solves basic problems with no assist  Function - Memory Memory assist level: More than reasonable amount of time Patient normally able to recall (first 3 days only): Staff names and faces, That he or she is in a hospital, Current season, Location of own room  Medical Problem List and Plan: 1. 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 DVT Prophylaxis/Anticoagulation: Lovenox 40 mg daily. Monitor for any bleeding episodes 3. Pain Management/chronic back  pain: OxyContin sustained release 10 mg every 12 hours, oxycodone immediate release as needed. Monitor mental status with medication 4. Mood: Xanax 1 mg twice a day as needed 5. Neuropsych: This patient iscapable of making decisions on herown behalf. 6. Skin/Wound Care: Routine skin checks 7. 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 Follow-up outpatientinterventional radiology for possible elective angioplasty versus stenting , also has left vertebral stenosis 9.Hypertension. No current antihypertensive medication. Monitor with increased mobility.Patient on Toprol 50 mg daily, Hyzaar 100-12 0.5 mg daily, Norvasc 5 mg daily prior to admission.Controlled 8/22  Resume as needed Vitals:   10/12/16 1357 10/13/16 0404  BP: 97/60 131/64  Pulse: 74 72  Resp: 18 18  Temp: 98.3 F (36.8 C) 98.8 F (37.1 C)  SpO2: 96% 96%   10.Diabetes mellitus with peripheral neuropathy. Hemoglobin A1c 6.5.SSI.Check blood sugars before meals and at bedtime. Diabetic teaching. Patient on Glucophage 500 mg twice a day prior to admission CBG (last 3)   Recent Labs  10/12/16 1640 10/12/16 2038 10/13/16 0635  GLUCAP 95 140* 123*  Controlled off medications, on carb modified diet  11.Hyperlipidemia. Lipitor 12.Tobacco abuse. Counseling 13.History of breast cancer. Currently in remission.  14. Hypokalemia likely from nutrition, off diuretic, continue supplemental potassium LOS (Days) 2 A FACE TO FACE EVALUATION WAS PERFORMED  KIRSTEINS,ANDREW E 10/13/2016, 9:52 AM

## 2016-10-13 NOTE — Progress Notes (Signed)
Social Work Patient ID: [name], female   DOB: 01-05-1961, 56 y.o.   MRN: [id number]  Team feels pt will be ready to go home on Sunday. Have checked with MD and he is agreeable to this. Will prepare pt for discharge Sun. Pt is agreeable to this plan.

## 2016-10-13 NOTE — Progress Notes (Signed)
Physical Therapy Session Note  Patient Details  Name: [name] MRN: [id number] Date of Birth: January 28, 1961  Today's Date: 10/13/2016 PT Individual Time: 6160-7371 AND 1618-1700 PT Individual Time Calculation (min): 71 min AND 42 min   Short Term Goals: Week 1:  PT Short Term Goal 1 (Week 1): = LTGs  Skilled Therapeutic Interventions/Progress Updates:  Session 1  Pt received sitting EOB and agreeable to PT.   Pt instructed pt in DGI see below for results.   Gait training in rehab unit 122f [id number] [id number] AD and distant supervision assist from PT. Gait over unlevel surface at hospital entrance x [id number] up down ramp to parking garage no AD from PT and distant supervision assist from PT for safety awareness. No LOB noted with all gait training.   Nustep reciprocal movement training x 8 minutes with one rest break. Min cues from PT for increased speed of steps to >40. Level 2>4 resistance throughout training.   Pt instructed pt in dynamic balance training to toss ball off trampoline 1 minute x 2. Pt noted to drop ball x 3 but able to pick ball up each time without additional assistance from PT.   [facility] too [facility] and all needs met.     Session 2.   Pt received supine in bed and agreeable to PT. Supine>sit transfer without assist or cues from PT  Pt instructed in gait training to rehab gym with distant supervision assist x [id number] [place] Level B exercises program instructed by PT; 3 bout for each exercise with supervision assist for safety.  Reciprocal foot taps on 6 inch step with min assist progressing to supervision assist x 10 and lateral foot taps on 6 inch step x 10 with supervision assist from PT.   Pt instructed pt in floor transfer with supervision assist. Pt educated on when appropriate to attempt transfers and when to activate EMS. Moderate cues for proper technique and sequencing to transfer  from half kneeling to standing to BUE supported on stabile surface.   Pt returned to room and performed ambulatory transfer to bed with supervision assist. Sit>supine completed without assist, and  Pt left supine in bed with call bell in reach and all needs met.              Therapy Documentation Precautions:  Precautions Precautions: Fall Restrictions Weight Bearing Restrictions: No Pain: Pain Assessment Pain Assessment: 0-10 Pain Score: 9  Pain Type: Acute pain Pain Location: Knee Pain Orientation: Right;Left Pain Descriptors / Indicators: Aching Pain Onset: On-going Pain Intervention(s): Medication (See eMAR)     Balance: Dynamic Gait Index Level Surface: Mild Impairment Change in Gait Speed: Mild Impairment Gait with Horizontal Head Turns: Moderate Impairment Gait with Vertical Head Turns: Mild Impairment Gait and Pivot Turn: Normal Step Over Obstacle: Mild Impairment Step Around Obstacles: Normal Steps: Mild Impairment Total Score: 17 (<19 indicates increased fall risk.)  Exercises:    See Function Navigator for Current Functional Status.   Therapy/Group: Individual Therapy  AustiLorie Phenix/2018, 1:01 PM

## 2016-10-13 NOTE — Discharge Summary (Deleted)
Discharge summary job [id number]

## 2016-10-13 NOTE — Progress Notes (Signed)
Occupational Therapy Session Note  Patient Details  Name: [name] MRN: [id number] Date of Birth: 14-May-1960  Today's Date: 10/13/2016 OT Individual Time: 0830-0900 OT Individual Time Calculation (min): 30 min    Short Term Goals: Week 1:  OT Short Term Goal 1 (Week 1): STGs = LTGs  Skilled Therapeutic Interventions/Progress Updates:    Pt received sitting in w/c with quick release belt on. Pt declined a shower today as she was still waiting for her family to bring her more clothing and she was having B knee pain. Pt was agreeable to ambulating to bathroom (S only), toileting with S, and then completing grooming at sink in standing with S.  She has a tendency to want to "furniture/ wall walk" and needs cues to relax her arms by her side.  Pt sat to EOB and bed alarm set.  Discussed her short LOS and the goals to be accomplished.  Pt with all needs met.  Therapy Documentation Precautions:  Precautions Precautions: Fall Restrictions Weight Bearing Restrictions: No   Pain: Pain Assessment Pain Assessment: 0-10 Pain Score: 3  Pain Type: Acute pain Pain Location: Knee Pain Orientation: Right;Left Pain Descriptors / Indicators: Aching Pain Onset: On-going Pain Intervention(s): Emotional support ADL: ADL ADL Comments: refer to functional navigator  See Function Navigator for Current Functional Status.   Therapy/Group: Individual Therapy  [place] 10/13/2016, 11:40 AM

## 2016-10-14 ENCOUNTER — Inpatient Hospital Stay (HOSPITAL_COMMUNITY): Payer: Medicaid Other | Admitting: Occupational Therapy

## 2016-10-14 ENCOUNTER — Inpatient Hospital Stay (HOSPITAL_COMMUNITY): Payer: Medicaid Other | Admitting: Physical Therapy

## 2016-10-14 ENCOUNTER — Ambulatory Visit (HOSPITAL_COMMUNITY): Payer: Medicaid Other

## 2016-10-14 LAB — GLUCOSE, CAPILLARY
GLUCOSE-CAPILLARY: 110 mg/dL — AB (ref 65–99)
GLUCOSE-CAPILLARY: 133 mg/dL — AB (ref 65–99)
GLUCOSE-CAPILLARY: 140 mg/dL — AB (ref 65–99)
Glucose-Capillary: 120 mg/dL — ABNORMAL HIGH (ref 65–99)

## 2016-10-14 MED ORDER — METOPROLOL SUCCINATE ER 50 MG PO TB24: 50.0000 mg | ORAL_TABLET | Freq: Every day | ORAL | 0 refills | Status: DC

## 2016-10-14 MED ORDER — ALPRAZOLAM 1 MG PO TABS: 1.0000 mg | ORAL_TABLET | Freq: Two times a day (BID) | ORAL | 0 refills | Status: DC | PRN

## 2016-10-14 MED ORDER — METFORMIN HCL 500 MG PO TABS: 500.0000 mg | ORAL_TABLET | Freq: Two times a day (BID) | ORAL | 0 refills | Status: DC

## 2016-10-14 MED ORDER — ATORVASTATIN CALCIUM 10 MG PO TABS: 10.0000 mg | ORAL_TABLET | Freq: Every day | ORAL | 0 refills | Status: DC

## 2016-10-14 MED ORDER — OXYCODONE HCL ER 10 MG PO T12A: 10.0000 mg | EXTENDED_RELEASE_TABLET | Freq: Two times a day (BID) | ORAL | 0 refills | Status: DC

## 2016-10-14 MED ORDER — OXYCODONE HCL 15 MG PO TABS: 15.0000 mg | ORAL_TABLET | ORAL | 0 refills | Status: DC | PRN

## 2016-10-14 MED ORDER — METOPROLOL SUCCINATE ER 50 MG PO TB24
50.0000 mg | ORAL_TABLET | Freq: Every day | ORAL | Status: DC
  Administered 2016-10-14 – 2016-10-15 (×2): 50 mg via ORAL
  Filled 2016-10-14 (×2): qty 1

## 2016-10-14 MED ORDER — DIPHENHYDRAMINE HCL 25 MG PO CAPS
25.0000 mg | ORAL_CAPSULE | ORAL | Status: DC | PRN
  Administered 2016-10-14: 25 mg via ORAL
  Filled 2016-10-14: qty 1

## 2016-10-14 MED ORDER — HYDROCERIN EX CREA
TOPICAL_CREAM | Freq: Two times a day (BID) | CUTANEOUS | Status: DC
  Administered 2016-10-14 – 2016-10-15 (×2): via TOPICAL
  Filled 2016-10-14: qty 113

## 2016-10-14 MED ORDER — CLOPIDOGREL BISULFATE 75 MG PO TABS: 75.0000 mg | ORAL_TABLET | Freq: Every day | ORAL | 0 refills | Status: DC

## 2016-10-14 NOTE — IPOC Note (Signed)
Overall Plan of Care [facility]) Patient Details Name: [name] MRN: [id number] DOB: 03-Dec-1960  Admitting Diagnosis: B CVA  Hospital Problems: Principal Problem:   Acute bilat watershed infarction [facility]) Active Problems:   Essential hypertension   COPD (chronic obstructive pulmonary disease) (HCC)   Diabetes mellitus type 2 in obese [facility])     Functional Problem List: Nursing Bladder, Edema, Endurance, Medication Management, Safety, Skin Integrity  PT Balance, Endurance, Motor, Safety, Pain  OT Balance, Motor, Endurance  SLP    TR         Basic ADL's: OT Bathing, Dressing, Toileting     Advanced  ADL's: OT Simple Meal Preparation, Light Housekeeping, Laundry     Transfers: PT Bed Mobility, Bed to Chair, Musician, [name], Futures trader, Metallurgist: PT Ambulation, Stairs     Additional Impairments: OT None  SLP        TR      Anticipated Outcomes Item Anticipated Outcome  Self Feeding Independent  Swallowing      Basic self-care  Mod I  Toileting  Mod I   Bathroom Transfers Mod I  Bowel/Bladder  Min assist  Transfers  mod I basic transfers  Locomotion  mod I household gait; supervision stairs and community mobility  Communication     Cognition     Pain  chronic pain  Safety/Judgment  min assist   Therapy Plan: PT Intensity: Minimum of 1-2 x/day ,45 to 90 minutes PT Frequency: 5 out of 7 days PT Duration Estimated Length of Stay: 6-9 days OT Intensity: Minimum of 1-2 x/day, 45 to 90 minutes OT Frequency: 5 out of 7 days OT Duration/Estimated Length of Stay: 6-9 days      Team Interventions: Nursing Interventions Patient/Family Education, Bladder Management, Disease Management/Prevention, Cognitive Remediation/Compensation, Skin Care/Wound Management, Medication Management, Discharge Planning  PT interventions Ambulation/gait training, Balance/vestibular training, Community reintegration, Discharge planning, Disease  management/prevention, DME/adaptive equipment instruction, Functional mobility training, Neuromuscular re-education, Pain management, Patient/family education, Psychosocial support, Stair training, Therapeutic Activities, Therapeutic Exercise, UE/LE Strength taining/ROM, UE/LE Coordination activities, Wheelchair propulsion/positioning  OT Interventions Discharge planning, Training and development officer, DME/adaptive equipment instruction, Functional mobility training, Neuromuscular re-education, Self Care/advanced ADL retraining, Patient/family education, Therapeutic Exercise, Therapeutic Activities, UE/LE Strength taining/ROM  SLP Interventions    TR Interventions    SW/CM Interventions Discharge Planning, Psychosocial Support, Patient/Family Education   Barriers to Discharge MD  Medical stability, Home enviroment access/loayout and Lack of/limited family support  Nursing Decreased caregiver support, Lack of/limited family support, Medication compliance, Nutrition means Patient was taking care of grandchildren PTA, doesn't have stable support system in place. Dietary compliance for diabetic diet is going to be an issue because she eats a lot of junk food. Medication compliance is posslible another issue due to lack of possible transportation and funding problems.  PT      OT      SLP      SW Lack of/limited family support, Medication compliance Does not have 24 hr care and has substance abuse issues   Team Discharge Planning: Destination: PT-Home ,OT- Home , SLP-Home Projected Follow-up: PT-Home health PT, OT-  Home health OT, SLP-None Projected Equipment Needs: PT-To be determined (SPC vs QC), OT- Tub/shower bench, SLP-None recommended by SLP Equipment Details: PT- , OT-  Patient/family involved in discharge planning: PT- Patient,  OT-Patient, SLP-Patient  MD ELOS: 5-7d Medical Rehab Prognosis:  Excellent Assessment:  56 y.o.right handed femalewith history of hypertension, diabetes  mellitus, breast cancer  in remission, chronic back pain, tobacco abuse. Presented 10/09/2016 with right side weakness, slurred speechas well as recent fall and had been essentially bedbound for the past week or so. Per chart review patient lives with boyfriend. One level home for steps to entry. Independent prior to admission. Cranial CT scan showed no evidence of acute infarction. Blood pressure 88/40. Urine drug screen positive for opiates and benzos. MRI of the brain showed numerous acute small frontoparietal infarcts including the small vessel and MCA territories. MRI cervical spine without neurocompression. CT angiogram of head and neck showed no emergent large vessel occlusion. 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 Lower extremity Dopplers negative. Patient did not receive TPA. Cerebral angiogram 10/10/2016 per interventional radiology showed approximately 70% stenosis right MCA proximal M1 segment. Approximately 50% stenosis of right VA origin. Echocardiogram with ejection fraction 65% no wall motion abnormalities. Presently on aspirin and Plavixfor CVA prophylaxis. Subcutaneous Lovenox for DVT prophylaxis   Now requiring 24/7 Rehab RN,MD, as well as CIR level PT, OT and SLP.  Treatment team will focus on ADLs and mobility with goals set at Mod I See Team Conference Notes for weekly updates to the plan of care

## 2016-10-14 NOTE — Progress Notes (Addendum)
Subjective/Complaints:   Review of systems negative for chest pain, shortness breath, nausea, vomiting, diarrhea or constipation  Objective: Vital Signs: Blood pressure 119/65, pulse 70, temperature 98.4 F (36.9 C), temperature source Oral, resp. rate 18, height _0  (1.6 m), weight 94.3 kg (208 lb), SpO2 100 %. No results found. Results for orders placed or performed during the hospital encounter of 10/11/16 (from the past 72 hour(s))  CBC     Status: Abnormal   Collection Time: 10/11/16  6:11 PM  Result Value Ref Range   WBC 8.2 4.0 - 10.5 K/uL   RBC 3.80 (L) 3.87 - 5.11 MIL/uL   Hemoglobin 11.4 (L) 12.0 - 15.0 g/dL   HCT 34.6 (L) 36.0 - 46.0 %   MCV 91.1 78.0 - 100.0 fL   MCH 30.0 26.0 - 34.0 pg   MCHC 32.9 30.0 - 36.0 g/dL   RDW 14.5 11.5 - 15.5 %   Platelets 236 150 - 400 K/uL  Creatinine, serum     Status: None   Collection Time: 10/11/16  6:11 PM  Result Value Ref Range   Creatinine, Ser 0.76 0.44 - 1.00 mg/dL   GFR calc non Af Amer >60 >60 mL/min   GFR calc Af Amer >60 >60 mL/min    Comment: (NOTE) The eGFR has been calculated using the CKD EPI equation. This calculation has not been validated in all clinical situations. eGFR's persistently <60 mL/min signify possible Chronic Kidney Disease.   Glucose, capillary     Status: None   Collection Time: 10/11/16  9:27 PM  Result Value Ref Range   Glucose-Capillary 98 65 - 99 mg/dL  CBC WITH DIFFERENTIAL     Status: Abnormal   Collection Time: 10/12/16  5:19 AM  Result Value Ref Range   WBC 6.3 4.0 - 10.5 K/uL   RBC 3.76 (L) 3.87 - 5.11 MIL/uL   Hemoglobin 11.3 (L) 12.0 - 15.0 g/dL   HCT 34.7 (L) 36.0 - 46.0 %   MCV 92.3 78.0 - 100.0 fL   MCH 30.1 26.0 - 34.0 pg   MCHC 32.6 30.0 - 36.0 g/dL   RDW 14.9 11.5 - 15.5 %   Platelets 225 150 - 400 K/uL   Neutrophils Relative % 61 %   Neutro Abs 3.9 1.7 - 7.7 K/uL   Lymphocytes Relative 27 %   Lymphs Abs 1.7 0.7 - 4.0 K/uL   Monocytes Relative 7 %   Monocytes  Absolute 0.4 0.1 - 1.0 K/uL   Eosinophils Relative 4 %   Eosinophils Absolute 0.2 0.0 - 0.7 K/uL   Basophils Relative 1 %   Basophils Absolute 0.0 0.0 - 0.1 K/uL  Comprehensive metabolic panel     Status: Abnormal   Collection Time: 10/12/16  5:19 AM  Result Value Ref Range   Sodium 138 135 - 145 mmol/L   Potassium 3.2 (L) 3.5 - 5.1 mmol/L   Chloride 103 101 - 111 mmol/L   CO2 29 22 - 32 mmol/L   Glucose, Bld 117 (H) 65 - 99 mg/dL   BUN <5 (L) 6 - 20 mg/dL   Creatinine, Ser 0.63 0.44 - 1.00 mg/dL   Calcium 8.6 (L) 8.9 - 10.3 mg/dL   Total Protein 5.9 (L) 6.5 - 8.1 g/dL   Albumin 2.7 (L) 3.5 - 5.0 g/dL   AST 31 15 - 41 U/L   ALT 19 14 - 54 U/L   Alkaline Phosphatase 58 38 - 126 U/L   Total Bilirubin 0.4 0.3 -  1.2 mg/dL   GFR calc non Af Amer >60 >60 mL/min   GFR calc Af Amer >60 >60 mL/min    Comment: (NOTE) The eGFR has been calculated using the CKD EPI equation. This calculation has not been validated in all clinical situations. eGFR's persistently <60 mL/min signify possible Chronic Kidney Disease.    Anion gap 6 5 - 15  Glucose, capillary     Status: Abnormal   Collection Time: 10/12/16  6:52 AM  Result Value Ref Range   Glucose-Capillary 132 (H) 65 - 99 mg/dL  Glucose, capillary     Status: Abnormal   Collection Time: 10/12/16 11:24 AM  Result Value Ref Range   Glucose-Capillary 119 (H) 65 - 99 mg/dL  Glucose, capillary     Status: None   Collection Time: 10/12/16  4:40 PM  Result Value Ref Range   Glucose-Capillary 95 65 - 99 mg/dL  Glucose, capillary     Status: Abnormal   Collection Time: 10/12/16  8:38 PM  Result Value Ref Range   Glucose-Capillary 140 (H) 65 - 99 mg/dL  Glucose, capillary     Status: Abnormal   Collection Time: 10/13/16  6:35 AM  Result Value Ref Range   Glucose-Capillary 123 (H) 65 - 99 mg/dL  Glucose, capillary     Status: Abnormal   Collection Time: 10/13/16 11:48 AM  Result Value Ref Range   Glucose-Capillary 138 (H) 65 - 99 mg/dL   Glucose, capillary     Status: Abnormal   Collection Time: 10/13/16  5:02 PM  Result Value Ref Range   Glucose-Capillary 103 (H) 65 - 99 mg/dL  Glucose, capillary     Status: Abnormal   Collection Time: 10/13/16  9:15 PM  Result Value Ref Range   Glucose-Capillary 102 (H) 65 - 99 mg/dL  Glucose, capillary     Status: Abnormal   Collection Time: 10/14/16  6:31 AM  Result Value Ref Range   Glucose-Capillary 110 (H) 65 - 99 mg/dL     HEENT: normal Cardio: RRR and no murmur Resp: CTA B/L and unlabored GI: BS positive and NT, ND Extremity:  Pulses positive and No Edema Skin:   Intact Neuro: Alert/Oriented, Normal Sensory, Abnormal Motor 4- Right delt , bi, tri, grip, HF, KE, ADF, 4/5 in left delt bi tri grip HF KE ADF and Abnormal FMC Ataxic/ dec FMC Musc/Skel:  Normal Gen NAD   Assessment/Plan: 1. Functional deficits secondary to bilateral frontoparietal infarcts which require 3+ hours per day of interdisciplinary therapy in a comprehensive inpatient rehab setting. Physiatrist is providing close team supervision and 24 hour management of active medical problems listed below. Physiatrist and rehab team continue to assess barriers to discharge/monitor patient progress toward functional and medical goals. FIM: Function - Bathing Position: Shower Body parts bathed by patient: Right arm, Left arm, Chest, Abdomen, Front perineal area, Buttocks, Right upper leg, Left upper leg, Right lower leg, Left lower leg Assist Level: Supervision or verbal cues  Function- Upper Body Dressing/Undressing What is the patient wearing?: Pull over shirt/dress Pull over shirt/dress - Perfomed by patient: Thread/unthread right sleeve, Thread/unthread left sleeve, Put head through opening, Pull shirt over trunk Assist Level: Supervision or verbal cues Function - Lower Body Dressing/Undressing What is the patient wearing?: Underwear, Non-skid slipper socks Position: Sitting EOB Underwear - Performed by  patient: Thread/unthread right underwear leg, Thread/unthread left underwear leg, Pull underwear up/down Non-skid slipper socks- Performed by patient: Don/doff right sock, Don/doff left sock Assist for footwear: Supervision/touching assist  Assist for lower body dressing: Supervision or verbal cues  Function - Toileting Toileting steps completed by patient: Adjust clothing prior to toileting, Performs perineal hygiene, Adjust clothing after toileting Toileting Assistive Devices: Grab bar or rail Assist level: Supervision or verbal cues  Function - Toilet Transfers Toilet transfer assistive device: Grab bar Assist level to toilet: Supervision or verbal cues Assist level from toilet: Supervision or verbal cues  Function - Chair/bed transfer Chair/bed transfer method: Ambulatory Chair/bed transfer assist level: Supervision or verbal cues Chair/bed transfer assistive device: Armrests  Function - Locomotion: Wheelchair Will patient use wheelchair at discharge?: No Function - Locomotion: Ambulation Assistive device: No device Max distance: 227f  Assist level: Supervision or verbal cues Assist level: Supervision or verbal cues Assist level: Supervision or verbal cues Assist level: Supervision or verbal cues Assist level: Touching or steadying assistance (Pt > 75%)  Function - Comprehension Comprehension: Auditory Comprehension assist level: Follows basic conversation/direction with no assist  Function - Expression Expression: Verbal Expression assist level: Expresses basic needs/ideas: With no assist  Function - Social Interaction Social Interaction assist level: Interacts appropriately 90% of the time - Needs monitoring or encouragement for participation or interaction.  Function - Problem Solving Problem solving assist level: Solves basic problems with no assist  Function - Memory Memory assist level: Recognizes or recalls 90% of the time/requires cueing < 10% of the  time Patient normally able to recall (first 3 days only): Staff names and faces, That he or she is in a hospital, Current season, Location of own room  Medical Problem List and Plan: 1. 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 DVT Prophylaxis/Anticoagulation: Lovenox 40 mg daily. Monitor for any bleeding episodes 3. Pain Management/chronic back pain: OxyContin sustained release 10 mg every 12 hours, oxycodone immediate release as needed. Monitor mental status with medication 4. Mood: Xanax 1 mg twice a day as needed 5. Neuropsych: This patient iscapable of making decisions on herown behalf. 6. Skin/Wound Care: Routine skin checks 7. 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 Follow-up outpatientinterventional radiology for possible elective angioplasty versus stenting , also has left vertebral stenosis 9.Hypertension. No current antihypertensive medication. Monitor with increased mobility.Patient on Toprol 50 mg daily, Hyzaar 100-12 0.5 mg daily, Norvasc 5 mg daily prior to admission.Controlled 8/22  Resume as needed Vitals:   10/13/16 1400 10/14/16 0509  BP: 126/62 119/65  Pulse: 73 70  Resp: 18 18  Temp: 98.7 F (37.1 C) 98.4 F (36.9 C)  SpO2: 97% 100%   10.Diabetes mellitus with peripheral neuropathy. Hemoglobin A1c 6.5.SSI.Check blood sugars before meals and at bedtime. Diabetic teaching. Patient on Glucophage 500 mg twice a day prior to admission CBG (last 3)   Recent Labs  10/13/16 1702 10/13/16 2115 10/14/16 0631  GLUCAP 103* 102* 110*  Controlled off medications, on carb modified diet  11.Hyperlipidemia. Lipitor 12.Tobacco abuse. Counseling 13.History of breast cancer. Currently in remission.  14. Hypokalemia likely from nutrition, off diuretic, continue  supplemental potassium Recheck BMET 8/25 LOS (Days) 3 A FACE TO FACE EVALUATION WAS PERFORMED  Adrielle Polakowski E 10/14/2016, 8:19 AM

## 2016-10-14 NOTE — Progress Notes (Signed)
Physical Therapy Discharge Summary  Patient Details  Name: [name] MRN: [id number] Date of Birth: [birth year]  Today's Date: 10/14/2016 PT Individual Time: 1400-1500 PT Individual Time Calculation (min): 60 min    Patient has met 10 of 10 long term goals due to improved activity tolerance, improved balance, improved postural control, increased strength, increased range of motion and functional use of  right upper extremity and right lower extremity.  Patient to discharge at an ambulatory level Modified Independent.   Patient's care partner is independent to provide the necessary physical assistance at discharge.  Reasons goals not met: all tx goals met.   Recommendation:  Patient will benefit from ongoing skilled PT services in Out patient setting.  to continue to advance safe functional mobility, address ongoing impairments in balance, safety, and minimize fall risk.  Equipment: No equipment provided  Reasons for discharge: treatment goals met and discharge from hospital  Patient/family agrees with progress made and goals achieved: Yes   PT Treatment Pt instructed pt in grad day assessment to measure progress towards goals. See below for details. Pt ambulated 555f+ x 5 over various surfaces in various environments around hospital perimeter as well as ascent and descent of 24 steps without assist from PT and 1 rail in the R with intermittent step over step pattern. Pt returned to room and performed ambulatory transfer to bed without assist. Pt made Mod I in room and educated on safety without assist in room.    PT Discharge Precautions/Restrictions Precautions Precautions: None Restrictions Weight Bearing Restrictions: No Vital Signs   Pain Pain Assessment Pain Assessment: No/denies pain Vision/Perception  Perception Perception: Within Functional Limits Praxis Praxis: Intact  Cognition Overall Cognitive Status: Within Functional Limits for tasks  assessed Arousal/Alertness: Awake/alert Orientation Level: Oriented X4 Memory: Impaired Memory Impairment: Decreased short term memory Awareness: Appears intact Problem Solving: Appears intact Safety/Judgment: Impaired Sensation Sensation Light Touch: Appears Intact Stereognosis: Appears Intact Hot/Cold: Appears Intact Proprioception: Appears Intact Coordination Gross Motor Movements are Fluid and Coordinated: Yes Fine Motor Movements are Fluid and Coordinated: Yes Motor  Motor Motor - Discharge Observations: improved overall   Mobility Transfers Sit to Stand: 6: Modified independent (Device/Increase time) Stand to Sit: 6: Modified independent (Device/Increase time) Locomotion  Ambulation Ambulation: Yes Ambulation/Gait Assistance: 6: Modified independent (Device/Increase time) Ambulation Distance (Feet): 500 Feet Assistive device: None Stairs / Additional Locomotion Stairs: Yes Stairs Assistance: 6: Modified independent (Device/Increase time) Stair Management Technique: One rail Right Number of Stairs: 24 Height of Stairs: 6 Ramp: 6: Modified independent (Device) Curb: 6: Modified independent (Device/increase time) Wheelchair Mobility Wheelchair Mobility: No  Trunk/Postural Assessment  Cervical Assessment Cervical Assessment: Within Functional Limits Thoracic Assessment Thoracic Assessment: Within Functional Limits Lumbar Assessment Lumbar Assessment: Within Functional Limits Postural Control Postural Control: Within Functional Limits  Balance Balance Balance Assessed: Yes Static Sitting Balance Static Sitting - Level of Assistance: 6: Modified independent (Device/Increase time) Dynamic Sitting Balance Dynamic Sitting - Level of Assistance: 6: Modified independent (Device/Increase time) Static Standing Balance Static Standing - Level of Assistance: 6: Modified independent (Device/Increase time) Dynamic Standing Balance Dynamic Standing - Level of  Assistance: 6: Modified independent (Device/Increase time) Extremity Assessment  RUE Assessment RUE Assessment: Within Functional Limits LUE Assessment LUE Assessment: Within Functional Limits RLE Assessment RLE Assessment: [facility](slightly weaker than LLE; grossly 4+/5) LLE Assessment LLE Assessment: Within Functional Limits   See Function Navigator for Current Functional Status.  ALorie Phenix8/24/2018, 5:07 PM

## 2016-10-14 NOTE — Progress Notes (Addendum)
Social Work Patient ID: [name], female   DOB: 1960/07/12, 56 y.o.   MRN: [id number]  Met with pt and sister who was here who feels someone needs to be at home with pt. Discussed pt is doing well and is functioning at a mod/i level. She is somewhat disoriented today and talking about an apartment to sister which she does not live in. Pt voiced she didn't sleep last night and is having stomach issues form not being able to have a bowel movement. RN is aware and working on this. She does not seem to be as steady on her feet today. Will ask PT to evaluate and see if will need cane or walker to go home with. Can fill out [facility] paperwork and see if would be eligible for services, both want this to be tried. Aware may not meet their criteria for this service.  Unable to make PCP follow up appointment due to close on Friday, pt aware and will contact on Monday and follow up with.

## 2016-10-14 NOTE — Discharge Summary (Signed)
Discharge summary job [phone]

## 2016-10-14 NOTE — Progress Notes (Signed)
Social Work Patient ID: [name], female   DOB: [birth year], 56 y.o.   MRN: [id number] Team feels pt can met her goals today and go home tomorrow. MD consulted and plan to move up discharge to tomorrow. Pt very pleased with this plan.

## 2016-10-14 NOTE — Progress Notes (Signed)
Occupational Therapy Session Note  Patient Details  Name: CELICA KOTOWSKI MRN: [id number] Date of Birth: May 04, 1960  Today's Date: 10/14/2016 OT Individual Time: 1941-7408 OT Individual Time Calculation (min): 25 min   OT Missed Time: 50 min (pt declined due to severe constipation)   Short Term Goals: Week 1:  OT Short Term Goal 1 (Week 1): STGs = LTGs  Skilled Therapeutic Interventions/Progress Updates:    Pt seen for OT session focusing on functional ambulation, ADL re-training, and safety awareness. Pt sitting EOB upon arrival, requesting toileting task. She ambulated throughout room with distant supervision to bathroom x2 during session and with urgency. VCs required for safety awareness for positioning as pt attempting to sit on toilet prior to being completely on toilet, and was sitting to void without actually being over toilet. She completed hand hygiene and grooming tasks at sink with mod I. Pt declined remainder of session due to pain from constipation, left in supine with all needs in reach and bed alarm activated. RN made aware of pt's complaints.  Education provided throughout session regarding benefits of mobility for bowel management, safety awareness, and d/c planning.  Therapist returned at 14:15 to attempt make-up, pt with another discipline at this time.  Therapy Documentation Precautions:  Precautions Precautions: Fall Restrictions Weight Bearing Restrictions: No ADL: ADL ADL Comments: refer to functional navigator  See Function Navigator for Current Functional Status.   Therapy/Group: Individual Therapy  Lewis, Zaniyah Wernette C 10/14/2016, 7:03 AM

## 2016-10-14 NOTE — Discharge Instructions (Signed)
STROKE/TIA DISCHARGE INSTRUCTIONS SMOKING Cigarette smoking nearly doubles your risk of having a stroke & is the single most alterable risk factor  If you smoke or have smoked in the last 12 months, you are advised to quit smoking for your health.  Most of the excess cardiovascular risk related to smoking disappears within a year of stopping.  Ask you doctor about anti-smoking medications  [facility] Quit Line: 1-800-QUIT NOW  Free Smoking Cessation Classes [phone]  CHOLESTEROL Know your levels; limit fat & cholesterol in your diet  Lipid Panel     Component Value Date/Time   CHOL 144 10/09/2016 0311   TRIG 202 (H) 10/09/2016 0311   HDL 24 (L) 10/09/2016 0311   CHOLHDL 6.0 10/09/2016 0311   VLDL 40 10/09/2016 0311   LDLCALC 80 10/09/2016 0311      Many patients benefit from treatment even if their cholesterol is at goal.  Goal: Total Cholesterol (CHOL) less than 160  Goal:  Triglycerides (TRIG) less than 150  Goal:  HDL greater than 40  Goal:  LDL (LDLCALC) less than 100   BLOOD PRESSURE American Stroke Association blood pressure target is less that 120/80 mm/Hg  Your discharge blood pressure is:  BP: 130/68  Monitor your blood pressure  Limit your salt and alcohol intake  Many individuals will require more than one medication for high blood pressure  DIABETES (A1c is a blood sugar average for last 3 months) Goal HGBA1c is under 7% (HBGA1c is blood sugar average for last 3 months)  Diabetes:    Lab Results  Component Value Date   HGBA1C 6.5 (H) 10/09/2016     Your HGBA1c can be lowered with medications, healthy diet, and exercise.  Check your blood sugar as directed by your physician  Call your physician if you experience unexplained or low blood sugars.  PHYSICAL ACTIVITY/REHABILITATION Goal is 30 minutes at least 4 days per week  Activity: Increase activity slowly, Therapies: Physical Therapy: Home Health Return to work:   Activity decreases your risk of  heart attack and stroke and makes your heart stronger.  It helps control your weight and blood pressure; helps you relax and can improve your mood.  Participate in a regular exercise program.  Talk with your doctor about the best form of exercise for you (dancing, walking, swimming, cycling).  DIET/WEIGHT Goal is to maintain a healthy weight  Your discharge diet is: Diet Carb Modified Fluid consistency: Thin; Room service appropriate? Yes  liquids Your height is:  Height: 5\' 3"  (160 cm) Your current weight is: Weight: 94.3 kg (208 lb) Your Body Mass Index (BMI) is:  BMI (Calculated): 36.85  Following the type of diet specifically designed for you will help prevent another stroke.  Your goal weight range is:    Your goal Body Mass Index (BMI) is 19-24.  Healthy food habits can help reduce 3 risk factors for stroke:  High cholesterol, hypertension, and excess weight.  RESOURCES Stroke/Support Group:  Call [phone]   STROKE EDUCATION PROVIDED/REVIEWED AND GIVEN TO PATIENT Stroke warning signs and symptoms How to activate emergency medical system (call 911). Medications prescribed at discharge. Need for follow-up after discharge. Personal risk factors for stroke. Pneumonia vaccine given:  Flu vaccine given:  My questions have been answered, the writing is legible, and I understand these instructions.  I will adhere to these goals & educational materials that have been provided to me after my discharge from the hospital.   Inpatient Rehab Discharge Instructions  [facility]  [facility] Discharge date and time: No discharge date for patient encounter.   Activities/Precautions/ Functional Status: Activity: activity as tolerated Diet: diabetic diet Wound Care: none needed Functional status:  ___ No restrictions     ___ Walk up steps independently ___ 24/7 supervision/assistance   ___ Walk up steps with assistance ___ Intermittent supervision/assistance  ___ Bathe/dress  independently ___ Walk with walker     _x__ Bathe/dress with assistance ___ Walk Independently    ___ Shower independently ___ Walk with assistance    ___ Shower with assistance ___ No alcohol     ___ Return to work/school ________  Special Instructions: No smoking.   COMMUNITY REFERRALS UPON DISCHARGE:   None:  THERAPISTS DO NOT RECOMMEND ANY FOLLOW UP-GIVEN HOME EXERCISE PROGRAM   Medical Equipment/Items Ordered:NO NEEDS    GENERAL COMMUNITY RESOURCES FOR PATIENT/FAMILY: Support Groups:[facility] SUPPORT GROUP EVERY SECOND Thursday @ 3:00-4:00 PM ON THE REHAB UNIT QUESTIONS CONTACT CAITLYN [phone]  My questions have been answered and I understand these instructions. I will adhere to these goals and the provided educational materials after my discharge from the hospital.  Patient/Caregiver Signature _______________________________ Date __________  Clinician Signature _______________________________________ Date __________  Please bring this form and your medication list with you to all your follow-up doctor's appointments.

## 2016-10-14 NOTE — Progress Notes (Signed)
Occupational Therapy Discharge Summary  Patient Details  Name: [name] MRN: [id number] Date of Birth: 07/17/1960  Today's Date: 10/14/2016 OT Individual Time: 1300-1400 OT Individual Time Calculation (min): 60 min   1:1 Grad day! Pt reports Lamonte Sakai feels better than this morning. Self care retraining at shower level sit to stand in shower; dressing with obtaining her own clothing; functional ambulation (wihtout demonstration of furniture walking), picking items off the floor to "clean up bathroom" after use; grooming sit to stand at sink etc at mod I level. Pt also performed laundry and basic kitchen tasks with supervision for use of our facilities.    Patient has met 11 of 11 long term goals due to improved activity tolerance, improved balance, postural control, ability to compensate for deficits, functional use of  RIGHT upper, RIGHT lower, LEFT upper and LEFT lower extremity, improved attention, improved awareness and improved coordination.  Patient to discharge at overall Modified Independent level.  Patient's care partner is independent to provide the necessary physical assistance at discharge for iADLs.  Reasons goals not met: n/a  Recommendation:  Patient will benefit from ongoing skilled OT services in outpatient setting to continue to advance functional skills in the area of iADLs..  Equipment: Already has a tub bench.  Reasons for discharge: treatment goals met and discharge from hospital  Patient/family agrees with progress made and goals achieved: Yes  OT Discharge Precautions/Restrictions  Precautions Precautions: None Restrictions Weight Bearing Restrictions: No General OT Amount of Missed Time: 50 Minutes Vital Signs   Pain Pain Assessment Pain Assessment: No/denies pain ADL ADL ADL Comments: see functional navigator Vision Baseline Vision/History: Wears glasses Wears Glasses: Reading only Patient Visual Report: No change from baseline;Other  (comment) Perception  Perception: Within Functional Limits Praxis Praxis: Intact Cognition Overall Cognitive Status: Within Functional Limits for tasks assessed Arousal/Alertness: Awake/alert Orientation Level: Oriented X4 Memory: Impaired Memory Impairment: Decreased short term memory Awareness: Appears intact Problem Solving: Appears intact Safety/Judgment: Impaired Sensation Sensation Light Touch: Appears Intact Stereognosis: Appears Intact Hot/Cold: Appears Intact Proprioception: Appears Intact Coordination Gross Motor Movements are Fluid and Coordinated: Yes Fine Motor Movements are Fluid and Coordinated: Yes Motor  Motor Motor - Discharge Observations: improved overall  Mobility  Transfers Transfers: Sit to Stand;Stand to Sit Sit to Stand: 6: Modified independent (Device/Increase time) Stand to Sit: 6: Modified independent (Device/Increase time)  Trunk/Postural Assessment  Cervical Assessment Cervical Assessment: Within Functional Limits Thoracic Assessment Thoracic Assessment: Within Functional Limits Lumbar Assessment Lumbar Assessment: Within Functional Limits Postural Control Postural Control: Within Functional Limits  Balance   Extremity/Trunk Assessment RUE Assessment RUE Assessment: Within Functional Limits LUE Assessment LUE Assessment: Within Functional Limits   See Function Navigator for Current Functional Status.  Willeen Cass [facility] 10/14/2016, 3:40 PM

## 2016-10-14 NOTE — Progress Notes (Signed)
Social Work  Discharge Note  The overall goal for the admission was met for:   Discharge location: Yes-HOME WITH BOYFRIEND-JUAN WHO IS THERE IN THE EVENINGS  Length of Stay: Yes-4 DAYS  Discharge activity level: Yes-INDEPENDENT LEVEL  Home/community participation: Yes  Services provided included: MD, RD, PT, OT, SLP, RN, Pharmacy and SW  Financial Services: Medicaid  Follow-up services arranged: Other: NO FOLLOW UP RECOMMENDED-HOME EXERCISE PROGRAM GIVEN TO PT  Comments (or additional information):PT DID VERY WELL AND REACHED INDEPENDENT LEVEL GOALS, WILL HAVE INTERMITTENT ASSIST. DECLINED SUBSTANCE ABUSE RESOURCES FELT NOT AN ISSUE PT WILL DO WHAT SHE WANTS TO DO WILL BE HIGH RISK FOR RE-ADMISSION TO THE HOSPITAL, VERY FORTUNATE SHE RECOVERED AS WELL AS SHE DID HERE.  Patient/Family verbalized understanding of follow-up arrangements: Yes  Individual responsible for coordination of the follow-up plan: SELF & JUAN-BOYFRIEND  Confirmed correct DME delivered: Dupree, Rebecca G 10/14/2016    Dupree, Rebecca G 

## 2016-10-14 NOTE — Discharge Summary (Signed)
[name], [name]        ACCOUNT NO.:  [id number]  MEDICAL RECORD NO.:  [id number]  LOCATION:                                 FACILITY:  PHYSICIAN:  Charlett Blake, M.D.   DATE OF BIRTH:  DATE OF ADMISSION:  10/11/2016 DATE OF DISCHARGE:  10/15/2016                              DISCHARGE SUMMARY   DISCHARGE DIAGNOSES: 1. Bilateral frontoparietal infarctions. 2. Subcutaneous Lovenox for DVT prophylaxis. 3. Anxiety. 4. Moderate stenosis, right carotid bifurcation. 5. Hypertension. 6. Diabetes mellitus. 7. Hyperlipidemia. 8. Tobacco abuse. 9. History of breast cancer.  HISTORY OF PRESENT ILLNESS:  This is a 56 year old right-handed female with history of hypertension, diabetes mellitus, tobacco abuse, who presented on October 09, 2016, with right-sided weakness and slurred speech.  She lives with her boyfriend, 1-level home.  Independent prior to admission.  Cranial CT scan showed no evidence of acute infarction. Blood pressure 88/40.  Urine drug screen positive for opiates and benzos.  MRI showed numerous acute small frontoparietal infarctions including small vessel in MCA territories.  MRI of cervical spine without neuro compression.  CT angiogram of head and neck showed no emergent large vessel occlusion, severe stenosis of left paraclinoid ICA, left anterior cerebral artery and left middle cerebral artery. Lower extremity Doppler is negative.  The patient did not receive tPA. Cerebral angiogram on October 10, 2016, showed approximately 70% stenosis of right MCA proximal M1 segment, approximately 50% stenosis of right VA origin.  Echocardiogram with ejection fraction of 65%.  No wall motion abnormalities.  Maintained on aspirin and Plavix for CVA prophylaxis. Subcutaneous Lovenox for DVT prophylaxis.  The patient was admitted for comprehensive rehab program.  PAST MEDICAL HISTORY:  See discharge diagnoses.  SOCIAL HISTORY:  She lives with boyfriend,  independent prior to admission.  FUNCTIONAL STATUS UPON ADMISSION TO REHAB SERVICES:  Minimal assist 70 feet, rolling walker; minimal assist sit to stand; min to mod assist activities of daily living.  PHYSICAL EXAMINATION:  VITAL SIGNS:  Blood pressure 162/81, pulse 80, temperature 98, and respirations 20. GENERAL:  Alert female, made good eye contact with examiner.  She provides her name, but uncertain of her date of birth.  Limited medical historian.  Followed simple commands.  EOMs intact. NECK:  Supple.  Nontender.  No JVD. CARDIAC:  Rate controlled. ABDOMEN:  Soft, nontender.  Good bowel sounds. LUNGS:  Clear to auscultation without wheeze.  REHABILITATION HOSPITAL COURSE:  The patient was admitted to Inpatient Rehab Services.  Therapies initiated on a 3-hour daily basis consisting of physical therapy, occupational therapy, speech therapy, and rehabilitation nursing.  The following issues were addressed during the patient's rehabilitation stay.  Pertaining to Mr. Debbrah Alar' bilateral frontoparietal infarctions, remained stable.  She continued on aspirin and Plavix therapy.  Subcutaneous Lovenox for DVT prophylaxis.  Chronic back pain, currently on low-dose OxyContin sustained release, oxycodone immediate release for breakthrough pain.  Xanax as needed for anxiety. Findings of moderate stenosis, right carotid bifurcation, left cavernous carotid.  Plan was followup outpatient.  Discussed with Neurology Services possible elective angioplasty versus stenting.  Also, she had some left vertebral stenosis.  Blood pressures monitored, permissive hypertension.  She would resume antihypertensive medications as needed. Blood sugars well controlled.  Hemoglobin A1c of 6.5.  The patient had been on Glucophage prior to admission.  The patient received weekly collaborative interdisciplinary team conferences to discuss estimated length of stay, family teaching, any barriers to discharge.  She  was supervision for ambulation without assistive device, working with dynamic balance, safety awareness.  She could gather her belongings for activities of daily living and homemaking.  Completed simulated tub shower transfers in the ADL apartment with supervision.  Plan was discharge to home.  DISCHARGE MEDICATIONS: 1. Aspirin 325 mg p.o. daily. 2. Lipitor 10 mg p.o. daily. 3. Plavix 75 mg p.o. daily. 4. OxyContin sustained release 10 mg every 12 hours. 5. Glucophage 500 mg twice daily. 6. Toprol 50 mg daily  FOLLOWUP:  Plan followup outpatient with Dr. Alysia Penna at the [facility] as directed; Antony Contras, Neurology Services in 6 weeks as well as discuss possible elective intervention for right carotid bifurcation, possible stenting; Selina Cooley, nurse practitioner, Medical Management.     Lauraine Rinne, P.A.   ______________________________ Charlett Blake, M.D.    DA/MEDQ  D:  10/14/2016  T:  10/14/2016  Job:  [id number]  cc:   Dr. Selina Cooley

## 2016-10-14 NOTE — Patient Care Conference (Signed)
Inpatient RehabilitationTeam Conference and Plan of Care Update Date: 10/14/2016   Time: 11:44 AM    Patient Name: [name]      Medical Record Number: [id number]  Date of Birth: 03/08/1960 Sex: Female         Room/Bed: 4W18C/4W18C-01 Payor Info: Payor: MEDICAID [place] / Plan: MEDICAID [place] ACCESS / Product Type: *No Product type* /    Admitting Diagnosis: B CVA  Admit Date/Time:  10/11/2016  5:58 PM Admission Comments: No comment available   Primary Diagnosis:  Acute bilat watershed infarction [facility]) Principal Problem: Acute bilat watershed infarction [facility])  Patient Active Problem List   Diagnosis Date Noted  . Acute bilat watershed infarction [facility]) 10/11/2016  . 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 Cerebral embolism with cerebral infarction 10/09/2016  . CVA (cerebral vascular accident) ([place]) 10/09/2016  . Chronic pain syndrome 10/09/2016  . Diabetes mellitus type 2 in obese ([place]) 10/09/2016  . AKI (acute kidney injury) ([place])   . Right-sided muscle weakness   . Eye drainage 02/28/2014  . Conjunctivitis 02/28/2014  . Back pain 02/28/2014  . Hyperlipidemia 12/07/2012  . Tobacco abuse 12/07/2012  . Psoriasis 12/13/2010  . COPD (chronic obstructive pulmonary disease) ([place]) 09/14/2010  . 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 H/O MRSA infection 09/14/2010  . Anxiety state 05/13/2009  . DEPRESSION 05/13/2009  . Essential hypertension 05/13/2009  . H/O Stage III Breast cancer, left 05/13/2009    Expected Discharge Date: Expected Discharge Date: 10/16/16  Team Members Present: Physician leading conference: Dr. Alysia Penna Social Worker Present: Ovidio Kin, LCSW Nurse Present: Isla Pence, RN PT Present: Barrie Folk, PT OT Present: Napoleon Form, OT SLP Present: Stormy Fabian, SLP PPS Coordinator present : Daiva Nakayama, RN, CRRN     Current Status/Progress Goal Weekly Team Focus  Medical   working on balance, BP well controlled, on chronic narcotics  PTA  educate on stroke risk reduction BP  determine diabetic needs on metformin at home but controlled on diet in hospital   Bowel/Bladder   Continent of bowel and bladder: LBM 10/12/16  Mod I  Notify RN of constipation issues   Swallow/Nutrition/ Hydration             ADL's   steadying A transfers, S self care  mod I with BADLs, S with IADLs  balance, activity tolerance, pt education   Mobility   min assist overall  mod I to supervision overall  balance, endurance, gait, strengthening, d/c planning   Communication             Safety/Cognition/ Behavioral Observations            Pain   c/o right shoulder pain, 15mg  Oxy IR q 4hr prn, 10mg  Oxycodone q 12hr  <4 on a 0-10 pain scale  assess pain q 4hr and prn   Skin   MASD to groin, thigh, and buttocks, apply Nystatin powder TID  Remain from further breakdown  assess skin q shift and prn      *See Care Plan and progress notes for long and short-term goals.     Barriers to Discharge  Current Status/Progress Possible Resolutions Date Resolved   Physician    Inaccessible home environment;Medication compliance     progressing toward d/c   shorten LOS, Mod I goals      Nursing  Decreased caregiver support;Lack of/limited family support;Medication compliance;Nutrition means  Patient was taking care of grandchildren PTA, doesn't have stable support system in place. Dietary compliance  for diabetic diet is going to be an issue because she eats a lot of junk food. Medication compliance is posslible another issue due to lack of possible transportation and funding problems.            PT                    OT                  SLP                SW Lack of/limited family support;Medication compliance Does not have 24 hr care and has substance abuse issues            Discharge Planning/Teaching Needs:    Home with her boyfriend who works during the day, but is home in the evenings.     Team Discussion:  Goals-independent level, doing  well and working on her endurance and activity tolerance. Medically stable and aware needs to follow up with MD's upon DC.  Revisions to Treatment Plan:  DC 8/26    Continued Need for Acute Rehabilitation Level of Care: The patient requires daily medical management by a physician with specialized training in physical medicine and rehabilitation for the following conditions: Daily direction of a multidisciplinary physical rehabilitation program to ensure safe treatment while eliciting the highest outcome that is of practical value to the patient.: Yes Daily medical management of patient stability for increased activity during participation in an intensive rehabilitation regime.: Yes Daily analysis of laboratory values and/or radiology reports with any subsequent need for medication adjustment of medical intervention for : Neurological problems;Diabetes problems;Blood pressure problems  [name], [name] 10/14/2016, 11:44 AM

## 2016-10-15 ENCOUNTER — Inpatient Hospital Stay (HOSPITAL_COMMUNITY): Payer: Medicaid Other

## 2016-10-15 ENCOUNTER — Inpatient Hospital Stay (HOSPITAL_COMMUNITY): Payer: Medicaid Other | Admitting: Physical Therapy

## 2016-10-15 LAB — BASIC METABOLIC PANEL
Anion gap: 8 (ref 5–15)
BUN: 7 mg/dL (ref 6–20)
CHLORIDE: 99 mmol/L — AB (ref 101–111)
CO2: 31 mmol/L (ref 22–32)
CREATININE: 0.85 mg/dL (ref 0.44–1.00)
Calcium: 9.5 mg/dL (ref 8.9–10.3)
Glucose, Bld: 141 mg/dL — ABNORMAL HIGH (ref 65–99)
POTASSIUM: 4.2 mmol/L (ref 3.5–5.1)
SODIUM: 138 mmol/L (ref 135–145)

## 2016-10-15 LAB — GLUCOSE, CAPILLARY: GLUCOSE-CAPILLARY: 142 mg/dL — AB (ref 65–99)

## 2016-10-15 NOTE — Progress Notes (Signed)
PRN oxy IR given at 2145. Ordered Pizza, ate one piece At 2344 C/O nausea, PRN zofran given. C/O itching all over, multi areas of yeast like rash. (Not new), paged Dr. Alain Marion for Benadryl PO, med given at 2354, with relief. BLE's with pitting edema. Anxious about going home today and wanting to smoke. Out of room several times, easily redirected back to room. Patrici Ranks A

## 2016-10-15 NOTE — Progress Notes (Signed)
[name] is a 56 y.o. female 10/04/1960 [id number]  Subjective: No new complaints. No new problems. Slept well. Feeling OK.  Objective: Vital signs in last 24 hours: Temp:  [98.3 F (36.8 C)-98.6 F (37 C)] 98.3 F (36.8 C) (08/25 0551) Pulse Rate:  [62-74] 62 (08/25 0551) Resp:  [16-18] 16 (08/25 0551) BP: (114-123)/(49-70) 114/49 (08/25 0551) SpO2:  [100 %] 100 % (08/25 0551) Weight change:  Last BM Date: 10/13/16  Intake/Output from previous day: 08/24 0701 - 08/25 0700 In: 360 [P.O.:360] Out: -  Last cbgs: CBG (last 3)   Recent Labs  10/14/16 1714 10/14/16 2153 10/15/16 0624  GLUCAP 120* 140* 142*     Physical Exam General: No apparent distress   HEENT: not dry Lungs: Normal effort. Lungs clear to auscultation, no crackles or wheezes. Cardiovascular: Regular rate and rhythm, no edema Abdomen: S/NT/ND; BS(+) Musculoskeletal:  unchanged Neurological: No new neurological deficits Wounds: N/A    Skin: clear  Aging changes Mental state: Alert, cooperative    Lab Results: BMET    Component Value Date/Time   NA 138 10/12/2016 0519   K 3.2 (L) 10/12/2016 0519   CL 103 10/12/2016 0519   CO2 29 10/12/2016 0519   GLUCOSE 117 (H) 10/12/2016 0519   BUN <5 (L) 10/12/2016 0519   CREATININE 0.63 10/12/2016 0519   CALCIUM 8.6 (L) 10/12/2016 0519   GFRNONAA >60 10/12/2016 0519   GFRAA >60 10/12/2016 0519   CBC    Component Value Date/Time   WBC 6.3 10/12/2016 0519   RBC 3.76 (L) 10/12/2016 0519   HGB 11.3 (L) 10/12/2016 0519   HCT 34.7 (L) 10/12/2016 0519   PLT 225 10/12/2016 0519   MCV 92.3 10/12/2016 0519   MCH 30.1 10/12/2016 0519   MCHC 32.6 10/12/2016 0519   RDW 14.9 10/12/2016 0519   LYMPHSABS 1.7 10/12/2016 0519   MONOABS 0.4 10/12/2016 0519   EOSABS 0.2 10/12/2016 0519   BASOSABS 0.0 10/12/2016 0519    Studies/Results: No results found.  Medications: I have reviewed the patient's current  medications.  Assessment/Plan:   1. 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 Pain control: Oxycontin 3. Anxiety: Xanax prn 4. HTN: Toprol, Hyzaar, Norvasc 5. DM - on Metformin 6. Dyslipidemia - Lipitor     Length of stay, days: 4  Walker Kehr , MD 10/15/2016, 9:26 AM

## 2016-10-15 NOTE — Progress Notes (Signed)
1015 10/15/16 nursing Patient was very eager to be  discharged to home. Patient has attempted  several times to get out of the unit. Patient was directable  claims she wants to smoke at one point   A  friend came to get her. RN told companion that her medications from pharmacy was with the patient and was counted with the patient together with another RN./NT.

## 2016-10-16 ENCOUNTER — Encounter (HOSPITAL_COMMUNITY): Payer: Self-pay | Admitting: Nurse Practitioner

## 2016-10-16 ENCOUNTER — Inpatient Hospital Stay (HOSPITAL_COMMUNITY): Payer: Medicaid Other

## 2016-10-16 DIAGNOSIS — Z79899 Other long term (current) drug therapy: Secondary | ICD-10-CM | POA: Insufficient documentation

## 2016-10-16 DIAGNOSIS — Z7984 Long term (current) use of oral hypoglycemic drugs: Secondary | ICD-10-CM | POA: Insufficient documentation

## 2016-10-16 DIAGNOSIS — Z8673 Personal history of transient ischemic attack (TIA), and cerebral infarction without residual deficits: Secondary | ICD-10-CM | POA: Diagnosis not present

## 2016-10-16 DIAGNOSIS — J45909 Unspecified asthma, uncomplicated: Secondary | ICD-10-CM | POA: Diagnosis not present

## 2016-10-16 DIAGNOSIS — F17210 Nicotine dependence, cigarettes, uncomplicated: Secondary | ICD-10-CM | POA: Insufficient documentation

## 2016-10-16 DIAGNOSIS — E119 Type 2 diabetes mellitus without complications: Secondary | ICD-10-CM | POA: Insufficient documentation

## 2016-10-16 DIAGNOSIS — J449 Chronic obstructive pulmonary disease, unspecified: Secondary | ICD-10-CM | POA: Insufficient documentation

## 2016-10-16 DIAGNOSIS — R197 Diarrhea, unspecified: Secondary | ICD-10-CM | POA: Insufficient documentation

## 2016-10-16 DIAGNOSIS — R112 Nausea with vomiting, unspecified: Secondary | ICD-10-CM | POA: Diagnosis not present

## 2016-10-16 DIAGNOSIS — I10 Essential (primary) hypertension: Secondary | ICD-10-CM | POA: Insufficient documentation

## 2016-10-16 LAB — COMPREHENSIVE METABOLIC PANEL
ALK PHOS: 68 U/L (ref 38–126)
ALT: 27 U/L (ref 14–54)
AST: 26 U/L (ref 15–41)
Albumin: 3.8 g/dL (ref 3.5–5.0)
Anion gap: 9 (ref 5–15)
BILIRUBIN TOTAL: 0.5 mg/dL (ref 0.3–1.2)
BUN: 10 mg/dL (ref 6–20)
CALCIUM: 10.3 mg/dL (ref 8.9–10.3)
CO2: 31 mmol/L (ref 22–32)
Chloride: 100 mmol/L — ABNORMAL LOW (ref 101–111)
Creatinine, Ser: 0.77 mg/dL (ref 0.44–1.00)
Glucose, Bld: 158 mg/dL — ABNORMAL HIGH (ref 65–99)
Potassium: 3.9 mmol/L (ref 3.5–5.1)
Sodium: 140 mmol/L (ref 135–145)
Total Protein: 8 g/dL (ref 6.5–8.1)

## 2016-10-16 LAB — CBC
HCT: 41.2 % (ref 36.0–46.0)
Hemoglobin: 13.4 g/dL (ref 12.0–15.0)
MCH: 29.7 pg (ref 26.0–34.0)
MCHC: 32.5 g/dL (ref 30.0–36.0)
MCV: 91.4 fL (ref 78.0–100.0)
PLATELETS: 428 10*3/uL — AB (ref 150–400)
RBC: 4.51 MIL/uL (ref 3.87–5.11)
RDW: 14.6 % (ref 11.5–15.5)
WBC: 10.5 10*3/uL (ref 4.0–10.5)

## 2016-10-16 LAB — LIPASE, BLOOD: LIPASE: 25 U/L (ref 11–51)

## 2016-10-16 MED ORDER — SODIUM CHLORIDE 0.9 % IV BOLUS (SEPSIS)
1000.0000 mL | Freq: Once | INTRAVENOUS | Status: AC
  Administered 2016-10-17: 1000 mL via INTRAVENOUS

## 2016-10-16 MED ORDER — ONDANSETRON 8 MG PO TBDP
8.0000 mg | ORAL_TABLET | Freq: Once | ORAL | Status: AC
  Administered 2016-10-17: 8 mg via ORAL
  Filled 2016-10-16: qty 1

## 2016-10-16 MED ORDER — SODIUM CHLORIDE 0.9 % IV SOLN: 1000.0000 mL | INTRAVENOUS | Status: DC

## 2016-10-16 NOTE — ED Triage Notes (Signed)
Pt has brought in by EMS per her social worker request for evaluation of nausea and vomiting. Recent hospitalization for CVA, reportedly was been unable to fill her nausea medication post discharge.

## 2016-10-17 ENCOUNTER — Emergency Department (HOSPITAL_COMMUNITY)
Admission: EM | Admit: 2016-10-17 | Discharge: 2016-10-17 | Disposition: A | Discharge status: Home / Self Care | Payer: Medicaid Other | Attending: Emergency Medicine | Admitting: Emergency Medicine

## 2016-10-17 ENCOUNTER — Inpatient Hospital Stay (HOSPITAL_COMMUNITY): Payer: Medicaid Other | Admitting: Occupational Therapy

## 2016-10-17 DIAGNOSIS — R112 Nausea with vomiting, unspecified: Secondary | ICD-10-CM

## 2016-10-17 MED ORDER — TRAMADOL HCL 50 MG PO TABS
50.0000 mg | ORAL_TABLET | Freq: Once | ORAL | Status: AC
  Administered 2016-10-17: 50 mg via ORAL
  Filled 2016-10-17: qty 1

## 2016-10-17 MED ORDER — ONDANSETRON 8 MG PO TBDP: 8.0000 mg | ORAL_TABLET | Freq: Three times a day (TID) | ORAL | 0 refills | Status: DC | PRN

## 2016-10-17 MED ORDER — METOCLOPRAMIDE HCL 5 MG/ML IJ SOLN
10.0000 mg | Freq: Once | INTRAMUSCULAR | Status: AC
  Administered 2016-10-17: 10 mg via INTRAVENOUS
  Filled 2016-10-17: qty 2

## 2016-10-17 NOTE — ED Provider Notes (Signed)
[facility] DEPT Provider Note   CSN: [id number] Arrival date & time: 10/16/16  1811     History   Chief Complaint Chief Complaint  Patient presents with  . Nausea  . Emesis    HPI [name] is a 56 y.o. female.  HPI Patient is a 56 year old female who presents emergency department with nausea vomiting and one episode of diarrhea.  She was recently hospitalized and discharged yesterday after a recent stroke.  She denies any new neurologic symptoms.  She denies abdominal pain.  No chest pain shortness of breath.  She reports mild decreased oral intake today and feels slightly weak.  No dysuria or urinary frequency.  Denies hematemesis.  No melena or hematochezia.  Symptoms are mild in severity   Past Medical History:  Diagnosis Date  . Asthma   . Back muscle spasm 12/13/2010  . Cancer [facility])    breast cancer, remission 2010  . Chronic back pain   . Chronic foot pain   . COPD (chronic obstructive pulmonary disease) ([place]) 09/14/2010  . H/O MRSA infection 09/14/2010  . High blood cholesterol   . Hyperlipidemia 12/07/2012  . Hypertension   . 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 Psoriasis 12/13/2010  . Tobacco abuse 12/07/2012    Patient Active Problem List   Diagnosis Date Noted  . Acute bilat watershed infarction [facility]) 10/11/2016  . 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 Cerebral embolism with cerebral infarction 10/09/2016  . CVA (cerebral vascular accident) ([place]) 10/09/2016  . Chronic pain syndrome 10/09/2016  . Diabetes mellitus type 2 in obese ([place]) 10/09/2016  . AKI (acute kidney injury) ([place])   . Right-sided muscle weakness   . Eye drainage 02/28/2014  . Conjunctivitis 02/28/2014  . Back pain 02/28/2014  . Hyperlipidemia 12/07/2012  . Tobacco abuse 12/07/2012  . Psoriasis 12/13/2010  . COPD (chronic obstructive pulmonary disease) ([place]) 09/14/2010  . 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 H/O MRSA infection 09/14/2010  . Anxiety state  05/13/2009  . DEPRESSION 05/13/2009  . Essential hypertension 05/13/2009  . H/O Stage III Breast cancer, left 05/13/2009    Past Surgical History:  Procedure Laterality Date  . ABDOMINAL HYSTERECTOMY    . CESAREAN SECTION     x7  . IR ANGIO INTRA EXTRACRAN SEL COM CAROTID INNOMINATE BILAT MOD SED  10/10/2016  . IR ANGIO VERTEBRAL SEL SUBCLAVIAN INNOMINATE UNI R MOD SED  10/10/2016  . IR ANGIOGRAM EXTREMITY LEFT  10/10/2016  . MASTECTOMY     left  . MASTECTOMY    . PORT-A-CATH REMOVAL  06/01/2011   Procedure: REMOVAL PORT-A-CATH;  Surgeon: Donato Heinz, MD;  Location: [facility];  Service: General;  Laterality: N/A;  Minor Room  . portacath insertion      OB History    No data available       Home Medications    Prior to Admission medications   Medication Sig Start Date End Date Taking? Authorizing Provider  ALPRAZolam Duanne Moron) 1 MG tablet Take 1 tablet (1 mg total) by mouth 2 (two) times daily as needed. for anxiety 10/14/16  Yes Angiulli, Lavon Paganini, PA-C  aspirin 325 MG tablet Take 1 tablet (325 mg total) by mouth daily. 10/12/16  Yes Velvet Bathe, MD  atorvastatin (LIPITOR) 10 MG tablet Take 1 tablet (10 mg total) by mouth daily at 6 PM. 10/14/16  Yes Angiulli, Lavon Paganini, PA-C  clopidogrel (PLAVIX) 75 MG tablet Take 1 tablet (75 mg total) by mouth daily.  10/14/16  Yes Angiulli, Lavon Paganini, PA-C  metFORMIN (GLUCOPHAGE) 500 MG tablet Take 1 tablet (500 mg total) by mouth 2 (two) times daily with a meal. 10/14/16  Yes Angiulli, Lavon Paganini, PA-C  metoprolol succinate (TOPROL-XL) 50 MG 24 hr tablet Take 1 tablet (50 mg total) by mouth daily. Take with or immediately following a meal. 10/14/16  Yes Angiulli, Lavon Paganini, PA-C  Olopatadine HCl (PATADAY) 0.2 % SOLN Place 1 drop into both eyes daily.   Yes [provider]  oxyCODONE (OXYCONTIN) 10 mg 12 hr tablet Take 1 tablet (10 mg total) by mouth every 12 (twelve) hours. 10/14/16  Yes Angiulli, Lavon Paganini, PA-C  oxyCODONE (ROXICODONE) 15 MG  immediate release tablet Take 1 tablet (15 mg total) by mouth every 4 (four) hours as needed for pain. 10/14/16  Yes Angiulli, Lavon Paganini, PA-C  ondansetron (ZOFRAN ODT) 8 MG disintegrating tablet Take 1 tablet (8 mg total) by mouth every 8 (eight) hours as needed for nausea or vomiting. 10/17/16   Jola Schmidt, MD    Family History Family History  Problem Relation Age of Onset  . Stroke Mother   . Stroke Father   . Cancer Maternal Aunt   . Cancer Maternal Grandmother   . Cancer Maternal Aunt   . Cancer Maternal Aunt     Social History Social History  Substance Use Topics  . Smoking status: Current Every Day Smoker    Packs/day: 0.25    Years: 36.00  . Smokeless tobacco: Never Used  . Alcohol use Yes     Comment: jack and coke     Allergies   Codeine and Penicillins   Review of Systems Review of Systems  All other systems reviewed and are negative.    Physical Exam Updated Vital Signs BP (!) 179/89 (BP Location: Left Arm)   Pulse 88   Temp 98.5 F (36.9 C) (Oral)   Resp 20   SpO2 96%   Physical Exam  Constitutional: She is oriented to person, place, and time. She appears well-developed and well-nourished. No distress.  HENT:  Head: Normocephalic and atraumatic.  Eyes: EOM are normal.  Neck: Normal range of motion.  Cardiovascular: Normal rate and regular rhythm.   Pulmonary/Chest: Effort normal and breath sounds normal.  Abdominal: Soft. She exhibits no distension. There is no tenderness.  Musculoskeletal: Normal range of motion.  Neurological: She is alert and oriented to person, place, and time.  Skin: Skin is warm and dry.  Psychiatric: She has a normal mood and affect. Judgment normal.  Nursing note and vitals reviewed.    ED Treatments / Results  Labs (all labs ordered are listed, but only abnormal results are displayed) Labs Reviewed  COMPREHENSIVE METABOLIC PANEL - Abnormal; Notable for the following:       Result Value   Chloride 100 (*)     Glucose, Bld 158 (*)    All other components within normal limits  CBC - Abnormal; Notable for the following:    Platelets 428 (*)    All other components within normal limits  LIPASE, BLOOD  URINALYSIS, ROUTINE W REFLEX MICROSCOPIC    EKG  EKG Interpretation None       Radiology No results found.  Procedures Procedures (including critical care time)  Medications Ordered in ED Medications  sodium chloride 0.9 % bolus 1,000 mL (1,000 mLs Intravenous New Bag/Given 10/17/16 0036)    Followed by  0.9 %  sodium chloride infusion (not administered)  ondansetron (ZOFRAN-ODT) disintegrating  tablet 8 mg (8 mg Oral Given 10/17/16 0035)  traMADol (ULTRAM) tablet 50 mg (50 mg Oral Given 10/17/16 0102)     Initial Impression / Assessment and Plan / ED Course  I have reviewed the triage vital signs and the nursing notes.  Pertinent labs & imaging results that were available during my care of the patient were reviewed by me and considered in my medical decision making (see chart for details).     1:49 AM Patient feels much better at this time.  Repeat abdominal exam is without tenderness.  Keeping fluids down.  Nausea controlled.  Home with Zofran.  Primary care follow-up.  She understands to return to the ER for new or worsening symptoms  Final Clinical Impressions(s) / ED Diagnoses     New Prescriptions New Prescriptions   ONDANSETRON (ZOFRAN ODT) 8 MG DISINTEGRATING TABLET    Take 1 tablet (8 mg total) by mouth every 8 (eight) hours as needed for nausea or vomiting.     Jola Schmidt, MD 10/17/16 [phone]

## 2016-10-17 NOTE — ED Notes (Signed)
No respiratory or acute distress noted alert and oriented x 3 call light in reach no reaction to medication noted. 

## 2016-10-19 ENCOUNTER — Other Ambulatory Visit: Payer: Self-pay

## 2016-10-19 NOTE — Patient Outreach (Signed)
[facility]) Care Management  10/19/2016  [name] 1960-10-29 [id number]   RED ON EMMI ALERT Day #  1  Date: 10/18/16 Red Alert Reason: Scheduled follow up appointment-no Problems setting up rehab-yes   Outreach attempt # 1 Telephone call to patient.  No answer.  HIPAA compliant voice message left.     Plan: RN CM will attempt patient again on the next business day.  Jone Baseman, RN, MSN [place] [phone]

## 2016-10-20 ENCOUNTER — Other Ambulatory Visit: Payer: Self-pay

## 2016-10-20 NOTE — Patient Outreach (Signed)
[facility]) Care Management  10/20/2016  Mayline Dragon Bansal April 19, 1960 [id number]   RED ON EMMI ALERT Day #  1 Date: 10/18/2016 Red Alert Reason: Scheduled Follow up appointment-no Problems setting up rehab-yes    Outreach attempt # 2 Telephone call to patient for EMMI follow up.  No answer.  HIPAA compliant voice message left.      Plan: RN CM will attempt patient again on the next business day.    Jone Baseman, RN, MSN [place] [phone]

## 2016-10-21 ENCOUNTER — Other Ambulatory Visit: Payer: Self-pay

## 2016-10-21 NOTE — Patient Outreach (Signed)
[facility]) Care Management  10/21/2016  [name] 1960/07/14 [id number]   RED ON EMMI ALERT Day # 1 Date: 10-18-16 Red Alert Reason: Scheduled follow up appointment? No Problems setting up rehab? yes   Outreach attempt # 3 Telephone call to patient.  Unable to leave a message.  States voicemail full.     Plan: RN CM will close case.  RN CM will notify care management assistant of case status.   Jone Baseman, RN, MSN [name] [phone]

## 2016-10-27 ENCOUNTER — Other Ambulatory Visit: Payer: Self-pay

## 2016-10-27 NOTE — Patient Outreach (Signed)
[facility]) Care Management  10/27/2016  [name] 12-28-1960 [id number]   RED ON EMMI ALERT Day # 9 Date: 10/26/2016 Red Alert Reason: Lost interest in things they used to enjoy- yes Sad, hopeless, anxious, or empty-yes   Outreach attempt # 1   Telephone call to patient for EMMI red follow up.  She lives with her boyfriend of 16 years.  She states that he helps where he can but he works full time.Patient reports feeling empty but not depressed.  Patient states that she feels alone.  Asked patient if she felt like harming herself she states no and that she believes in God and that he is her help.  Asked patient if she had family support.  She states she does have a brother and sister but they have their own lives.   Asked patient about her primary doctor.  She states that she is to see Dr. Alphonzo Grieve at the clinic on [facility] on Monday and that Selina Cooley is not her primary care physician.  Advised patient to talk with her physician next week about how she is feeling as she may need something for depression.  She verbalized understanding. Asked patient about transportation. She states she utilizes social services for transportation and she also has some family and friends who can take her as well.     Plan: RN CM will contact physician to notify of patient status. RN CM will close the case at this time as patient is medicaid.   RN CM will notify care management assistant of case status.   Telephone call to Dr. Ailene Rud office to notify of patient status. Left message with Faustino Congress in behavioral health.    Jone Baseman, RN, MSN [place] [phone]

## 2016-10-28 ENCOUNTER — Ambulatory Visit (HOSPITAL_COMMUNITY): Payer: Medicaid Other | Admitting: Oncology

## 2016-11-01 ENCOUNTER — Inpatient Hospital Stay: Payer: Medicaid Other | Admitting: Physical Medicine & Rehabilitation

## 2016-11-17 ENCOUNTER — Ambulatory Visit (HOSPITAL_COMMUNITY): Payer: Medicaid Other

## 2016-11-17 ENCOUNTER — Ambulatory Visit (INDEPENDENT_AMBULATORY_CARE_PROVIDER_SITE_OTHER): Payer: Medicaid Other | Admitting: Neurology

## 2016-11-17 ENCOUNTER — Encounter: Payer: Self-pay | Admitting: Neurology

## 2016-11-17 ENCOUNTER — Encounter (INDEPENDENT_AMBULATORY_CARE_PROVIDER_SITE_OTHER): Payer: Self-pay

## 2016-11-17 VITALS — BP 128/74 | HR 69 | Ht 63.0 in | Wt 185.0 lb

## 2016-11-17 DIAGNOSIS — G458 Other transient cerebral ischemic attacks and related syndromes: Secondary | ICD-10-CM | POA: Diagnosis not present

## 2016-11-17 NOTE — Patient Instructions (Addendum)
I had a long d/w patient and her friend about her recent strokes, subclavian steal, risk for recurrent stroke/TIAs, personally independently reviewed imaging studies and stroke evaluation results and answered questions.Continue aspirin 325 mg daily and clopidogrel 75 mg daily  for secondary stroke prevention and maintain strict control of hypertension with blood pressure goal below 130/90, diabetes with hemoglobin A1c goal below 6.5% and lipids with LDL cholesterol goal below 70 mg/dL. I also advised the patient to eat a healthy diet with plenty of whole grains, cereals, fruits and vegetables, exercise regularly and maintain ideal body weight . I have counseled the patient to quit smoking completely. She was advised to use a walker when walking long distances for fall and safety precautions. I will refer her to Dr. Estanislado Pandy for elective left subclavian angioplasty/stenting Followup in the future with my nurse practitioner in 3 months or call earlier if necessary.  Stroke Prevention Some medical conditions and behaviors are associated with an increased chance of having a stroke. You may prevent a stroke by making healthy choices and managing medical conditions. How can I reduce my risk of having a stroke?  Stay physically active. Get at least 30 minutes of activity on most or all days.  Do not smoke. It may also be helpful to avoid exposure to secondhand smoke.  Limit alcohol use. Moderate alcohol use is considered to be: ? No more than 2 drinks per day for men. ? No more than 1 drink per day for nonpregnant women.  Eat healthy foods. This involves: ? Eating 5 or more servings of fruits and vegetables a day. ? Making dietary changes that address high blood pressure (hypertension), high cholesterol, diabetes, or obesity.  Manage your cholesterol levels. ? Making food choices that are high in fiber and low in saturated fat, trans fat, and cholesterol may control cholesterol levels. ? Take any  prescribed medicines to control cholesterol as directed by your health care provider.  Manage your diabetes. ? Controlling your carbohydrate and sugar intake is recommended to manage diabetes. ? Take any prescribed medicines to control diabetes as directed by your health care provider.  Control your hypertension. ? Making food choices that are low in salt (sodium), saturated fat, trans fat, and cholesterol is recommended to manage hypertension. ? Ask your health care provider if you need treatment to lower your blood pressure. Take any prescribed medicines to control hypertension as directed by your health care provider. ? If you are 35-28 years of age, have your blood pressure checked every 3-5 years. If you are 4 years of age or older, have your blood pressure checked every year.  Maintain a healthy weight. ? Reducing calorie intake and making food choices that are low in sodium, saturated fat, trans fat, and cholesterol are recommended to manage weight.  Stop drug abuse.  Avoid taking birth control pills. ? Talk to your health care provider about the risks of taking birth control pills if you are over 69 years old, smoke, get migraines, or have ever had a blood clot.  Get evaluated for sleep disorders (sleep apnea). ? Talk to your health care provider about getting a sleep evaluation if you snore a lot or have excessive sleepiness.  Take medicines only as directed by your health care provider. ? For some people, aspirin or blood thinners (anticoagulants) are helpful in reducing the risk of forming abnormal blood clots that can lead to stroke. If you have the irregular heart rhythm of atrial fibrillation, you should be on  a blood thinner unless there is a good reason you cannot take them. ? Understand all your medicine instructions.  Make sure that other conditions (such as anemia or atherosclerosis) are addressed. Get help right away if:  You have sudden weakness or numbness of the  face, arm, or leg, especially on one side of the body.  Your face or eyelid droops to one side.  You have sudden confusion.  You have trouble speaking (aphasia) or understanding.  You have sudden trouble seeing in one or both eyes.  You have sudden trouble walking.  You have dizziness.  You have a loss of balance or coordination.  You have a sudden, severe headache with no known cause.  You have new chest pain or an irregular heartbeat. Any of these symptoms may represent a serious problem that is an emergency. Do not wait to see if the symptoms will go away. Get medical help at once. Call your local emergency services (911 in [place] Do not drive yourself to the hospital. This information is not intended to replace advice given to you by your health care provider. Make sure you discuss any questions you have with your health care provider. Document Released: 03/17/2004 Document Revised: 07/16/2015 Document Reviewed: 08/10/2012 Elsevier Interactive Patient Education  2017 [employer].
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
## 2016-11-22 ENCOUNTER — Telehealth (HOSPITAL_COMMUNITY): Payer: Self-pay

## 2016-11-22 ENCOUNTER — Other Ambulatory Visit: Payer: Self-pay | Admitting: Neurology

## 2016-11-22 ENCOUNTER — Other Ambulatory Visit (HOSPITAL_COMMUNITY): Payer: Self-pay | Admitting: Interventional Radiology

## 2016-11-22 DIAGNOSIS — G458 Other transient cerebral ischemic attacks and related syndromes: Secondary | ICD-10-CM

## 2016-11-22 NOTE — Telephone Encounter (Signed)
Called to schedule consult. [name] was in an interview. She will return call. AW

## 2016-11-23 ENCOUNTER — Other Ambulatory Visit (HOSPITAL_COMMUNITY): Payer: Self-pay | Admitting: Interventional Radiology

## 2016-11-23 DIAGNOSIS — I771 Stricture of artery: Secondary | ICD-10-CM

## 2016-12-06 ENCOUNTER — Encounter (HOSPITAL_COMMUNITY): Payer: Self-pay

## 2016-12-06 ENCOUNTER — Encounter (HOSPITAL_COMMUNITY)
Admission: RE | Admit: 2016-12-06 | Discharge: 2016-12-06 | Disposition: A | Discharge status: Home / Self Care | Payer: Medicaid Other | Source: Ambulatory Visit | Attending: Interventional Radiology | Admitting: Interventional Radiology

## 2016-12-06 ENCOUNTER — Other Ambulatory Visit: Payer: Self-pay | Admitting: Radiology

## 2016-12-06 DIAGNOSIS — Z885 Allergy status to narcotic agent status: Secondary | ICD-10-CM | POA: Insufficient documentation

## 2016-12-06 DIAGNOSIS — Z7984 Long term (current) use of oral hypoglycemic drugs: Secondary | ICD-10-CM | POA: Diagnosis not present

## 2016-12-06 DIAGNOSIS — F17210 Nicotine dependence, cigarettes, uncomplicated: Secondary | ICD-10-CM | POA: Diagnosis not present

## 2016-12-06 DIAGNOSIS — Z79899 Other long term (current) drug therapy: Secondary | ICD-10-CM | POA: Insufficient documentation

## 2016-12-06 DIAGNOSIS — I10 Essential (primary) hypertension: Secondary | ICD-10-CM | POA: Diagnosis not present

## 2016-12-06 DIAGNOSIS — Z79891 Long term (current) use of opiate analgesic: Secondary | ICD-10-CM | POA: Insufficient documentation

## 2016-12-06 DIAGNOSIS — Z7982 Long term (current) use of aspirin: Secondary | ICD-10-CM | POA: Diagnosis not present

## 2016-12-06 DIAGNOSIS — Z8673 Personal history of transient ischemic attack (TIA), and cerebral infarction without residual deficits: Secondary | ICD-10-CM | POA: Insufficient documentation

## 2016-12-06 DIAGNOSIS — Z880 Allergy status to penicillin: Secondary | ICD-10-CM | POA: Insufficient documentation

## 2016-12-06 DIAGNOSIS — Z853 Personal history of malignant neoplasm of breast: Secondary | ICD-10-CM | POA: Insufficient documentation

## 2016-12-06 DIAGNOSIS — Z01812 Encounter for preprocedural laboratory examination: Secondary | ICD-10-CM | POA: Diagnosis not present

## 2016-12-06 DIAGNOSIS — E119 Type 2 diabetes mellitus without complications: Secondary | ICD-10-CM | POA: Insufficient documentation

## 2016-12-06 DIAGNOSIS — J449 Chronic obstructive pulmonary disease, unspecified: Secondary | ICD-10-CM | POA: Diagnosis not present

## 2016-12-06 LAB — PROTIME-INR
INR: 0.99
PROTHROMBIN TIME: 13 s (ref 11.4–15.2)

## 2016-12-06 LAB — CBC WITH DIFFERENTIAL/PLATELET
BASOS ABS: 0 10*3/uL (ref 0.0–0.1)
BASOS PCT: 0 %
EOS ABS: 0.1 10*3/uL (ref 0.0–0.7)
EOS PCT: 1 %
HCT: 38.5 % (ref 36.0–46.0)
Hemoglobin: 12.5 g/dL (ref 12.0–15.0)
Lymphocytes Relative: 25 %
Lymphs Abs: 2.5 10*3/uL (ref 0.7–4.0)
MCH: 29.8 pg (ref 26.0–34.0)
MCHC: 32.5 g/dL (ref 30.0–36.0)
MCV: 91.9 fL (ref 78.0–100.0)
MONO ABS: 0.4 10*3/uL (ref 0.1–1.0)
MONOS PCT: 4 %
NEUTROS ABS: 7 10*3/uL (ref 1.7–7.7)
Neutrophils Relative %: 70 %
PLATELETS: 324 10*3/uL (ref 150–400)
RBC: 4.19 MIL/uL (ref 3.87–5.11)
RDW: 15 % (ref 11.5–15.5)
WBC: 10.1 10*3/uL (ref 4.0–10.5)

## 2016-12-06 LAB — BASIC METABOLIC PANEL
ANION GAP: 10 (ref 5–15)
BUN: 12 mg/dL (ref 6–20)
CALCIUM: 9.9 mg/dL (ref 8.9–10.3)
CO2: 26 mmol/L (ref 22–32)
CREATININE: 0.98 mg/dL (ref 0.44–1.00)
Chloride: 102 mmol/L (ref 101–111)
GFR calc Af Amer: >60 mL/min (ref >60)
GLUCOSE: 126 mg/dL — AB (ref 65–99)
Potassium: 3.9 mmol/L (ref 3.5–5.1)
Sodium: 138 mmol/L (ref 135–145)

## 2016-12-06 LAB — PLATELET INHIBITION P2Y12: PLATELET FUNCTION P2Y12: 237 [PRU] (ref 194–418)

## 2016-12-06 LAB — APTT: APTT: 27 s (ref 24–36)

## 2016-12-06 LAB — GLUCOSE, CAPILLARY: Glucose-Capillary: 119 mg/dL — ABNORMAL HIGH (ref 65–99)

## 2016-12-06 NOTE — Progress Notes (Addendum)
POE:[name], Ralene Bathe, MD  Cardiologist: pt is not sure, she does not think so  EKG: 10/09/16 in EPIC  Stress test: 5+ years ago  ECHO: 10/10/16 in EPIC  Cardiac Cath: pt denies  Chest x-ray: 10/08/16 in EPIC  Patient cannot read. She has learning disabilities.  Instructions given to her and a friend who can read and write who said she would be sure that she follows instructions.

## 2016-12-06 NOTE — Pre-Procedure Instructions (Signed)
[name]  12/06/2016      [facility] - [place], [place] [address] Phone: [phone] Fax: [phone]    Your procedure is scheduled on December 08, 2016.  Report to [facility]mitting at [place] AM.  Call this number if you have problems the morning of surgery:  [phone]   Remember:  Do not eat food or drink liquids after midnight.  Take these medicines the morning of surgery with A SIP OF WATER  Alprazolam (xanax), aspirin 325 mg, clopidogrel (plavix), metoprolol succinate (toprol XL), eye drops, ondansetron (zofran)-if needed, oxycodone (roxicodone)-if needed for pain.  Continue aspirin and plavix as instructed by your surgeon.  Starting now STOP taking any Aleve, Naproxen, Ibuprofen, Motrin, Advil, Goody's, BC's, all herbal medications, fish oil, and all vitamins      WHAT DO I DO ABOUT MY DIABETES MEDICATION?   Marland Kitchen Do not take oral diabetes medicines (pills) the morning of surgery.   Reviewed and Endorsed by [facility] Patient Education Committee, August 2015   How to Manage Your Diabetes Before and After Surgery  Why is it important to control my blood sugar before and after surgery? . Improving blood sugar levels before and after surgery helps healing and can limit problems. . A way of improving blood sugar control is eating a healthy diet by: o  Eating less sugar and carbohydrates o  Increasing activity/exercise o  Talking with your doctor about reaching your blood sugar goals . High blood sugars (greater than 180 mg/dL) can raise your risk of infections and slow your recovery, so you will need to focus on controlling your diabetes during the weeks before surgery. . Make sure that the doctor who takes care of your diabetes knows about your planned surgery including the date and location.  How do I manage my blood sugar before surgery? . Check your blood sugar at least 4 times a  day, starting 2 days before surgery, to make sure that the level is not too high or low. o Check your blood sugar the morning of your surgery when you wake up and every 2 hours until you get to the Short Stay unit. . If your blood sugar is less than 70 mg/dL, you will need to treat for low blood sugar: o Do not take insulin. o Treat a low blood sugar (less than 70 mg/dL) with  cup of clear juice (cranberry or apple), 4 glucose tablets, OR glucose gel. o Recheck blood sugar in 15 minutes after treatment (to make sure it is greater than 70 mg/dL). If your blood sugar is not greater than 70 mg/dL on recheck, call [phone] for further instructions. . Report your blood sugar to the short stay nurse when you get to Short Stay.  . If you are admitted to the hospital after surgery: o Your blood sugar will be checked by the staff and you will probably be given insulin after surgery (instead of oral diabetes medicines) to make sure you have good blood sugar levels. o The goal for blood sugar control after surgery is 80-180 mg/dL.  Continue all other medications as instructed by your physician except follow the above medication instructions before surgery  Do not wear jewelry, make-up or nail polish.  Do not wear lotions, powders, or perfumes, or deoderant.  Do not shave 48 hours prior to surgery.  Men may shave face and neck.  Do not bring valuables to  the hospital.  [facility] is not responsible for any belongings or valuables.  Contacts, dentures or bridgework may not be worn into surgery.  Leave your suitcase in the car.  After surgery it may be brought to your room.  For patients admitted to the hospital, discharge time will be determined by your treatment team.  Patients discharged the day of surgery will not be allowed to drive home.   Special instructions:   [place]- Preparing For Surgery  Before surgery, you can play an important role. Because skin is not sterile, your skin needs  to be as free of germs as possible. You can reduce the number of germs on your skin by washing with CHG (chlorahexidine gluconate) Soap before surgery.  CHG is an antiseptic cleaner which kills germs and bonds with the skin to continue killing germs even after washing.  Please do not use if you have an allergy to CHG or antibacterial soaps. If your skin becomes reddened/irritated stop using the CHG.  Do not shave (including legs and underarms) for at least 48 hours prior to first CHG shower. It is OK to shave your face.  Please follow these instructions carefully.   1. Shower the NIGHT BEFORE SURGERY and the MORNING OF SURGERY with CHG.   2. If you chose to wash your hair, wash your hair first as usual with your normal shampoo.  3. After you shampoo, rinse your hair and body thoroughly to remove the shampoo.  4. Use CHG as you would any other liquid soap. You can apply CHG directly to the skin and wash gently with a scrungie or a clean washcloth.   5. Apply the CHG Soap to your body ONLY FROM THE NECK DOWN.  Do not use on open wounds or open sores. Avoid contact with your eyes, ears, mouth and genitals (private parts). Wash Face and genitals (private parts)  with your normal soap.  6. Wash thoroughly, paying special attention to the area where your surgery will be performed.  7. Thoroughly rinse your body with warm water from the neck down.  8. DO NOT shower/wash with your normal soap after using and rinsing off the CHG Soap.  9. Pat yourself dry with a CLEAN TOWEL.  10. Wear CLEAN PAJAMAS to bed the night before surgery, wear comfortable clothes the morning of surgery  11. Place CLEAN SHEETS on your bed the night of your first shower and DO NOT SLEEP WITH PETS.    Day of Surgery: Do not apply any deodorants/lotions. Please wear clean clothes to the hospital/surgery center.     Please read over the following fact sheets that you were given. Coughing and Deep Breathing and Surgical  Site Infection Prevention

## 2016-12-07 ENCOUNTER — Ambulatory Visit (HOSPITAL_COMMUNITY): Payer: Medicaid Other

## 2016-12-07 ENCOUNTER — Inpatient Hospital Stay (HOSPITAL_COMMUNITY): Admission: RE | Admit: 2016-12-07 | Payer: Medicaid Other | Source: Ambulatory Visit

## 2016-12-07 ENCOUNTER — Telehealth (HOSPITAL_COMMUNITY): Payer: Self-pay | Admitting: Radiology

## 2016-12-07 NOTE — Telephone Encounter (Signed)
Pt was scheduled for angioplasty with stenting for stenosis tomorrow (12/08/16). In review of her P2Y12 score of 237 on 12/06/16 Dr. Estanislado Pandy has decided to postpone her procedure. Patient was called and informed that her procedure has been cancelled and she needs to STOP her PLAVIX 75mg  1 daily as of today (12/07/16) and to start Brilinta 90mg  1 twice daily tomorrow (12/08/16). She was instructed to come to [facility] Department next Thursday (12/15/16) for a P2Y12 recheck and that her procedure will be rescheduled based on those results. The Brilinta was called in to [facility] for Brilinta 90mg  1 b.i.d. The pharmacist was also informed to DC the Plavix. The patient and pharmacist both stated agreement and understanding of this medication change. Patient agrees with this plan of care. JM

## 2016-12-08 ENCOUNTER — Inpatient Hospital Stay (HOSPITAL_COMMUNITY)
Admission: RE | Admit: 2016-12-08 | Payer: Medicaid Other | Source: Ambulatory Visit | Admitting: Interventional Radiology

## 2016-12-08 ENCOUNTER — Encounter (HOSPITAL_COMMUNITY): Admission: RE | Payer: Self-pay | Source: Ambulatory Visit

## 2016-12-08 ENCOUNTER — Encounter (HOSPITAL_COMMUNITY): Payer: Self-pay

## 2016-12-08 ENCOUNTER — Ambulatory Visit (HOSPITAL_COMMUNITY): Admission: RE | Admit: 2016-12-08 | Payer: Medicaid Other | Source: Ambulatory Visit

## 2016-12-08 SURGERY — IR WITH ANESTHESIA
Anesthesia: General

## 2016-12-15 ENCOUNTER — Other Ambulatory Visit: Payer: Self-pay | Admitting: Physician Assistant

## 2016-12-15 DIAGNOSIS — I669 Occlusion and stenosis of unspecified cerebral artery: Secondary | ICD-10-CM

## 2016-12-15 LAB — PLATELET INHIBITION P2Y12: Platelet Function  P2Y12: 44 [PRU] — ABNORMAL LOW (ref 194–418)

## 2016-12-22 ENCOUNTER — Telehealth (HOSPITAL_COMMUNITY): Payer: Self-pay | Admitting: Radiology

## 2016-12-22 NOTE — Telephone Encounter (Signed)
Pt called and stated that she had not gotten her results of her P2Y12 yet. I told her that I would contact the RN to have her call the patient. Pt agrees to this plan. The results were actually handed to RN Clarise Cruz) on 12/20/16 and 1020 am. JM

## 2016-12-23 ENCOUNTER — Telehealth (HOSPITAL_COMMUNITY): Payer: Self-pay | Admitting: *Deleted

## 2016-12-23 NOTE — Telephone Encounter (Signed)
Called patient back and spoke with there regarding her lab work.  Instructed her that we would call her back next week after Dr. Estanislado Pandy returns with his instructions.  Pt voiced understanding.

## 2016-12-23 NOTE — Telephone Encounter (Signed)
Attempted to call patient test results.  I was notified yesterday that patient had expected telephone call regarding results.  I had reviewed test results with Jannifer Franklin PA-C on 10/30 and since the patient had not been rescheduled no changes being made with medication until Dr. Estanislado Pandy returns and reviews lab results.

## 2017-01-16 ENCOUNTER — Encounter (HOSPITAL_COMMUNITY): Payer: Self-pay | Admitting: *Deleted

## 2017-01-16 ENCOUNTER — Telehealth (HOSPITAL_COMMUNITY): Payer: Self-pay | Admitting: *Deleted

## 2017-01-16 NOTE — Telephone Encounter (Signed)
Patient notified that we would no longer be writing prescriptions for antibiotics for the boils on her perineal area - I let her know that she needs to have her family doctor write this for her. She asked about Xanax and I told her that she would need to transfer that as well. She got a little concerned about this and asked that we send a notification to her family doctor. She said the name on her medicaid card said Jinny Blossom and the # was [phone].

## 2017-01-16 NOTE — Telephone Encounter (Signed)
Patient and family doctor (mailed/faxed) the notice that we will no longer be refilling Xanax. Fax # [phone] [facility] Bloss[facility] medical office)

## 2017-01-17 ENCOUNTER — Telehealth (HOSPITAL_COMMUNITY): Payer: Self-pay | Admitting: Radiology

## 2017-01-17 NOTE — Telephone Encounter (Signed)
Called pt to have her come get her P2Y12 checked and to see if we could reschedule her stenosis treatment. Pt will come get her blood test and we are rescheduling her treatment. JM

## 2017-01-18 ENCOUNTER — Other Ambulatory Visit (HOSPITAL_COMMUNITY): Payer: Self-pay | Admitting: Radiology

## 2017-01-18 DIAGNOSIS — I771 Stricture of artery: Secondary | ICD-10-CM

## 2017-01-18 DIAGNOSIS — I6502 Occlusion and stenosis of left vertebral artery: Secondary | ICD-10-CM | POA: Diagnosis not present

## 2017-01-18 DIAGNOSIS — I6613 Occlusion and stenosis of bilateral anterior cerebral arteries: Secondary | ICD-10-CM | POA: Diagnosis not present

## 2017-01-18 DIAGNOSIS — I6601 Occlusion and stenosis of right middle cerebral artery: Secondary | ICD-10-CM | POA: Diagnosis not present

## 2017-01-18 DIAGNOSIS — I6522 Occlusion and stenosis of left carotid artery: Secondary | ICD-10-CM | POA: Diagnosis present

## 2017-01-18 DIAGNOSIS — Z538 Procedure and treatment not carried out for other reasons: Secondary | ICD-10-CM | POA: Diagnosis not present

## 2017-01-18 LAB — PLATELET INHIBITION P2Y12: PLATELET FUNCTION P2Y12: 52 [PRU] — AB (ref 194–418)

## 2017-01-20 ENCOUNTER — Other Ambulatory Visit: Payer: Self-pay

## 2017-01-20 ENCOUNTER — Encounter (HOSPITAL_COMMUNITY): Payer: Self-pay | Admitting: *Deleted

## 2017-01-20 NOTE — Progress Notes (Signed)
Pt denies SOB, chest pain, and being under the care of a cardiologist. Pt denies having a cardiac cath and does not remember if she had a stress test. Pt stated that she cannot identify her medications because " they are in a pill pack. "  Pt stated that she does not have a ride to the pharmacy so, she will bring her medications in with her the morning of the procedure. Pt stated that her sister was given instructions by Anderson Malta, RN. Pt made aware to stop taking vitamins, fish oil and herbal medications; do not take any NSAIDs ie: Ibuprofen, Advil, Naproxen (Aleve), Motrin, BC and Goody Powder. Pt verbalized understanding of all pre-op instructions.

## 2017-01-23 ENCOUNTER — Encounter (HOSPITAL_COMMUNITY): Payer: Self-pay | Admitting: Certified Registered Nurse Anesthetist

## 2017-01-23 ENCOUNTER — Ambulatory Visit (HOSPITAL_COMMUNITY)
Admission: RE | Admit: 2017-01-23 | Discharge: 2017-01-23 | Disposition: A | Discharge status: Home / Self Care | Payer: Medicaid Other | Source: Ambulatory Visit | Attending: Interventional Radiology | Admitting: Interventional Radiology

## 2017-01-23 ENCOUNTER — Telehealth: Payer: Self-pay | Admitting: Neurology

## 2017-01-23 ENCOUNTER — Other Ambulatory Visit (HOSPITAL_COMMUNITY): Payer: Self-pay | Admitting: Interventional Radiology

## 2017-01-23 ENCOUNTER — Encounter (HOSPITAL_COMMUNITY)
Admission: RE | Disposition: A | Discharge status: Home / Self Care | Payer: Self-pay | Source: Ambulatory Visit | Attending: Interventional Radiology

## 2017-01-23 DIAGNOSIS — Z538 Procedure and treatment not carried out for other reasons: Secondary | ICD-10-CM | POA: Insufficient documentation

## 2017-01-23 DIAGNOSIS — I6522 Occlusion and stenosis of left carotid artery: Secondary | ICD-10-CM | POA: Insufficient documentation

## 2017-01-23 DIAGNOSIS — I6502 Occlusion and stenosis of left vertebral artery: Secondary | ICD-10-CM | POA: Insufficient documentation

## 2017-01-23 DIAGNOSIS — I6613 Occlusion and stenosis of bilateral anterior cerebral arteries: Secondary | ICD-10-CM | POA: Insufficient documentation

## 2017-01-23 DIAGNOSIS — I771 Stricture of artery: Secondary | ICD-10-CM

## 2017-01-23 DIAGNOSIS — I6601 Occlusion and stenosis of right middle cerebral artery: Secondary | ICD-10-CM | POA: Insufficient documentation

## 2017-01-23 HISTORY — DX: Other amnesia: R41.3

## 2017-01-23 HISTORY — PX: RADIOLOGY WITH ANESTHESIA: SHX6223

## 2017-01-23 LAB — BASIC METABOLIC PANEL
Anion gap: 9 (ref 5–15)
BUN: 15 mg/dL (ref 6–20)
CALCIUM: 9.7 mg/dL (ref 8.9–10.3)
CO2: 29 mmol/L (ref 22–32)
CREATININE: 0.83 mg/dL (ref 0.44–1.00)
Chloride: 106 mmol/L (ref 101–111)
GFR calc Af Amer: >60 mL/min (ref >60)
GLUCOSE: 116 mg/dL — AB (ref 65–99)
POTASSIUM: 3.7 mmol/L (ref 3.5–5.1)
Sodium: 144 mmol/L (ref 135–145)

## 2017-01-23 LAB — CBC
HCT: 38 % (ref 36.0–46.0)
Hemoglobin: 11.9 g/dL — ABNORMAL LOW (ref 12.0–15.0)
MCH: 29.8 pg (ref 26.0–34.0)
MCHC: 31.3 g/dL (ref 30.0–36.0)
MCV: 95 fL (ref 78.0–100.0)
PLATELETS: 265 10*3/uL (ref 150–400)
RBC: 4 MIL/uL (ref 3.87–5.11)
RDW: 15.8 % — AB (ref 11.5–15.5)
WBC: 9.2 10*3/uL (ref 4.0–10.5)

## 2017-01-23 LAB — GLUCOSE, CAPILLARY: Glucose-Capillary: 113 mg/dL — ABNORMAL HIGH (ref 65–99)

## 2017-01-23 LAB — PLATELET INHIBITION P2Y12: PLATELET FUNCTION P2Y12: 202 [PRU] (ref 194–418)

## 2017-01-23 SURGERY — IR WITH ANESTHESIA
Anesthesia: General

## 2017-01-23 MED ORDER — NIMODIPINE 30 MG PO CAPS
60.0000 mg | ORAL_CAPSULE | ORAL | Status: DC
  Filled 2017-01-23: qty 2

## 2017-01-23 MED ORDER — SODIUM CHLORIDE 0.9 % IV SOLN: INTRAVENOUS | Status: DC

## 2017-01-23 MED ORDER — VANCOMYCIN HCL 1000 MG IV SOLR: 1000.0000 mg | Freq: Once | INTRAVENOUS | Status: DC

## 2017-01-23 MED ORDER — VANCOMYCIN HCL IN DEXTROSE 1-5 GM/200ML-% IV SOLN
1000.0000 mg | Freq: Once | INTRAVENOUS | Status: DC
  Filled 2017-01-23: qty 200

## 2017-01-23 NOTE — Progress Notes (Signed)
0644- spoke with radiologist to see about orders, did p2y12 need drawn?  I was asked to call Dr. Estanislado Pandy or his PA in 30-45 min.

## 2017-01-23 NOTE — Telephone Encounter (Signed)
If patient calls back give them the following information;  Patient was seen by Dr. Estanislado Pandy the radiologist. See note from today. Pt and family have been spoken too about procedure and why it was not done.They need to call Dr. Dora Sims office about their concerns.See pre op from today.Thanks

## 2017-01-23 NOTE — Telephone Encounter (Signed)
Pt's sister Beatrice Lecher [phone] (not on DPR and she is aware of this) called said the pt was to have surgery today but "her blood was not right" and the surgeon was afraid to operate on her. She was advised to call the clinic. Pt's sister is confused and really doesn't know what to advise. She said if the pt is called and given a lot of information she won't remember.  Please look at hospital notes.

## 2017-01-23 NOTE — Progress Notes (Signed)
Patient ID: [name], female   DOB: 1961/02/17, 56 y.o.   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 Approximately 50% stenosis of the right vertebral artery at its origin. Approximately 65% stenosis of the left internal carotid artery supraclinoid segment. Approximately 70% stenosis of the right middle cerebral artery M1 segment proximally. Approximately 50-70% stenoses of the anterior cerebral arteries A1 segments bilaterally.  Was scheduled for L subclavia artery intervention today in IR with Dr Estanislado Pandy P2y12 :  202 this am  To high of result to safely proceed with this case per Dr Estanislado Pandy  Pt and family aware Will reschedule per Dr Estanislado Pandy scheduler will call pt.

## 2017-01-24 ENCOUNTER — Encounter (HOSPITAL_COMMUNITY): Payer: Self-pay | Admitting: Interventional Radiology

## 2017-01-24 LAB — HEMOGLOBIN A1C
HEMOGLOBIN A1C: 6.2 % — AB (ref 4.8–5.6)
Mean Plasma Glucose: 131 mg/dL

## 2017-01-26 ENCOUNTER — Encounter (HOSPITAL_COMMUNITY): Payer: Self-pay | Admitting: Emergency Medicine

## 2017-01-26 ENCOUNTER — Emergency Department (HOSPITAL_COMMUNITY)
Admission: EM | Admit: 2017-01-26 | Discharge: 2017-01-26 | Disposition: A | Discharge status: Home / Self Care | Payer: Medicaid Other | Attending: Emergency Medicine | Admitting: Emergency Medicine

## 2017-01-26 DIAGNOSIS — L299 Pruritus, unspecified: Secondary | ICD-10-CM | POA: Diagnosis present

## 2017-01-26 DIAGNOSIS — Z5321 Procedure and treatment not carried out due to patient leaving prior to being seen by health care provider: Secondary | ICD-10-CM | POA: Insufficient documentation

## 2017-01-26 NOTE — ED Notes (Signed)
Pt not in waiting area when called.

## 2017-01-26 NOTE — ED Triage Notes (Addendum)
Pt reports having itching in her head with sores.  Thinks she may have lice.  Dandruff noted but no nits

## 2017-01-27 ENCOUNTER — Encounter (HOSPITAL_COMMUNITY): Payer: Medicaid Other | Attending: Adult Health | Admitting: Adult Health
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
## 2017-03-01 ENCOUNTER — Ambulatory Visit: Payer: Medicaid Other | Admitting: Nurse Practitioner

## 2017-03-20 ENCOUNTER — Encounter: Payer: Self-pay | Admitting: Neurology

## 2017-03-20 ENCOUNTER — Ambulatory Visit: Payer: Medicaid Other | Admitting: Neurology

## 2017-03-20 VITALS — BP 130/76 | HR 71 | Wt 189.2 lb

## 2017-03-20 DIAGNOSIS — G40209 Localization-related (focal) (partial) symptomatic epilepsy and epileptic syndromes with complex partial seizures, not intractable, without status epilepticus: Secondary | ICD-10-CM

## 2017-03-20 DIAGNOSIS — G43011 Migraine without aura, intractable, with status migrainosus: Secondary | ICD-10-CM

## 2017-03-20 MED ORDER — TOPIRAMATE 25 MG PO TABS: 25.0000 mg | ORAL_TABLET | Freq: Two times a day (BID) | ORAL | 3 refills | Status: DC

## 2017-03-20 NOTE — Patient Instructions (Addendum)
- Continue aspirin and Brilinta daily for stroke prevention. Maintain strict control of hypertension with blood pressure goal below 130/90 and lipids with LDL cholesterol below 70 mg percent.  Dr. Estanislado Pandy office will contact you in regards to stent placement -will check EEG for possible seizure activity -will prescribe topamax 25mg  twice a day for headaches/migraines  -increase water intake -eat healthy and increase physical activity -perform neck stretching exercises to help with migraine -follow up in office in 1 month    Neck Exercises Neck exercises can be important for many reasons:  They can help you to improve and maintain flexibility in your neck. This can be especially important as you age.  They can help to make your neck stronger. This can make movement easier.  They can reduce or prevent neck pain.  They may help your upper back.  Ask your health care provider which neck exercises would be best for you. Exercises Neck Press Repeat this exercise 10 times. Do it first thing in the morning and right before bed or as told by your health care provider. 1. Lie on your back on a firm bed or on the floor with a pillow under your head. 2. Use your neck muscles to push your head down on the pillow and straighten your spine. 3. Hold the position as well as you can. Keep your head facing up and your chin tucked. 4. Slowly count to 5 while holding this position. 5. Relax for a few seconds. Then repeat.  Isometric Strengthening Do a full set of these exercises 2 times a day or as told by your health care provider. 1. Sit in a supportive chair and place your hand on your forehead. 2. Push forward with your head and neck while pushing back with your hand. Hold for 10 seconds. 3. Relax. Then repeat the exercise 3 times. 4. Next, do thesequence again, this time putting your hand against the back of your head. Use your head and neck to push backward against the hand  pressure. 5. Finally, do the same exercise on either side of your head, pushing sideways against the pressure of your hand.  Prone Head Lifts Repeat this exercise 5 times. Do this 2 times a day or as told by your health care provider. 1. Lie face-down, resting on your elbows so that your chest and upper back are raised. 2. Start with your head facing downward, near your chest. Position your chin either on or near your chest. 3. Slowly lift your head upward. Lift until you are looking straight ahead. Then continue lifting your head as far back as you can stretch. 4. Hold your head up for 5 seconds. Then slowly lower it to your starting position.  Supine Head Lifts Repeat this exercise 8-10 times. Do this 2 times a day or as told by your health care provider. 1. Lie on your back, bending your knees to point to the ceiling and keeping your feet flat on the floor. 2. Lift your head slowly off the floor, raising your chin toward your chest. 3. Hold for 5 seconds. 4. Relax and repeat.  Scapular Retraction Repeat this exercise 5 times. Do this 2 times a day or as told by your health care provider. 1. Stand with your arms at your sides. Look straight ahead. 2. Slowly pull both shoulders backward and downward until you feel a stretch between your shoulder blades in your upper back. 3. Hold for 10-30 seconds. 4. Relax and repeat.  Contact a health  care provider if:  Your neck pain or discomfort gets much worse when you do an exercise.  Your neck pain or discomfort does not improve within 2 hours after you exercise. If you have any of these problems, stop exercising right away. Do not do the exercises again unless your health care provider says that you can. Get help right away if:  You develop sudden, severe neck pain. If this happens, stop exercising right away. Do not do the exercises again unless your health care provider says that you can. Exercises Neck Stretch  Repeat this exercise  3-5 times. 1. Do this exercise while standing or while sitting in a chair. 2. Place your feet flat on the floor, shoulder-width apart. 3. Slowly turn your head to the right. Turn it all the way to the right so you can look over your right shoulder. Do not tilt or tip your head. 4. Hold this position for 10-30 seconds. 5. Slowly turn your head to the left, to look over your left shoulder. 6. Hold this position for 10-30 seconds.  Neck Retraction Repeat this exercise 8-10 times. Do this 3-4 times a day or as told by your health care provider. 1. Do this exercise while standing or while sitting in a sturdy chair. 2. Look straight ahead. Do not bend your neck. 3. Use your fingers to push your chin backward. Do not bend your neck for this movement. Continue to face straight ahead. If you are doing the exercise properly, you will feel a slight sensation in your throat and a stretch at the back of your neck. 4. Hold the stretch for 1-2 seconds. Relax and repeat.  This information is not intended to replace advice given to you by your health care provider. Make sure you discuss any questions you have with your health care provider. Document Released: 01/19/2015 Document Revised: 07/16/2015 Document Reviewed: 08/18/2014 Elsevier Interactive Patient Education  [employer].
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
## 2017-04-03 ENCOUNTER — Other Ambulatory Visit: Payer: Medicaid Other

## 2017-04-04 ENCOUNTER — Encounter: Payer: Self-pay | Admitting: Neurology

## 2017-04-10 ENCOUNTER — Telehealth: Payer: Self-pay | Admitting: Neurology

## 2017-04-10 ENCOUNTER — Other Ambulatory Visit: Payer: Medicaid Other

## 2017-04-10 NOTE — Telephone Encounter (Signed)
PT is to be contacted by Anderson Malta with Dr. Kathi Ludwig office. Message sent to [facility].

## 2017-04-10 NOTE — Telephone Encounter (Signed)
Renee Pain  You 22 minutes ago (12:24 PM)    PT said she was supposed to be contacted about getting a stint & has yet to hear anything (Routing comment)

## 2017-04-10 NOTE — Telephone Encounter (Signed)
Order has been been to [facility] with Dr. Estanislado Pandy telephone [phone] - fax [phone].

## 2017-05-30 ENCOUNTER — Ambulatory Visit: Payer: Medicaid Other | Admitting: Neurology

## 2017-05-31 ENCOUNTER — Encounter: Payer: Self-pay | Admitting: Neurology

## 2017-05-31 ENCOUNTER — Ambulatory Visit: Payer: Medicaid Other | Admitting: Neurology

## 2017-05-31 VITALS — BP 95/54 | HR 63 | Ht 66.0 in | Wt 179.0 lb

## 2017-05-31 DIAGNOSIS — G458 Other transient cerebral ischemic attacks and related syndromes: Secondary | ICD-10-CM
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
## 2017-05-31 NOTE — Patient Instructions (Addendum)
I had a long d/w patient about her remotet stroke,suclavian steal. risk for recurrent stroke/TIAs, personally independently reviewed imaging studies and stroke evaluation results and answered questions.Continue aspirin 325 mg  And Brillinta 90 mg twice daily  for secondary stroke prevention and maintain strict control of hypertension with blood pressure goal below 130/90, diabetes with hemoglobin A1c goal below 6.5% and lipids with LDL cholesterol goal below 70 mg/dL. I also advised the patient to eat a healthy diet with plenty of whole grains, cereals, fruits and vegetables, exercise regularly and maintain ideal body weight  n. Follow-up carotid ultrasound and transcranial Doppler studies. Follow-up with Dr. Estanislado Pandy for elective subclavian stenting if the vessel is still open.Followup in the future with my nurse practitioner in 6 months or call earlier if necessary

## 2017-06-08 ENCOUNTER — Ambulatory Visit (HOSPITAL_COMMUNITY): Admission: RE | Admit: 2017-06-08 | Payer: Medicaid Other | Source: Ambulatory Visit

## 2017-06-14 ENCOUNTER — Telehealth: Payer: Self-pay | Admitting: Neurology

## 2017-06-14 ENCOUNTER — Other Ambulatory Visit: Payer: Self-pay | Admitting: Neurology

## 2017-06-14 DIAGNOSIS — I6529 Occlusion and stenosis of unspecified carotid artery: Secondary | ICD-10-CM

## 2017-06-14 NOTE — Telephone Encounter (Signed)
Patient did not answer the telephone her voice mail picked up left her a detailed message Dr. Leonie Man was ordering a CT angio . That CT has to go through  insurance process as well . I relayed on voice mail patient could call me back if she needed to. Thanks Hinton Dyer.

## 2017-06-14 NOTE — Telephone Encounter (Signed)
Noted, I will send the order to [facility] they will obtain the auth and will reach out to the pt to schedule.

## 2017-06-14 NOTE — Telephone Encounter (Signed)
Patient called me back and I relayed message about CT Angio Message below.  If CT is approved Patient needs address where she needs to go to give her transportation . Patient has trouble reading and writing and understating direction.

## 2017-06-14 NOTE — Telephone Encounter (Signed)
I cannot understand why they denied these inexpensive tests. I will try to get CT angiogram of the brain and neck instead which is clearly more expensive

## 2017-06-14 NOTE — Telephone Encounter (Signed)
Medicaid order sent to [facility]. They will obtain the auth and will reach out to the pt to schedule.

## 2017-06-14 NOTE — Telephone Encounter (Signed)
Noted I will call patient and make her aware .

## 2017-06-14 NOTE — Telephone Encounter (Signed)
Medicaid Has denied TCD and Carotid . Dr. Leonie Man your office notes were sent in and Medicaid still came back with a denial. I called and told patient that her insurance denied. Patient understood and wanted to know what next step would be . Thanks Hinton Dyer

## 2017-06-15 ENCOUNTER — Ambulatory Visit (HOSPITAL_COMMUNITY): Payer: Medicaid Other

## 2017-06-23 ENCOUNTER — Other Ambulatory Visit: Payer: Medicaid Other

## 2017-07-03 ENCOUNTER — Ambulatory Visit
Admission: RE | Admit: 2017-07-03 | Discharge: 2017-07-03 | Disposition: A | Discharge status: Home / Self Care | Payer: Medicaid Other | Source: Ambulatory Visit | Attending: Neurology | Admitting: Neurology

## 2017-07-03 DIAGNOSIS — I6529 Occlusion and stenosis of unspecified carotid artery: Secondary | ICD-10-CM

## 2017-07-03 MED ORDER — IOPAMIDOL (ISOVUE-370) INJECTION 76%
75.0000 mL | Freq: Once | INTRAVENOUS | Status: AC | PRN
  Administered 2017-07-03: 75 mL via INTRAVENOUS

## 2017-07-05 ENCOUNTER — Telehealth: Payer: Self-pay

## 2017-07-05 ENCOUNTER — Other Ambulatory Visit: Payer: Self-pay | Admitting: Neurology

## 2017-07-05 DIAGNOSIS — G458 Other transient cerebral ischemic attacks and related syndromes: Secondary | ICD-10-CM

## 2017-07-05 NOTE — Telephone Encounter (Signed)
-----   Message from Garvin Fila, MD sent at 07/03/2017  3:48 PM EDT ----- Mitchell Heir inform the patient that CT angiogram of the neck shows persistent subclavian steal and advise her to see Dr. Estanislado Pandy whom  I have referred her to to consider possible angioplasty and stenting

## 2017-07-05 NOTE — Telephone Encounter (Signed)
Rn call patient about her CT angiogram neck results. Rn stated it shows persistent subclavian steal and Dr.SEthi advise her to see Dr.Deveshwar. The order is put in for Dr .Estanislado Pandy for possible angioplasty and stenting.RN stated Dr. Estanislado Pandy office will be giving her a call for an appt. Pt verbalized understanding. ------

## 2017-08-31 NOTE — Telephone Encounter (Signed)
Attempted phone call

## 2017-09-13 ENCOUNTER — Other Ambulatory Visit: Payer: Self-pay | Admitting: Specialist

## 2017-09-13 DIAGNOSIS — N644 Mastodynia: Secondary | ICD-10-CM

## 2017-09-20 ENCOUNTER — Other Ambulatory Visit: Payer: Medicaid Other

## 2017-09-25 ENCOUNTER — Other Ambulatory Visit: Payer: Medicaid Other

## 2017-10-04 ENCOUNTER — Ambulatory Visit
Admission: RE | Admit: 2017-10-04 | Discharge: 2017-10-04 | Disposition: A | Discharge status: Home / Self Care | Payer: Medicaid Other | Source: Ambulatory Visit | Attending: Specialist | Admitting: Specialist

## 2017-10-04 ENCOUNTER — Other Ambulatory Visit: Payer: Self-pay | Admitting: Specialist

## 2017-10-04 DIAGNOSIS — N644 Mastodynia: Secondary | ICD-10-CM

## 2017-10-25 ENCOUNTER — Inpatient Hospital Stay (HOSPITAL_COMMUNITY)
Admission: EM | Admit: 2017-10-25 | Discharge: 2017-10-27 | DRG: 871 | Disposition: A | Discharge status: Home / Self Care | Payer: Medicaid Other | Attending: Internal Medicine | Admitting: Internal Medicine

## 2017-10-25 ENCOUNTER — Emergency Department (HOSPITAL_COMMUNITY): Payer: Medicaid Other

## 2017-10-25 ENCOUNTER — Encounter (HOSPITAL_COMMUNITY): Payer: Self-pay

## 2017-10-25 ENCOUNTER — Emergency Department (HOSPITAL_COMMUNITY)
Admission: EM | Admit: 2017-10-25 | Discharge: 2017-10-25 | Discharge status: Against Medical Advice | Payer: Medicaid Other | Source: Home / Self Care | Attending: Emergency Medicine | Admitting: Emergency Medicine

## 2017-10-25 ENCOUNTER — Encounter (HOSPITAL_COMMUNITY): Payer: Self-pay | Admitting: Emergency Medicine

## 2017-10-25 ENCOUNTER — Other Ambulatory Visit: Payer: Self-pay

## 2017-10-25 DIAGNOSIS — R599 Enlarged lymph nodes, unspecified: Secondary | ICD-10-CM | POA: Diagnosis not present

## 2017-10-25 DIAGNOSIS — Z853 Personal history of malignant neoplasm of breast: Secondary | ICD-10-CM

## 2017-10-25 DIAGNOSIS — E876 Hypokalemia: Secondary | ICD-10-CM

## 2017-10-25 DIAGNOSIS — R0902 Hypoxemia: Secondary | ICD-10-CM | POA: Diagnosis present

## 2017-10-25 DIAGNOSIS — I10 Essential (primary) hypertension: Secondary | ICD-10-CM | POA: Diagnosis present

## 2017-10-25 DIAGNOSIS — I708 Atherosclerosis of other arteries: Secondary | ICD-10-CM | POA: Diagnosis present

## 2017-10-25 DIAGNOSIS — Z885 Allergy status to narcotic agent status: Secondary | ICD-10-CM

## 2017-10-25 DIAGNOSIS — Z6825 Body mass index (BMI) 25.0-25.9, adult: Secondary | ICD-10-CM | POA: Diagnosis not present

## 2017-10-25 DIAGNOSIS — J449 Chronic obstructive pulmonary disease, unspecified: Secondary | ICD-10-CM | POA: Diagnosis present

## 2017-10-25 DIAGNOSIS — N12 Tubulo-interstitial nephritis, not specified as acute or chronic: Secondary | ICD-10-CM | POA: Diagnosis present

## 2017-10-25 DIAGNOSIS — N289 Disorder of kidney and ureter, unspecified: Secondary | ICD-10-CM

## 2017-10-25 DIAGNOSIS — E669 Obesity, unspecified: Secondary | ICD-10-CM

## 2017-10-25 DIAGNOSIS — E860 Dehydration: Secondary | ICD-10-CM | POA: Diagnosis present

## 2017-10-25 DIAGNOSIS — Z9013 Acquired absence of bilateral breasts and nipples: Secondary | ICD-10-CM

## 2017-10-25 DIAGNOSIS — E119 Type 2 diabetes mellitus without complications: Secondary | ICD-10-CM | POA: Diagnosis present

## 2017-10-25 DIAGNOSIS — E785 Hyperlipidemia, unspecified: Secondary | ICD-10-CM | POA: Diagnosis present

## 2017-10-25 DIAGNOSIS — G894 Chronic pain syndrome: Secondary | ICD-10-CM | POA: Diagnosis present

## 2017-10-25 DIAGNOSIS — Z79891 Long term (current) use of opiate analgesic: Secondary | ICD-10-CM

## 2017-10-25 DIAGNOSIS — Z9221 Personal history of antineoplastic chemotherapy: Secondary | ICD-10-CM | POA: Diagnosis not present

## 2017-10-25 DIAGNOSIS — C50412 Malignant neoplasm of upper-outer quadrant of left female breast: Secondary | ICD-10-CM

## 2017-10-25 DIAGNOSIS — N151 Renal and perinephric abscess: Secondary | ICD-10-CM | POA: Diagnosis present

## 2017-10-25 DIAGNOSIS — Z823 Family history of stroke: Secondary | ICD-10-CM

## 2017-10-25 DIAGNOSIS — F411 Generalized anxiety disorder: Secondary | ICD-10-CM

## 2017-10-25 DIAGNOSIS — I69354 Hemiplegia and hemiparesis following cerebral infarction affecting left non-dominant side: Secondary | ICD-10-CM

## 2017-10-25 DIAGNOSIS — Z7982 Long term (current) use of aspirin: Secondary | ICD-10-CM

## 2017-10-25 DIAGNOSIS — F17211 Nicotine dependence, cigarettes, in remission: Secondary | ICD-10-CM | POA: Diagnosis present

## 2017-10-25 DIAGNOSIS — Z791 Long term (current) use of non-steroidal anti-inflammatories (NSAID): Secondary | ICD-10-CM

## 2017-10-25 DIAGNOSIS — G458 Other transient cerebral ischemic attacks and related syndromes: Secondary | ICD-10-CM | POA: Diagnosis present

## 2017-10-25 DIAGNOSIS — D649 Anemia, unspecified: Secondary | ICD-10-CM | POA: Diagnosis present

## 2017-10-25 DIAGNOSIS — Z923 Personal history of irradiation: Secondary | ICD-10-CM

## 2017-10-25 DIAGNOSIS — N179 Acute kidney failure, unspecified: Secondary | ICD-10-CM | POA: Diagnosis present

## 2017-10-25 DIAGNOSIS — Z23 Encounter for immunization: Secondary | ICD-10-CM

## 2017-10-25 DIAGNOSIS — Z7984 Long term (current) use of oral hypoglycemic drugs: Secondary | ICD-10-CM

## 2017-10-25 DIAGNOSIS — Z87891 Personal history of nicotine dependence: Secondary | ICD-10-CM | POA: Diagnosis not present

## 2017-10-25 DIAGNOSIS — R918 Other nonspecific abnormal finding of lung field: Secondary | ICD-10-CM

## 2017-10-25 DIAGNOSIS — Z90710 Acquired absence of both cervix and uterus: Secondary | ICD-10-CM

## 2017-10-25 DIAGNOSIS — L409 Psoriasis, unspecified: Secondary | ICD-10-CM | POA: Diagnosis present

## 2017-10-25 DIAGNOSIS — Z880 Allergy status to penicillin: Secondary | ICD-10-CM

## 2017-10-25 DIAGNOSIS — A419 Sepsis, unspecified organism: Secondary | ICD-10-CM | POA: Diagnosis not present

## 2017-10-25 DIAGNOSIS — Z8673 Personal history of transient ischemic attack (TIA), and cerebral infarction without residual deficits: Secondary | ICD-10-CM | POA: Diagnosis not present

## 2017-10-25 DIAGNOSIS — E1169 Type 2 diabetes mellitus with other specified complication: Secondary | ICD-10-CM

## 2017-10-25 DIAGNOSIS — C50919 Malignant neoplasm of unspecified site of unspecified female breast: Secondary | ICD-10-CM | POA: Diagnosis present

## 2017-10-25 DIAGNOSIS — M79622 Pain in left upper arm: Secondary | ICD-10-CM | POA: Diagnosis not present

## 2017-10-25 DIAGNOSIS — R509 Fever, unspecified: Secondary | ICD-10-CM | POA: Diagnosis present

## 2017-10-25 DIAGNOSIS — Z79899 Other long term (current) drug therapy: Secondary | ICD-10-CM

## 2017-10-25 DIAGNOSIS — Z8614 Personal history of Methicillin resistant Staphylococcus aureus infection: Secondary | ICD-10-CM

## 2017-10-25 DIAGNOSIS — Z7902 Long term (current) use of antithrombotics/antiplatelets: Secondary | ICD-10-CM

## 2017-10-25 DIAGNOSIS — J45909 Unspecified asthma, uncomplicated: Secondary | ICD-10-CM | POA: Diagnosis not present

## 2017-10-25 HISTORY — DX: Other nonspecific abnormal finding of lung field: R91.8

## 2017-10-25 LAB — BASIC METABOLIC PANEL
Anion gap: 9 (ref 5–15)
Anion gap: 10 (ref 5–15)
BUN: 14 mg/dL (ref 6–20)
BUN: 16 mg/dL (ref 6–20)
CALCIUM: 9.1 mg/dL (ref 8.9–10.3)
CHLORIDE: 94 mmol/L — AB (ref 98–111)
CO2: 33 mmol/L — ABNORMAL HIGH (ref 22–32)
CO2: 30 mmol/L (ref 22–32)
CREATININE: 1.43 mg/dL — AB (ref 0.44–1.00)
Calcium: 9.2 mg/dL (ref 8.9–10.3)
Chloride: 95 mmol/L — ABNORMAL LOW (ref 98–111)
Creatinine, Ser: 1.63 mg/dL — ABNORMAL HIGH (ref 0.44–1.00)
GFR calc Af Amer: 46 mL/min — ABNORMAL LOW (ref >60)
GFR calc non Af Amer: 40 mL/min — ABNORMAL LOW (ref >60)
GFR, EST AFRICAN AMERICAN: 40 mL/min — AB (ref >60)
GFR, EST NON AFRICAN AMERICAN: 34 mL/min — AB (ref >60)
GLUCOSE: 111 mg/dL — AB (ref 70–99)
Glucose, Bld: 115 mg/dL — ABNORMAL HIGH (ref 70–99)
Potassium: 2.9 mmol/L — ABNORMAL LOW (ref 3.5–5.1)
Potassium: 3.4 mmol/L — ABNORMAL LOW (ref 3.5–5.1)
SODIUM: 136 mmol/L (ref 135–145)
SODIUM: 135 mmol/L (ref 135–145)

## 2017-10-25 LAB — URINALYSIS, ROUTINE W REFLEX MICROSCOPIC
BILIRUBIN URINE: NEGATIVE
GLUCOSE, UA: NEGATIVE mg/dL
Ketones, ur: NEGATIVE mg/dL
Leukocytes, UA: NEGATIVE
NITRITE: NEGATIVE
PH: 5 (ref 5.0–8.0)
Protein, ur: NEGATIVE mg/dL
SPECIFIC GRAVITY, URINE: 1.014 (ref 1.005–1.030)

## 2017-10-25 LAB — CBC WITH DIFFERENTIAL/PLATELET
BASOS PCT: 0 %
Basophils Absolute: 0 10*3/uL (ref 0.0–0.1)
EOS PCT: 2 %
Eosinophils Absolute: 0.2 10*3/uL (ref 0.0–0.7)
HEMATOCRIT: 29 % — AB (ref 36.0–46.0)
Hemoglobin: 8.6 g/dL — ABNORMAL LOW (ref 12.0–15.0)
Lymphocytes Relative: 12 %
Lymphs Abs: 1.2 10*3/uL (ref 0.7–4.0)
MCH: 23.6 pg — ABNORMAL LOW (ref 26.0–34.0)
MCHC: 29.7 g/dL — AB (ref 30.0–36.0)
MCV: 79.7 fL (ref 78.0–100.0)
MONO ABS: 0.7 10*3/uL (ref 0.1–1.0)
Monocytes Relative: 7 %
Neutro Abs: 8 10*3/uL — ABNORMAL HIGH (ref 1.7–7.7)
Neutrophils Relative %: 79 %
Platelets: 540 10*3/uL — ABNORMAL HIGH (ref 150–400)
RBC: 3.64 MIL/uL — ABNORMAL LOW (ref 3.87–5.11)
RDW: 20.2 % — AB (ref 11.5–15.5)
WBC: 10.1 10*3/uL (ref 4.0–10.5)

## 2017-10-25 LAB — POC OCCULT BLOOD, ED: FECAL OCCULT BLD: NEGATIVE

## 2017-10-25 LAB — CBC
HCT: 29.2 % — ABNORMAL LOW (ref 36.0–46.0)
Hemoglobin: 8.9 g/dL — ABNORMAL LOW (ref 12.0–15.0)
MCH: 24.1 pg — ABNORMAL LOW (ref 26.0–34.0)
MCHC: 30.5 g/dL (ref 30.0–36.0)
MCV: 79.1 fL (ref 78.0–100.0)
Platelets: 596 10*3/uL — ABNORMAL HIGH (ref 150–400)
RBC: 3.69 MIL/uL — AB (ref 3.87–5.11)
RDW: 20.3 % — AB (ref 11.5–15.5)
WBC: 10.6 10*3/uL — AB (ref 4.0–10.5)

## 2017-10-25 LAB — SODIUM, URINE, RANDOM: Sodium, Ur: 30 mmol/L

## 2017-10-25 LAB — CREATININE, URINE, RANDOM: CREATININE, URINE: 46.5 mg/dL

## 2017-10-25 MED ORDER — POTASSIUM CHLORIDE CRYS ER 20 MEQ PO TBCR: 40.0000 meq | EXTENDED_RELEASE_TABLET | Freq: Once | ORAL | Status: DC

## 2017-10-25 MED ORDER — INSULIN ASPART 100 UNIT/ML ~~LOC~~ SOLN
0.0000 [IU] | Freq: Three times a day (TID) | SUBCUTANEOUS | Status: DC
  Administered 2017-10-26 (×2): 1 [IU] via SUBCUTANEOUS
  Administered 2017-10-27: 2 [IU] via SUBCUTANEOUS

## 2017-10-25 MED ORDER — TICAGRELOR 90 MG PO TABS
90.0000 mg | ORAL_TABLET | Freq: Two times a day (BID) | ORAL | Status: DC
  Administered 2017-10-26 – 2017-10-27 (×3): 90 mg via ORAL
  Filled 2017-10-25 (×3): qty 1

## 2017-10-25 MED ORDER — LEVOFLOXACIN IN D5W 750 MG/150ML IV SOLN: 750.0000 mg | Freq: Once | INTRAVENOUS | Status: DC

## 2017-10-25 MED ORDER — OXYCODONE HCL 5 MG PO TABS
15.0000 mg | ORAL_TABLET | ORAL | Status: DC | PRN
  Administered 2017-10-25 – 2017-10-27 (×6): 15 mg via ORAL
  Filled 2017-10-25 (×6): qty 3

## 2017-10-25 MED ORDER — ALPRAZOLAM 1 MG PO TABS
1.0000 mg | ORAL_TABLET | Freq: Two times a day (BID) | ORAL | Status: DC | PRN
  Administered 2017-10-25 – 2017-10-27 (×4): 1 mg via ORAL
  Filled 2017-10-25 (×4): qty 1

## 2017-10-25 MED ORDER — OLOPATADINE HCL 0.1 % OP SOLN
1.0000 [drp] | Freq: Two times a day (BID) | OPHTHALMIC | Status: DC
  Administered 2017-10-26 – 2017-10-27 (×3): 1 [drp] via OPHTHALMIC
  Filled 2017-10-25: qty 5

## 2017-10-25 MED ORDER — LEVOFLOXACIN IN D5W 750 MG/150ML IV SOLN
750.0000 mg | Freq: Once | INTRAVENOUS | Status: DC
  Administered 2017-10-25: 750 mg via INTRAVENOUS
  Filled 2017-10-25: qty 150

## 2017-10-25 MED ORDER — SODIUM CHLORIDE 0.9 % IV SOLN
INTRAVENOUS | Status: DC
  Administered 2017-10-25 – 2017-10-26 (×2): via INTRAVENOUS

## 2017-10-25 MED ORDER — POTASSIUM CHLORIDE CRYS ER 20 MEQ PO TBCR
40.0000 meq | EXTENDED_RELEASE_TABLET | Freq: Once | ORAL | Status: AC
  Administered 2017-10-25: 40 meq via ORAL
  Filled 2017-10-25: qty 2

## 2017-10-25 MED ORDER — ACETAMINOPHEN 325 MG PO TABS: 650.0000 mg | ORAL_TABLET | Freq: Four times a day (QID) | ORAL | Status: DC | PRN

## 2017-10-25 MED ORDER — INFLUENZA VAC SPLIT QUAD 0.5 ML IM SUSY
0.5000 mL | PREFILLED_SYRINGE | INTRAMUSCULAR | Status: AC
  Administered 2017-10-26: 0.5 mL via INTRAMUSCULAR
  Filled 2017-10-25: qty 0.5

## 2017-10-25 MED ORDER — ONDANSETRON HCL 4 MG PO TABS: 4.0000 mg | ORAL_TABLET | Freq: Four times a day (QID) | ORAL | Status: DC | PRN

## 2017-10-25 MED ORDER — SODIUM CHLORIDE 0.9 % IV SOLN
1.0000 g | Freq: Two times a day (BID) | INTRAVENOUS | Status: DC
  Administered 2017-10-25 – 2017-10-26 (×3): 1 g via INTRAVENOUS
  Filled 2017-10-25 (×5): qty 1

## 2017-10-25 MED ORDER — FAMOTIDINE 20 MG PO TABS
20.0000 mg | ORAL_TABLET | Freq: Two times a day (BID) | ORAL | Status: DC
  Administered 2017-10-26 – 2017-10-27 (×2): 20 mg via ORAL
  Filled 2017-10-25 (×4): qty 1

## 2017-10-25 MED ORDER — SODIUM CHLORIDE 0.9 % IV BOLUS (SEPSIS)
1000.0000 mL | Freq: Once | INTRAVENOUS | Status: AC
  Administered 2017-10-25: 1000 mL via INTRAVENOUS

## 2017-10-25 MED ORDER — ASPIRIN 325 MG PO TABS
325.0000 mg | ORAL_TABLET | Freq: Every day | ORAL | Status: DC
  Filled 2017-10-25: qty 1

## 2017-10-25 MED ORDER — ACETAMINOPHEN 500 MG PO TABS
1000.0000 mg | ORAL_TABLET | Freq: Once | ORAL | Status: AC
  Administered 2017-10-25: 1000 mg via ORAL
  Filled 2017-10-25: qty 2

## 2017-10-25 MED ORDER — IOHEXOL 300 MG/ML  SOLN
75.0000 mL | Freq: Once | INTRAMUSCULAR | Status: AC | PRN
  Administered 2017-10-25: 75 mL via INTRAVENOUS

## 2017-10-25 MED ORDER — GABAPENTIN 400 MG PO CAPS
800.0000 mg | ORAL_CAPSULE | Freq: Three times a day (TID) | ORAL | Status: DC
  Administered 2017-10-25 – 2017-10-27 (×5): 800 mg via ORAL
  Filled 2017-10-25 (×5): qty 2

## 2017-10-25 MED ORDER — DICLOFENAC SODIUM 1 % TD GEL
2.0000 g | Freq: Every day | TRANSDERMAL | Status: DC | PRN
  Filled 2017-10-25 (×2): qty 100

## 2017-10-25 MED ORDER — SODIUM CHLORIDE 0.9 % IV BOLUS (SEPSIS): 500.0000 mL | Freq: Once | INTRAVENOUS | Status: DC

## 2017-10-25 MED ORDER — SENNOSIDES-DOCUSATE SODIUM 8.6-50 MG PO TABS: 1.0000 | ORAL_TABLET | Freq: Every evening | ORAL | Status: DC | PRN

## 2017-10-25 MED ORDER — SODIUM CHLORIDE 0.9 % IV BOLUS
500.0000 mL | Freq: Once | INTRAVENOUS | Status: AC
  Administered 2017-10-25: 500 mL via INTRAVENOUS

## 2017-10-25 MED ORDER — ONDANSETRON HCL 4 MG/2ML IJ SOLN: 4.0000 mg | Freq: Four times a day (QID) | INTRAMUSCULAR | Status: DC | PRN

## 2017-10-25 MED ORDER — ATORVASTATIN CALCIUM 10 MG PO TABS
10.0000 mg | ORAL_TABLET | Freq: Every day | ORAL | Status: DC
  Administered 2017-10-25 – 2017-10-26 (×2): 10 mg via ORAL
  Filled 2017-10-25 (×2): qty 1

## 2017-10-25 MED ORDER — ACETAMINOPHEN 650 MG RE SUPP: 650.0000 mg | Freq: Four times a day (QID) | RECTAL | Status: DC | PRN

## 2017-10-25 MED ORDER — SODIUM CHLORIDE 0.9 % IV BOLUS
1000.0000 mL | Freq: Once | INTRAVENOUS | Status: AC
  Administered 2017-10-25: 1000 mL via INTRAVENOUS

## 2017-10-25 MED ORDER — POTASSIUM CHLORIDE 10 MEQ/100ML IV SOLN
10.0000 meq | Freq: Once | INTRAVENOUS | Status: AC
  Administered 2017-10-25: 10 meq via INTRAVENOUS
  Filled 2017-10-25: qty 100

## 2017-10-25 MED ORDER — MONTELUKAST SODIUM 10 MG PO TABS
10.0000 mg | ORAL_TABLET | Freq: Every day | ORAL | Status: DC
Start: 2017-10-25 — End: 2017-10-27
  Administered 2017-10-25 – 2017-10-26 (×2): 10 mg via ORAL
  Filled 2017-10-25 (×2): qty 1

## 2017-10-25 MED ORDER — INSULIN ASPART 100 UNIT/ML ~~LOC~~ SOLN: 0.0000 [IU] | Freq: Every day | SUBCUTANEOUS | Status: DC

## 2017-10-25 NOTE — ED Notes (Signed)
Pt requesting to leave after asked about going to smoke and asking for food. Pt informed can not have any food until results are back and that this is a non smoking facility. Pt agitated and requesting to leave. Informed PA and in to talk with pt. Pt now states "ill stay for a few more minutes." pt tearful but cooperative at this time.

## 2017-10-25 NOTE — ED Triage Notes (Signed)
Pt reports pain in left arm for past few days.  Also reports cough x 1 week that is productive with black sputum.

## 2017-10-25 NOTE — ED Provider Notes (Signed)
[facility] EMERGENCY DEPARTMENT Provider Note   CSN: [id number] Arrival date & time: 10/25/17  2778     History   Chief Complaint Chief Complaint  Patient presents with  . Arm Pain  . Cough    HPI [name] is a 57 y.o. female.  Patient is a 57 year old female who presents to the emergency department with complaint of cough and left arm pain.  The patient states that the cough has been going on for 5 to 6 days.  The patient states that she is a smoker.  She has been having a productive cough which she which she says the phlegm is dark to black.  She has not seen any blood in the phlegm.  She has not had any injury to her chest or lungs that she is aware of.  She says that she has been sweaty a few times, but she has not had any actual fever at least none that she has measured.  No loss of consciousness reported.  She has not been out of the country recently.  She has not been around any noxious fumes that she is aware of.  The patient also complains of left arm pain.  She says she has had this problem off and on since last August when she had a stroke in that area.  In the last for 5 days it seems as though the pain is been getting worse.  The pain is not responding to her current medications.  She has not had any recent fall or injury.  She also requests assistance with her discomfort.  The history is provided by the patient.  Arm Pain  Pertinent negatives include no chest pain, no abdominal pain and no shortness of breath.  Cough  Pertinent negatives include no chest pain, no shortness of breath and no wheezing.    Past Medical History:  Diagnosis Date  . Asthma   . Back muscle spasm 12/13/2010  . Cancer [facility])    breast cancer, remission 2010  . Chronic back pain   . Chronic foot pain   . COPD (chronic obstructive pulmonary disease) ([place]) 09/14/2010  . Diabetes mellitus without complication ([place])   . H/O MRSA infection 09/14/2010  . High blood cholesterol   .  Hyperlipidemia 12/07/2012  . Hypertension   . 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 Memory loss   . Psoriasis 12/13/2010  . Stroke ([place])   . Tobacco abuse 12/07/2012    Patient Active Problem List   Diagnosis Date Noted  . Acute bilat watershed infarction [facility]) 10/11/2016  . 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 Cerebral embolism with cerebral infarction 10/09/2016  . CVA (cerebral vascular accident) ([place]) 10/09/2016  . Chronic pain syndrome 10/09/2016  . Diabetes mellitus type 2 in obese ([place]) 10/09/2016  . AKI (acute kidney injury) ([place])   . Right-sided muscle weakness   . Eye drainage 02/28/2014  . Conjunctivitis 02/28/2014  . Back pain 02/28/2014  . Hyperlipidemia 12/07/2012  . Tobacco abuse 12/07/2012  . Psoriasis 12/13/2010  . COPD (chronic obstructive pulmonary disease) ([place]) 09/14/2010  . 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 H/O MRSA infection 09/14/2010  . Anxiety state 05/13/2009  . DEPRESSION 05/13/2009  . Essential hypertension 05/13/2009  . H/O Stage III Breast cancer, left 05/13/2009    Past Surgical History:  Procedure Laterality Date  . ABDOMINAL HYSTERECTOMY    . CESAREAN SECTION     x7  . IR ANGIO INTRA EXTRACRAN SEL  COM CAROTID INNOMINATE BILAT MOD SED  10/10/2016  . IR ANGIO VERTEBRAL SEL SUBCLAVIAN INNOMINATE UNI R MOD SED  10/10/2016  . IR ANGIOGRAM EXTREMITY LEFT  10/10/2016  . MASTECTOMY     left  . MASTECTOMY Right 2009   prophylactic  . PORT-A-CATH REMOVAL  06/01/2011   Procedure: REMOVAL PORT-A-CATH;  Surgeon: Donato Heinz, MD;  Location: [facility];  Service: General;  Laterality: N/A;  Minor Room  . portacath insertion    . RADIOLOGY WITH ANESTHESIA N/A 01/23/2017   Procedure: ANGIOPLASTY WITH STENTING;  Surgeon: Luanne Bras, MD;  Location: [facility];  Service: Radiology;  Laterality: N/A;     OB History   None      Home Medications    Prior to Admission medications   Medication Sig Start Date End Date Taking?  Authorizing Provider  ALPRAZolam Duanne Moron) 1 MG tablet Take 1 tablet (1 mg total) by mouth 2 (two) times daily as needed. for anxiety 10/14/16   Angiulli, Lavon Paganini, PA-C  amLODipine (NORVASC) 5 MG tablet Take 5 mg by mouth daily.    [provider]  aspirin 325 MG tablet Take 1 tablet (325 mg total) by mouth daily. 10/12/16   Velvet Bathe, MD  atorvastatin (LIPITOR) 10 MG tablet Take 1 tablet (10 mg total) by mouth daily at 6 PM. 10/14/16   Angiulli, Lavon Paganini, PA-C  clotrimazole (LOTRIMIN) 1 % cream Apply 1 application topically daily.    [provider]  diclofenac sodium (VOLTAREN) 1 % GEL Apply 1 application topically daily as needed (pain).    [provider]  gabapentin (NEURONTIN) 400 MG capsule Take 800 mg by mouth 3 (three) times daily.     [provider]  losartan-hydrochlorothiazide (HYZAAR) 100-12.5 MG tablet Take 1 tablet by mouth daily.    [provider]  metFORMIN (GLUCOPHAGE) 500 MG tablet Take 1 tablet (500 mg total) by mouth 2 (two) times daily with a meal. 10/14/16   Angiulli, Lavon Paganini, PA-C  metoprolol succinate (TOPROL-XL) 50 MG 24 hr tablet Take 1 tablet (50 mg total) by mouth daily. Take with or immediately following a meal. 10/14/16   Angiulli, Lavon Paganini, PA-C  montelukast (SINGULAIR) 10 MG tablet Take 10 mg by mouth at bedtime.    [provider]  Olopatadine HCl (PATADAY) 0.2 % SOLN Place 1 drop into both eyes 4 (four) times daily as needed (eye pain).     [provider]  ondansetron (ZOFRAN ODT) 8 MG disintegrating tablet Take 1 tablet (8 mg total) by mouth every 8 (eight) hours as needed for nausea or vomiting. 10/17/16   Jola Schmidt, MD  oxyCODONE (ROXICODONE) 15 MG immediate release tablet Take 1 tablet (15 mg total) by mouth every 4 (four) hours as needed for pain. 10/14/16   Angiulli, Lavon Paganini, PA-C  ticagrelor (BRILINTA) 90 MG TABS tablet Take 90 mg by mouth 2 (two) times daily.    [provider]    topiramate (TOPAMAX) 25 MG tablet Take 1 tablet (25 mg total) by mouth 2 (two) times daily. 03/20/17   Garvin Fila, MD    Family History Family History  Problem Relation Age of Onset  . Stroke Mother   . Stroke Father   . Cancer Maternal Aunt   . Cancer Maternal Grandmother   . Cancer Maternal Aunt   . Cancer Maternal Aunt     Social History Social History   Tobacco Use  . Smoking status: Current Every Day Smoker  Packs/day: 0.25    Years: 36.00    Pack years: 9.00  . Smokeless tobacco: Never Used  . Tobacco comment: 3 cigarettes a day  Substance Use Topics  . Alcohol use: Not Currently    Comment: [employer]ke social event  . Drug use: Not Currently    Types: Marijuana    Comment: in the past in the year 79     Allergies   Codeine and Penicillins   Review of Systems Review of Systems  Constitutional: Negative for activity change.       All ROS Neg except as noted in HPI  HENT: Negative for nosebleeds.   Eyes: Negative for photophobia and discharge.  Respiratory: Positive for cough. Negative for shortness of breath and wheezing.   Cardiovascular: Negative for chest pain and palpitations.  Gastrointestinal: Negative for abdominal pain and blood in stool.  Genitourinary: Negative for dysuria, frequency and hematuria.  Musculoskeletal: Positive for arthralgias. Negative for back pain and neck pain.  Skin: Negative.   Neurological: Positive for weakness. Negative for dizziness, seizures and speech difficulty.  Psychiatric/Behavioral: Negative for confusion and hallucinations.     Physical Exam Updated Vital Signs BP 109/61 (BP Location: Left Arm)   Pulse (!) 104   Temp (!) 100.5 F (38.1 C) (Oral)   Resp 20   Ht 5\' 9"  (1.753 m)   SpO2 95%   BMI 26.43 kg/m   Physical Exam  Constitutional: She is oriented to person, place, and time. She appears well-developed and well-nourished.  Non-toxic appearance.  HENT:  Head: Normocephalic.  Right Ear:  Tympanic membrane and external ear normal.  Left Ear: Tympanic membrane and external ear normal.  Eyes: Pupils are equal, round, and reactive to light. EOM and lids are normal.  Neck: Normal range of motion. Neck supple. Carotid bruit is not present.  Cardiovascular: Regular rhythm, normal heart sounds, intact distal pulses and normal pulses. Tachycardia present. Exam reveals no gallop and no friction rub.  Pulmonary/Chest: No respiratory distress. She has rhonchi.  There is symmetrical rise and fall of the chest.  The patient speaks in complete sentences without problem.  There are bilateral rhonchi noted.  Left greater than right.  Coarse breath sounds throughout the lung fields.  Abdominal: Soft. Bowel sounds are normal. There is no tenderness. There is no guarding.  Musculoskeletal: Normal range of motion.  Lymphadenopathy:       Head (right side): No submandibular adenopathy present.       Head (left side): No submandibular adenopathy present.    She has no cervical adenopathy.  Neurological: She is alert and oriented to person, place, and time. She has normal strength. No cranial nerve deficit or sensory deficit.   The patient is awake and alert.  Oriented to person place and situation.  There is some weakness of the left upper and lower extremity.  Patient states that this is the same that it has been since her stroke. Did not test gait at this time.  Skin: Skin is warm and dry.  Psychiatric: She has a normal mood and affect. Her speech is normal.  Nursing note and vitals reviewed.    ED Treatments / Results  Labs (all labs ordered are listed, but only abnormal results are displayed) Labs Reviewed - No data to display  EKG None  Radiology No results found.  Procedures Procedures (including critical care time)  Medications Ordered in ED Medications - No data to display   Initial Impression / Assessment and  Plan / ED Course  I have reviewed the triage vital signs and the  nursing notes.  Pertinent labs & imaging results that were available during my care of the patient were reviewed by me and considered in my medical decision making (see chart for details).       Final Clinical Impressions(s) / ED Diagnoses MDM  Patient noted to have a temperature elevation of 100.5, and a pulse rate of 104.  Pulse oximetry is 95% on room air.  Electrocardiogram shows a tachycardia present.  There are no life-threatening arrhythmias present no evidence of a STEMI.  Patient treated with Tylenol extra strength for the temperature elevation.  Temperature down to 98.6, and heart rate is now down to 85. Chest x-ray shows a large rounded opacity in the left lower lobe of the lung.  There is question if this is a pulmonary mass versus a pneumonia.  A CT scan with contrast has been requested.  Complete blood count shows the white blood cells to be normal at 10,100.  The hemoglobin is low at 8.6, the hematocrit is low at 29.  Platelets are elevated at 540,000.  There is no noted shift to the left. The hemoglobin is significantly lower than previous evaluation hemoglobin was 11.  Potassium down to 2.9.  The creatinine is elevated at 1.43.  This is also significantly higher than the patient's previous evaluation. The CT scan of the chest shows a 6.9 cm centrally necrotic left lower lobe mass abutting the pleura/left chest.  There is also suspicious area for bronchogenic neoplasm.  There is a necrotic 14 mm right infrahilar node suspicious for nodal metastasis. CT scan of the abdomen shows multiple enhancing renal lesions suggesting pyelonephritis with possible early renal abscess measuring up to 3.5 cm on the left.  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  Patient has a history of subclavian steal syndrome.  I have discussed the case with the patient.  I have called a code sepsis given the possibility of pyelonephritis, changes on the complete  blood count, and soft blood pressures.  Before the orders for the code sepsis could be initiated, and for the patient would allow me to notify the admitting hospitalist, she said she had to leave to take care of things at home.  I discussed with her the danger of leaving before the work-up was complete and the treatment was started.  The patient states that she accepts responsibility, but she has to leave at this time.  I again discussed with the patient the dangers of leaving and urged her to allow someone else to take care of these issues, but she is adamant that she has to leave.  Patient chose to sign out of the emergency department [place].  She says that she will return.  I have invited the patient to return if she changes her mind.    Final diagnoses:  Lung mass  Pyelonephritis  Hypokalemia    ED Discharge Orders    None       Lily Kocher, Hershal Coria 10/25/17 2043    Milton Ferguson, MD 10/26/17 [phone]

## 2017-10-25 NOTE — Progress Notes (Signed)
Pharmacy Antibiotic Note  KAYLEY ZEIDERS is a 57 y.o. female admitted on 10/25/2017 with sepsis and UTI.  Pharmacy has been consulted for meropenem dosing.  Plan: Meropenem 1gm IV every 12 hours. Monitor labs, micro and vitals.   Height: 5\' 9"  (175.3 cm) Weight: 190 lb (86.2 kg) IBW/kg (Calculated) : 66.2  Temp (24hrs), Avg:99.1 F (37.3 C), Min:98.2 F (36.8 C), Max:100.5 F (38.1 C)  Recent Labs  Lab 10/25/17 1018 10/25/17 1851  WBC 10.1 10.6*  CREATININE 1.43* 1.63*    Estimated Creatinine Clearance: 45.1 mL/min (A) (by C-G formula based on SCr of 1.63 mg/dL (H)).    Allergies  Allergen Reactions  . Codeine Anaphylaxis and Rash  . Penicillins Anaphylaxis, Rash and Other (See Comments)    Has patient had a PCN reaction causing immediate rash, facial/tongue/throat swelling, SOB or lightheadedness with hypotension: Yes Has patient had a PCN reaction causing severe rash involving mucus membranes or skin necrosis: No Has patient had a PCN reaction that required hospitalization: Yes Has patient had a PCN reaction occurring within the last 10 years: No If all of the above answers are "NO", then may proceed with Cephalosporin use.    Antimicrobials this admission: Levaquin 9/4 x 1(ordered) Meropenem 9/4 >>   Dose adjustments this admission: n/a   Microbiology results: 9/4 BCx: pending  UCx:    Sputum:    MRSA PCR:   Thank you for allowing pharmacy to be a part of this patient's care.  Pricilla Larsson 10/25/2017 9:12 PM

## 2017-10-25 NOTE — ED Provider Notes (Signed)
[facility] EMERGENCY DEPARTMENT Provider Note   CSN: [id number] Arrival date & time: 10/25/17  1704     History   Chief Complaint Chief Complaint  Patient presents with  . Lung Mass    HPI [name] is a 57 y.o. female.  HPI Sheet was seen in the emergency room earlier today for arm pain.  Patient was noted to have a temperature up to 100.5 and a pulse rate of 104.  Her work-up ended up showing a large rounded opacity in the left lower lung concerning for pneumonia versus lung mass.  CT CAT scan with contrast was requested.  Patient however ended up leaving AMA prior to completing that evaluation.  Patient had laboratory test that showed a drop her hemoglobin from 11.9 down to 8.6.  Patient's electrolyte panel also showed an elevated creatinine of 1.43 and hypokalemia with potassium of 2.9. Past Medical History:  Diagnosis Date  . Asthma   . Back muscle spasm 12/13/2010  . Cancer [facility])    breast cancer, remission 2010  . Chronic back pain   . Chronic foot pain   . COPD (chronic obstructive pulmonary disease) ([place]) 09/14/2010  . Diabetes mellitus without complication ([place])   . H/O MRSA infection 09/14/2010  . High blood cholesterol   . Hyperlipidemia 12/07/2012  . Hypertension   . Lung mass   . 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 Memory loss   . Psoriasis 12/13/2010  . Stroke ([place])   . Tobacco abuse 12/07/2012    Patient Active Problem List   Diagnosis Date Noted  . Acute bilat watershed infarction [facility]) 10/11/2016  . 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 Cerebral embolism with cerebral infarction 10/09/2016  . CVA (cerebral vascular accident) ([place]) 10/09/2016  . Chronic pain syndrome 10/09/2016  . Diabetes mellitus type 2 in obese ([place]) 10/09/2016  . AKI (acute kidney injury) ([place])   . Right-sided muscle weakness   . Eye drainage 02/28/2014  . Conjunctivitis 02/28/2014  . Back pain 02/28/2014  . Hyperlipidemia 12/07/2012  . Tobacco abuse  12/07/2012  . Psoriasis 12/13/2010  . COPD (chronic obstructive pulmonary disease) ([place]) 09/14/2010  . 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 H/O MRSA infection 09/14/2010  . Anxiety state 05/13/2009  . DEPRESSION 05/13/2009  . Essential hypertension 05/13/2009  . H/O Stage III Breast cancer, left 05/13/2009    Past Surgical History:  Procedure Laterality Date  . ABDOMINAL HYSTERECTOMY    . CESAREAN SECTION     x7  . IR ANGIO INTRA EXTRACRAN SEL COM CAROTID INNOMINATE BILAT MOD SED  10/10/2016  . IR ANGIO VERTEBRAL SEL SUBCLAVIAN INNOMINATE UNI R MOD SED  10/10/2016  . IR ANGIOGRAM EXTREMITY LEFT  10/10/2016  . MASTECTOMY     left  . MASTECTOMY Right 2009   prophylactic  . PORT-A-CATH REMOVAL  06/01/2011   Procedure: REMOVAL PORT-A-CATH;  Surgeon: Donato Heinz, MD;  Location: [facility];  Service: General;  Laterality: N/A;  Minor Room  . portacath insertion    . RADIOLOGY WITH ANESTHESIA N/A 01/23/2017   Procedure: ANGIOPLASTY WITH STENTING;  Surgeon: Luanne Bras, MD;  Location: [place];  Service: Radiology;  Laterality: N/A;     OB History   None      Home Medications    Prior to Admission medications   Medication Sig Start Date End Date Taking? Authorizing Provider  ALPRAZolam Duanne Moron) 1 MG tablet Take 1 tablet (1 mg total) by mouth 2 (two) times  daily as needed. for anxiety 10/14/16   Angiulli, Lavon Paganini, PA-C  amLODipine (NORVASC) 5 MG tablet Take 5 mg by mouth daily.    [provider]  aspirin 325 MG tablet Take 1 tablet (325 mg total) by mouth daily. 10/12/16   Velvet Bathe, MD  atorvastatin (LIPITOR) 10 MG tablet Take 1 tablet (10 mg total) by mouth daily at 6 PM. 10/14/16   Angiulli, Lavon Paganini, PA-C  cetirizine (ZYRTEC) 10 MG tablet Take 10 mg by mouth at bedtime.    [provider]  diclofenac sodium (VOLTAREN) 1 % GEL Apply 1 application topically daily as needed (pain).    [provider]  famotidine (PEPCID) 20 MG tablet Take 20 mg  by mouth 2 (two) times daily.    [provider]  gabapentin (NEURONTIN) 400 MG capsule Take 800 mg by mouth 3 (three) times daily.     [provider]  losartan-hydrochlorothiazide (HYZAAR) 100-12.5 MG tablet Take 1 tablet by mouth daily.    [provider]  metFORMIN (GLUCOPHAGE) 500 MG tablet Take 1 tablet (500 mg total) by mouth 2 (two) times daily with a meal. Patient not taking: Reported on 10/25/2017 10/14/16   Angiulli, Lavon Paganini, PA-C  metoprolol succinate (TOPROL-XL) 50 MG 24 hr tablet Take 1 tablet (50 mg total) by mouth daily. Take with or immediately following a meal. Patient not taking: Reported on 10/25/2017 10/14/16   Angiulli, Lavon Paganini, PA-C  montelukast (SINGULAIR) 10 MG tablet Take 10 mg by mouth at bedtime.    [provider]  Olopatadine HCl (PATADAY) 0.2 % SOLN Place 1 drop into both eyes 4 (four) times daily as needed (eye pain).     [provider]  ondansetron (ZOFRAN ODT) 8 MG disintegrating tablet Take 1 tablet (8 mg total) by mouth every 8 (eight) hours as needed for nausea or vomiting. Patient not taking: Reported on 10/25/2017 10/17/16   Jola Schmidt, MD  oxyCODONE (ROXICODONE) 15 MG immediate release tablet Take 1 tablet (15 mg total) by mouth every 4 (four) hours as needed for pain. 10/14/16   Angiulli, Lavon Paganini, PA-C  ticagrelor (BRILINTA) 90 MG TABS tablet Take 90 mg by mouth 2 (two) times daily.    [provider]  topiramate (TOPAMAX) 25 MG tablet Take 1 tablet (25 mg total) by mouth 2 (two) times daily. Patient not taking: Reported on 10/25/2017 03/20/17   Garvin Fila, MD    Family History Family History  Problem Relation Age of Onset  . Stroke Mother   . Stroke Father   . Cancer Maternal Aunt   . Cancer Maternal Grandmother   . Cancer Maternal Aunt   . Cancer Maternal Aunt     Social History Social History   Tobacco Use  . Smoking status: Current Every Day Smoker    Packs/day: 0.25    Years: 36.00     Pack years: 9.00  . Smokeless tobacco: Never Used  . Tobacco comment: 3 cigarettes a day  Substance Use Topics  . Alcohol use: Not Currently    Comment: [employer] social event  . Drug use: Not Currently    Types: Marijuana    Comment: in the past in the year 79     Allergies   Codeine and Penicillins   Review of Systems Review of Systems  All other systems reviewed and are negative.    Physical Exam Updated Vital Signs BP 109/66 (BP Location: Right Arm)   Pulse 91  Temp 98.2 F (36.8 C) (Oral)   Resp 20   Ht 1.753 m (5\' 9" )   Wt 86.2 kg   SpO2 94%   BMI 28.06 kg/m   Physical Exam  Constitutional: She appears well-developed and well-nourished. No distress.  HENT:  Head: Normocephalic and atraumatic.  Right Ear: External ear normal.  Left Ear: External ear normal.  Eyes: Conjunctivae are normal. Right eye exhibits no discharge. Left eye exhibits no discharge. No scleral icterus.  Neck: Neck supple. No tracheal deviation present.  Cardiovascular: Normal rate, regular rhythm and intact distal pulses.  Pulmonary/Chest: Effort normal and breath sounds normal. No stridor. No respiratory distress. She has no wheezes. She has no rales.  Abdominal: Soft. Bowel sounds are normal. She exhibits no distension. There is no tenderness. There is no rebound and no guarding.  Musculoskeletal: She exhibits edema (mild pitting edema bilateral ankles). She exhibits no tenderness.  Neurological: She is alert. She has normal strength. No cranial nerve deficit (no facial droop, extraocular movements intact, no slurred speech) or sensory deficit. She exhibits normal muscle tone. She displays no seizure activity. Coordination normal.  Skin: Skin is warm and dry. No rash noted.  Psychiatric: She has a normal mood and affect.  Nursing note and vitals reviewed.    ED Treatments / Results  Labs (all labs ordered are listed, but only abnormal results are displayed) Labs Reviewed  BASIC  METABOLIC PANEL - Abnormal; Notable for the following components:      Result Value   Potassium 3.4 (*)    Chloride 95 (*)    Glucose, Bld 115 (*)    Creatinine, Ser 1.63 (*)    GFR calc non Af Amer 34 (*)    GFR calc Af Amer 40 (*)    All other components within normal limits  CBC - Abnormal; Notable for the following components:   WBC 10.6 (*)    RBC 3.69 (*)    Hemoglobin 8.9 (*)    HCT 29.2 (*)    MCH 24.1 (*)    RDW 20.3 (*)    Platelets 596 (*)    All other components within normal limits  CULTURE, BLOOD (ROUTINE X 2)  CULTURE, BLOOD (ROUTINE X 2)  POC OCCULT BLOOD, ED    EKG None  Radiology Dg Chest 2 View  Result Date: 10/25/2017 CLINICAL DATA:  Cough, fever EXAM: CHEST - 2 VIEW COMPARISON:  None. FINDINGS: There is a large rounded opacity in the left lower lobe. There is no other focal parenchymal opacity. There is no pleural effusion or pneumothorax. The heart and mediastinal contours are unremarkable. The osseous structures are unremarkable. IMPRESSION: Large rounded opacity in the left lower lobe. This may reflect pneumonia versus a pulmonary mass. Recommend further evaluation with a CT of the chest with intravenous contrast. Electronically Signed   By: Kathreen Devoid   On: 10/25/2017 09:46   Ct Chest W Contrast  Result Date: 10/25/2017 CLINICAL DATA:  Cough, fever, left lower lobe opacity on chest radiograph. Left flank pain, fever. EXAM: CT CHEST, ABDOMEN, AND PELVIS WITH CONTRAST TECHNIQUE: Multidetector CT imaging of the chest, abdomen and pelvis was performed following the standard protocol during bolus administration of intravenous contrast. CONTRAST:  36mL OMNIPAQUE IOHEXOL 300 MG/ML  SOLN COMPARISON:  Chest radiographs dated 10/25/2017. CT chest abdomen pelvis dated 10/05/2012. FINDINGS: CT CHEST FINDINGS Cardiovascular: Heart is normal in size.  No pericardial effusion. No evidence of thoracic aortic aneurysm. Atherosclerotic calcifications of the aortic arch.  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 No suspicious mediastinal lymphadenopathy. 14 mm short axis low-density left infrahilar node (series 2/image 78), suspicious. Visualized thyroid is unremarkable. Mediastinum/Nodes: No suspicious mediastinal lymphadenopathy. Necrotic 14 mm short axis left infrahilar node (series 2/image 78), suspicious. Visualized thyroid is unremarkable. Lungs/Pleura: 6.9 x 5.2 x 6.8 cm centrally necrotic left lower lobe mass, abutting the pleura/left chest wall, highly suspicious for primary bronchogenic neoplasm. Mass abuts the left fissure. Mild biapical pleural-parenchymal scarring. No focal consolidation. No pleural effusion or pneumothorax. Musculoskeletal: Mild degenerative changes of the mid thoracic spine. CT ABDOMEN PELVIS FINDINGS Hepatobiliary: Liver is within normal limits, noting focal fat/altered perfusion along the falciform ligament (series 15/image 23). Gallbladder is underdistended. No intrahepatic or extrahepatic ductal dilatation. Pancreas: Within normal limits. Spleen: Cystic lesions in the spleen measuring up to 2.2 cm, likely benign. Adrenals/Urinary Tract: Adrenal glands are within normal limits. Multiple hypoenhancing renal lesions, suggesting pyelonephritis, with possible early renal abscesses measuring up to 3.5 cm in the left upper kidney (series 15/image 29) and 3.1 cm in the right upper kidney (series 15/image 35). No hydronephrosis. Bladder is within normal limits. Stomach/Bowel: Stomach is within normal limits. No evidence of bowel obstruction. Appendix is not discretely visualized. Vascular/Lymphatic: No evidence of abdominal aortic aneurysm. Atherosclerotic calcifications of the abdominal aorta and branch vessels. No suspicious abdominopelvic lymphadenopathy. Reproductive: Status post hysterectomy. No adnexal masses. Other: No abdominopelvic ascites. Musculoskeletal: Mild degenerative changes at L5-S1. IMPRESSION: 6.9 cm  centrally necrotic left lower lobe mass, abutting the pleura/left chest wall and left fissure, suspicious for primary bronchogenic neoplasm. Necrotic 14 mm left infrahilar node, suspicious for nodal metastasis. Multiple hypoenhancing renal lesion, suggesting pyelonephritis, with possible early renal abscesses measuring up to 3.5 cm on the left. Technically metastases or lymphoma could have a similar appearance, although this is considered unlikely given the clinical symptoms. Follow-up CT or MRI abdomen with/without contrast is suggested in 3-4 weeks after appropriate antimicrobial therapy to ensure resolution. 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 Electronically Signed   By: Julian Hy M.D.   On: 10/25/2017 13:16   Ct Abdomen Pelvis W Contrast  Result Date: 10/25/2017 CLINICAL DATA:  Cough, fever, left lower lobe opacity on chest radiograph. Left flank pain, fever. EXAM: CT CHEST, ABDOMEN, AND PELVIS WITH CONTRAST TECHNIQUE: Multidetector CT imaging of the chest, abdomen and pelvis was performed following the standard protocol during bolus administration of intravenous contrast. CONTRAST:  77mL OMNIPAQUE IOHEXOL 300 MG/ML  SOLN COMPARISON:  Chest radiographs dated 10/25/2017. CT chest abdomen pelvis dated 10/05/2012. FINDINGS: CT CHEST FINDINGS Cardiovascular: Heart is normal in size.  No pericardial effusion. No evidence of thoracic aortic aneurysm. Atherosclerotic calcifications of the aortic arch. 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 No suspicious mediastinal lymphadenopathy. 14 mm short axis low-density left infrahilar node (series 2/image 78), suspicious. Visualized thyroid is unremarkable. Mediastinum/Nodes: No suspicious mediastinal lymphadenopathy. Necrotic 14 mm short axis left infrahilar node (series 2/image 78), suspicious. Visualized thyroid is unremarkable. Lungs/Pleura: 6.9 x 5.2 x 6.8 cm centrally necrotic left lower lobe  mass, abutting the pleura/left chest wall, highly suspicious for primary bronchogenic neoplasm. Mass abuts the left fissure. Mild biapical pleural-parenchymal scarring. No focal consolidation. No pleural effusion or pneumothorax. Musculoskeletal: Mild degenerative changes of the mid thoracic spine. CT ABDOMEN PELVIS FINDINGS Hepatobiliary: Liver is within normal limits, noting focal fat/altered perfusion along the falciform ligament (series 15/image 23). Gallbladder is underdistended. No intrahepatic  or extrahepatic ductal dilatation. Pancreas: Within normal limits. Spleen: Cystic lesions in the spleen measuring up to 2.2 cm, likely benign. Adrenals/Urinary Tract: Adrenal glands are within normal limits. Multiple hypoenhancing renal lesions, suggesting pyelonephritis, with possible early renal abscesses measuring up to 3.5 cm in the left upper kidney (series 15/image 29) and 3.1 cm in the right upper kidney (series 15/image 35). No hydronephrosis. Bladder is within normal limits. Stomach/Bowel: Stomach is within normal limits. No evidence of bowel obstruction. Appendix is not discretely visualized. Vascular/Lymphatic: No evidence of abdominal aortic aneurysm. Atherosclerotic calcifications of the abdominal aorta and branch vessels. No suspicious abdominopelvic lymphadenopathy. Reproductive: Status post hysterectomy. No adnexal masses. Other: No abdominopelvic ascites. Musculoskeletal: Mild degenerative changes at L5-S1. IMPRESSION: 6.9 cm centrally necrotic left lower lobe mass, abutting the pleura/left chest wall and left fissure, suspicious for primary bronchogenic neoplasm. Necrotic 14 mm left infrahilar node, suspicious for nodal metastasis. Multiple hypoenhancing renal lesion, suggesting pyelonephritis, with possible early renal abscesses measuring up to 3.5 cm on the left. Technically metastases or lymphoma could have a similar appearance, although this is considered unlikely given the clinical symptoms.  Follow-up CT or MRI abdomen with/without contrast is suggested in 3-4 weeks after appropriate antimicrobial therapy to ensure resolution. 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 Electronically Signed   By: Julian Hy M.D.   On: 10/25/2017 13:16    Procedures Procedures (including critical care time)  Medications Ordered in ED Medications  levofloxacin (LEVAQUIN) IVPB 750 mg (has no administration in time range)  sodium chloride 0.9 % bolus 1,000 mL (1,000 mLs Intravenous New Bag/Given 10/25/17 1933)  potassium chloride 10 mEq in 100 mL IVPB (10 mEq Intravenous New Bag/Given 10/25/17 1935)  potassium chloride SA (K-DUR,KLOR-CON) CR tablet 40 mEq (40 mEq Oral Given 10/25/17 1933)     Initial Impression / Assessment and Plan / ED Course  I have reviewed the triage vital signs and the nursing notes.  Pertinent labs & imaging results that were available during my care of the patient were reviewed by me and considered in my medical decision making (see chart for details).   Patient was seen earlier in the day.  Her evaluation was notable for a lung mass concerning for the possibility of either pneumonia or carcinoma.  Patient ended up getting a CT scan of her chest she left take care of something at home with plans to come back.  Patient CT scan ended up showing findings concerning for a primary lung cancer.  There is also abnormalities noted in the kidneys concerning for possible renal abscess or pyelonephritis.  Patient has had a low-grade temperature.  Urinalysis still pending.  Antibiotics were ordered.  I will consult the medical service for admission and further work-up.  Final Clinical Impressions(s) / ED Diagnoses   Final diagnoses:  Renal abscess  Lung mass  Anemia, unspecified type      Dorie Rank, MD 10/25/17 2016

## 2017-10-25 NOTE — ED Notes (Signed)
Pt reports "coughing up black stuff" to charge RN. PA notified and aware.

## 2017-10-25 NOTE — ED Notes (Signed)
Pt upset because she can't have food,, States,"she can buy her own food".  I reminded her that her nurse is aware and as soon as shes able to have something to eat, we will get something for her.

## 2017-10-25 NOTE — ED Notes (Addendum)
Attempted to obtain second IV access and complete code sepsis orders. Unsuccessful attempt in left forearm. Pt tearful and reports "I cant stay I have stuff to get done and get my clothes." pt consoled by ED staff and informed would not need clothes and that family could bring them tomorrow. Pt agitated and states "I will leave and come back, this is between me and my doctor."   Pt refusing labs and ultrasound IV at this time.  Unit secretary providing pt with meal tray per PA approval.  PA at bedside talking with pt.

## 2017-10-25 NOTE — ED Triage Notes (Signed)
Pt was seen earlier for a lung mass and left AMA. Now back for admission.

## 2017-10-25 NOTE — H&P (Signed)
History and Physical    [name] SUP:[id number] DOB: 05/13/1960 DOA: 10/25/2017  PCP: Javier Docker, MD   Patient coming from: Home   Chief Complaint: Productive cough, malaise, fatigue, chills   HPI: [name] is a 57 y.o. female with medical history significant for history of breast cancer status post mastectomies and chemotherapy, COPD, type 2 diabetes mellitus, chronic pain, anxiety, hypertension, and history of CVA, now presenting to the emergency department for evaluation of productive cough, malaise, fatigue, and chills.  Patient reports that she developed a nonspecific malaise and fatigue insidiously that is worsened over the past week.  She has also developed a cough productive of dark, blackish sputum over the same interval.  She reports mild shortness of breath.  Reports chronic unchanged swelling in the lower extremities.  Denies chest pain or palpitations.  Reports chills for the past couple days.  Denies any significant abdominal pain and denies vomiting or diarrhea.  She has not noted any melena or hematochezia.  ED Course: Upon arrival to the ED, patient is found to be febrile to 38.1 C, saturating upper 80s on room air, slightly tachycardic, and with blood pressure 92/53.  EKG features a sinus rhythm with PAC.  Chemistry panel is notable for potassium 3.4 and creatinine 1.63, up from 0.83 last December.  CBC features a mild leukocytosis, thrombocytosis, and anemia with hemoglobin of 8.9, down from 11.9 in December.  Chest x-ray was concerning for mass versus pneumonia in the left lower lobe and CT chest reveals a centrally necrotic left lower lobe mass suspicious for primary bronchogenic neoplasm and an associated left infrahilar necrotic node concerning for nodal metastasis.  CT of the abdomen and pelvis was performed with notation of left renal lesions concerning for pyelonephritis with possible early abscess.  Blood cultures were collected, liter of  normal saline was given, and the patient was treated with potassium and a dose of Levaquin.  Patient will require admission for ongoing evaluation and management of sepsis suspected secondary to pyelonephritis, as well as a lung mass and necrotic infrahilar node concerning for malignancy.   Review of Systems:  All other systems reviewed and apart from HPI, are negative.  Past Medical History:  Diagnosis Date  . Asthma   . Back muscle spasm 12/13/2010  . Cancer [facility])    breast cancer, remission 2010  . Chronic back pain   . Chronic foot pain   . COPD (chronic obstructive pulmonary disease) ([place]) 09/14/2010  . Diabetes mellitus without complication ([place])   . H/O MRSA infection 09/14/2010  . High blood cholesterol   . Hyperlipidemia 12/07/2012  . Hypertension   . Lung mass   . 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 Memory loss   . Psoriasis 12/13/2010  . Stroke ([place])   . Tobacco abuse 12/07/2012    Past Surgical History:  Procedure Laterality Date  . ABDOMINAL HYSTERECTOMY    . CESAREAN SECTION     x7  . IR ANGIO INTRA EXTRACRAN SEL COM CAROTID INNOMINATE BILAT MOD SED  10/10/2016  . IR ANGIO VERTEBRAL SEL SUBCLAVIAN INNOMINATE UNI R MOD SED  10/10/2016  . IR ANGIOGRAM EXTREMITY LEFT  10/10/2016  . MASTECTOMY     left  . MASTECTOMY Right 2009   prophylactic  . PORT-A-CATH REMOVAL  06/01/2011   Procedure: REMOVAL PORT-A-CATH;  Surgeon: Donato Heinz, MD;  Location: [facility];  Service: General;  Laterality: N/A;  Minor Room  . portacath insertion    .  RADIOLOGY WITH ANESTHESIA N/A 01/23/2017   Procedure: ANGIOPLASTY WITH STENTING;  Surgeon: Luanne Bras, MD;  Location: [place];  Service: Radiology;  Laterality: N/A;     reports that she has been smoking. She has a 9.00 pack-year smoking history. She has never used smokeless tobacco. She reports that she drank alcohol. She reports that she has current or past drug history. Drug: Marijuana.  Allergies  Allergen Reactions  .  Codeine Anaphylaxis and Rash  . Penicillins Anaphylaxis, Rash and Other (See Comments)    Has patient had a PCN reaction causing immediate rash, facial/tongue/throat swelling, SOB or lightheadedness with hypotension: Yes Has patient had a PCN reaction causing severe rash involving mucus membranes or skin necrosis: No Has patient had a PCN reaction that required hospitalization: Yes Has patient had a PCN reaction occurring within the last 10 years: No If all of the above answers are "NO", then may proceed with Cephalosporin use.     Family History  Problem Relation Age of Onset  . Stroke Mother   . Stroke Father   . Cancer Maternal Aunt   . Cancer Maternal Grandmother   . Cancer Maternal Aunt   . Cancer Maternal Aunt      Prior to Admission medications   Medication Sig Start Date End Date Taking? Authorizing Provider  ALPRAZolam Duanne Moron) 1 MG tablet Take 1 tablet (1 mg total) by mouth 2 (two) times daily as needed. for anxiety 10/14/16   Angiulli, Lavon Paganini, PA-C  amLODipine (NORVASC) 5 MG tablet Take 5 mg by mouth daily.    [provider]  aspirin 325 MG tablet Take 1 tablet (325 mg total) by mouth daily. 10/12/16   Velvet Bathe, MD  atorvastatin (LIPITOR) 10 MG tablet Take 1 tablet (10 mg total) by mouth daily at 6 PM. 10/14/16   Angiulli, Lavon Paganini, PA-C  cetirizine (ZYRTEC) 10 MG tablet Take 10 mg by mouth at bedtime.    [provider]  diclofenac sodium (VOLTAREN) 1 % GEL Apply 1 application topically daily as needed (pain).    [provider]  famotidine (PEPCID) 20 MG tablet Take 20 mg by mouth 2 (two) times daily.    [provider]  gabapentin (NEURONTIN) 400 MG capsule Take 800 mg by mouth 3 (three) times daily.     [provider]  losartan-hydrochlorothiazide (HYZAAR) 100-12.5 MG tablet Take 1 tablet by mouth daily.    [provider]  metFORMIN (GLUCOPHAGE) 500 MG tablet Take 1 tablet (500 mg total) by mouth 2 (two) times  daily with a meal. Patient not taking: Reported on 10/25/2017 10/14/16   Angiulli, Lavon Paganini, PA-C  metoprolol succinate (TOPROL-XL) 50 MG 24 hr tablet Take 1 tablet (50 mg total) by mouth daily. Take with or immediately following a meal. Patient not taking: Reported on 10/25/2017 10/14/16   Angiulli, Lavon Paganini, PA-C  montelukast (SINGULAIR) 10 MG tablet Take 10 mg by mouth at bedtime.    [provider]  Olopatadine HCl (PATADAY) 0.2 % SOLN Place 1 drop into both eyes 4 (four) times daily as needed (eye pain).     [provider]  ondansetron (ZOFRAN ODT) 8 MG disintegrating tablet Take 1 tablet (8 mg total) by mouth every 8 (eight) hours as needed for nausea or vomiting. Patient not taking: Reported on 10/25/2017 10/17/16   Jola Schmidt, MD  oxyCODONE (ROXICODONE) 15 MG immediate release tablet Take 1 tablet (15 mg total) by mouth every 4 (four) hours as needed for  pain. 10/14/16   Angiulli, Lavon Paganini, PA-C  ticagrelor (BRILINTA) 90 MG TABS tablet Take 90 mg by mouth 2 (two) times daily.    [provider]  topiramate (TOPAMAX) 25 MG tablet Take 1 tablet (25 mg total) by mouth 2 (two) times daily. Patient not taking: Reported on 10/25/2017 03/20/17   Garvin Fila, MD    Physical Exam: Vitals:   10/25/17 1725 10/25/17 1727 10/25/17 2000  BP:  109/66   Pulse:  91 84  Resp:  20 19  Temp:  98.2 F (36.8 C)   TempSrc:  Oral   SpO2:  94% 99%  Weight: 86.2 kg    Height: 5' 9"  (1.753 m)       Constitutional: NAD, rigors, anxious Eyes: PERTLA, lids and conjunctivae normal ENMT: Mucous membranes are moist. Posterior pharynx clear of any exudate or lesions.   Neck: normal, supple, no masses, no thyromegaly Respiratory: Diminished breath sounds bilaterally. No accessory muscle use.  Cardiovascular: S1 & S2 heard, regular rate and rhythm. Trace pedal edema bilaterally. Abdomen: No distension, soft, nontender. Bowel sounds normal.  Musculoskeletal: no clubbing / cyanosis. No  joint deformity upper and lower extremities.    Skin: no significant rashes, lesions, ulcers. Warm, dry, well-perfused. Neurologic: No gross facial asymmetry. Sensation intact. Moving all extremities.   Psychiatric: Alert and oriented x 3. Anxious, pleasant, cooperative.    Labs on Admission: I have personally reviewed following labs and imaging studies  CBC: Recent Labs  Lab 10/25/17 1018 10/25/17 1851  WBC 10.1 10.6*  NEUTROABS 8.0*  --   HGB 8.6* 8.9*  HCT 29.0* 29.2*  MCV 79.7 79.1  PLT 540* 161*   Basic Metabolic Panel: Recent Labs  Lab 10/25/17 1018 10/25/17 1851  NA 136 135  K 2.9* 3.4*  CL 94* 95*  CO2 33* 30  GLUCOSE 111* 115*  BUN 14 16  CREATININE 1.43* 1.63*  CALCIUM 9.2 9.1   GFR: Estimated Creatinine Clearance: 45.1 mL/min (A) (by C-G formula based on SCr of 1.63 mg/dL (H)). Liver Function Tests: No results for input(s): AST, ALT, ALKPHOS, BILITOT, PROT, ALBUMIN in the last 168 hours. No results for input(s): LIPASE, AMYLASE in the last 168 hours. No results for input(s): AMMONIA in the last 168 hours. Coagulation Profile: No results for input(s): INR, PROTIME in the last 168 hours. Cardiac Enzymes: No results for input(s): CKTOTAL, CKMB, CKMBINDEX, TROPONINI in the last 168 hours. BNP (last 3 results) No results for input(s): PROBNP in the last 8760 hours. HbA1C: No results for input(s): HGBA1C in the last 72 hours. CBG: No results for input(s): GLUCAP in the last 168 hours. Lipid Profile: No results for input(s): CHOL, HDL, LDLCALC, TRIG, CHOLHDL, LDLDIRECT in the last 72 hours. Thyroid Function Tests: No results for input(s): TSH, T4TOTAL, FREET4, T3FREE, THYROIDAB in the last 72 hours. Anemia Panel: No results for input(s): VITAMINB12, FOLATE, FERRITIN, TIBC, IRON, RETICCTPCT in the last 72 hours. Urine analysis:    Component Value Date/Time   COLORURINE YELLOW 10/08/2016 1939   APPEARANCEUR CLEAR 10/08/2016 1939   LABSPEC 1.025  10/08/2016 1939   PHURINE 5.0 10/08/2016 1939   GLUCOSEU NEGATIVE 10/08/2016 1939   HGBUR MODERATE (A) 10/08/2016 1939   BILIRUBINUR NEGATIVE 10/08/2016 1939   KETONESUR NEGATIVE 10/08/2016 1939   PROTEINUR NEGATIVE 10/08/2016 1939   UROBILINOGEN 0.2 06/01/2011 1541   NITRITE NEGATIVE 10/08/2016 1939   LEUKOCYTESUR NEGATIVE 10/08/2016 1939   Sepsis Labs: @LABRCNTIP (procalcitonin:4,lacticidven:4) ) Recent Results (from the past 240 hour(s))  Blood culture (routine x 2)     Status: None (Preliminary result)   Collection Time: 10/25/17  6:59 PM  Result Value Ref Range Status   Specimen Description RIGHT ANTECUBITAL  Final   Special Requests   Final    BOTTLES DRAWN AEROBIC AND ANAEROBIC Blood Culture adequate volume Performed at [facility], [address], [place], [place] [address]    Culture PENDING  Incomplete   Report Status PENDING  Incomplete     Radiological Exams on Admission: Dg Chest 2 View  Result Date: 10/25/2017 CLINICAL DATA:  Cough, fever EXAM: CHEST - 2 VIEW COMPARISON:  None. FINDINGS: There is a large rounded opacity in the left lower lobe. There is no other focal parenchymal opacity. There is no pleural effusion or pneumothorax. The heart and mediastinal contours are unremarkable. The osseous structures are unremarkable. IMPRESSION: Large rounded opacity in the left lower lobe. This may reflect pneumonia versus a pulmonary mass. Recommend further evaluation with a CT of the chest with intravenous contrast. Electronically Signed   By: Kathreen Devoid   On: 10/25/2017 09:46   Ct Chest W Contrast  Result Date: 10/25/2017 CLINICAL DATA:  Cough, fever, left lower lobe opacity on chest radiograph. Left flank pain, fever. EXAM: CT CHEST, ABDOMEN, AND PELVIS WITH CONTRAST TECHNIQUE: Multidetector CT imaging of the chest, abdomen and pelvis was performed following the standard protocol during bolus administration of intravenous contrast. CONTRAST:  45m OMNIPAQUE IOHEXOL 300  MG/ML  SOLN COMPARISON:  Chest radiographs dated 10/25/2017. CT chest abdomen pelvis dated 10/05/2012. FINDINGS: CT CHEST FINDINGS Cardiovascular: Heart is normal in size.  No pericardial effusion. No evidence of thoracic aortic aneurysm. Atherosclerotic calcifications of the aortic arch. 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 No suspicious mediastinal lymphadenopathy. 14 mm short axis low-density left infrahilar node (series 2/image 78), suspicious. Visualized thyroid is unremarkable. Mediastinum/Nodes: No suspicious mediastinal lymphadenopathy. Necrotic 14 mm short axis left infrahilar node (series 2/image 78), suspicious. Visualized thyroid is unremarkable. Lungs/Pleura: 6.9 x 5.2 x 6.8 cm centrally necrotic left lower lobe mass, abutting the pleura/left chest wall, highly suspicious for primary bronchogenic neoplasm. Mass abuts the left fissure. Mild biapical pleural-parenchymal scarring. No focal consolidation. No pleural effusion or pneumothorax. Musculoskeletal: Mild degenerative changes of the mid thoracic spine. CT ABDOMEN PELVIS FINDINGS Hepatobiliary: Liver is within normal limits, noting focal fat/altered perfusion along the falciform ligament (series 15/image 23). Gallbladder is underdistended. No intrahepatic or extrahepatic ductal dilatation. Pancreas: Within normal limits. Spleen: Cystic lesions in the spleen measuring up to 2.2 cm, likely benign. Adrenals/Urinary Tract: Adrenal glands are within normal limits. Multiple hypoenhancing renal lesions, suggesting pyelonephritis, with possible early renal abscesses measuring up to 3.5 cm in the left upper kidney (series 15/image 29) and 3.1 cm in the right upper kidney (series 15/image 35). No hydronephrosis. Bladder is within normal limits. Stomach/Bowel: Stomach is within normal limits. No evidence of bowel obstruction. Appendix is not discretely visualized. Vascular/Lymphatic: No evidence of abdominal aortic  aneurysm. Atherosclerotic calcifications of the abdominal aorta and branch vessels. No suspicious abdominopelvic lymphadenopathy. Reproductive: Status post hysterectomy. No adnexal masses. Other: No abdominopelvic ascites. Musculoskeletal: Mild degenerative changes at L5-S1. IMPRESSION: 6.9 cm centrally necrotic left lower lobe mass, abutting the pleura/left chest wall and left fissure, suspicious for primary bronchogenic neoplasm. Necrotic 14 mm left infrahilar node, suspicious for nodal metastasis. Multiple hypoenhancing renal lesion, suggesting pyelonephritis, with possible early renal abscesses measuring up to 3.5 cm on the left. Technically metastases or  lymphoma could have a similar appearance, although this is considered unlikely given the clinical symptoms. Follow-up CT or MRI abdomen with/without contrast is suggested in 3-4 weeks after appropriate antimicrobial therapy to ensure resolution. 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 Electronically Signed   By: Julian Hy M.D.   On: 10/25/2017 13:16   Ct Abdomen Pelvis W Contrast  Result Date: 10/25/2017 CLINICAL DATA:  Cough, fever, left lower lobe opacity on chest radiograph. Left flank pain, fever. EXAM: CT CHEST, ABDOMEN, AND PELVIS WITH CONTRAST TECHNIQUE: Multidetector CT imaging of the chest, abdomen and pelvis was performed following the standard protocol during bolus administration of intravenous contrast. CONTRAST:  42m OMNIPAQUE IOHEXOL 300 MG/ML  SOLN COMPARISON:  Chest radiographs dated 10/25/2017. CT chest abdomen pelvis dated 10/05/2012. FINDINGS: CT CHEST FINDINGS Cardiovascular: Heart is normal in size.  No pericardial effusion. No evidence of thoracic aortic aneurysm. Atherosclerotic calcifications of the aortic arch. 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 No suspicious mediastinal lymphadenopathy. 14 mm short axis low-density left infrahilar node (series 2/image 78),  suspicious. Visualized thyroid is unremarkable. Mediastinum/Nodes: No suspicious mediastinal lymphadenopathy. Necrotic 14 mm short axis left infrahilar node (series 2/image 78), suspicious. Visualized thyroid is unremarkable. Lungs/Pleura: 6.9 x 5.2 x 6.8 cm centrally necrotic left lower lobe mass, abutting the pleura/left chest wall, highly suspicious for primary bronchogenic neoplasm. Mass abuts the left fissure. Mild biapical pleural-parenchymal scarring. No focal consolidation. No pleural effusion or pneumothorax. Musculoskeletal: Mild degenerative changes of the mid thoracic spine. CT ABDOMEN PELVIS FINDINGS Hepatobiliary: Liver is within normal limits, noting focal fat/altered perfusion along the falciform ligament (series 15/image 23). Gallbladder is underdistended. No intrahepatic or extrahepatic ductal dilatation. Pancreas: Within normal limits. Spleen: Cystic lesions in the spleen measuring up to 2.2 cm, likely benign. Adrenals/Urinary Tract: Adrenal glands are within normal limits. Multiple hypoenhancing renal lesions, suggesting pyelonephritis, with possible early renal abscesses measuring up to 3.5 cm in the left upper kidney (series 15/image 29) and 3.1 cm in the right upper kidney (series 15/image 35). No hydronephrosis. Bladder is within normal limits. Stomach/Bowel: Stomach is within normal limits. No evidence of bowel obstruction. Appendix is not discretely visualized. Vascular/Lymphatic: No evidence of abdominal aortic aneurysm. Atherosclerotic calcifications of the abdominal aorta and branch vessels. No suspicious abdominopelvic lymphadenopathy. Reproductive: Status post hysterectomy. No adnexal masses. Other: No abdominopelvic ascites. Musculoskeletal: Mild degenerative changes at L5-S1. IMPRESSION: 6.9 cm centrally necrotic left lower lobe mass, abutting the pleura/left chest wall and left fissure, suspicious for primary bronchogenic neoplasm. Necrotic 14 mm left infrahilar node, suspicious for  nodal metastasis. Multiple hypoenhancing renal lesion, suggesting pyelonephritis, with possible early renal abscesses measuring up to 3.5 cm on the left. Technically metastases or lymphoma could have a similar appearance, although this is considered unlikely given the clinical symptoms. Follow-up CT or MRI abdomen with/without contrast is suggested in 3-4 weeks after appropriate antimicrobial therapy to ensure resolution. 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 Electronically Signed   By: SJulian HyM.D.   On: 10/25/2017 13:16    EKG: Independently reviewed. Sinus rhythm, PAC.   Assessment/Plan   1. Sepsis secondary to pyelonephritis  - Presents with productive cough, chills, fatigue, malaise  - Found to be febrile and tachycardic with leukocytosis  - CT abd/pelvis with left renal lesions concerning for pyelonephritis and possible early abscess  - Blood cultures collected in ED, UA and urine culture pending  - Treat empirically with meropenem  in light of reported drug allergies, follow cultures and clinical course    2. Lung mass  - Presents with cough productive of dark sputum  - CT chest with centrally necrotic mass in LLL concerning for primary bronchogenic neoplasm, and necrotic left infrahilar node suggestive of nodal met  - Findings discussed with patient  - Workup inpatient vs outpatient    3. Renal insufficiency  - SCr is 1.63 on admission, up from 0.83 last December; uncertain chronicity  - No hydronephrosis on CT   - Check urine chemistries, hold losartan-HCTZ, continue IVF hydration, repeat chem panel in am    4. Anemia  - Hgb is 8.9 on admission, down from 11.9 last December  - FOBT is negative  - Likely d/t chronic disease, malignancy  - Type and screen, check anemia panel    5. History of breast cancer  - Status-post bilateral mastectomy and chemotherapy  - Had been following with oncology for surveillance, but has not been keeping  appointments   6. Type II DM  - A1c was 6.2% last year  - Managed at home with metformin, held on admission  - Check CBG's and use a low-intensity SSI with Novolog as needed    7. Chronic pain  - Continue home regimen with gabapentin, prn oxycodone, prn Voltaren gel    8. Anxiety  - Continue prn Xanax   9. Hypertension  - BP on low end of normal in ED and antihypertensives held initially    10. Hypokalemia  - Serum potassium is 3.4 on admission  - Treated in ED with 10 mEq IV and 40 mEq oral potassium  - Repeat chem panel in am   11. History of CVA  - Pt denies any new deficit  - Continue ASA, Brilinta, and statin    DVT prophylaxis: SCD's  Code Status: Full  Family Communication: Discussed with patient  Consults called: None Admission status: Inpatient     Vianne Bulls, MD [facility] Pager [phone]  If 7PM-7AM, please contact night-coverage [email or web address] Password [facility]  10/25/2017, 8:44 PM

## 2017-10-25 NOTE — ED Notes (Addendum)
Pt leaving AMA. Pt signed form and verbalized understanding of condition and risks for leaving. Pt left with discharge instructions and refused discharge prescription for potassium.

## 2017-10-25 NOTE — Discharge Instructions (Addendum)
Your chest x-ray and your CT scan of the chest suggest a mass in your lung.  The CT scan also suggest an infection in your kidney area call pyelonephritis.  Your potassium is low at 2.9.  You have elected to sign yourself out of the hospital emergency department [place].  Please return if you change your mind.  These are potentially dangerous situations.  Please use caution getting around.  If you choose not to return to the emergency room, please see your primary physician as soon as possible.  You are welcome to return to the emergency department concerning your medical issues.

## 2017-10-26 DIAGNOSIS — Z8673 Personal history of transient ischemic attack (TIA), and cerebral infarction without residual deficits: Secondary | ICD-10-CM

## 2017-10-26 DIAGNOSIS — E119 Type 2 diabetes mellitus without complications: Secondary | ICD-10-CM

## 2017-10-26 DIAGNOSIS — M79622 Pain in left upper arm: Secondary | ICD-10-CM

## 2017-10-26 DIAGNOSIS — Z9013 Acquired absence of bilateral breasts and nipples: Secondary | ICD-10-CM

## 2017-10-26 DIAGNOSIS — N289 Disorder of kidney and ureter, unspecified: Secondary | ICD-10-CM

## 2017-10-26 DIAGNOSIS — Z853 Personal history of malignant neoplasm of breast: Secondary | ICD-10-CM

## 2017-10-26 DIAGNOSIS — Z87891 Personal history of nicotine dependence: Secondary | ICD-10-CM

## 2017-10-26 DIAGNOSIS — R918 Other nonspecific abnormal finding of lung field: Secondary | ICD-10-CM

## 2017-10-26 DIAGNOSIS — J45909 Unspecified asthma, uncomplicated: Secondary | ICD-10-CM

## 2017-10-26 DIAGNOSIS — I10 Essential (primary) hypertension: Secondary | ICD-10-CM

## 2017-10-26 DIAGNOSIS — R599 Enlarged lymph nodes, unspecified: Secondary | ICD-10-CM

## 2017-10-26 DIAGNOSIS — Z9221 Personal history of antineoplastic chemotherapy: Secondary | ICD-10-CM

## 2017-10-26 DIAGNOSIS — Z923 Personal history of irradiation: Secondary | ICD-10-CM

## 2017-10-26 DIAGNOSIS — J449 Chronic obstructive pulmonary disease, unspecified: Secondary | ICD-10-CM

## 2017-10-26 DIAGNOSIS — Z809 Family history of malignant neoplasm, unspecified: Secondary | ICD-10-CM

## 2017-10-26 DIAGNOSIS — N12 Tubulo-interstitial nephritis, not specified as acute or chronic: Secondary | ICD-10-CM

## 2017-10-26 LAB — CBC WITH DIFFERENTIAL/PLATELET
Basophils Absolute: 0 10*3/uL (ref 0.0–0.1)
Basophils Relative: 0 %
EOS PCT: 2 %
Eosinophils Absolute: 0.2 10*3/uL (ref 0.0–0.7)
HEMATOCRIT: 25.5 % — AB (ref 36.0–46.0)
Hemoglobin: 7.4 g/dL — ABNORMAL LOW (ref 12.0–15.0)
LYMPHS ABS: 1.3 10*3/uL (ref 0.7–4.0)
LYMPHS PCT: 13 %
MCH: 23.2 pg — AB (ref 26.0–34.0)
MCHC: 29 g/dL — AB (ref 30.0–36.0)
MCV: 79.9 fL (ref 78.0–100.0)
MONO ABS: 0.7 10*3/uL (ref 0.1–1.0)
MONOS PCT: 7 %
NEUTROS ABS: 7.7 10*3/uL (ref 1.7–7.7)
Neutrophils Relative %: 78 %
Platelets: 561 10*3/uL — ABNORMAL HIGH (ref 150–400)
RBC: 3.19 MIL/uL — ABNORMAL LOW (ref 3.87–5.11)
RDW: 20.3 % — AB (ref 11.5–15.5)
WBC: 10 10*3/uL (ref 4.0–10.5)

## 2017-10-26 LAB — FOLATE: Folate: 5.5 ng/mL — ABNORMAL LOW (ref >5.9)

## 2017-10-26 LAB — GLUCOSE, CAPILLARY
GLUCOSE-CAPILLARY: 125 mg/dL — AB (ref 70–99)
GLUCOSE-CAPILLARY: 101 mg/dL — AB (ref 70–99)
Glucose-Capillary: 133 mg/dL — ABNORMAL HIGH (ref 70–99)
Glucose-Capillary: 131 mg/dL — ABNORMAL HIGH (ref 70–99)
Glucose-Capillary: 130 mg/dL — ABNORMAL HIGH (ref 70–99)

## 2017-10-26 LAB — RETICULOCYTES
RBC.: 3.19 MIL/uL — ABNORMAL LOW (ref 3.87–5.11)
RETIC COUNT ABSOLUTE: 67 10*3/uL (ref 19.0–186.0)
Retic Ct Pct: 2.1 % (ref 0.4–3.1)

## 2017-10-26 LAB — BASIC METABOLIC PANEL
ANION GAP: 8 (ref 5–15)
BUN: 13 mg/dL (ref 6–20)
CO2: 28 mmol/L (ref 22–32)
Calcium: 8.6 mg/dL — ABNORMAL LOW (ref 8.9–10.3)
Chloride: 101 mmol/L (ref 98–111)
Creatinine, Ser: 1.36 mg/dL — ABNORMAL HIGH (ref 0.44–1.00)
GFR calc Af Amer: 49 mL/min — ABNORMAL LOW (ref >60)
GFR calc non Af Amer: 43 mL/min — ABNORMAL LOW (ref >60)
GLUCOSE: 123 mg/dL — AB (ref 70–99)
POTASSIUM: 3.9 mmol/L (ref 3.5–5.1)
Sodium: 137 mmol/L (ref 135–145)

## 2017-10-26 LAB — VITAMIN B12: Vitamin B-12: 317 pg/mL (ref 180–914)

## 2017-10-26 LAB — TYPE AND SCREEN
ABO/RH(D): O POS
Antibody Screen: NEGATIVE

## 2017-10-26 LAB — IRON AND TIBC
IRON: 15 ug/dL — AB (ref 28–170)
Saturation Ratios: 7 % — ABNORMAL LOW (ref 10.4–31.8)
TIBC: 212 ug/dL — ABNORMAL LOW (ref 250–450)
UIBC: 197 ug/dL

## 2017-10-26 LAB — MAGNESIUM: Magnesium: 1.9 mg/dL (ref 1.7–2.4)

## 2017-10-26 LAB — FERRITIN: Ferritin: 292 ng/mL (ref 11–307)

## 2017-10-26 MED ORDER — ASPIRIN 81 MG PO CHEW
81.0000 mg | CHEWABLE_TABLET | Freq: Every day | ORAL | Status: DC
  Administered 2017-10-26 – 2017-10-27 (×2): 81 mg via ORAL
  Filled 2017-10-26 (×2): qty 1

## 2017-10-26 MED ORDER — SODIUM CHLORIDE 0.9 % IV SOLN
INTRAVENOUS | Status: DC
  Administered 2017-10-26 – 2017-10-27 (×3): via INTRAVENOUS

## 2017-10-26 NOTE — Progress Notes (Signed)
PROGRESS NOTE    [name]  KGM:[id number] DOB: 1960-12-12 DOA: 10/25/2017 PCP: Javier Docker, MD     Brief Narrative:  57 year old woman admitted from home on 9/4 due to productive cough, malaise, fatigue and chills.  She has a remote history of breast cancer states she completed treatment over 10 years ago, COPD, type 2 diabetes, history of CVA in 2018.  In the ED she was found to be febrile to 38.1 C, hypoxemic, tachycardic and slightly hypotensive with a blood pressure of 92 systolic.  CT scan of the chest showed a centrally necrotic left lower lobe mass suspicious for primary bronchogenic neoplasm and an associated left infrahilar necrotic node concerning for nodal metastases.  CT of the abdomen and pelvis was subsequently performed that showed multiple hypoenhancing left renal lesions concerning for pyelonephritis with possibly early abscess formation.  Blood cultures were collected she was placed on Levaquin and admission was requested.   Assessment & Plan:   Principal Problem:   Pyelonephritis Active Problems:   Anxiety state   Essential hypertension   H/O Stage III Breast cancer, left   COPD (chronic obstructive pulmonary disease) (HCC)   History of CVA (cerebrovascular accident)   Chronic pain syndrome   Diabetes mellitus type 2 in obese (HCC)   Renal insufficiency   Mass of lower lobe of left lung   Normocytic anemia   Hypokalemia   New lung mass/renal masses -Certainly concerning for malignancy especially given her long-standing tobacco abuse (she quit 9 months ago after her CVA but prior to that had been a 2 pack a day smoker for over 30 years). -The lesions in her kidney are also concerning for metastatic spread, although this would be unlikely for lung cancer, however doubt pyelonephritis with a completely negative urine analysis. -Will request formal oncology consultation for further recommendations, suspect will need biopsy.  Not totally clear that  this needs to happen as an inpatient.  Sepsis presumably due to to pyelonephritis -Patient certainly met SIRS criteria on admission, although I am not clear that this is truly due to an infectious source. -Despite findings on CT scan that are consistent with pyelonephritis/early abscess formation, a completely negative urine analysis would make pyelonephritis very unlikely.  Patient has not been on antibiotics recently. -Agree with continuing meropenem for now, at least until blood cultures return, given severity of illness on presentation. -Systolic blood pressure remains in the 90s to 100s.  Start IV fluids at 100 cc an hour as patient does appear clinically dehydrated.  Acute renal failure -Creatinine on presentation was 1.6, down to 1.3 today after fluids. -Plan to continue IV fluids today.  Type 2 diabetes -Well-controlled, continue current regimen.  Remote history of breast cancer -Status post bilateral mastectomy and chemotherapy -Has not had regular follow-ups in a while.   DVT prophylaxis: SCDs Code Status: Full code Family Communication: Multiple close friends at bedside updated on plan of care and all questions answered Disposition Plan: Pending oncology consultation  Consultants:   Oncology, pending  Procedures:   None  Antimicrobials:  Anti-infectives (From admission, onward)   Start     Dose/Rate Route Frequency Ordered Stop   10/25/17 2200  meropenem (MERREM) 1 g in sodium chloride 0.9 % 100 mL IVPB     1 g 200 mL/hr over 30 Minutes Intravenous Every 12 hours 10/25/17 2043     10/25/17 1900  levofloxacin (LEVAQUIN) IVPB 750 mg  Status:  Discontinued     750 mg 100 mL/hr  over 90 Minutes Intravenous  Once 10/25/17 1858 10/25/17 2257       Subjective: Sitting up in bed, has no complaints, states she feels much better than yesterday.  Objective: Vitals:   10/25/17 2000 10/25/17 2116 10/25/17 2217 10/26/17 0536  BP:  (!) 115/54 107/60 (!) 93/57  Pulse:  84 86 90 78  Resp: 19 19 17 17   Temp:   99.1 F (37.3 C) 98.9 F (37.2 C)  TempSrc:   Oral Oral  SpO2: 99% (!) 89% 97% 92%  Weight:   77 kg   Height:   5' 9"  (1.753 m)     Intake/Output Summary (Last 24 hours) at 10/26/2017 1155 Last data filed at 10/26/2017 0907 Gross per 24 hour  Intake 2407.08 ml  Output -  Net 2407.08 ml   Filed Weights   10/25/17 1725 10/25/17 2217  Weight: 86.2 kg 77 kg    Examination:  General exam: Alert, awake, oriented x 3 Respiratory system: Clear to auscultation. Respiratory effort normal. Cardiovascular system:RRR. No murmurs, rubs, gallops. Gastrointestinal system: Abdomen is nondistended, soft and nontender. No organomegaly or masses felt. Normal bowel sounds heard. Central nervous system: Alert and oriented. No focal neurological deficits. Extremities: No C/C/E, +pedal pulses Skin: No rashes, lesions or ulcers Psychiatry: Judgement and insight appear normal. Mood & affect appropriate.     Data Reviewed: I have personally reviewed following labs and imaging studies  CBC: Recent Labs  Lab 10/25/17 1018 10/25/17 1851 10/26/17 0524  WBC 10.1 10.6* 10.0  NEUTROABS 8.0*  --  7.7  HGB 8.6* 8.9* 7.4*  HCT 29.0* 29.2* 25.5*  MCV 79.7 79.1 79.9  PLT 540* 596* 767*   Basic Metabolic Panel: Recent Labs  Lab 10/25/17 1018 10/25/17 1851 10/26/17 0524  NA 136 135 137  K 2.9* 3.4* 3.9  CL 94* 95* 101  CO2 33* 30 28  GLUCOSE 111* 115* 123*  BUN 14 16 13   CREATININE 1.43* 1.63* 1.36*  CALCIUM 9.2 9.1 8.6*  MG  --   --  1.9   GFR: Estimated Creatinine Clearance: 48.3 mL/min (A) (by C-G formula based on SCr of 1.36 mg/dL (H)). Liver Function Tests: No results for input(s): AST, ALT, ALKPHOS, BILITOT, PROT, ALBUMIN in the last 168 hours. No results for input(s): LIPASE, AMYLASE in the last 168 hours. No results for input(s): AMMONIA in the last 168 hours. Coagulation Profile: No results for input(s): INR, PROTIME in the last 168  hours. Cardiac Enzymes: No results for input(s): CKTOTAL, CKMB, CKMBINDEX, TROPONINI in the last 168 hours. BNP (last 3 results) No results for input(s): PROBNP in the last 8760 hours. HbA1C: No results for input(s): HGBA1C in the last 72 hours. CBG: Recent Labs  Lab 10/25/17 2231 10/26/17 0735 10/26/17 1118  GLUCAP 125* 101* 133*   Lipid Profile: No results for input(s): CHOL, HDL, LDLCALC, TRIG, CHOLHDL, LDLDIRECT in the last 72 hours. Thyroid Function Tests: No results for input(s): TSH, T4TOTAL, FREET4, T3FREE, THYROIDAB in the last 72 hours. Anemia Panel: Recent Labs    10/26/17 0524  VITAMINB12 317  FOLATE 5.5*  FERRITIN 292  TIBC 212*  IRON 15*  RETICCTPCT 2.1   Urine analysis:    Component Value Date/Time   COLORURINE STRAW (A) 10/25/2017 2014   APPEARANCEUR CLEAR 10/25/2017 2014   LABSPEC 1.014 10/25/2017 2014   PHURINE 5.0 10/25/2017 2014   GLUCOSEU NEGATIVE 10/25/2017 2014   HGBUR MODERATE (A) 10/25/2017 2014   [place] NEGATIVE 10/25/2017 2014   KETONESUR NEGATIVE  10/25/2017 2014   PROTEINUR NEGATIVE 10/25/2017 2014   UROBILINOGEN 0.2 06/01/2011 1541   NITRITE NEGATIVE 10/25/2017 2014   LEUKOCYTESUR NEGATIVE 10/25/2017 2014   Sepsis Labs: @LABRCNTIP (procalcitonin:4,lacticidven:4)  ) Recent Results (from the past 240 hour(s))  Blood culture (routine x 2)     Status: None (Preliminary result)   Collection Time: 10/25/17  6:59 PM  Result Value Ref Range Status   Specimen Description RIGHT ANTECUBITAL  Final   Special Requests   Final    BOTTLES DRAWN AEROBIC AND ANAEROBIC Blood Culture adequate volume   Culture   Final    NO GROWTH < 12 HOURS Performed at [facility], [address] [place], [place] [address]    Report Status PENDING  Incomplete  Blood culture (routine x 2)     Status: None (Preliminary result)   Collection Time: 10/25/17  8:22 PM  Result Value Ref Range Status   Specimen Description BLOOD RIGHT HAND  Final   Special  Requests   Final    BOTTLES DRAWN AEROBIC AND ANAEROBIC Blood Culture adequate volume   Culture   Final    NO GROWTH < 12 HOURS Performed at [facility], [address], [place], [place] [address]    Report Status PENDING  Incomplete         Radiology Studies: Dg Chest 2 View  Result Date: 10/25/2017 CLINICAL DATA:  Cough, fever EXAM: CHEST - 2 VIEW COMPARISON:  None. FINDINGS: There is a large rounded opacity in the left lower lobe. There is no other focal parenchymal opacity. There is no pleural effusion or pneumothorax. The heart and mediastinal contours are unremarkable. The osseous structures are unremarkable. IMPRESSION: Large rounded opacity in the left lower lobe. This may reflect pneumonia versus a pulmonary mass. Recommend further evaluation with a CT of the chest with intravenous contrast. Electronically Signed   By: Kathreen Devoid   On: 10/25/2017 09:46   Ct Chest W Contrast  Result Date: 10/25/2017 CLINICAL DATA:  Cough, fever, left lower lobe opacity on chest radiograph. Left flank pain, fever. EXAM: CT CHEST, ABDOMEN, AND PELVIS WITH CONTRAST TECHNIQUE: Multidetector CT imaging of the chest, abdomen and pelvis was performed following the standard protocol during bolus administration of intravenous contrast. CONTRAST:  83m OMNIPAQUE IOHEXOL 300 MG/ML  SOLN COMPARISON:  Chest radiographs dated 10/25/2017. CT chest abdomen pelvis dated 10/05/2012. FINDINGS: CT CHEST FINDINGS Cardiovascular: Heart is normal in size.  No pericardial effusion. No evidence of thoracic aortic aneurysm. Atherosclerotic calcifications of the aortic arch. 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 No suspicious mediastinal lymphadenopathy. 14 mm short axis low-density left infrahilar node (series 2/image 78), suspicious. Visualized thyroid is unremarkable. Mediastinum/Nodes: No suspicious mediastinal lymphadenopathy. Necrotic 14 mm short axis left infrahilar node (series  2/image 78), suspicious. Visualized thyroid is unremarkable. Lungs/Pleura: 6.9 x 5.2 x 6.8 cm centrally necrotic left lower lobe mass, abutting the pleura/left chest wall, highly suspicious for primary bronchogenic neoplasm. Mass abuts the left fissure. Mild biapical pleural-parenchymal scarring. No focal consolidation. No pleural effusion or pneumothorax. Musculoskeletal: Mild degenerative changes of the mid thoracic spine. CT ABDOMEN PELVIS FINDINGS Hepatobiliary: Liver is within normal limits, noting focal fat/altered perfusion along the falciform ligament (series 15/image 23). Gallbladder is underdistended. No intrahepatic or extrahepatic ductal dilatation. Pancreas: Within normal limits. Spleen: Cystic lesions in the spleen measuring up to 2.2 cm, likely benign. Adrenals/Urinary Tract: Adrenal glands are within normal limits. Multiple hypoenhancing renal lesions, suggesting pyelonephritis, with possible early  renal abscesses measuring up to 3.5 cm in the left upper kidney (series 15/image 29) and 3.1 cm in the right upper kidney (series 15/image 35). No hydronephrosis. Bladder is within normal limits. Stomach/Bowel: Stomach is within normal limits. No evidence of bowel obstruction. Appendix is not discretely visualized. Vascular/Lymphatic: No evidence of abdominal aortic aneurysm. Atherosclerotic calcifications of the abdominal aorta and branch vessels. No suspicious abdominopelvic lymphadenopathy. Reproductive: Status post hysterectomy. No adnexal masses. Other: No abdominopelvic ascites. Musculoskeletal: Mild degenerative changes at L5-S1. IMPRESSION: 6.9 cm centrally necrotic left lower lobe mass, abutting the pleura/left chest wall and left fissure, suspicious for primary bronchogenic neoplasm. Necrotic 14 mm left infrahilar node, suspicious for nodal metastasis. Multiple hypoenhancing renal lesion, suggesting pyelonephritis, with possible early renal abscesses measuring up to 3.5 cm on the left.  Technically metastases or lymphoma could have a similar appearance, although this is considered unlikely given the clinical symptoms. Follow-up CT or MRI abdomen with/without contrast is suggested in 3-4 weeks after appropriate antimicrobial therapy to ensure resolution. 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 Electronically Signed   By: Julian Hy M.D.   On: 10/25/2017 13:16   Ct Abdomen Pelvis W Contrast  Result Date: 10/25/2017 CLINICAL DATA:  Cough, fever, left lower lobe opacity on chest radiograph. Left flank pain, fever. EXAM: CT CHEST, ABDOMEN, AND PELVIS WITH CONTRAST TECHNIQUE: Multidetector CT imaging of the chest, abdomen and pelvis was performed following the standard protocol during bolus administration of intravenous contrast. CONTRAST:  42m OMNIPAQUE IOHEXOL 300 MG/ML  SOLN COMPARISON:  Chest radiographs dated 10/25/2017. CT chest abdomen pelvis dated 10/05/2012. FINDINGS: CT CHEST FINDINGS Cardiovascular: Heart is normal in size.  No pericardial effusion. No evidence of thoracic aortic aneurysm. Atherosclerotic calcifications of the aortic arch. 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 No suspicious mediastinal lymphadenopathy. 14 mm short axis low-density left infrahilar node (series 2/image 78), suspicious. Visualized thyroid is unremarkable. Mediastinum/Nodes: No suspicious mediastinal lymphadenopathy. Necrotic 14 mm short axis left infrahilar node (series 2/image 78), suspicious. Visualized thyroid is unremarkable. Lungs/Pleura: 6.9 x 5.2 x 6.8 cm centrally necrotic left lower lobe mass, abutting the pleura/left chest wall, highly suspicious for primary bronchogenic neoplasm. Mass abuts the left fissure. Mild biapical pleural-parenchymal scarring. No focal consolidation. No pleural effusion or pneumothorax. Musculoskeletal: Mild degenerative changes of the mid thoracic spine. CT ABDOMEN PELVIS FINDINGS Hepatobiliary: Liver  is within normal limits, noting focal fat/altered perfusion along the falciform ligament (series 15/image 23). Gallbladder is underdistended. No intrahepatic or extrahepatic ductal dilatation. Pancreas: Within normal limits. Spleen: Cystic lesions in the spleen measuring up to 2.2 cm, likely benign. Adrenals/Urinary Tract: Adrenal glands are within normal limits. Multiple hypoenhancing renal lesions, suggesting pyelonephritis, with possible early renal abscesses measuring up to 3.5 cm in the left upper kidney (series 15/image 29) and 3.1 cm in the right upper kidney (series 15/image 35). No hydronephrosis. Bladder is within normal limits. Stomach/Bowel: Stomach is within normal limits. No evidence of bowel obstruction. Appendix is not discretely visualized. Vascular/Lymphatic: No evidence of abdominal aortic aneurysm. Atherosclerotic calcifications of the abdominal aorta and branch vessels. No suspicious abdominopelvic lymphadenopathy. Reproductive: Status post hysterectomy. No adnexal masses. Other: No abdominopelvic ascites. Musculoskeletal: Mild degenerative changes at L5-S1. IMPRESSION: 6.9 cm centrally necrotic left lower lobe mass, abutting the pleura/left chest wall and left fissure, suspicious for primary bronchogenic neoplasm. Necrotic 14 mm left infrahilar node, suspicious for nodal metastasis. Multiple hypoenhancing renal lesion, suggesting pyelonephritis, with possible early renal abscesses  measuring up to 3.5 cm on the left. Technically metastases or lymphoma could have a similar appearance, although this is considered unlikely given the clinical symptoms. Follow-up CT or MRI abdomen with/without contrast is suggested in 3-4 weeks after appropriate antimicrobial therapy to ensure resolution. 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 Electronically Signed   By: Julian Hy M.D.   On: 10/25/2017 13:16        Scheduled Meds: . aspirin  81 mg Oral Daily  . atorvastatin   10 mg Oral q1800  . famotidine  20 mg Oral BID  . gabapentin  800 mg Oral TID  . insulin aspart  0-5 Units Subcutaneous QHS  . insulin aspart  0-9 Units Subcutaneous TID WC  . montelukast  10 mg Oral QHS  . olopatadine  1 drop Both Eyes BID  . ticagrelor  90 mg Oral BID   Continuous Infusions: . meropenem (MERREM) IV Stopped (10/26/17 1113)     LOS: 1 day    Time spent: 35 minutes. Greater than 50% of this time was spent in direct contact with the patient, coordinating care and discussing relevant ongoing clinical issues, including possible diagnosis of malignancy, need for oncology consultation and potentially biopsy.     Lelon Frohlich, MD [facility] Pager [phone]  If 7PM-7AM, please contact night-coverage [email or web address] Password [email or web address] 10/26/2017, 11:55 AM

## 2017-10-26 NOTE — Consult Note (Signed)
[facility] Consultation Oncology  Name: [name]      MRN: [id number]    Location: A313/A313-01  Date: 10/26/2017 Time:4:47 PM   REFERRING PHYSICIAN: Dr. Isaac Bliss  REASON FOR CONSULT: Left lower lobe lung mass with infrahilar lymph node.   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  HISTORY OF PRESENT ILLNESS: She is a 57 year old pleasant white female who is seen in consultation today for further work-up and management of left lower lobe lung mass.  She came to the hospital ER with left upper extremity pain.  She also was found to have a fever.  CT scan of the chest done showed 6.9 cm centrally necrotic left lower lobe mass, abutting pleura/left chest wall and left fissure, suspicious for bronchogenic neoplasm.  There was a necrotic 14 mm left infrahilar lymph node.  She was also found to have multiple hypoenhancing kidney lesions suggesting pyelonephritis.  Patient denies any dysuria.  She did have fever for couple of days but denied any chills.  She is started on antibiotics and was admitted to the hospital. She does report history of left breast cancer.  I could not get the records as they are not available on epic.  She reportedly had left breast cancer, T3 N1 M0, status post left mastectomy in August 2009.  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  She did receive chemotherapy followed by radiation.  I do not know if she had received neoadjuvant chemotherapy. She also reports rash on her mid back region on and off for the past few months.  It occasionally hurts.  She smoked 1 to 2 packs of cigarettes per day starting at age 70 and quit last year when she had stroke.  She also reports change in her baseline cough and is coughing out brownish sputum particularly in the mornings.  Denies any hemoptysis.  Denies any chest pains.  PAST MEDICAL HISTORY:   Past Medical History:  Diagnosis Date  . Asthma   . Back muscle spasm 12/13/2010  . Cancer [facility])    breast  cancer, remission 2010  . Chronic back pain   . Chronic foot pain   . COPD (chronic obstructive pulmonary disease) ([place]) 09/14/2010  . Diabetes mellitus without complication ([place])   . H/O MRSA infection 09/14/2010  . High blood cholesterol   . Hyperlipidemia 12/07/2012  . Hypertension   . Lung mass   . 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 Memory loss   . Psoriasis 12/13/2010  . Stroke ([place])   . Tobacco abuse 12/07/2012    ALLERGIES: Allergies  Allergen Reactions  . Codeine Anaphylaxis and Rash  . Penicillins Anaphylaxis, Rash and Other (See Comments)    Has patient had a PCN reaction causing immediate rash, facial/tongue/throat swelling, SOB or lightheadedness with hypotension: Yes Has patient had a PCN reaction causing severe rash involving mucus membranes or skin necrosis: No Has patient had a PCN reaction that required hospitalization: Yes Has patient had a PCN reaction occurring within the last 10 years: No If all of the above answers are "NO", then may proceed with Cephalosporin use.       MEDICATIONS: I have reviewed the patient's current medications.     PAST SURGICAL HISTORY Past Surgical History:  Procedure Laterality Date  . ABDOMINAL HYSTERECTOMY    . CESAREAN SECTION     x7  . IR ANGIO INTRA EXTRACRAN SEL COM CAROTID INNOMINATE BILAT MOD SED  10/10/2016  . IR ANGIO VERTEBRAL SEL SUBCLAVIAN INNOMINATE UNI  R MOD SED  10/10/2016  . IR ANGIOGRAM EXTREMITY LEFT  10/10/2016  . MASTECTOMY     left  . MASTECTOMY Right 2009   prophylactic  . PORT-A-CATH REMOVAL  06/01/2011   Procedure: REMOVAL PORT-A-CATH;  Surgeon: Donato Heinz, MD;  Location: [facility];  Service: General;  Laterality: N/A;  Minor Room  . portacath insertion    . RADIOLOGY WITH ANESTHESIA N/A 01/23/2017   Procedure: ANGIOPLASTY WITH STENTING;  Surgeon: Luanne Bras, MD;  Location: [facility];  Service: Radiology;  Laterality: N/A;    FAMILY HISTORY: Family History  Problem Relation Age of Onset   . Stroke Mother   . Stroke Father   . Cancer Maternal Aunt   . Cancer Maternal Grandmother   . Cancer Maternal Aunt   . Cancer Maternal Aunt     SOCIAL HISTORY:  reports that she has been smoking. She has a 9.00 pack-year smoking history. She has never used smokeless tobacco. She reports that she drank alcohol. She reports that she has current or past drug history. Drug: Marijuana.  PERFORMANCE STATUS: The patient's performance status is 1 - Symptomatic but completely ambulatory  PHYSICAL EXAM: Most Recent Vital Signs: Blood pressure 110/81, pulse 84, temperature 99.3 F (37.4 C), temperature source Oral, resp. rate 18, height _0  (1.753 m), weight 169 lb 12.8 oz (77 kg), SpO2 95 %. BP 110/81   Pulse 84   Temp 99.3 F (37.4 C) (Oral)   Resp 18   Ht _1  (1.753 m)   Wt 169 lb 12.8 oz (77 kg)   SpO2 95%   BMI 25.08 kg/m  Head: Normocephalic, without obvious abnormality, atraumatic Neck: no adenopathy, supple, symmetrical, trachea midline and thyroid not enlarged, symmetric, no tenderness/mass/nodules Lungs: clear to auscultation bilaterally Breasts: normal appearance, no masses or tenderness, Bilateral mastectomy sites are within normal limits.  No masses palpable. Heart: regular rate and rhythm Abdomen: soft, non-tender; bowel sounds normal; no masses,  no organomegaly Extremities: no edema, redness or tenderness in the calves or thighs Skin: There is a erythematous maculopapular rash in the mid back region, appears old with scaling. Lymph nodes: Cervical, supraclavicular, and axillary nodes normal.  LABORATORY DATA:  Results for orders placed or performed during the hospital encounter of 10/25/17 (from the past 48 hour(s))  Basic metabolic panel     Status: Abnormal   Collection Time: 10/25/17  6:51 PM  Result Value Ref Range   Sodium 135 135 - 145 mmol/L   Potassium 3.4 (L) 3.5 - 5.1 mmol/L   Chloride 95 (L) 98 - 111 mmol/L   CO2 30 22 - 32 mmol/L   Glucose, Bld  115 (H) 70 - 99 mg/dL   BUN 16 6 - 20 mg/dL   Creatinine, Ser 1.63 (H) 0.44 - 1.00 mg/dL   Calcium 9.1 8.9 - 10.3 mg/dL   GFR calc non Af Amer 34 (L) >60 mL/min   GFR calc Af Amer 40 (L) >60 mL/min    Comment: (NOTE) The eGFR has been calculated using the CKD EPI equation. This calculation has not been validated in all clinical situations. eGFR's persistently <60 mL/min signify possible Chronic Kidney Disease.    Anion gap 10 5 - 15    Comment: Performed at [facility], [address], [place], [place] [address]  CBC     Status: Abnormal   Collection Time: 10/25/17  6:51 PM  Result Value Ref Range   WBC 10.6 (H) 4.0 - 10.5 K/uL  RBC 3.69 (L) 3.87 - 5.11 MIL/uL   Hemoglobin 8.9 (L) 12.0 - 15.0 g/dL   HCT 29.2 (L) 36.0 - 46.0 %   MCV 79.1 78.0 - 100.0 fL   MCH 24.1 (L) 26.0 - 34.0 pg   MCHC 30.5 30.0 - 36.0 g/dL   RDW 20.3 (H) 11.5 - 15.5 %   Platelets 596 (H) 150 - 400 K/uL    Comment: Performed at [facility], [address], [place], [place] [address]  Blood culture (routine x 2)     Status: None (Preliminary result)   Collection Time: 10/25/17  6:59 PM  Result Value Ref Range   Specimen Description RIGHT ANTECUBITAL    Special Requests      BOTTLES DRAWN AEROBIC AND ANAEROBIC Blood Culture adequate volume   Culture      NO GROWTH < 12 HOURS Performed at [facility], [address], [place], [place] [address]    Report Status PENDING   POC occult blood, ED RN will collect     Status: None   Collection Time: 10/25/17  7:25 PM  Result Value Ref Range   Fecal Occult Bld NEGATIVE NEGATIVE  Urinalysis, Routine w reflex microscopic     Status: Abnormal   Collection Time: 10/25/17  8:14 PM  Result Value Ref Range   Color, Urine STRAW (A) YELLOW   APPearance CLEAR CLEAR   Specific Gravity, Urine 1.014 1.005 - 1.030   pH 5.0 5.0 - 8.0   Glucose, UA NEGATIVE NEGATIVE mg/dL   Hgb urine dipstick MODERATE (A) NEGATIVE   Bilirubin Urine NEGATIVE NEGATIVE   Ketones, ur NEGATIVE  NEGATIVE mg/dL   Protein, ur NEGATIVE NEGATIVE mg/dL   Nitrite NEGATIVE NEGATIVE   Leukocytes, UA NEGATIVE NEGATIVE   RBC / HPF 0-5 0 - 5 RBC/hpf   WBC, UA 0-5 0 - 5 WBC/hpf   Bacteria, UA RARE (A) NONE SEEN   Squamous Epithelial / LPF 0-5 0 - 5    Comment: Performed at [facility], [address] [place], [place] [address]  Sodium, urine, random     Status: None   Collection Time: 10/25/17  8:21 PM  Result Value Ref Range   Sodium, Ur 30 mmol/L    Comment: Performed at [facility], [address], [place], [place] [address]  Creatinine, urine, random     Status: None   Collection Time: 10/25/17  8:21 PM  Result Value Ref Range   Creatinine, Urine 46.50 mg/dL    Comment: Performed at [facility], [address] [place], [place] [address]  Blood culture (routine x 2)     Status: None (Preliminary result)   Collection Time: 10/25/17  8:22 PM  Result Value Ref Range   Specimen Description BLOOD RIGHT HAND    Special Requests      BOTTLES DRAWN AEROBIC AND ANAEROBIC Blood Culture adequate volume   Culture      NO GROWTH < 12 HOURS Performed at [facility], [address], [place], [place] [address]    Report Status PENDING   Type and screen [facility]     Status: None   Collection Time: 10/25/17  8:22 PM  Result Value Ref Range   ABO/RH(D) O POS    Antibody Screen NEG    Sample Expiration      10/28/2017 Performed at [facility], [address] [place],  [address]   Glucose, capillary     Status: Abnormal   Collection Time: 10/25/17 10:31 PM  Result Value Ref Range   Glucose-Capillary 125 (H) 70 - 99 mg/dL  Basic metabolic panel     Status: Abnormal   Collection Time: 10/26/17  5:24 AM  Result Value Ref Range   Sodium 137 135 - 145 mmol/L   Potassium 3.9 3.5 - 5.1 mmol/L   Chloride 101 98 - 111 mmol/L   CO2 28 22 - 32 mmol/L   Glucose, Bld 123 (H) 70 - 99 mg/dL   BUN 13 6 - 20 mg/dL   Creatinine, Ser 1.36 (H) 0.44 - 1.00 mg/dL   Calcium 8.6 (L) 8.9  - 10.3 mg/dL   GFR calc non Af Amer 43 (L) >60 mL/min   GFR calc Af Amer 49 (L) >60 mL/min    Comment: (NOTE) The eGFR has been calculated using the CKD EPI equation. This calculation has not been validated in all clinical situations. eGFR's persistently <60 mL/min signify possible Chronic Kidney Disease.    Anion gap 8 5 - 15    Comment: Performed at [facility], [address] [place], [place] [address]  CBC WITH DIFFERENTIAL     Status: Abnormal   Collection Time: 10/26/17  5:24 AM  Result Value Ref Range   WBC 10.0 4.0 - 10.5 K/uL   RBC 3.19 (L) 3.87 - 5.11 MIL/uL   Hemoglobin 7.4 (L) 12.0 - 15.0 g/dL   HCT 25.5 (L) 36.0 - 46.0 %   MCV 79.9 78.0 - 100.0 fL   MCH 23.2 (L) 26.0 - 34.0 pg   MCHC 29.0 (L) 30.0 - 36.0 g/dL   RDW 20.3 (H) 11.5 - 15.5 %   Platelets 561 (H) 150 - 400 K/uL   Neutrophils Relative % 78 %   Neutro Abs 7.7 1.7 - 7.7 K/uL   Lymphocytes Relative 13 %   Lymphs Abs 1.3 0.7 - 4.0 K/uL   Monocytes Relative 7 %   Monocytes Absolute 0.7 0.1 - 1.0 K/uL   Eosinophils Relative 2 %   Eosinophils Absolute 0.2 0.0 - 0.7 K/uL   Basophils Relative 0 %   Basophils Absolute 0.0 0.0 - 0.1 K/uL    Comment: Performed at [facility], [address] [place], [place] [address]  Vitamin B12     Status: None   Collection Time: 10/26/17  5:24 AM  Result Value Ref Range   Vitamin B-12 317 180 - 914 pg/mL    Comment: (NOTE) This assay is not validated for testing neonatal or myeloproliferative syndrome specimens for Vitamin B12 levels. Performed at [facility], [address] [place], [place] [address]   Folate     Status: Abnormal   Collection Time: 10/26/17  5:24 AM  Result Value Ref Range   Folate 5.5 (L) >5.9 ng/mL    Comment: Performed at [facility], [address] [place], [place] [address]  Iron and TIBC     Status: Abnormal   Collection Time: 10/26/17  5:24 AM  Result Value Ref Range   Iron 15 (L) 28 - 170 ug/dL   TIBC 212 (L) 250 - 450 ug/dL   Saturation  Ratios 7 (L) 10.4 - 31.8 %   UIBC 197 ug/dL    Comment: Performed at [facility], [address], [place],  [address]  Ferritin     Status: None   Collection Time: 10/26/17  5:24 AM  Result Value Ref Range   Ferritin 292 11 - 307 ng/mL    Comment: Performed at [facility], [address], [place], [place]  [address]  Reticulocytes     Status: Abnormal   Collection Time: 10/26/17  5:24 AM  Result Value Ref Range   Retic Ct Pct 2.1 0.4 - 3.1 %   RBC. 3.19 (L) 3.87 - 5.11 MIL/uL   Retic Count, Absolute 67.0 19.0 - 186.0 K/uL    Comment: Performed at [facility], [address], [place], [place] [address]  Magnesium     Status: None   Collection Time: 10/26/17  5:24 AM  Result Value Ref Range   Magnesium 1.9 1.7 - 2.4 mg/dL    Comment: Performed at [facility], [address], [place], [place] [address]  Glucose, capillary     Status: Abnormal   Collection Time: 10/26/17  7:35 AM  Result Value Ref Range   Glucose-Capillary 101 (H) 70 - 99 mg/dL  Glucose, capillary     Status: Abnormal   Collection Time: 10/26/17 11:18 AM  Result Value Ref Range   Glucose-Capillary 133 (H) 70 - 99 mg/dL  Glucose, capillary     Status: Abnormal   Collection Time: 10/26/17  4:12 PM  Result Value Ref Range   Glucose-Capillary 131 (H) 70 - 99 mg/dL      RADIOGRAPHY: Dg Chest 2 View  Result Date: 10/25/2017 CLINICAL DATA:  Cough, fever EXAM: CHEST - 2 VIEW COMPARISON:  None. FINDINGS: There is a large rounded opacity in the left lower lobe. There is no other focal parenchymal opacity. There is no pleural effusion or pneumothorax. The heart and mediastinal contours are unremarkable. The osseous structures are unremarkable. IMPRESSION: Large rounded opacity in the left lower lobe. This may reflect pneumonia versus a pulmonary mass. Recommend further evaluation with a CT of the chest with intravenous contrast. Electronically Signed   By: Kathreen Devoid   On: 10/25/2017 09:46   Ct Chest W  Contrast  Result Date: 10/25/2017 CLINICAL DATA:  Cough, fever, left lower lobe opacity on chest radiograph. Left flank pain, fever. EXAM: CT CHEST, ABDOMEN, AND PELVIS WITH CONTRAST TECHNIQUE: Multidetector CT imaging of the chest, abdomen and pelvis was performed following the standard protocol during bolus administration of intravenous contrast. CONTRAST:  15m OMNIPAQUE IOHEXOL 300 MG/ML  SOLN COMPARISON:  Chest radiographs dated 10/25/2017. CT chest abdomen pelvis dated 10/05/2012. FINDINGS: CT CHEST FINDINGS Cardiovascular: Heart is normal in size.  No pericardial effusion. No evidence of thoracic aortic aneurysm. Atherosclerotic calcifications of the aortic arch. 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 No suspicious mediastinal lymphadenopathy. 14 mm short axis low-density left infrahilar node (series 2/image 78), suspicious. Visualized thyroid is unremarkable. Mediastinum/Nodes: No suspicious mediastinal lymphadenopathy. Necrotic 14 mm short axis left infrahilar node (series 2/image 78), suspicious. Visualized thyroid is unremarkable. Lungs/Pleura: 6.9 x 5.2 x 6.8 cm centrally necrotic left lower lobe mass, abutting the pleura/left chest wall, highly suspicious for primary bronchogenic neoplasm. Mass abuts the left fissure. Mild biapical pleural-parenchymal scarring. No focal consolidation. No pleural effusion or pneumothorax. Musculoskeletal: Mild degenerative changes of the mid thoracic spine. CT ABDOMEN PELVIS FINDINGS Hepatobiliary: Liver is within normal limits, noting focal fat/altered perfusion along the falciform ligament (series 15/image 23). Gallbladder is underdistended. No intrahepatic or extrahepatic ductal dilatation. Pancreas: Within normal limits. Spleen: Cystic lesions in the spleen measuring up to 2.2 cm, likely benign. Adrenals/Urinary Tract: Adrenal glands are within normal limits. Multiple hypoenhancing renal lesions, suggesting pyelonephritis, with  possible early renal abscesses measuring up to 3.5 cm in the left upper kidney (series 15/image 29) and 3.1 cm in  the right upper kidney (series 15/image 35). No hydronephrosis. Bladder is within normal limits. Stomach/Bowel: Stomach is within normal limits. No evidence of bowel obstruction. Appendix is not discretely visualized. Vascular/Lymphatic: No evidence of abdominal aortic aneurysm. Atherosclerotic calcifications of the abdominal aorta and branch vessels. No suspicious abdominopelvic lymphadenopathy. Reproductive: Status post hysterectomy. No adnexal masses. Other: No abdominopelvic ascites. Musculoskeletal: Mild degenerative changes at L5-S1. IMPRESSION: 6.9 cm centrally necrotic left lower lobe mass, abutting the pleura/left chest wall and left fissure, suspicious for primary bronchogenic neoplasm. Necrotic 14 mm left infrahilar node, suspicious for nodal metastasis. Multiple hypoenhancing renal lesion, suggesting pyelonephritis, with possible early renal abscesses measuring up to 3.5 cm on the left. Technically metastases or lymphoma could have a similar appearance, although this is considered unlikely given the clinical symptoms. Follow-up CT or MRI abdomen with/without contrast is suggested in 3-4 weeks after appropriate antimicrobial therapy to ensure resolution. 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 Electronically Signed   By: Julian Hy M.D.   On: 10/25/2017 13:16   Ct Abdomen Pelvis W Contrast  Result Date: 10/25/2017 CLINICAL DATA:  Cough, fever, left lower lobe opacity on chest radiograph. Left flank pain, fever. EXAM: CT CHEST, ABDOMEN, AND PELVIS WITH CONTRAST TECHNIQUE: Multidetector CT imaging of the chest, abdomen and pelvis was performed following the standard protocol during bolus administration of intravenous contrast. CONTRAST:  40m OMNIPAQUE IOHEXOL 300 MG/ML  SOLN COMPARISON:  Chest radiographs dated 10/25/2017. CT chest abdomen pelvis dated  10/05/2012. FINDINGS: CT CHEST FINDINGS Cardiovascular: Heart is normal in size.  No pericardial effusion. No evidence of thoracic aortic aneurysm. Atherosclerotic calcifications of the aortic arch. 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 No suspicious mediastinal lymphadenopathy. 14 mm short axis low-density left infrahilar node (series 2/image 78), suspicious. Visualized thyroid is unremarkable. Mediastinum/Nodes: No suspicious mediastinal lymphadenopathy. Necrotic 14 mm short axis left infrahilar node (series 2/image 78), suspicious. Visualized thyroid is unremarkable. Lungs/Pleura: 6.9 x 5.2 x 6.8 cm centrally necrotic left lower lobe mass, abutting the pleura/left chest wall, highly suspicious for primary bronchogenic neoplasm. Mass abuts the left fissure. Mild biapical pleural-parenchymal scarring. No focal consolidation. No pleural effusion or pneumothorax. Musculoskeletal: Mild degenerative changes of the mid thoracic spine. CT ABDOMEN PELVIS FINDINGS Hepatobiliary: Liver is within normal limits, noting focal fat/altered perfusion along the falciform ligament (series 15/image 23). Gallbladder is underdistended. No intrahepatic or extrahepatic ductal dilatation. Pancreas: Within normal limits. Spleen: Cystic lesions in the spleen measuring up to 2.2 cm, likely benign. Adrenals/Urinary Tract: Adrenal glands are within normal limits. Multiple hypoenhancing renal lesions, suggesting pyelonephritis, with possible early renal abscesses measuring up to 3.5 cm in the left upper kidney (series 15/image 29) and 3.1 cm in the right upper kidney (series 15/image 35). No hydronephrosis. Bladder is within normal limits. Stomach/Bowel: Stomach is within normal limits. No evidence of bowel obstruction. Appendix is not discretely visualized. Vascular/Lymphatic: No evidence of abdominal aortic aneurysm. Atherosclerotic calcifications of the abdominal aorta and branch vessels. No  suspicious abdominopelvic lymphadenopathy. Reproductive: Status post hysterectomy. No adnexal masses. Other: No abdominopelvic ascites. Musculoskeletal: Mild degenerative changes at L5-S1. IMPRESSION: 6.9 cm centrally necrotic left lower lobe mass, abutting the pleura/left chest wall and left fissure, suspicious for primary bronchogenic neoplasm. Necrotic 14 mm left infrahilar node, suspicious for nodal metastasis. Multiple hypoenhancing renal lesion, suggesting pyelonephritis, with possible early renal abscesses measuring up to 3.5 cm on the left. Technically metastases or lymphoma could have a similar appearance, although this  is considered unlikely given the clinical symptoms. Follow-up CT or MRI abdomen with/without contrast is suggested in 3-4 weeks after appropriate antimicrobial therapy to ensure resolution. 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 Electronically Signed   By: Julian Hy M.D.   On: 10/25/2017 13:16        ASSESSMENT and plan:  1. Left lower lobe lung mass: -CT scan on 10/25/2017 shows 6.9 cm centrally necrotic left lower lobe lung mass, abutting pleura/left chest wall and left fissure, suspicious for primary bronchogenic neoplasm.  There is also a 14 mm left infrahilar lymph node which is necrotic suspicious for nodal metastasis. -Patient has smoked 1 to 2 packs of cigarettes per day starting at age 62 and quit in 2018 when she had a CVA. -These findings are highly suspicious for lung cancer.  We will recommend CT-guided biopsy of the left lower lobe lung mass by interventional radiology. -We will see her back after the biopsy to discuss further plan of action.  Her left upper extremity pain is likely from lymphedema.  2.  Hypoenhancing kidney lesions: - Multiple hypodense kidney lesions were suggestive of pyelonephritis and possible early renal abscess measuring up to 3.5 cm on the left per radiology report. -Patient does not have any clinical  symptoms of pyelonephritis.  Her UA was also clean.  She does not have any dysuria. -I have discussed this with Dr. Jerilee Hoh.  We will give a trial of 2 weeks of antibiotics.  We will plan to repeat the CT scan without contrast in 3 to 4 weeks.  All questions were answered. The patient knows to call the clinic with any problems, questions or concerns. We can certainly see the patient much sooner if necessary.   Derek Jack

## 2017-10-27 ENCOUNTER — Telehealth: Payer: Self-pay | Admitting: Interventional Radiology

## 2017-10-27 LAB — BASIC METABOLIC PANEL
ANION GAP: 5 (ref 5–15)
BUN: 12 mg/dL (ref 6–20)
CALCIUM: 8.4 mg/dL — AB (ref 8.9–10.3)
CO2: 29 mmol/L (ref 22–32)
CREATININE: 1.21 mg/dL — AB (ref 0.44–1.00)
Chloride: 105 mmol/L (ref 98–111)
GFR calc Af Amer: 57 mL/min — ABNORMAL LOW (ref >60)
GFR, EST NON AFRICAN AMERICAN: 49 mL/min — AB (ref >60)
GLUCOSE: 111 mg/dL — AB (ref 70–99)
Potassium: 4.3 mmol/L (ref 3.5–5.1)
Sodium: 139 mmol/L (ref 135–145)

## 2017-10-27 LAB — EXPECTORATED SPUTUM ASSESSMENT W REFEX TO RESP CULTURE

## 2017-10-27 LAB — CBC
HCT: 24.9 % — ABNORMAL LOW (ref 36.0–46.0)
Hemoglobin: 7.2 g/dL — ABNORMAL LOW (ref 12.0–15.0)
MCH: 23.6 pg — ABNORMAL LOW (ref 26.0–34.0)
MCHC: 28.9 g/dL — AB (ref 30.0–36.0)
MCV: 81.6 fL (ref 78.0–100.0)
PLATELETS: 513 10*3/uL — AB (ref 150–400)
RBC: 3.05 MIL/uL — ABNORMAL LOW (ref 3.87–5.11)
RDW: 20.6 % — AB (ref 11.5–15.5)
WBC: 10 10*3/uL (ref 4.0–10.5)

## 2017-10-27 LAB — UREA NITROGEN, URINE: Urea Nitrogen, Ur: 177 mg/dL

## 2017-10-27 LAB — URINE CULTURE
Culture: NO GROWTH
Culture: NO GROWTH

## 2017-10-27 LAB — GLUCOSE, CAPILLARY
Glucose-Capillary: 160 mg/dL — ABNORMAL HIGH (ref 70–99)
Glucose-Capillary: 91 mg/dL (ref 70–99)

## 2017-10-27 LAB — HIV ANTIBODY (ROUTINE TESTING W REFLEX): HIV Screen 4th Generation wRfx: NONREACTIVE

## 2017-10-27 LAB — EXPECTORATED SPUTUM ASSESSMENT W GRAM STAIN, RFLX TO RESP C

## 2017-10-27 MED ORDER — LEVOFLOXACIN IN D5W 500 MG/100ML IV SOLN
500.0000 mg | INTRAVENOUS | Status: DC
  Administered 2017-10-27: 500 mg via INTRAVENOUS
  Filled 2017-10-27: qty 100

## 2017-10-27 MED ORDER — OXYCODONE HCL 15 MG PO TABS: 15.0000 mg | ORAL_TABLET | ORAL | 0 refills | Status: DC | PRN

## 2017-10-27 MED ORDER — LEVOFLOXACIN 500 MG PO TABS: 500.0000 mg | ORAL_TABLET | Freq: Every day | ORAL | 0 refills | Status: AC

## 2017-10-27 NOTE — Discharge Summary (Signed)
Physician Discharge Summary  [name] PQA:[id number] DOB: 04/20/1960 DOA: 10/25/2017  PCP: Javier Docker, MD  Admit date: 10/25/2017 Discharge date: 10/27/2017  Time spent: 45 minutes  Recommendations for Outpatient Follow-up:  -Will be discharged home today. -After discussions with IR Dr. Vernard Gambles, plans have been made for patient to have a PET scan on 9/13, after that CT-guided biopsy will be arranged for further diagnosis of her large left lung mass.  She has been seen in consultation as an inpatient by oncology who will plan to follow-up as an outpatient to determine treatment plan. -In regards to her kidney lesions, plan is to complete 2-week course of antibiotics for presumed pyelonephritis with repeat imaging afterwards.  If lesions are still present, high concern for either metastatic disease versus another primary. -Have advised close follow-up with PCP and oncology.  Patient and sister are aware.  Discharge Diagnoses:  Principal Problem:   Pyelonephritis Active Problems:   Anxiety state   Essential hypertension   H/O Stage III Breast cancer, left   COPD (chronic obstructive pulmonary disease) (HCC)   History of CVA (cerebrovascular accident)   Chronic pain syndrome   Diabetes mellitus type 2 in obese [facility])   Renal insufficiency   Mass of lower lobe of left lung   Normocytic anemia   Hypokalemia   Discharge Condition: Stable and improved  Filed Weights   10/25/17 1725 10/25/17 2217  Weight: 86.2 kg 77 kg    History of present illness:  As per Dr. Myna Hidalgo on 9/4: [name] is a 57 y.o. female with medical history significant for history of breast cancer status post mastectomies and chemotherapy, COPD, type 2 diabetes mellitus, chronic pain, anxiety, hypertension, and history of CVA, now presenting to the emergency department for evaluation of productive cough, malaise, fatigue, and chills.  Patient reports that she developed a nonspecific  malaise and fatigue insidiously that is worsened over the past week.  She has also developed a cough productive of dark, blackish sputum over the same interval.  She reports mild shortness of breath.  Reports chronic unchanged swelling in the lower extremities.  Denies chest pain or palpitations.  Reports chills for the past couple days.  Denies any significant abdominal pain and denies vomiting or diarrhea.  She has not noted any melena or hematochezia.  ED Course: Upon arrival to the ED, patient is found to be febrile to 38.1 C, saturating upper 80s on room air, slightly tachycardic, and with blood pressure 92/53.  EKG features a sinus rhythm with PAC.  Chemistry panel is notable for potassium 3.4 and creatinine 1.63, up from 0.83 last December.  CBC features a mild leukocytosis, thrombocytosis, and anemia with hemoglobin of 8.9, down from 11.9 in December.  Chest x-ray was concerning for mass versus pneumonia in the left lower lobe and CT chest reveals a centrally necrotic left lower lobe mass suspicious for primary bronchogenic neoplasm and an associated left infrahilar necrotic node concerning for nodal metastasis.  CT of the abdomen and pelvis was performed with notation of left renal lesions concerning for pyelonephritis with possible early abscess.  Blood cultures were collected, liter of normal saline was given, and the patient was treated with potassium and a dose of Levaquin.  Patient will require admission for ongoing evaluation and management of sepsis suspected secondary to pyelonephritis, as well as a lung mass and necrotic infrahilar node concerning for malignancy.   Hospital Course:    New lung mass/renal masses -Certainly concerning  for malignancy, particularly primary bronchogenic carcinoma, given her long-standing tobacco abuse (she quit in 2018 after her CVA but prior to that had been a 2 pack-a-day smoker for over 30 years). -After discussion with oncology, they have recommended  CT-guided biopsy of this lung lesion for diagnostic purposes and to guide treatment plan. -I have discussed case with Dr. Vernard Gambles with interventional radiology.  He recommends PET scan to determine best biopsy site.  PET scan has been arranged for 9/13, patient and her sister have been made aware of this appointment. -In regards to the renal masses, radiology makes comment of possibility of pyelonephritis with early abscess formation, however this does not really fit her clinical picture given negative urine analysis and lack of urinary symptoms.  Nonetheless, we will go ahead and treat with 2 weeks of Levaquin with plan to follow-up imaging afterwards to determine if this is infectious versus metastatic.  Sepsis presumably due to pyelonephritis -Patient certainly met SIRS criteria on admission, although I am not clear that this is truly due to an infectious source. -Despite findings on CT scan that are consistent with pyelonephritis/early abscess formation, a completely negative urine analysis and lack of urinary symptoms would make pyelonephritis very unlikely.  Patient has not been on any recent antibiotics that could affect the results of the culture. -Nonetheless, decision has been made to treat as if she had pyelonephritis with 2 weeks of Levaquin, following completion of antibiotics she will need a repeat CT scan of the abdomen as the main concern is that these lesions of the kidney may be due to metastatic disease more so than abscess formation.  Acute renal failure -Creatinine was 1.6 on admission, down to 1.2 on discharge, unclear if there is some degree of chronicity. -Advised on copious fluid intake. -Will need renal function monitored at time of outpatient follow-up.  Type 2 diabetes -Well-controlled, continue outpatient management.  Remote history of breast cancer -To follow-up with oncology as scheduled.  Procedures:  None   Consultations:  Oncology  Discharge  Instructions  Discharge Instructions    Diet - low sodium heart healthy   Complete by:  As directed    Increase activity slowly   Complete by:  As directed      Allergies as of 10/27/2017      Reactions   Codeine Anaphylaxis, Rash   Penicillins Anaphylaxis, Rash, Other (See Comments)   Has patient had a PCN reaction causing immediate rash, facial/tongue/throat swelling, SOB or lightheadedness with hypotension: Yes Has patient had a PCN reaction causing severe rash involving mucus membranes or skin necrosis: No Has patient had a PCN reaction that required hospitalization: Yes Has patient had a PCN reaction occurring within the last 10 years: No If all of the above answers are "NO", then may proceed with Cephalosporin use.      Medication List    STOP taking these medications   metFORMIN 500 MG tablet Commonly known as:  GLUCOPHAGE   metoprolol succinate 50 MG 24 hr tablet Commonly known as:  TOPROL-XL   topiramate 25 MG tablet Commonly known as:  TOPAMAX     TAKE these medications   ALPRAZolam 1 MG tablet Commonly known as:  XANAX Take 1 tablet (1 mg total) by mouth 2 (two) times daily as needed. for anxiety   amLODipine 5 MG tablet Commonly known as:  NORVASC Take 5 mg by mouth daily.   aspirin 325 MG tablet Take 1 tablet (325 mg total) by mouth daily.   atorvastatin  10 MG tablet Commonly known as:  LIPITOR Take 1 tablet (10 mg total) by mouth daily at 6 PM.   BRILINTA 90 MG Tabs tablet Generic drug:  ticagrelor Take 90 mg by mouth 2 (two) times daily.   cetirizine 10 MG tablet Commonly known as:  ZYRTEC Take 10 mg by mouth at bedtime.   diclofenac sodium 1 % Gel Commonly known as:  VOLTAREN Apply 1 application topically daily as needed (pain).   famotidine 20 MG tablet Commonly known as:  PEPCID Take 20 mg by mouth 2 (two) times daily.   gabapentin 400 MG capsule Commonly known as:  NEURONTIN Take 800 mg by mouth 3 (three) times daily.    levofloxacin 500 MG tablet Commonly known as:  LEVAQUIN Take 1 tablet (500 mg total) by mouth daily for 14 days.   losartan-hydrochlorothiazide 100-12.5 MG tablet Commonly known as:  HYZAAR Take 1 tablet by mouth daily.   montelukast 10 MG tablet Commonly known as:  SINGULAIR Take 10 mg by mouth at bedtime.   oxyCODONE 15 MG immediate release tablet Commonly known as:  ROXICODONE Take 1 tablet (15 mg total) by mouth every 4 (four) hours as needed for pain.   PATADAY 0.2 % Soln Generic drug:  Olopatadine HCl Place 1 drop into both eyes 4 (four) times daily as needed (eye pain).      Allergies  Allergen Reactions  . Codeine Anaphylaxis and Rash  . Penicillins Anaphylaxis, Rash and Other (See Comments)    Has patient had a PCN reaction causing immediate rash, facial/tongue/throat swelling, SOB or lightheadedness with hypotension: Yes Has patient had a PCN reaction causing severe rash involving mucus membranes or skin necrosis: No Has patient had a PCN reaction that required hospitalization: Yes Has patient had a PCN reaction occurring within the last 10 years: No If all of the above answers are "NO", then may proceed with Cephalosporin use.    Follow-up Information    Pavelock, Ralene Bathe, MD. Schedule an appointment as soon as possible for a visit in 2 week(s).   Specialty:  Internal Medicine Contact information: [address] [place] [address] [phone]        [facility] radiology department. Go on 11/03/2017.   Why:  Be there at 6:30 am. Nothing to eat after midnight the night before.       Derek Jack, MD. Schedule an appointment as soon as possible for a visit in 3 week(s).   Specialty:  Hematology Contact information: [address] [place] [address] [phone]            The results of significant diagnostics from this hospitalization (including imaging, microbiology, ancillary and laboratory) are listed below for  reference.    Significant Diagnostic Studies: Dg Chest 2 View  Result Date: 10/25/2017 CLINICAL DATA:  Cough, fever EXAM: CHEST - 2 VIEW COMPARISON:  None. FINDINGS: There is a large rounded opacity in the left lower lobe. There is no other focal parenchymal opacity. There is no pleural effusion or pneumothorax. The heart and mediastinal contours are unremarkable. The osseous structures are unremarkable. IMPRESSION: Large rounded opacity in the left lower lobe. This may reflect pneumonia versus a pulmonary mass. Recommend further evaluation with a CT of the chest with intravenous contrast. Electronically Signed   By: Kathreen Devoid   On: 10/25/2017 09:46   Ct Chest W Contrast  Result Date: 10/25/2017 CLINICAL DATA:  Cough, fever, left lower lobe opacity on chest radiograph. Left  flank pain, fever. EXAM: CT CHEST, ABDOMEN, AND PELVIS WITH CONTRAST TECHNIQUE: Multidetector CT imaging of the chest, abdomen and pelvis was performed following the standard protocol during bolus administration of intravenous contrast. CONTRAST:  85m OMNIPAQUE IOHEXOL 300 MG/ML  SOLN COMPARISON:  Chest radiographs dated 10/25/2017. CT chest abdomen pelvis dated 10/05/2012. FINDINGS: CT CHEST FINDINGS Cardiovascular: Heart is normal in size.  No pericardial effusion. No evidence of thoracic aortic aneurysm. Atherosclerotic calcifications of the aortic arch. 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 No suspicious mediastinal lymphadenopathy. 14 mm short axis low-density left infrahilar node (series 2/image 78), suspicious. Visualized thyroid is unremarkable. Mediastinum/Nodes: No suspicious mediastinal lymphadenopathy. Necrotic 14 mm short axis left infrahilar node (series 2/image 78), suspicious. Visualized thyroid is unremarkable. Lungs/Pleura: 6.9 x 5.2 x 6.8 cm centrally necrotic left lower lobe mass, abutting the pleura/left chest wall, highly suspicious for primary bronchogenic neoplasm. Mass  abuts the left fissure. Mild biapical pleural-parenchymal scarring. No focal consolidation. No pleural effusion or pneumothorax. Musculoskeletal: Mild degenerative changes of the mid thoracic spine. CT ABDOMEN PELVIS FINDINGS Hepatobiliary: Liver is within normal limits, noting focal fat/altered perfusion along the falciform ligament (series 15/image 23). Gallbladder is underdistended. No intrahepatic or extrahepatic ductal dilatation. Pancreas: Within normal limits. Spleen: Cystic lesions in the spleen measuring up to 2.2 cm, likely benign. Adrenals/Urinary Tract: Adrenal glands are within normal limits. Multiple hypoenhancing renal lesions, suggesting pyelonephritis, with possible early renal abscesses measuring up to 3.5 cm in the left upper kidney (series 15/image 29) and 3.1 cm in the right upper kidney (series 15/image 35). No hydronephrosis. Bladder is within normal limits. Stomach/Bowel: Stomach is within normal limits. No evidence of bowel obstruction. Appendix is not discretely visualized. Vascular/Lymphatic: No evidence of abdominal aortic aneurysm. Atherosclerotic calcifications of the abdominal aorta and branch vessels. No suspicious abdominopelvic lymphadenopathy. Reproductive: Status post hysterectomy. No adnexal masses. Other: No abdominopelvic ascites. Musculoskeletal: Mild degenerative changes at L5-S1. IMPRESSION: 6.9 cm centrally necrotic left lower lobe mass, abutting the pleura/left chest wall and left fissure, suspicious for primary bronchogenic neoplasm. Necrotic 14 mm left infrahilar node, suspicious for nodal metastasis. Multiple hypoenhancing renal lesion, suggesting pyelonephritis, with possible early renal abscesses measuring up to 3.5 cm on the left. Technically metastases or lymphoma could have a similar appearance, although this is considered unlikely given the clinical symptoms. Follow-up CT or MRI abdomen with/without contrast is suggested in 3-4 weeks after appropriate  antimicrobial therapy to ensure resolution. 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 Electronically Signed   By: SJulian HyM.D.   On: 10/25/2017 13:16   Ct Abdomen Pelvis W Contrast  Result Date: 10/25/2017 CLINICAL DATA:  Cough, fever, left lower lobe opacity on chest radiograph. Left flank pain, fever. EXAM: CT CHEST, ABDOMEN, AND PELVIS WITH CONTRAST TECHNIQUE: Multidetector CT imaging of the chest, abdomen and pelvis was performed following the standard protocol during bolus administration of intravenous contrast. CONTRAST:  766mOMNIPAQUE IOHEXOL 300 MG/ML  SOLN COMPARISON:  Chest radiographs dated 10/25/2017. CT chest abdomen pelvis dated 10/05/2012. FINDINGS: CT CHEST FINDINGS Cardiovascular: Heart is normal in size.  No pericardial effusion. No evidence of thoracic aortic aneurysm. Atherosclerotic calcifications of the aortic arch. 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 No suspicious mediastinal lymphadenopathy. 14 mm short axis low-density left infrahilar node (series 2/image 78), suspicious. Visualized thyroid is unremarkable. Mediastinum/Nodes: No suspicious mediastinal lymphadenopathy. Necrotic 14 mm short axis left infrahilar node (series 2/image 78), suspicious.  Visualized thyroid is unremarkable. Lungs/Pleura: 6.9 x 5.2 x 6.8 cm centrally necrotic left lower lobe mass, abutting the pleura/left chest wall, highly suspicious for primary bronchogenic neoplasm. Mass abuts the left fissure. Mild biapical pleural-parenchymal scarring. No focal consolidation. No pleural effusion or pneumothorax. Musculoskeletal: Mild degenerative changes of the mid thoracic spine. CT ABDOMEN PELVIS FINDINGS Hepatobiliary: Liver is within normal limits, noting focal fat/altered perfusion along the falciform ligament (series 15/image 23). Gallbladder is underdistended. No intrahepatic or extrahepatic ductal dilatation. Pancreas: Within normal limits.  Spleen: Cystic lesions in the spleen measuring up to 2.2 cm, likely benign. Adrenals/Urinary Tract: Adrenal glands are within normal limits. Multiple hypoenhancing renal lesions, suggesting pyelonephritis, with possible early renal abscesses measuring up to 3.5 cm in the left upper kidney (series 15/image 29) and 3.1 cm in the right upper kidney (series 15/image 35). No hydronephrosis. Bladder is within normal limits. Stomach/Bowel: Stomach is within normal limits. No evidence of bowel obstruction. Appendix is not discretely visualized. Vascular/Lymphatic: No evidence of abdominal aortic aneurysm. Atherosclerotic calcifications of the abdominal aorta and branch vessels. No suspicious abdominopelvic lymphadenopathy. Reproductive: Status post hysterectomy. No adnexal masses. Other: No abdominopelvic ascites. Musculoskeletal: Mild degenerative changes at L5-S1. IMPRESSION: 6.9 cm centrally necrotic left lower lobe mass, abutting the pleura/left chest wall and left fissure, suspicious for primary bronchogenic neoplasm. Necrotic 14 mm left infrahilar node, suspicious for nodal metastasis. Multiple hypoenhancing renal lesion, suggesting pyelonephritis, with possible early renal abscesses measuring up to 3.5 cm on the left. Technically metastases or lymphoma could have a similar appearance, although this is considered unlikely given the clinical symptoms. Follow-up CT or MRI abdomen with/without contrast is suggested in 3-4 weeks after appropriate antimicrobial therapy to ensure resolution. 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 Electronically Signed   By: Julian Hy M.D.   On: 10/25/2017 13:16   US Breast Ltd Uni Left Inc Axilla  Result Date: 10/04/2017 CLINICAL DATA:  Nonfocal pain felt by the patient in the upper outer left chest above her left mastectomy scar for the past 5 years. No palpable mass. Status post left mastectomy for breast cancer in 2009 and prophylactic right  mastectomy in 2009. EXAM: ULTRASOUND OF THE LEFT BREAST COMPARISON:  04/18/2012. FINDINGS: On physical exam, No mass is palpable above the left mastectomy scar in the area of pain reported by the patient. Targeted ultrasound is performed, showing normal appearing subcutaneous fat and underlying muscle in the area of patient concern on the left. IMPRESSION: No evidence of malignancy. RECOMMENDATION: Clinical follow-up. I have discussed the findings and recommendations with the patient. Results were also provided in writing at the conclusion of the visit. If applicable, a reminder letter will be sent to the patient regarding the next appointment. BI-RADS CATEGORY  1: Negative. Electronically Signed   By: Claudie Revering M.D.   On: 10/04/2017 12:38    Microbiology: Recent Results (from the past 240 hour(s))  Blood culture (routine x 2)     Status: None (Preliminary result)   Collection Time: 10/25/17  6:59 PM  Result Value Ref Range Status   Specimen Description RIGHT ANTECUBITAL  Final   Special Requests   Final    BOTTLES DRAWN AEROBIC AND ANAEROBIC Blood Culture adequate volume   Culture   Final    NO GROWTH 2 DAYS Performed at [facility], [address], [place], [place] [address]    Report Status PENDING  Incomplete  Urine Culture     Status: None   Collection  Time: 10/25/17  8:14 PM  Result Value Ref Range Status   Specimen Description   Final    URINE, RANDOM Performed at [facility], [address] [place], [place] [address]    Special Requests   Final    NONE Performed at [facility], [address] [place], [place] [address]    Culture   Final    NO GROWTH Performed at [facility], [place] [address] [place], [place] [address]    Report Status 10/27/2017 FINAL  Final  Blood culture (routine x 2)     Status: None (Preliminary result)   Collection Time: 10/25/17  8:22 PM  Result Value Ref Range Status   Specimen Description BLOOD RIGHT HAND  Final   Special Requests    Final    BOTTLES DRAWN AEROBIC AND ANAEROBIC Blood Culture adequate volume   Culture   Final    NO GROWTH 2 DAYS Performed at [facility], [address], [place], [place] [address]    Report Status PENDING  Incomplete  Expectorated sputum assessment w rflx to resp cult     Status: None   Collection Time: 10/26/17 11:34 PM  Result Value Ref Range Status   Specimen Description SPU  Final   Special Requests EXPECTORATED SPUTUM  Final   Sputum evaluation   Final    THIS SPECIMEN IS ACCEPTABLE FOR SPUTUM CULTURE Performed at [facility], [address], [place], [place] [address]    Report Status 10/27/2017 FINAL  Final     Labs: Basic Metabolic Panel: Recent Labs  Lab 10/25/17 1018 10/25/17 1851 10/26/17 0524 10/27/17 0512  NA 136 135 137 139  K 2.9* 3.4* 3.9 4.3  CL 94* 95* 101 105  CO2 33* 30 28 29   GLUCOSE 111* 115* 123* 111*  BUN 14 16 13 12   CREATININE 1.43* 1.63* 1.36* 1.21*  CALCIUM 9.2 9.1 8.6* 8.4*  MG  --   --  1.9  --    Liver Function Tests: No results for input(s): AST, ALT, ALKPHOS, BILITOT, PROT, ALBUMIN in the last 168 hours. No results for input(s): LIPASE, AMYLASE in the last 168 hours. No results for input(s): AMMONIA in the last 168 hours. CBC: Recent Labs  Lab 10/25/17 1018 10/25/17 1851 10/26/17 0524 10/27/17 0512  WBC 10.1 10.6* 10.0 10.0  NEUTROABS 8.0*  --  7.7  --   HGB 8.6* 8.9* 7.4* 7.2*  HCT 29.0* 29.2* 25.5* 24.9*  MCV 79.7 79.1 79.9 81.6  PLT 540* 596* 561* 513*   Cardiac Enzymes: No results for input(s): CKTOTAL, CKMB, CKMBINDEX, TROPONINI in the last 168 hours. BNP: BNP (last 3 results) No results for input(s): BNP in the last 8760 hours.  ProBNP (last 3 results) No results for input(s): PROBNP in the last 8760 hours.  CBG: Recent Labs  Lab 10/26/17 1118 10/26/17 1612 10/26/17 2217 10/27/17 0742 10/27/17 1210  GLUCAP 133* 131* 130* 160* 91       Signed:  [facility] Pager:  [phone] 10/27/2017, 1:30 PM

## 2017-10-27 NOTE — Telephone Encounter (Signed)
Discussed CT on phone with Dr. Jerilee Hoh in contemplation of possible biopsy.  L lung mass is pleural based and approachable. Recommended staging PET-CT to r/o M1 disease, which may require re-direction of biopsy site. If neg, we can do CT guided core biopsy of lung lesion under moderate sedation, either as inpt or outpt. Multiple renal lesions need followup to differentiate infectious from neoplastic process.

## 2017-10-27 NOTE — Progress Notes (Signed)
Patient is to be discharged home and in stable condition. Patient's IV removed, WNL. Patient given discharge instructions and verbalized understanding. Patient to be escorted out by staff via wheelchair upon sister's arrival.  Celestia Khat, RN

## 2017-10-27 NOTE — Progress Notes (Signed)
Pharmacy Antibiotic Note  [name] is a 57 y.o. female admitted on 10/25/2017 with pyelonephritis.  Pharmacy has been consulted for Levaquin dosing.  Plan: Levaquin 500 mg IV every 24 hours  Monitor labs, c/s, and patient improvement  Height: 5\' 9"  (175.3 cm) Weight: 169 lb 12.8 oz (77 kg) IBW/kg (Calculated) : 66.2  Temp (24hrs), Avg:99.9 F (37.7 C), Min:99.3 F (37.4 C), Max:101.1 F (38.4 C)  Recent Labs  Lab 10/25/17 1018 10/25/17 1851 10/26/17 0524 10/27/17 0512  WBC 10.1 10.6* 10.0 10.0  CREATININE 1.43* 1.63* 1.36* 1.21*    Estimated Creatinine Clearance: 54.3 mL/min (A) (by C-G formula based on SCr of 1.21 mg/dL (H)).    Allergies  Allergen Reactions  . Codeine Anaphylaxis and Rash  . Penicillins Anaphylaxis, Rash and Other (See Comments)    Has patient had a PCN reaction causing immediate rash, facial/tongue/throat swelling, SOB or lightheadedness with hypotension: Yes Has patient had a PCN reaction causing severe rash involving mucus membranes or skin necrosis: No Has patient had a PCN reaction that required hospitalization: Yes Has patient had a PCN reaction occurring within the last 10 years: No If all of the above answers are "NO", then may proceed with Cephalosporin use.     Antimicrobials this admission: Levaquin 9/6 >>  Meropenem 9/4 >> 9/6  Dose adjustments this admission: N/A  Microbiology results: 9/4 BCx: ngtd 9/4 UCx: pending  9/5 Sputum: pending     Thank you for allowing pharmacy to be a part of this patient's care.  Ramond Craver 10/27/2017 8:23 AM

## 2017-10-29 LAB — CULTURE, RESPIRATORY W GRAM STAIN

## 2017-10-29 LAB — CULTURE, RESPIRATORY

## 2017-10-30 LAB — CULTURE, BLOOD (ROUTINE X 2)
Culture: NO GROWTH
Culture: NO GROWTH
SPECIAL REQUESTS: ADEQUATE
Special Requests: ADEQUATE

## 2017-11-01 ENCOUNTER — Other Ambulatory Visit: Payer: Self-pay | Admitting: Internal Medicine

## 2017-11-01 DIAGNOSIS — R918 Other nonspecific abnormal finding of lung field: Secondary | ICD-10-CM

## 2017-11-03 ENCOUNTER — Ambulatory Visit (HOSPITAL_COMMUNITY)
Admit: 2017-11-03 | Discharge: 2017-11-03 | Disposition: A | Discharge status: Home / Self Care | Payer: Medicaid Other | Attending: Internal Medicine | Admitting: Internal Medicine

## 2017-11-03 DIAGNOSIS — R590 Localized enlarged lymph nodes: Secondary | ICD-10-CM | POA: Insufficient documentation

## 2017-11-03 DIAGNOSIS — I700 Atherosclerosis of aorta: Secondary | ICD-10-CM | POA: Diagnosis not present

## 2017-11-03 DIAGNOSIS — R918 Other nonspecific abnormal finding of lung field: Secondary | ICD-10-CM

## 2017-11-03 LAB — GLUCOSE, CAPILLARY: GLUCOSE-CAPILLARY: 106 mg/dL — AB (ref 70–99)

## 2017-11-03 MED ORDER — FLUDEOXYGLUCOSE F - 18 (FDG) INJECTION
8.4000 | Freq: Once | INTRAVENOUS | Status: AC | PRN
  Administered 2017-11-03: 8.4 via INTRAVENOUS

## 2017-11-06 ENCOUNTER — Inpatient Hospital Stay (HOSPITAL_COMMUNITY): Payer: Medicaid Other | Attending: Internal Medicine | Admitting: Internal Medicine

## 2017-11-06 ENCOUNTER — Ambulatory Visit (HOSPITAL_COMMUNITY)
Admission: RE | Admit: 2017-11-06 | Discharge: 2017-11-06 | Disposition: A | Discharge status: Home / Self Care | Payer: Medicaid Other | Source: Ambulatory Visit | Attending: Internal Medicine | Admitting: Internal Medicine

## 2017-11-06 ENCOUNTER — Other Ambulatory Visit: Payer: Self-pay

## 2017-11-06 ENCOUNTER — Encounter (HOSPITAL_COMMUNITY): Payer: Self-pay | Admitting: Internal Medicine

## 2017-11-06 VITALS — BP 88/61 | HR 96 | Temp 98.6°F | Resp 18 | Wt 164.5 lb

## 2017-11-06 DIAGNOSIS — R7989 Other specified abnormal findings of blood chemistry: Secondary | ICD-10-CM | POA: Diagnosis not present

## 2017-11-06 DIAGNOSIS — D638 Anemia in other chronic diseases classified elsewhere: Secondary | ICD-10-CM

## 2017-11-06 DIAGNOSIS — R918 Other nonspecific abnormal finding of lung field: Secondary | ICD-10-CM

## 2017-11-06 DIAGNOSIS — I10 Essential (primary) hypertension: Secondary | ICD-10-CM | POA: Diagnosis not present

## 2017-11-06 DIAGNOSIS — M7989 Other specified soft tissue disorders: Secondary | ICD-10-CM | POA: Insufficient documentation

## 2017-11-06 DIAGNOSIS — Z9012 Acquired absence of left breast and nipple: Secondary | ICD-10-CM | POA: Diagnosis not present

## 2017-11-06 DIAGNOSIS — C50919 Malignant neoplasm of unspecified site of unspecified female breast: Secondary | ICD-10-CM | POA: Insufficient documentation

## 2017-11-06 DIAGNOSIS — D649 Anemia, unspecified: Secondary | ICD-10-CM

## 2017-11-06 DIAGNOSIS — Z853 Personal history of malignant neoplasm of breast: Secondary | ICD-10-CM

## 2017-11-06 DIAGNOSIS — M79602 Pain in left arm: Secondary | ICD-10-CM

## 2017-11-06 DIAGNOSIS — R935 Abnormal findings on diagnostic imaging of other abdominal regions, including retroperitoneum: Secondary | ICD-10-CM

## 2017-11-06 LAB — COMPREHENSIVE METABOLIC PANEL
ALK PHOS: 81 U/L (ref 38–126)
ALT: 7 U/L (ref 0–44)
AST: 9 U/L — ABNORMAL LOW (ref 15–41)
Albumin: 2.9 g/dL — ABNORMAL LOW (ref 3.5–5.0)
Anion gap: 10 (ref 5–15)
BILIRUBIN TOTAL: 0.5 mg/dL (ref 0.3–1.2)
BUN: 15 mg/dL (ref 6–20)
CALCIUM: 9.4 mg/dL (ref 8.9–10.3)
CO2: 31 mmol/L (ref 22–32)
Chloride: 94 mmol/L — ABNORMAL LOW (ref 98–111)
Creatinine, Ser: 1.43 mg/dL — ABNORMAL HIGH (ref 0.44–1.00)
GFR, EST AFRICAN AMERICAN: 46 mL/min — AB (ref >60)
GFR, EST NON AFRICAN AMERICAN: 40 mL/min — AB (ref >60)
Glucose, Bld: 100 mg/dL — ABNORMAL HIGH (ref 70–99)
Potassium: 3.6 mmol/L (ref 3.5–5.1)
Sodium: 135 mmol/L (ref 135–145)
TOTAL PROTEIN: 8 g/dL (ref 6.5–8.1)

## 2017-11-06 LAB — CBC WITH DIFFERENTIAL/PLATELET
Basophils Absolute: 0 10*3/uL (ref 0.0–0.1)
Basophils Relative: 0 %
EOS ABS: 0.2 10*3/uL (ref 0.0–0.7)
Eosinophils Relative: 1 %
HEMATOCRIT: 26 % — AB (ref 36.0–46.0)
HEMOGLOBIN: 7.9 g/dL — AB (ref 12.0–15.0)
Lymphocytes Relative: 14 %
Lymphs Abs: 1.7 10*3/uL (ref 0.7–4.0)
MCH: 23.1 pg — ABNORMAL LOW (ref 26.0–34.0)
MCHC: 30.4 g/dL (ref 30.0–36.0)
MCV: 76 fL — ABNORMAL LOW (ref 78.0–100.0)
MONOS PCT: 7 %
Monocytes Absolute: 0.8 10*3/uL (ref 0.1–1.0)
NEUTROS PCT: 78 %
Neutro Abs: 9.3 10*3/uL — ABNORMAL HIGH (ref 1.7–7.7)
Platelets: 625 10*3/uL — ABNORMAL HIGH (ref 150–400)
RBC: 3.42 MIL/uL — AB (ref 3.87–5.11)
RDW: 20.8 % — ABNORMAL HIGH (ref 11.5–15.5)
WBC: 11.9 10*3/uL — AB (ref 4.0–10.5)

## 2017-11-06 LAB — SAMPLE TO BLOOD BANK

## 2017-11-06 LAB — LACTATE DEHYDROGENASE: LDH: 128 U/L (ref 98–192)

## 2017-11-06 LAB — FERRITIN: FERRITIN: 332 ng/mL — AB (ref 11–307)

## 2017-11-06 LAB — PREPARE RBC (CROSSMATCH)

## 2017-11-06 MED ORDER — FUROSEMIDE 10 MG/ML IJ SOLN
20.0000 mg | Freq: Once | INTRAMUSCULAR | Status: AC
  Administered 2017-11-06: 20 mg via INTRAVENOUS

## 2017-11-06 NOTE — Progress Notes (Signed)
Diagnosis Malignant neoplasm of female breast, unspecified estrogen receptor status, unspecified laterality, unspecified site of breast ([place]) - Plan: CBC with Differential/Platelet, Comprehensive metabolic panel, Lactate dehydrogenase, Ferritin, Cancer antigen 27.29, Cancer antigen 15-3, Type and screen, US Venous Img Lower Bilateral, Lactate dehydrogenase, Comprehensive metabolic panel, CBC with Differential/Platelet, Type and screen, Cancer antigen 15-3, Cancer antigen 27.29, Ferritin  Anemia in other chronic diseases classified elsewhere - Plan: CBC with Differential/Platelet, Comprehensive metabolic panel, Lactate dehydrogenase, Ferritin, Cancer antigen 27.29, Cancer antigen 15-3, Type and screen, US Venous Img Lower Bilateral, Lactate dehydrogenase, Comprehensive metabolic panel, CBC with Differential/Platelet, Type and screen, Cancer antigen 15-3, Cancer antigen 27.29, Ferritin, Sample to Blood Bank, Sample to Blood Bank, Practitioner attestation of consent, Complete patient signature process for consent form, Care order/instruction, 0.9 %  sodium chloride infusion (Manually program via Guardrails IV Fluids), sodium chloride flush (NS) 0.9 % injection 10 mL, Prepare RBC, Transfuse RBC, diphenhydrAMINE (BENADRYL) capsule 25 mg, acetaminophen (TYLENOL) tablet 650 mg, furosemide (LASIX) injection 20 mg, Prepare RBC  Staging Cancer Staging H/O Stage III Breast cancer, left Staging form: Breast, AJCC 7th Edition - Clinical: Stage IIIA (T3, N1, cM0) - Signed by Baird Cancer, PA-C on 12/06/2012   Assessment and Plan:  H/O Stage III Breast cancer, left 1.  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 Primary was 5.9 cm clinically and radiographically with 1 hypermetabolic lymph node.  At the time of surgery, cancer was still 2.5 cm in size, but 10 nodes were negative.  LVI was not identified.  ER negative, PR negative, Her2 negative.  Initial biopsy was on  06/18/2007 and her mastectomy was on 8/21/200  All therapy was completed on 04/18/2008. Reportedly noncompliant with appointment and surveillance recommendations.  Pt was last seen at [facility] in 2017.    Pt was seen as hospital consult by Dr. Worthy Keeler.  She had CT CAP done 10/25/2017 that showed  IMPRESSION: 6.9 cm centrally necrotic left lower lobe mass, abutting the pleura/left chest wall and left fissure, suspicious for primary bronchogenic neoplasm.  Necrotic 14 mm left infrahilar node, suspicious for nodal metastasis.  Multiple hypoenhancing renal lesion, suggesting pyelonephritis, with possible early renal abscesses measuring up to 3.5 cm on the left. Technically metastases or lymphoma could have a similar appearance, although this is considered unlikely given the clinical symptoms. Follow-up CT or MRI abdomen with/without contrast is suggested in 3-4 weeks after appropriate antimicrobial therapy to ensure resolution.  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  She was set up for PET scan that was done 11/03/2017 that showed  IMPRESSION: 1. Left lower lobe necrotic lung mass is identified and is intensely hypermetabolic compatible with primary bronchogenic carcinoma. Correlation with tissue sampling is advised. 2. Hypermetabolic left hilar lymph nodes compatible with metastatic adenopathy. 3. Bilateral kidney masses are identified which appear low-attenuation and exhibit increased FDG uptake. In the absence of signs or symptoms of urinary tract infection findings are highly concerning for bilateral kidney metastasis. 4. Hypermetabolic retroperitoneal lymph node worrisome for metastatic adenopathy. 5. Nonspecific focus of intense uptake localizing to the posterior wall the proximal stomach. Although this may represent an area of physiologic activity, primary or metastatic lesion cannot be excluded. 6. Focal area of intense uptake identified within the  soft tissues of the left upper arm with corresponding 3.3 cm soft tissue nodule. Suspicious for metastatic disease. 7. Mild FDG uptake is associated with small right level 2 cervical lymph nodes. Nonspecific.  8.  Aortic Atherosclerosis (ICD10-I70.0).  IR to review scans for biopsy.  Once diagnosis ascertained, she will follow-up with Dr. Worthy Keeler to discuss treatment options.  Suspect lung cancer versus metastatic breast cancer.  Breast tumor markers obtained today.    2.  Left arm pain.  Pet scan shows nodule in soft tissues of left upper arm.  She had pain meds prescribed by hospitalist team.  PT to pick up RX for [employer]    3.  Anemia.  Labs done today 11/06/2017 reviewed and showed WBC 11.9 HB 7.9 plts 625,000. Ferritin 332.  Pt will be transfused with 1 U PRBCs.  She will have labs on RTC.    4.  LE swelling.  Bilateral LE doppler done today 11/06/2017 was negative for DVT.  Pt will receive Lasix in clinic today and after blood transfusion tomorrow.    5.  HTN.  BP is 88/61.  Follow-up with PCP for monitoring.    6.  Pain.  Pt is followed by pain clinic.  Rx for pain meds given to pt by Hospitalist team.    7.  Thrombocytosis.  Plt count elevated at 625,000.  Likely reactive due to anemia.  Will repeat CBC on RTC.    Greater than 30 minutes spent with more than 50% spent in counseling and coordination of care.    Interval History:  Historical data obtained from note dated 05/06/2015.  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 Primary was 5.9 cm clinically and radiographically with 1 hypermetabolic lymph node.  At the time of surgery, cancer was still 2.5 cm in size, but 10 nodes were negative.  LVI was not identified.  ER negative, PR negative, Her2 negative.  Initial biopsy was on 06/18/2007 and her mastectomy was on 8/21/200  All therapy was completed on 04/18/2008. Reportedly noncompliant with appointment  and surveillance recommendations.  Pt was last seen at [facility] in 2017.    Current Status:  Pt is seen today for follow-up to go over scan.  She is complaining of left arm pain and LE swelling.       H/O Stage III Breast cancer, left   06/18/2007 Initial Diagnosis    H/O Stage III Breast cancer, ER-, PR-, Her2-.  Primary was 5.9 cm in the left breast.  Grade 3 tumor.  1 lymph node hypermetabolic on PET scan in 7867 suspicous for metastatic disease.    08/06/2007 - 09/18/2007 Chemotherapy    Dose dense AC, pre-operatively    10/12/2007 Surgery    B/L mastectomies by Dr. Arnoldo Morale    12/26/2007 - 04/18/2008 Chemotherapy    Taxol weekly x 11    04/19/2008 Remission         Problem List Patient Active Problem List   Diagnosis Date Noted  . Mass of lower lobe of left lung [R91.8] 10/25/2017  . Pyelonephritis [N12] 10/25/2017  . Normocytic anemia [D64.9] 10/25/2017  . Hypokalemia [E87.6] 10/25/2017  . Acute bilat watershed infarction [facility]) [I63.89] 10/11/2016  . 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 History of CVA (cerebrovascular accident) [Z86.73] 10/09/2016  . Chronic pain syndrome [G89.4] 10/09/2016  . Diabetes mellitus type 2 in obese ([place]) [E11.69, E66.9] 10/09/2016  . Renal insufficiency [N28.9]   . Right-sided muscle weakness [M62.81]   . Eye drainage [H57.89] 02/28/2014  . Conjunctivitis [H10.9] 02/28/2014  . Back pain [M54.9] 02/28/2014  .  Hyperlipidemia [E78.5] 12/07/2012  . Tobacco abuse [Z72.0] 12/07/2012  . Psoriasis [L40.9] 12/13/2010  . COPD (chronic obstructive pulmonary disease) ([place]) [J44.9] 09/14/2010  . Marijuana abuse in the past [F12.10] 09/14/2010  . H/O MRSA infection [A49.02] 09/14/2010  . Anxiety state [F41.1] 05/13/2009  . DEPRESSION [F32.9] 05/13/2009  . Essential hypertension [I10] 05/13/2009  . H/O Stage III Breast cancer, left [C50.919] 05/13/2009    Past Medical  History Past Medical History:  Diagnosis Date  . Asthma   . Back muscle spasm 12/13/2010  . Cancer [facility])    breast cancer, remission 2010  . Chronic back pain   . Chronic foot pain   . COPD (chronic obstructive pulmonary disease) ([place]) 09/14/2010  . Diabetes mellitus without complication ([place])   . H/O MRSA infection 09/14/2010  . High blood cholesterol   . Hyperlipidemia 12/07/2012  . Hypertension   . Lung mass   . 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 Memory loss   . Psoriasis 12/13/2010  . Stroke ([place])   . Tobacco abuse 12/07/2012    Past Surgical History Past Surgical History:  Procedure Laterality Date  . ABDOMINAL HYSTERECTOMY    . CESAREAN SECTION     x7  . IR ANGIO INTRA EXTRACRAN SEL COM CAROTID INNOMINATE BILAT MOD SED  10/10/2016  . IR ANGIO VERTEBRAL SEL SUBCLAVIAN INNOMINATE UNI R MOD SED  10/10/2016  . IR ANGIOGRAM EXTREMITY LEFT  10/10/2016  . MASTECTOMY     left  . MASTECTOMY Right 2009   prophylactic  . PORT-A-CATH REMOVAL  06/01/2011   Procedure: REMOVAL PORT-A-CATH;  Surgeon: Donato Heinz, MD;  Location: [facility];  Service: General;  Laterality: N/A;  Minor Room  . portacath insertion    . RADIOLOGY WITH ANESTHESIA N/A 01/23/2017   Procedure: ANGIOPLASTY WITH STENTING;  Surgeon: Luanne Bras, MD;  Location: [place];  Service: Radiology;  Laterality: N/A;    Family History Family History  Problem Relation Age of Onset  . Stroke Mother   . Stroke Father   . Cancer Maternal Aunt   . Cancer Maternal Grandmother   . Cancer Maternal Aunt   . Cancer Maternal Aunt      Social History  reports that she has been smoking. She has a 9.00 pack-year smoking history. She has never used smokeless tobacco. She reports that she drank alcohol. She reports that she has current or past drug history. Drug: Marijuana.  Medications  Current Outpatient Medications:  .  ALPRAZolam (XANAX) 1 MG tablet, Take 1 tablet (1 mg total) by mouth 2 (two) times daily as  needed. for anxiety, Disp: 20 tablet, Rfl: 0 .  amLODipine (NORVASC) 5 MG tablet, Take 5 mg by mouth daily., Disp: , Rfl:  .  aspirin 325 MG tablet, Take 1 tablet (325 mg total) by mouth daily., Disp: 30 tablet, Rfl: 0 .  atorvastatin (LIPITOR) 10 MG tablet, Take 1 tablet (10 mg total) by mouth daily at 6 PM., Disp: 30 tablet, Rfl: 0 .  cetirizine (ZYRTEC) 10 MG tablet, Take 10 mg by mouth at bedtime., Disp: , Rfl:  .  diclofenac sodium (VOLTAREN) 1 % GEL, Apply 1 application topically daily as needed (pain)., Disp: , Rfl:  .  famotidine (PEPCID) 20 MG tablet, Take 20 mg by mouth 2 (two) times daily., Disp: , Rfl:  .  gabapentin (NEURONTIN) 400 MG capsule, Take 800 mg by mouth 3 (three) times daily. , Disp: , Rfl:  .  levofloxacin (LEVAQUIN) 500 MG  tablet, Take 1 tablet (500 mg total) by mouth daily for 14 days., Disp: 14 tablet, Rfl: 0 .  losartan-hydrochlorothiazide (HYZAAR) 100-12.5 MG tablet, Take 1 tablet by mouth daily., Disp: , Rfl:  .  montelukast (SINGULAIR) 10 MG tablet, Take 10 mg by mouth at bedtime., Disp: , Rfl:  .  Olopatadine HCl (PATADAY) 0.2 % SOLN, Place 1 drop into both eyes 4 (four) times daily as needed (eye pain). , Disp: , Rfl:  .  oxyCODONE (ROXICODONE) 15 MG immediate release tablet, Take 1 tablet (15 mg total) by mouth every 4 (four) hours as needed for pain., Disp: 30 tablet, Rfl: 0 .  ticagrelor (BRILINTA) 90 MG TABS tablet, Take 90 mg by mouth 2 (two) times daily., Disp: , Rfl:   Allergies Codeine and Penicillins  Review of Systems Review of Systems - Oncology ROS negative other than LE swelling and left arm pain.     Physical Exam  Vitals Wt Readings from Last 3 Encounters:  11/06/17 164 lb 8 oz (74.6 kg)  10/25/17 169 lb 12.8 oz (77 kg)  05/31/17 179 lb (81.2 kg)   Temp Readings from Last 3 Encounters:  11/06/17 98.6 F (37 C) (Oral)  10/27/17 (!) 101.1 F (38.4 C) (Oral)  10/25/17 98.6 F (37 C) (Oral)   BP Readings from Last 3 Encounters:   11/06/17 (!) 88/61  10/27/17 99/62  10/25/17 94/65   Pulse Readings from Last 3 Encounters:  11/06/17 96  10/27/17 88  10/25/17 91   Constitutional: Well-developed, well-nourished, and in no distress.   HENT: Head: Normocephalic and atraumatic.  Mouth/Throat: No oropharyngeal exudate. Mucosa moist. Eyes: Pupils are equal, round, and reactive to light. Conjunctivae are normal. No scleral icterus.  Neck: Normal range of motion. Neck supple. No JVD present.  Cardiovascular: Normal rate, regular rhythm and normal heart sounds.  Exam reveals no gallop and no friction rub.   No murmur heard. Pulmonary/Chest: Effort normal and breath sounds normal. No respiratory distress. No wheezes.No rales.  Abdominal: Soft. Bowel sounds are normal. No distension. There is no tenderness. There is no guarding.  Musculoskeletal: Bilateral LE swelling 2+.  Decreased ROM of left arm due to pain.   Lymphadenopathy: No cervical, axillary or supraclavicular adenopathy.  Neurological: Alert and oriented to person, place, and time. No cranial nerve deficit.  Skin: Skin is warm and dry. No rash noted. No erythema. No pallor.  Psychiatric: Affect and judgment normal.  Breast exam:  Chaperone present.  No palpable chest wall lesions noted bilaterally.    Labs Office Visit on 11/06/2017  Component Date Value Ref Range Status  . LDH 11/06/2017 128  98 - 192 U/L Final   Performed at [facility], [address], [place], [place] [address]  . Sodium 11/06/2017 135  135 - 145 mmol/L Final  . Potassium 11/06/2017 3.6  3.5 - 5.1 mmol/L Final  . Chloride 11/06/2017 94* 98 - 111 mmol/L Final  . CO2 11/06/2017 31  22 - 32 mmol/L Final  . Glucose, Bld 11/06/2017 100* 70 - 99 mg/dL Final  . BUN 11/06/2017 15  6 - 20 mg/dL Final  . Creatinine, Ser 11/06/2017 1.43* 0.44 - 1.00 mg/dL Final  . Calcium 11/06/2017 9.4  8.9 - 10.3 mg/dL Final  . Total Protein 11/06/2017 8.0  6.5 - 8.1 g/dL Final  . Albumin 11/06/2017 2.9* 3.5 -  5.0 g/dL Final  . AST 11/06/2017 9* 15 - 41 U/L Final  . ALT 11/06/2017 7  0 - 44  U/L Final  . Alkaline Phosphatase 11/06/2017 81  38 - 126 U/L Final  . Total Bilirubin 11/06/2017 0.5  0.3 - 1.2 mg/dL Final  . GFR calc non Af Amer 11/06/2017 40* >60 mL/min Final  . GFR calc Af Amer 11/06/2017 46* >60 mL/min Final   Comment: (NOTE) The eGFR has been calculated using the CKD EPI equation. This calculation has not been validated in all clinical situations. eGFR's persistently <60 mL/min signify possible Chronic Kidney Disease.   Georgiann Hahn gap 11/06/2017 10  5 - 15 Final   Performed at [facility], [address] [place], [place] [address]  . WBC 11/06/2017 11.9* 4.0 - 10.5 K/uL Final  . RBC 11/06/2017 3.42* 3.87 - 5.11 MIL/uL Final  . Hemoglobin 11/06/2017 7.9* 12.0 - 15.0 g/dL Final  . HCT 11/06/2017 26.0* 36.0 - 46.0 % Final  . MCV 11/06/2017 76.0* 78.0 - 100.0 fL Final  . MCH 11/06/2017 23.1* 26.0 - 34.0 pg Final  . MCHC 11/06/2017 30.4  30.0 - 36.0 g/dL Final  . RDW 11/06/2017 20.8* 11.5 - 15.5 % Final  . Platelets 11/06/2017 625* 150 - 400 K/uL Final  . Neutrophils Relative % 11/06/2017 78  % Final  . Neutro Abs 11/06/2017 9.3* 1.7 - 7.7 K/uL Final  . Lymphocytes Relative 11/06/2017 14  % Final  . Lymphs Abs 11/06/2017 1.7  0.7 - 4.0 K/uL Final  . Monocytes Relative 11/06/2017 7  % Final  . Monocytes Absolute 11/06/2017 0.8  0.1 - 1.0 K/uL Final  . Eosinophils Relative 11/06/2017 1  % Final  . Eosinophils Absolute 11/06/2017 0.2  0.0 - 0.7 K/uL Final  . Basophils Relative 11/06/2017 0  % Final  . Basophils Absolute 11/06/2017 0.0  0.0 - 0.1 K/uL Final   Performed at [facility], [address], [place], [place] [address]  . ABO/RH(D) 11/06/2017 O POS   Final  . Antibody Screen 11/06/2017 NEG   Final  . Sample Expiration 11/06/2017    Final                   Value:11/09/2017 Performed at [facility], [address] [place], [place] [address]   . Unit Number 11/06/2017  [id number]   Final  . Blood Component Type 11/06/2017 RED CELLS,LR   Final  . Unit division 11/06/2017 00   Final  . Status of Unit 11/06/2017 ALLOCATED   Final  . Transfusion Status 11/06/2017 OK TO TRANSFUSE   Final  . Crossmatch Result 11/06/2017 Compatible   Final  . Unit Number 11/06/2017 [id number]   Final  . Blood Component Type 11/06/2017 RED CELLS,LR   Final  . Unit division 11/06/2017 00   Final  . Status of Unit 11/06/2017 ALLOCATED   Final  . Transfusion Status 11/06/2017 OK TO TRANSFUSE   Final  . Crossmatch Result 11/06/2017 Compatible   Final  . Ferritin 11/06/2017 332* 11 - 307 ng/mL Final   Performed at [facility], [address], [place], [place] [address]  . Blood Bank Specimen 11/06/2017 SAMPLE AVAILABLE FOR TESTING   Final  . Sample Expiration 11/06/2017    Final                   Value:11/09/2017 Performed at [facility], [address] [place],  [address]   . Order Confirmation 11/06/2017    Final                   Value:ORDER PROCESSED BY BLOOD  BANK Performed at [facility], [address] [place], [place] [address]   . Blood Product Unit Number 11/06/2017 [id number]   Final  . Unit Type and Rh 11/06/2017 5100   Final  . Blood Product Expiration Date 11/06/2017 [id number]   Final  . Blood Product Unit Number 11/06/2017 [id number]   Final  . Unit Type and Rh 11/06/2017 5100   Final  . Blood Product Expiration Date 11/06/2017 [id number]   Final     Pathology Orders Placed This Encounter  Procedures  . US Venous Img Lower Bilateral    Standing Status:   Future    Number of Occurrences:   1    Standing Expiration Date:   11/06/2018    Order Specific Question:   Reason for Exam (SYMPTOM  OR DIAGNOSIS REQUIRED)    Answer:   bilateral LE swelling    Order Specific Question:   Preferred imaging location?    Answer:   [facility]    Order Specific Question:   Call Results- Best Contact Number?    Answer:   [phone]...Marland KitchenPT  CAN LEAVE  . CBC with Differential/Platelet    Standing Status:   Future    Number of Occurrences:   1    Standing Expiration Date:   11/07/2018  . Comprehensive metabolic panel    Standing Status:   Future    Number of Occurrences:   1    Standing Expiration Date:   11/07/2018  . Lactate dehydrogenase    Standing Status:   Future    Number of Occurrences:   1    Standing Expiration Date:   11/07/2018  . Ferritin    Standing Status:   Future    Number of Occurrences:   1    Standing Expiration Date:   11/07/2018  . Cancer antigen 27.29    Standing Status:   Future    Number of Occurrences:   1    Standing Expiration Date:   11/07/2018  . Cancer antigen 15-3    Standing Status:   Future    Number of Occurrences:   1    Standing Expiration Date:   11/07/2018  . Practitioner attestation of consent    I, the ordering practitioner, attest that I have discussed with the patient the benefits, risks, side effects, alternatives, likelihood of achieving goals and potential problems during recovery for the procedure listed.    Standing Status:   Future    Standing Expiration Date:   11/06/2018    Order Specific Question:   Procedure    Answer:   Blood Product(s)  . Complete patient signature process for consent form    Standing Status:   Future    Standing Expiration Date:   11/06/2018  . Care order/instruction    Transfuse Parameters    Standing Status:   Future    Standing Expiration Date:   11/06/2018  . Type and screen    2 units PRBC    Standing Status:   Future    Number of Occurrences:   1    Standing Expiration Date:   11/07/2018  . Sample to Blood Bank    Standing Status:   Future    Number of Occurrences:   1    Standing Expiration Date:   11/06/2018  . Prepare RBC    Standing Status:   Standing    Number of Occurrences:   1    Order Specific Question:   # of  Units    Answer:   1 unit    Order Specific Question:   Transfusion Indications    Answer:   Symptomatic Anemia     Order Specific Question:   If emergent release call blood bank    Answer:   Not emergent release       Zoila Shutter MD

## 2017-11-06 NOTE — Progress Notes (Signed)
Per Dr. Walden Field, I called [facility] and verified that patient could get her prescription for oxycodone 15mg  1 tab every 4 hours that is on file earlier than it is to be filled.  I spoke with Nicki Reaper at [facility] and advised and he states that he will fill it for the patient.

## 2017-11-06 NOTE — Patient Instructions (Signed)
[place] at [facility]  Discharge Instructions:  You saw Dr. Walden Field today.  Your lab work from 10/27/17 was reviewed with you by Dr. Walden Field.   We need to repeat your lab work today because your hemoglobin was 7.3. You may need a blood transfusion.  Your PET scan showed a mass on your left lung and your kidneys. It also showed lymph node involvement. You need to have a biopsy of those areas. We will get that scheduled with radiology.  You were given a copy of your PET scan results by Dr. Walden Field.  We will order a doppler of your legs because of the increased swelling. This will check for a blood clot. If there is no clot, we will give you some Lasix (fluid pill) to help remove the fluid.    _______________________________________________________________  Thank you for choosing [place] at [facility] to provide your oncology and hematology care.  To afford each patient quality time with our providers, please arrive at least 15 minutes before your scheduled appointment.  You need to re-schedule your appointment if you arrive 10 or more minutes late.  We strive to give you quality time with our providers, and arriving late affects you and other patients whose appointments are after yours.  Also, if you no show three or more times for appointments you may be dismissed from the clinic.  Again, thank you for choosing [place] at [place] hope is that these requests will allow you access to exceptional care and in a timely manner. _______________________________________________________________  If you have questions after your visit, please contact our office at [phone] between the hours of 8:30 a.m. and 5:00 p.m. Voicemails left after 4:30 p.m. will not be returned until the following business day. _______________________________________________________________  For prescription refill requests, have your pharmacy  contact our office. _______________________________________________________________  Recommendations made by the consultant and any test results will be sent to your referring physician. _______________________________________________________________

## 2017-11-07 ENCOUNTER — Encounter (HOSPITAL_COMMUNITY): Payer: Self-pay

## 2017-11-07 ENCOUNTER — Other Ambulatory Visit: Payer: Self-pay | Admitting: Internal Medicine

## 2017-11-07 ENCOUNTER — Other Ambulatory Visit (HOSPITAL_COMMUNITY): Payer: Self-pay | Admitting: Internal Medicine

## 2017-11-07 ENCOUNTER — Inpatient Hospital Stay (HOSPITAL_COMMUNITY): Payer: Medicaid Other

## 2017-11-07 DIAGNOSIS — C50919 Malignant neoplasm of unspecified site of unspecified female breast: Secondary | ICD-10-CM

## 2017-11-07 DIAGNOSIS — D638 Anemia in other chronic diseases classified elsewhere: Secondary | ICD-10-CM

## 2017-11-07 DIAGNOSIS — Z853 Personal history of malignant neoplasm of breast: Secondary | ICD-10-CM | POA: Diagnosis not present

## 2017-11-07 MED ORDER — FUROSEMIDE 10 MG/ML IJ SOLN
20.0000 mg | Freq: Once | INTRAMUSCULAR | Status: AC
  Administered 2017-11-07: 20 mg via INTRAVENOUS
  Filled 2017-11-07: qty 2

## 2017-11-07 MED ORDER — SODIUM CHLORIDE 0.9% IV SOLUTION
250.0000 mL | Freq: Once | INTRAVENOUS | Status: AC
  Administered 2017-11-07: 250 mL via INTRAVENOUS

## 2017-11-07 MED ORDER — ACETAMINOPHEN 325 MG PO TABS
650.0000 mg | ORAL_TABLET | Freq: Once | ORAL | Status: AC
  Administered 2017-11-07: 650 mg via ORAL
  Filled 2017-11-07: qty 2

## 2017-11-07 MED ORDER — SODIUM CHLORIDE 0.9% FLUSH
10.0000 mL | INTRAVENOUS | Status: AC | PRN
  Administered 2017-11-07: 10 mL

## 2017-11-07 MED ORDER — DIPHENHYDRAMINE HCL 25 MG PO CAPS
25.0000 mg | ORAL_CAPSULE | Freq: Once | ORAL | Status: AC
  Administered 2017-11-07: 25 mg via ORAL
  Filled 2017-11-07: qty 1

## 2017-11-07 NOTE — Progress Notes (Signed)
[name] tolerated blood transfusion well without complaints or incident. VSS upon discharge. Pt discharged self ambulatory in satisfactory condition accompanied by family member

## 2017-11-07 NOTE — Patient Instructions (Signed)
[place] at [facility] Discharge Instructions  Received 1 unit of blood today. Follow-up as scheduled. Call clinic for any questions or concerns   Thank you for choosing [place] at [facility] to provide your oncology and hematology care.  To afford each patient quality time with our provider, please arrive at least 15 minutes before your scheduled appointment time.   If you have a lab appointment with the [facility] please come in thru the  Main Entrance and check in at the main information desk  You need to re-schedule your appointment should you arrive 10 or more minutes late.  We strive to give you quality time with our providers, and arriving late affects you and other patients whose appointments are after yours.  Also, if you no show three or more times for appointments you may be dismissed from the clinic at the providers discretion.     Again, thank you for choosing [facility].  Our hope is that these requests will decrease the amount of time that you wait before being seen by our physicians.       _____________________________________________________________  Should you have questions after your visit to [facility], please contact our office at [phone] between the hours of 8:00 a.m. and 4:30 p.m.  Voicemails left after 4:00 p.m. will not be returned until the following business day.  For prescription refill requests, have your pharmacy contact our office and allow 72 hours.    Cancer Center Support Programs:   > Cancer Support Group  2nd Tuesday of the month 1pm-2pm, Journey Room

## 2017-11-08 LAB — CANCER ANTIGEN 27.29: CA 27.29: 45.6 U/mL — ABNORMAL HIGH (ref 0.0–38.6)

## 2017-11-08 LAB — CANCER ANTIGEN 15-3: CAN 15 3: 43.3 U/mL — AB (ref 0.0–25.0)

## 2017-11-10 LAB — TYPE AND SCREEN
ABO/RH(D): O POS
Antibody Screen: NEGATIVE
UNIT DIVISION: 0
UNIT DIVISION: 0

## 2017-11-10 LAB — BPAM RBC
BLOOD PRODUCT EXPIRATION DATE: 201910162359
Blood Product Expiration Date: 201910162359
ISSUE DATE / TIME: 201909171116
UNIT TYPE AND RH: 5100
UNIT TYPE AND RH: 5100

## 2017-11-13 ENCOUNTER — Other Ambulatory Visit: Payer: Self-pay | Admitting: Radiology

## 2017-11-14 ENCOUNTER — Other Ambulatory Visit: Payer: Self-pay

## 2017-11-14 ENCOUNTER — Ambulatory Visit (HOSPITAL_COMMUNITY)
Admission: RE | Admit: 2017-11-14 | Discharge: 2017-11-14 | Disposition: A | Discharge status: Home / Self Care | Payer: Medicaid Other | Source: Ambulatory Visit | Attending: Internal Medicine | Admitting: Internal Medicine

## 2017-11-14 ENCOUNTER — Other Ambulatory Visit (HOSPITAL_COMMUNITY): Payer: Self-pay | Admitting: Internal Medicine

## 2017-11-14 ENCOUNTER — Ambulatory Visit (HOSPITAL_COMMUNITY)
Admission: RE | Admit: 2017-11-14 | Discharge: 2017-11-14 | Disposition: A | Discharge status: Home / Self Care | Payer: Medicaid Other | Source: Ambulatory Visit | Attending: Interventional Radiology | Admitting: Interventional Radiology

## 2017-11-14 ENCOUNTER — Telehealth (HOSPITAL_COMMUNITY): Payer: Self-pay

## 2017-11-14 ENCOUNTER — Encounter (HOSPITAL_COMMUNITY): Payer: Self-pay

## 2017-11-14 DIAGNOSIS — J449 Chronic obstructive pulmonary disease, unspecified: Secondary | ICD-10-CM | POA: Diagnosis not present

## 2017-11-14 DIAGNOSIS — Z880 Allergy status to penicillin: Secondary | ICD-10-CM | POA: Diagnosis not present

## 2017-11-14 DIAGNOSIS — I10 Essential (primary) hypertension: Secondary | ICD-10-CM | POA: Diagnosis not present

## 2017-11-14 DIAGNOSIS — IMO0002 Reserved for concepts with insufficient information to code with codable children: Secondary | ICD-10-CM

## 2017-11-14 DIAGNOSIS — Z8673 Personal history of transient ischemic attack (TIA), and cerebral infarction without residual deficits: Secondary | ICD-10-CM | POA: Diagnosis not present

## 2017-11-14 DIAGNOSIS — C3432 Malignant neoplasm of lower lobe, left bronchus or lung: Secondary | ICD-10-CM | POA: Insufficient documentation

## 2017-11-14 DIAGNOSIS — F17210 Nicotine dependence, cigarettes, uncomplicated: Secondary | ICD-10-CM | POA: Diagnosis not present

## 2017-11-14 DIAGNOSIS — Z9889 Other specified postprocedural states: Secondary | ICD-10-CM | POA: Insufficient documentation

## 2017-11-14 DIAGNOSIS — R229 Localized swelling, mass and lump, unspecified: Principal | ICD-10-CM

## 2017-11-14 DIAGNOSIS — E119 Type 2 diabetes mellitus without complications: Secondary | ICD-10-CM | POA: Diagnosis not present

## 2017-11-14 DIAGNOSIS — C50919 Malignant neoplasm of unspecified site of unspecified female breast: Secondary | ICD-10-CM | POA: Diagnosis not present

## 2017-11-14 DIAGNOSIS — E785 Hyperlipidemia, unspecified: Secondary | ICD-10-CM | POA: Diagnosis not present

## 2017-11-14 DIAGNOSIS — Z853 Personal history of malignant neoplasm of breast: Secondary | ICD-10-CM | POA: Insufficient documentation

## 2017-11-14 DIAGNOSIS — R5383 Other fatigue: Secondary | ICD-10-CM | POA: Diagnosis not present

## 2017-11-14 DIAGNOSIS — Z79899 Other long term (current) drug therapy: Secondary | ICD-10-CM | POA: Diagnosis not present

## 2017-11-14 DIAGNOSIS — F17200 Nicotine dependence, unspecified, uncomplicated: Secondary | ICD-10-CM | POA: Insufficient documentation

## 2017-11-14 DIAGNOSIS — Z9013 Acquired absence of bilateral breasts and nipples: Secondary | ICD-10-CM | POA: Diagnosis not present

## 2017-11-14 DIAGNOSIS — M79602 Pain in left arm: Secondary | ICD-10-CM | POA: Insufficient documentation

## 2017-11-14 DIAGNOSIS — Z7982 Long term (current) use of aspirin: Secondary | ICD-10-CM | POA: Diagnosis not present

## 2017-11-14 DIAGNOSIS — R634 Abnormal weight loss: Secondary | ICD-10-CM | POA: Insufficient documentation

## 2017-11-14 DIAGNOSIS — Z885 Allergy status to narcotic agent status: Secondary | ICD-10-CM | POA: Diagnosis not present

## 2017-11-14 LAB — CBC
HEMATOCRIT: 27.9 % — AB (ref 36.0–46.0)
HEMOGLOBIN: 8.2 g/dL — AB (ref 12.0–15.0)
MCH: 23.2 pg — ABNORMAL LOW (ref 26.0–34.0)
MCHC: 29.4 g/dL — AB (ref 30.0–36.0)
MCV: 79 fL (ref 78.0–100.0)
Platelets: 487 10*3/uL — ABNORMAL HIGH (ref 150–400)
RBC: 3.53 MIL/uL — ABNORMAL LOW (ref 3.87–5.11)
RDW: 21.7 % — AB (ref 11.5–15.5)
WBC: 12.6 10*3/uL — ABNORMAL HIGH (ref 4.0–10.5)

## 2017-11-14 LAB — PROTIME-INR
INR: 1.11
Prothrombin Time: 14.2 seconds (ref 11.4–15.2)

## 2017-11-14 MED ORDER — FENTANYL CITRATE (PF) 100 MCG/2ML IJ SOLN
INTRAMUSCULAR | Status: AC | PRN
  Administered 2017-11-14: 50 ug via INTRAVENOUS

## 2017-11-14 MED ORDER — LIDOCAINE HCL 1 % IJ SOLN
INTRAMUSCULAR | Status: AC
  Filled 2017-11-14: qty 20

## 2017-11-14 MED ORDER — MIDAZOLAM HCL 2 MG/2ML IJ SOLN
INTRAMUSCULAR | Status: AC | PRN
  Administered 2017-11-14: 1 mg via INTRAVENOUS

## 2017-11-14 MED ORDER — MIDAZOLAM HCL 2 MG/2ML IJ SOLN
INTRAMUSCULAR | Status: AC
  Filled 2017-11-14: qty 4

## 2017-11-14 MED ORDER — FENTANYL CITRATE (PF) 100 MCG/2ML IJ SOLN
INTRAMUSCULAR | Status: AC
  Filled 2017-11-14: qty 4

## 2017-11-14 MED ORDER — SODIUM CHLORIDE 0.9 % IV SOLN: INTRAVENOUS | Status: DC

## 2017-11-14 NOTE — Discharge Instructions (Addendum)
Lung Biopsy, Care After °This sheet gives you information about how to care for yourself after your procedure. Your health care provider may also give you more specific instructions depending on the type of biopsy you had. If you have problems or questions, contact your health care provider. °What can I expect after the procedure? °After the procedure, it is common to have: °· A cough. °· A sore throat. °· Pain where a needle, bronchoscope, or incision was used to collect a biopsy sample (biopsy site). ° °Follow these instructions at home: °Medicines °· Take over-the-counter and prescription medicines only as told by your health care provider. °· Do not drive for 24 hours if you were given a sedative. °· Do not drink alcohol while taking pain medicine. °· Do not drive or use heavy machinery while taking prescription pain medicine. °· To prevent or treat constipation while you are taking prescription pain medicine, your health care provider may recommend that you: °? Drink enough fluid to keep your urine clear or pale yellow. °? Take over-the-counter or prescription medicines. °? Eat foods that are high in fiber, such as fresh fruits and vegetables, whole grains, and beans. °? Limit foods that are high in fat and processed sugars, such as fried and sweet foods. °Activity °· If you had an incision during your procedure, avoid activities that may pull the incision site open. °· Return to your normal activities as told by your health care provider. Ask your health care provider what activities are safe for you. °If you had an open biopsy:  °· Follow instructions from your health care provider about how to take care of your incision. Make sure you: °? Wash your hands with soap and water before you change your bandage (dressing). If soap and water are not available, use hand sanitizer. °? Change your dressing as told by your health care provider. °? Leave stitches (sutures), skin glue, or adhesive strips in place. These  skin closures may need to stay in place for 2 weeks or longer. If adhesive strip edges start to loosen and curl up, you may trim the loose edges. Do not remove adhesive strips completely unless your health care provider tells you to do that. °· Check your incision area every day for signs of infection. Check for: °? Redness, swelling, or pain. °? Fluid or blood. °? Warmth. °? Pus or a bad smell. °General instructions °· It is up to you to get the results of your procedure. Ask your health care provider, or the department that is doing the procedure, when your results will be ready. °Contact a health care provider if: °· You have a fever. °· You have redness, swelling, or pain around your biopsy site. °· You have fluid or blood coming from your biopsy site. °· Your biopsy site feels warm to the touch. °· You have pus or a bad smell coming from your biopsy site. °Get help right away if: °· You cough up blood. °· You have trouble breathing. °· You have chest pain. °Summary °· After the procedure, it is common to have a sore throat and a cough. °· Return to your normal activities as told by your health care provider. Ask your health care provider what activities are safe for you. °· Take over-the-counter and prescription medicines only as told by your health care provider. °· Report any unusual symptoms to your health care provider. °This information is not intended to replace advice given to you by your health care provider. Make sure   you discuss any questions you have with your health care provider. Document Released: 03/08/2016 Document Revised: 03/08/2016 Document Reviewed: 03/08/2016 Elsevier Interactive Patient Education  2018 [place]. Moderate Conscious Sedation, Adult, Care After These instructions provide you with information about caring for yourself after your procedure. Your health care provider may also give you more specific instructions. Your treatment has been planned according to current  medical practices, but problems sometimes occur. Call your health care provider if you have any problems or questions after your procedure. What can I expect after the procedure? After your procedure, it is common:  To feel sleepy for several hours.  To feel clumsy and have poor balance for several hours.  To have poor judgment for several hours.  To vomit if you eat too soon.  Follow these instructions at home: For at least 24 hours after the procedure:   Do not: ? Participate in activities where you could fall or become injured. ? Drive. ? Use heavy machinery. ? Drink alcohol. ? Take sleeping pills or medicines that cause drowsiness. ? Make important decisions or sign legal documents. ? Take care of children on your own.  Rest. Eating and drinking  Follow the diet recommended by your health care provider.  If you vomit: ? Drink water, juice, or soup when you can drink without vomiting. ? Make sure you have little or no nausea before eating solid foods. General instructions  Have a responsible adult stay with you until you are awake and alert.  Take over-the-counter and prescription medicines only as told by your health care provider.  If you smoke, do not smoke without supervision.  Keep all follow-up visits as told by your health care provider. This is important. Contact a health care provider if:  You keep feeling nauseous or you keep vomiting.  You feel light-headed.  You develop a rash.  You have a fever. Get help right away if:  You have trouble breathing. This information is not intended to replace advice given to you by your health care provider. Make sure you discuss any questions you have with your health care provider. Document Released: 11/28/2012 Document Revised: 07/13/2015 Document Reviewed: 05/30/2015 Elsevier Interactive Patient Education  [employer].

## 2017-11-14 NOTE — H&P (Signed)
Chief Complaint: Patient was seen in consultation today for left arm soft tissue lesion biopsy vs renal mass or left lung mass biopsy at the request of Higgs,Vetta  Referring Physician(s): Higgs,Vetta  Supervising Physician: Jacqulynn Cadet  Patient Status: [facility] - Out-pt  History of Present Illness: [name] is a 57 y.o. female   +smoker Initial diagnosed Breast Ca 2009 Chemo and Bilat mastectomies  Left Arm pain severe Wt loss Fatigue; "feels bad"- for weeks Dr Delton Coombes ordered imaging  CT 10/25/2017: IMPRESSION: 6.9 cm centrally necrotic left lower lobe mass, abutting the pleura/left chest wall and left fissure, suspicious for primary bronchogenic neoplasm. Necrotic 14 mm left infrahilar node, suspicious for nodal metastasis. Multiple hypoenhancing renal lesion, suggesting pyelonephritis, with possible early renal abscesses measuring up to 3.5 cm on the left. Technically metastases or lymphoma could have a similar appearance, although this is considered unlikely given the clinical symptoms. Follow-up CT or MRI abdomen with/without contrast is suggested in 3-4 weeks after appropriate antimicrobial therapy to ensure resolution.  PET 11/03/17: IMPRESSION: 1. Left lower lobe necrotic lung mass is identified and is intensely hypermetabolic compatible with primary bronchogenic carcinoma. Correlation with tissue sampling is advised. 2. Hypermetabolic left hilar lymph nodes compatible with metastatic adenopathy. 3. Bilateral kidney masses are identified which appear low-attenuation and exhibit increased FDG uptake. In the absence of signs or symptoms of urinary tract infection findings are highly concerning for bilateral kidney metastasis. 4. Hypermetabolic retroperitoneal lymph node worrisome for metastatic adenopathy. 5. Nonspecific focus of intense uptake localizing to the posterior wall the proximal stomach. Although this may represent an area  of physiologic activity, primary or metastatic lesion cannot be excluded. 6. Focal area of intense uptake identified within the soft tissues of the left upper arm with corresponding 3.3 cm soft tissue nodule. Suspicious for metastatic disease. 7. Mild FDG uptake is associated with small right level 2 cervical lymph nodes. Nonspecific. 8.  Aortic Atherosclerosis (ICD10-I70.0).  Request for tissue diagnosis Scheduled today for left arm mass biopsy -- if not able to visualize well enough to safely biopsy-- will biopsy renal or lung mass   Past Medical History:  Diagnosis Date  . Asthma   . Back muscle spasm 12/13/2010  . Cancer [facility])    breast cancer, remission 2010  . Chronic back pain   . Chronic foot pain   . COPD (chronic obstructive pulmonary disease) ([place]) 09/14/2010  . Diabetes mellitus without complication ([place])   . H/O MRSA infection 09/14/2010  . High blood cholesterol   . Hyperlipidemia 12/07/2012  . Hypertension   . Lung mass   . 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 Memory loss   . Psoriasis 12/13/2010  . Stroke ([place])   . Tobacco abuse 12/07/2012    Past Surgical History:  Procedure Laterality Date  . ABDOMINAL HYSTERECTOMY    . CESAREAN SECTION     x7  . IR ANGIO INTRA EXTRACRAN SEL COM CAROTID INNOMINATE BILAT MOD SED  10/10/2016  . IR ANGIO VERTEBRAL SEL SUBCLAVIAN INNOMINATE UNI R MOD SED  10/10/2016  . IR ANGIOGRAM EXTREMITY LEFT  10/10/2016  . MASTECTOMY     left  . MASTECTOMY Right 2009   prophylactic  . PORT-A-CATH REMOVAL  06/01/2011   Procedure: REMOVAL PORT-A-CATH;  Surgeon: Donato Heinz, MD;  Location: [facility];  Service: General;  Laterality: N/A;  Minor Room  . portacath insertion    . RADIOLOGY WITH ANESTHESIA N/A 01/23/2017   Procedure: ANGIOPLASTY WITH STENTING;  Surgeon: Luanne Bras, MD;  Location: [place];  Service: Radiology;  Laterality: N/A;    Allergies: Codeine and Penicillins  Medications: Prior to Admission medications    Medication Sig Start Date End Date Taking? Authorizing Provider  ALPRAZolam Duanne Moron) 1 MG tablet Take 1 tablet (1 mg total) by mouth 2 (two) times daily as needed. for anxiety 10/14/16   Angiulli, Lavon Paganini, PA-C  amLODipine (NORVASC) 5 MG tablet Take 5 mg by mouth daily.    [provider]  aspirin 325 MG tablet Take 1 tablet (325 mg total) by mouth daily. 10/12/16   Velvet Bathe, MD  atorvastatin (LIPITOR) 10 MG tablet Take 1 tablet (10 mg total) by mouth daily at 6 PM. 10/14/16   Angiulli, Lavon Paganini, PA-C  cetirizine (ZYRTEC) 10 MG tablet Take 10 mg by mouth at bedtime.    [provider]  diclofenac sodium (VOLTAREN) 1 % GEL Apply 1 application topically daily as needed (pain).    [provider]  famotidine (PEPCID) 20 MG tablet Take 20 mg by mouth 2 (two) times daily.    [provider]  gabapentin (NEURONTIN) 400 MG capsule Take 800 mg by mouth 3 (three) times daily.     [provider]  losartan-hydrochlorothiazide (HYZAAR) 100-12.5 MG tablet Take 1 tablet by mouth daily.    [provider]  montelukast (SINGULAIR) 10 MG tablet Take 10 mg by mouth at bedtime.    [provider]  Olopatadine HCl (PATADAY) 0.2 % SOLN Place 1 drop into both eyes 4 (four) times daily as needed (eye pain).     [provider]  oxyCODONE (ROXICODONE) 15 MG immediate release tablet Take 1 tablet (15 mg total) by mouth every 4 (four) hours as needed for pain. 10/27/17   Isaac Bliss, Rayford Halsted, MD  ticagrelor (BRILINTA) 90 MG TABS tablet Take 90 mg by mouth 2 (two) times daily.    [provider]     Family History  Problem Relation Age of Onset  . Stroke Mother   . Stroke Father   . Cancer Maternal Aunt   . Cancer Maternal Grandmother   . Cancer Maternal Aunt   . Cancer Maternal Aunt     Social History   Socioeconomic History  . Marital status: Legally Separated    Spouse name: Not on file  . Number of children: Not on file   . Years of education: Not on file  . Highest education level: Not on file  Occupational History  . Not on file  Social Needs  . Financial resource strain: Not on file  . Food insecurity:    Worry: Not on file    Inability: Not on file  . Transportation needs:    Medical: Not on file    Non-medical: Not on file  Tobacco Use  . Smoking status: Current Every Day Smoker    Packs/day: 0.25    Years: 36.00    Pack years: 9.00  . Smokeless tobacco: Never Used  . Tobacco comment: 3 cigarettes a day  Substance and Sexual Activity  . Alcohol use: Not Currently    Comment: jack and coke social event  . Drug use: Not Currently    Types: Marijuana    Comment: in the past in the year 48  . Sexual activity: Not on file  Lifestyle  . Physical activity:    Days per week: Not on file    Minutes per session: Not on file  . Stress: Not  on file  Relationships  . Social connections:    Talks on phone: Not on file    Gets together: Not on file    Attends religious service: Not on file    Active member of club or organization: Not on file    Attends meetings of clubs or organizations: Not on file    Relationship status: Not on file  Other Topics Concern  . Not on file  Social History Narrative  . Not on file    Review of Systems: A 12 point ROS discussed and pertinent positives are indicated in the HPI above.  All other systems are negative.  Review of Systems  Constitutional: Positive for activity change, appetite change, fatigue and unexpected weight change.  Respiratory: Negative for cough and shortness of breath.   Cardiovascular: Positive for leg swelling. Negative for chest pain.  Gastrointestinal: Positive for abdominal pain and nausea.  Musculoskeletal: Positive for back pain.  Neurological: Positive for weakness.  Psychiatric/Behavioral: Negative for behavioral problems and confusion.    Vital Signs: There were no vitals taken for this visit.  Physical Exam   Cardiovascular: Normal rate and regular rhythm.  Pulmonary/Chest: Effort normal. She has wheezes.  Abdominal: Soft. Bowel sounds are normal.  Musculoskeletal: Normal range of motion. She exhibits edema and tenderness.  Severe left arm pain  Neurological: She is alert.  Skin: Skin is warm and dry.  Psychiatric: She has a normal mood and affect. Her behavior is normal. Judgment and thought content normal.  Vitals reviewed.   Imaging: Dg Chest 2 View  Result Date: 10/25/2017 CLINICAL DATA:  Cough, fever EXAM: CHEST - 2 VIEW COMPARISON:  None. FINDINGS: There is a large rounded opacity in the left lower lobe. There is no other focal parenchymal opacity. There is no pleural effusion or pneumothorax. The heart and mediastinal contours are unremarkable. The osseous structures are unremarkable. IMPRESSION: Large rounded opacity in the left lower lobe. This may reflect pneumonia versus a pulmonary mass. Recommend further evaluation with a CT of the chest with intravenous contrast. Electronically Signed   By: Kathreen Devoid   On: 10/25/2017 09:46   Ct Chest W Contrast  Result Date: 10/25/2017 CLINICAL DATA:  Cough, fever, left lower lobe opacity on chest radiograph. Left flank pain, fever. EXAM: CT CHEST, ABDOMEN, AND PELVIS WITH CONTRAST TECHNIQUE: Multidetector CT imaging of the chest, abdomen and pelvis was performed following the standard protocol during bolus administration of intravenous contrast. CONTRAST:  17mL OMNIPAQUE IOHEXOL 300 MG/ML  SOLN COMPARISON:  Chest radiographs dated 10/25/2017. CT chest abdomen pelvis dated 10/05/2012. FINDINGS: CT CHEST FINDINGS Cardiovascular: Heart is normal in size.  No pericardial effusion. No evidence of thoracic aortic aneurysm. Atherosclerotic calcifications of the aortic arch. 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 No suspicious mediastinal lymphadenopathy. 14 mm short axis low-density left infrahilar node (series 2/image  78), suspicious. Visualized thyroid is unremarkable. Mediastinum/Nodes: No suspicious mediastinal lymphadenopathy. Necrotic 14 mm short axis left infrahilar node (series 2/image 78), suspicious. Visualized thyroid is unremarkable. Lungs/Pleura: 6.9 x 5.2 x 6.8 cm centrally necrotic left lower lobe mass, abutting the pleura/left chest wall, highly suspicious for primary bronchogenic neoplasm. Mass abuts the left fissure. Mild biapical pleural-parenchymal scarring. No focal consolidation. No pleural effusion or pneumothorax. Musculoskeletal: Mild degenerative changes of the mid thoracic spine. CT ABDOMEN PELVIS FINDINGS Hepatobiliary: Liver is within normal limits, noting focal fat/altered perfusion along the falciform ligament (series 15/image 23). Gallbladder is underdistended. No intrahepatic or extrahepatic  ductal dilatation. Pancreas: Within normal limits. Spleen: Cystic lesions in the spleen measuring up to 2.2 cm, likely benign. Adrenals/Urinary Tract: Adrenal glands are within normal limits. Multiple hypoenhancing renal lesions, suggesting pyelonephritis, with possible early renal abscesses measuring up to 3.5 cm in the left upper kidney (series 15/image 29) and 3.1 cm in the right upper kidney (series 15/image 35). No hydronephrosis. Bladder is within normal limits. Stomach/Bowel: Stomach is within normal limits. No evidence of bowel obstruction. Appendix is not discretely visualized. Vascular/Lymphatic: No evidence of abdominal aortic aneurysm. Atherosclerotic calcifications of the abdominal aorta and branch vessels. No suspicious abdominopelvic lymphadenopathy. Reproductive: Status post hysterectomy. No adnexal masses. Other: No abdominopelvic ascites. Musculoskeletal: Mild degenerative changes at L5-S1. IMPRESSION: 6.9 cm centrally necrotic left lower lobe mass, abutting the pleura/left chest wall and left fissure, suspicious for primary bronchogenic neoplasm. Necrotic 14 mm left infrahilar node,  suspicious for nodal metastasis. Multiple hypoenhancing renal lesion, suggesting pyelonephritis, with possible early renal abscesses measuring up to 3.5 cm on the left. Technically metastases or lymphoma could have a similar appearance, although this is considered unlikely given the clinical symptoms. Follow-up CT or MRI abdomen with/without contrast is suggested in 3-4 weeks after appropriate antimicrobial therapy to ensure resolution. 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 Electronically Signed   By: Julian Hy M.D.   On: 10/25/2017 13:16   Ct Abdomen Pelvis W Contrast  Result Date: 10/25/2017 CLINICAL DATA:  Cough, fever, left lower lobe opacity on chest radiograph. Left flank pain, fever. EXAM: CT CHEST, ABDOMEN, AND PELVIS WITH CONTRAST TECHNIQUE: Multidetector CT imaging of the chest, abdomen and pelvis was performed following the standard protocol during bolus administration of intravenous contrast. CONTRAST:  55mL OMNIPAQUE IOHEXOL 300 MG/ML  SOLN COMPARISON:  Chest radiographs dated 10/25/2017. CT chest abdomen pelvis dated 10/05/2012. FINDINGS: CT CHEST FINDINGS Cardiovascular: Heart is normal in size.  No pericardial effusion. No evidence of thoracic aortic aneurysm. Atherosclerotic calcifications of the aortic arch. 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 No suspicious mediastinal lymphadenopathy. 14 mm short axis low-density left infrahilar node (series 2/image 78), suspicious. Visualized thyroid is unremarkable. Mediastinum/Nodes: No suspicious mediastinal lymphadenopathy. Necrotic 14 mm short axis left infrahilar node (series 2/image 78), suspicious. Visualized thyroid is unremarkable. Lungs/Pleura: 6.9 x 5.2 x 6.8 cm centrally necrotic left lower lobe mass, abutting the pleura/left chest wall, highly suspicious for primary bronchogenic neoplasm. Mass abuts the left fissure. Mild biapical pleural-parenchymal scarring. No focal  consolidation. No pleural effusion or pneumothorax. Musculoskeletal: Mild degenerative changes of the mid thoracic spine. CT ABDOMEN PELVIS FINDINGS Hepatobiliary: Liver is within normal limits, noting focal fat/altered perfusion along the falciform ligament (series 15/image 23). Gallbladder is underdistended. No intrahepatic or extrahepatic ductal dilatation. Pancreas: Within normal limits. Spleen: Cystic lesions in the spleen measuring up to 2.2 cm, likely benign. Adrenals/Urinary Tract: Adrenal glands are within normal limits. Multiple hypoenhancing renal lesions, suggesting pyelonephritis, with possible early renal abscesses measuring up to 3.5 cm in the left upper kidney (series 15/image 29) and 3.1 cm in the right upper kidney (series 15/image 35). No hydronephrosis. Bladder is within normal limits. Stomach/Bowel: Stomach is within normal limits. No evidence of bowel obstruction. Appendix is not discretely visualized. Vascular/Lymphatic: No evidence of abdominal aortic aneurysm. Atherosclerotic calcifications of the abdominal aorta and branch vessels. No suspicious abdominopelvic lymphadenopathy. Reproductive: Status post hysterectomy. No adnexal masses. Other: No abdominopelvic ascites. Musculoskeletal: Mild degenerative changes at L5-S1. IMPRESSION: 6.9 cm centrally necrotic left lower  lobe mass, abutting the pleura/left chest wall and left fissure, suspicious for primary bronchogenic neoplasm. Necrotic 14 mm left infrahilar node, suspicious for nodal metastasis. Multiple hypoenhancing renal lesion, suggesting pyelonephritis, with possible early renal abscesses measuring up to 3.5 cm on the left. Technically metastases or lymphoma could have a similar appearance, although this is considered unlikely given the clinical symptoms. Follow-up CT or MRI abdomen with/without contrast is suggested in 3-4 weeks after appropriate antimicrobial therapy to ensure resolution. Occlusion of the proximal left subclavian  artery at the origin. Electronically Signed   By: Julian Hy M.D.   On: 10/25/2017 13:16   Nm Pet Image Initial (pi) Skull Base To Thigh  Result Date: 11/03/2017 CLINICAL DATA:  Initial treatment strategy for lung mass. EXAM: NUCLEAR MEDICINE PET SKULL BASE TO THIGH TECHNIQUE: 8.4 mCi F-18 FDG was injected intravenously. Full-ring PET imaging was performed from the skull base to thigh after the radiotracer. CT data was obtained and used for attenuation correction and anatomic localization. Fasting blood glucose: 106 mg/dl COMPARISON:  10/25/2017 FINDINGS: Mediastinal blood pool activity: SUV max 1.91 NECK: Mild to moderate FDG uptake identified within right level 2 lymph nodes within SUV max of 4.99. Incidental CT findings: none CHEST: No hypermetabolic supraclavicular or axillary lymph nodes. No right hilar, subcarinal or mediastinal hypermetabolic lymph nodes. FDG uptake within the left hilum has an SUV max of 9.56. There is a solid centrally necrotic mass within the left lower lobe of 6.8 cm and has an SUV max of 18.6. There is a small nodule within the right upper lobe which is nonspecific and too small to characterize by PET-CT measuring 5 mm. Incidental CT findings: Aortic atherosclerosis noted. RCA coronary artery atherosclerotic calcifications identified. ABDOMEN/PELVIS: There is no abnormal radiotracer activity identified within the liver, pancreas, or spleen. Hypermetabolic low-density kidney masses are identified. There is intense FDG uptake associated with these lesions. Some of these lesions appear centrally necrotic. Arising from the upper pole of right kidney the largest measures 3.9 cm within SUV max of 14.8. Hypermetabolic right retroperitoneal node measures 1.2 cm and has an SUV max 10.68. Along the posterior wall of the proximal stomach there is a focal area of increased radiotracer uptake which is nonspecific, SUV max equals 11.29. Incidental CT findings: Aortic atherosclerosis  identified. No aneurysm. SKELETON: There is a focal area of increased uptake within the left upper arm which has an SUV max equal to 6.87. On the CT images there appears to be a corresponding soft tissue lesion adjacent to the musculature measuring 2.3 cm. No osseous lesions identified. Incidental CT findings: none IMPRESSION: 1. Left lower lobe necrotic lung mass is identified and is intensely hypermetabolic compatible with primary bronchogenic carcinoma. Correlation with tissue sampling is advised. 2. Hypermetabolic left hilar lymph nodes compatible with metastatic adenopathy. 3. Bilateral kidney masses are identified which appear low-attenuation and exhibit increased FDG uptake. In the absence of signs or symptoms of urinary tract infection findings are highly concerning for bilateral kidney metastasis. 4. Hypermetabolic retroperitoneal lymph node worrisome for metastatic adenopathy. 5. Nonspecific focus of intense uptake localizing to the posterior wall the proximal stomach. Although this may represent an area of physiologic activity, primary or metastatic lesion cannot be excluded. 6. Focal area of intense uptake identified within the soft tissues of the left upper arm with corresponding 3.3 cm soft tissue nodule. Suspicious for metastatic disease. 7. Mild FDG uptake is associated with small right level 2 cervical lymph nodes. Nonspecific. 8.  Aortic Atherosclerosis (ICD10-I70.0).  Electronically Signed   By: Kerby Moors M.D.   On: 11/03/2017 11:09   US Venous Img Lower Bilateral  Result Date: 11/06/2017 CLINICAL DATA:  Bilateral lower extremity pain and swelling. EXAM: BILATERAL LOWER EXTREMITY VENOUS DOPPLER ULTRASOUND TECHNIQUE: Gray-scale sonography with graded compression, as well as color Doppler and duplex ultrasound were performed to evaluate the lower extremity deep venous systems from the level of the common femoral vein and including the common femoral, femoral, profunda femoral, popliteal and  calf veins including the posterior tibial, peroneal and gastrocnemius veins when visible. The superficial great saphenous vein was also interrogated. Spectral Doppler was utilized to evaluate flow at rest and with distal augmentation maneuvers in the common femoral, femoral and popliteal veins. COMPARISON:  None. FINDINGS: RIGHT LOWER EXTREMITY Common Femoral Vein: No evidence of thrombus. Normal compressibility, respiratory phasicity and response to augmentation. Saphenofemoral Junction: No evidence of thrombus. Normal compressibility and flow on color Doppler imaging. Profunda Femoral Vein: No evidence of thrombus. Normal compressibility and flow on color Doppler imaging. Femoral Vein: No evidence of thrombus. Normal compressibility, respiratory phasicity and response to augmentation. Popliteal Vein: No evidence of thrombus. Normal compressibility, respiratory phasicity and response to augmentation. Calf Veins: No evidence of thrombus. Normal compressibility and flow on color Doppler imaging. Superficial Great Saphenous Vein: No evidence of thrombus. Normal compressibility. Venous Reflux:  None. Other Findings: Prominent right inguinal lymph node is likely reactive. LEFT LOWER EXTREMITY Common Femoral Vein: No evidence of thrombus. Normal compressibility, respiratory phasicity and response to augmentation. Saphenofemoral Junction: No evidence of thrombus. Normal compressibility and flow on color Doppler imaging. Profunda Femoral Vein: No evidence of thrombus. Normal compressibility and flow on color Doppler imaging. Femoral Vein: No evidence of thrombus. Normal compressibility, respiratory phasicity and response to augmentation. Popliteal Vein: No evidence of thrombus. Normal compressibility, respiratory phasicity and response to augmentation. Calf Veins: No evidence of thrombus. Normal compressibility and flow on color Doppler imaging. Superficial Great Saphenous Vein: No evidence of thrombus. Normal  compressibility. Venous Reflux:  None. Other Findings:  None. IMPRESSION: No evidence of deep venous thrombosis in the lower extremities. Electronically Signed   By: Markus Daft M.D.   On: 11/06/2017 13:45    Labs:  CBC: Recent Labs    10/25/17 1851 10/26/17 0524 10/27/17 0512 11/06/17 1216  WBC 10.6* 10.0 10.0 11.9*  HGB 8.9* 7.4* 7.2* 7.9*  HCT 29.2* 25.5* 24.9* 26.0*  PLT 596* 561* 513* 625*    COAGS: Recent Labs    12/06/16 1217  INR 0.99  APTT 27    BMP: Recent Labs    10/25/17 1851 10/26/17 0524 10/27/17 0512 11/06/17 1216  NA 135 137 139 135  K 3.4* 3.9 4.3 3.6  CL 95* 101 105 94*  CO2 30 28 29 31   GLUCOSE 115* 123* 111* 100*  BUN 16 13 12 15   CALCIUM 9.1 8.6* 8.4* 9.4  CREATININE 1.63* 1.36* 1.21* 1.43*  GFRNONAA 34* 43* 49* 40*  GFRAA 40* 49* 57* 46*    LIVER FUNCTION TESTS: Recent Labs    11/06/17 1216  BILITOT 0.5  AST 9*  ALT 7  ALKPHOS 81  PROT 8.0  ALBUMIN 2.9*    TUMOR MARKERS: No results for input(s): AFPTM, CEA, CA199, CHROMGRNA in the last 8760 hours.  Assessment and Plan:  Hx  Breast Ca CT with multiple lesions noted ++PET Scheduled for left arm mass biopsy Vs renal or lung mass biopsy Risks and benefits discussed with the patient including, but not limited  to bleeding, infection, damage to adjacent structures or low yield requiring additional tests. All of the patient's questions were answered, patient is agreeable to proceed. Consent signed and in chart.  Risks and benefits discussed with the patient including, but not limited to bleeding, hemoptysis, respiratory failure requiring intubation, infection, pneumothorax requiring chest tube placement, stroke from air embolism or even death. All of the patient's questions were answered, patient is agreeable to proceed. Consent signed and in chart.  Thank you for this interesting consult.  I greatly enjoyed meeting Everlene Cunning [name] and look forward to participating in their  care.  A copy of this report was sent to the requesting provider on this date.  Electronically Signed: Lavonia Drafts, PA-C 11/14/2017, 12:34 PM   I spent a total of  30 Minutes   in face to face in clinical consultation, greater than 50% of which was counseling/coordinating care for left arm mass bx Vs renal or lung mass bx

## 2017-11-14 NOTE — Procedures (Signed)
Interventional Radiology Procedure Note  Procedure: CT guided biopsy of LLL pulmonary mass.  Complications: No immediate Recommendations: - Bedrest until CXR cleared.  Minimize talking, coughing or otherwise straining.  - Follow up 1 hr CXR pending   Signed,  Criselda Peaches, MD

## 2017-11-14 NOTE — Discharge Instructions (Signed)
Lung Biopsy, Care After °This sheet gives you information about how to care for yourself after your procedure. Your health care provider may also give you more specific instructions depending on the type of biopsy you had. If you have problems or questions, contact your health care provider. °What can I expect after the procedure? °After the procedure, it is common to have: °· A cough. °· A sore throat. °· Pain where a needle, bronchoscope, or incision was used to collect a biopsy sample (biopsy site). ° °Follow these instructions at home: °Medicines °· Take over-the-counter and prescription medicines only as told by your health care provider. °· Do not drive for 24 hours if you were given a sedative. °· Do not drink alcohol while taking pain medicine. °· Do not drive or use heavy machinery while taking prescription pain medicine. °· To prevent or treat constipation while you are taking prescription pain medicine, your health care provider may recommend that you: °? Drink enough fluid to keep your urine clear or pale yellow. °? Take over-the-counter or prescription medicines. °? Eat foods that are high in fiber, such as fresh fruits and vegetables, whole grains, and beans. °? Limit foods that are high in fat and processed sugars, such as fried and sweet foods. °Activity °· If you had an incision during your procedure, avoid activities that may pull the incision site open. °· Return to your normal activities as told by your health care provider. Ask your health care provider what activities are safe for you. °If you had an open biopsy:  °· Follow instructions from your health care provider about how to take care of your incision. Make sure you: °? Wash your hands with soap and water before you change your bandage (dressing). If soap and water are not available, use hand sanitizer. °? Change your dressing as told by your health care provider. °? Leave stitches (sutures), skin glue, or adhesive strips in place. These  skin closures may need to stay in place for 2 weeks or longer. If adhesive strip edges start to loosen and curl up, you may trim the loose edges. Do not remove adhesive strips completely unless your health care provider tells you to do that. °· Check your incision area every day for signs of infection. Check for: °? Redness, swelling, or pain. °? Fluid or blood. °? Warmth. °? Pus or a bad smell. °General instructions °· It is up to you to get the results of your procedure. Ask your health care provider, or the department that is doing the procedure, when your results will be ready. °Contact a health care provider if: °· You have a fever. °· You have redness, swelling, or pain around your biopsy site. °· You have fluid or blood coming from your biopsy site. °· Your biopsy site feels warm to the touch. °· You have pus or a bad smell coming from your biopsy site. °Get help right away if: °· You cough up blood. °· You have trouble breathing. °· You have chest pain. °Summary °· After the procedure, it is common to have a sore throat and a cough. °· Return to your normal activities as told by your health care provider. Ask your health care provider what activities are safe for you. °· Take over-the-counter and prescription medicines only as told by your health care provider. °· Report any unusual symptoms to your health care provider. °This information is not intended to replace advice given to you by your health care provider. Make sure   you discuss any questions you have with your health care provider. °Document Released: 03/08/2016 Document Revised: 03/08/2016 Document Reviewed: 03/08/2016 °[employer] Interactive Patient Education © 2018 [employer] ° °

## 2017-11-16 ENCOUNTER — Encounter (HOSPITAL_COMMUNITY): Payer: Self-pay | Admitting: Emergency Medicine

## 2017-11-16 ENCOUNTER — Emergency Department (HOSPITAL_COMMUNITY): Payer: Medicaid Other

## 2017-11-16 ENCOUNTER — Inpatient Hospital Stay (HOSPITAL_COMMUNITY)
Admission: EM | Admit: 2017-11-16 | Discharge: 2017-11-22 | DRG: 690 | Disposition: A | Discharge status: Hospice | Payer: Medicaid Other | Attending: Internal Medicine | Admitting: Internal Medicine

## 2017-11-16 ENCOUNTER — Other Ambulatory Visit: Payer: Self-pay

## 2017-11-16 ENCOUNTER — Telehealth (HOSPITAL_COMMUNITY): Payer: Self-pay

## 2017-11-16 DIAGNOSIS — Z8673 Personal history of transient ischemic attack (TIA), and cerebral infarction without residual deficits: Secondary | ICD-10-CM

## 2017-11-16 DIAGNOSIS — Z9013 Acquired absence of bilateral breasts and nipples: Secondary | ICD-10-CM

## 2017-11-16 DIAGNOSIS — D649 Anemia, unspecified: Secondary | ICD-10-CM | POA: Diagnosis present

## 2017-11-16 DIAGNOSIS — Z7189 Other specified counseling: Secondary | ICD-10-CM

## 2017-11-16 DIAGNOSIS — N3091 Cystitis, unspecified with hematuria: Secondary | ICD-10-CM | POA: Diagnosis present

## 2017-11-16 DIAGNOSIS — D62 Acute posthemorrhagic anemia: Secondary | ICD-10-CM | POA: Diagnosis present

## 2017-11-16 DIAGNOSIS — Z955 Presence of coronary angioplasty implant and graft: Secondary | ICD-10-CM

## 2017-11-16 DIAGNOSIS — G894 Chronic pain syndrome: Secondary | ICD-10-CM | POA: Diagnosis present

## 2017-11-16 DIAGNOSIS — R531 Weakness: Secondary | ICD-10-CM | POA: Diagnosis present

## 2017-11-16 DIAGNOSIS — J449 Chronic obstructive pulmonary disease, unspecified: Secondary | ICD-10-CM | POA: Diagnosis present

## 2017-11-16 DIAGNOSIS — E876 Hypokalemia: Secondary | ICD-10-CM | POA: Diagnosis present

## 2017-11-16 DIAGNOSIS — F1120 Opioid dependence, uncomplicated: Secondary | ICD-10-CM | POA: Diagnosis present

## 2017-11-16 DIAGNOSIS — M549 Dorsalgia, unspecified: Secondary | ICD-10-CM | POA: Diagnosis present

## 2017-11-16 DIAGNOSIS — I959 Hypotension, unspecified: Secondary | ICD-10-CM | POA: Diagnosis present

## 2017-11-16 DIAGNOSIS — Z95828 Presence of other vascular implants and grafts: Secondary | ICD-10-CM

## 2017-11-16 DIAGNOSIS — R600 Localized edema: Secondary | ICD-10-CM | POA: Diagnosis present

## 2017-11-16 DIAGNOSIS — E785 Hyperlipidemia, unspecified: Secondary | ICD-10-CM | POA: Diagnosis present

## 2017-11-16 DIAGNOSIS — N1330 Unspecified hydronephrosis: Secondary | ICD-10-CM

## 2017-11-16 DIAGNOSIS — Z7902 Long term (current) use of antithrombotics/antiplatelets: Secondary | ICD-10-CM

## 2017-11-16 DIAGNOSIS — N289 Disorder of kidney and ureter, unspecified: Secondary | ICD-10-CM | POA: Diagnosis not present

## 2017-11-16 DIAGNOSIS — C3490 Malignant neoplasm of unspecified part of unspecified bronchus or lung: Secondary | ICD-10-CM | POA: Diagnosis present

## 2017-11-16 DIAGNOSIS — Z5329 Procedure and treatment not carried out because of patient's decision for other reasons: Secondary | ICD-10-CM | POA: Diagnosis not present

## 2017-11-16 DIAGNOSIS — Z8614 Personal history of Methicillin resistant Staphylococcus aureus infection: Secondary | ICD-10-CM

## 2017-11-16 DIAGNOSIS — Z853 Personal history of malignant neoplasm of breast: Secondary | ICD-10-CM

## 2017-11-16 DIAGNOSIS — N179 Acute kidney failure, unspecified: Secondary | ICD-10-CM | POA: Diagnosis present

## 2017-11-16 DIAGNOSIS — R310 Gross hematuria: Secondary | ICD-10-CM

## 2017-11-16 DIAGNOSIS — M84522A Pathological fracture in neoplastic disease, left humerus, initial encounter for fracture: Secondary | ICD-10-CM | POA: Diagnosis present

## 2017-11-16 DIAGNOSIS — Z87891 Personal history of nicotine dependence: Secondary | ICD-10-CM

## 2017-11-16 DIAGNOSIS — F329 Major depressive disorder, single episode, unspecified: Secondary | ICD-10-CM | POA: Diagnosis present

## 2017-11-16 DIAGNOSIS — C7802 Secondary malignant neoplasm of left lung: Secondary | ICD-10-CM | POA: Diagnosis present

## 2017-11-16 DIAGNOSIS — Z79899 Other long term (current) drug therapy: Secondary | ICD-10-CM

## 2017-11-16 DIAGNOSIS — R319 Hematuria, unspecified: Secondary | ICD-10-CM | POA: Diagnosis not present

## 2017-11-16 DIAGNOSIS — E1169 Type 2 diabetes mellitus with other specified complication: Secondary | ICD-10-CM | POA: Diagnosis present

## 2017-11-16 DIAGNOSIS — E869 Volume depletion, unspecified: Secondary | ICD-10-CM | POA: Diagnosis present

## 2017-11-16 DIAGNOSIS — Z66 Do not resuscitate: Secondary | ICD-10-CM | POA: Diagnosis present

## 2017-11-16 DIAGNOSIS — E669 Obesity, unspecified: Secondary | ICD-10-CM | POA: Diagnosis present

## 2017-11-16 DIAGNOSIS — Z515 Encounter for palliative care: Secondary | ICD-10-CM

## 2017-11-16 DIAGNOSIS — C3492 Malignant neoplasm of unspecified part of left bronchus or lung: Secondary | ICD-10-CM | POA: Diagnosis present

## 2017-11-16 DIAGNOSIS — K5909 Other constipation: Secondary | ICD-10-CM | POA: Diagnosis present

## 2017-11-16 DIAGNOSIS — I10 Essential (primary) hypertension: Secondary | ICD-10-CM | POA: Diagnosis present

## 2017-11-16 DIAGNOSIS — T148XXA Other injury of unspecified body region, initial encounter: Secondary | ICD-10-CM

## 2017-11-16 DIAGNOSIS — Z880 Allergy status to penicillin: Secondary | ICD-10-CM

## 2017-11-16 DIAGNOSIS — I679 Cerebrovascular disease, unspecified: Secondary | ICD-10-CM | POA: Diagnosis present

## 2017-11-16 DIAGNOSIS — A419 Sepsis, unspecified organism: Secondary | ICD-10-CM

## 2017-11-16 DIAGNOSIS — M79673 Pain in unspecified foot: Secondary | ICD-10-CM | POA: Diagnosis present

## 2017-11-16 DIAGNOSIS — E119 Type 2 diabetes mellitus without complications: Secondary | ICD-10-CM | POA: Diagnosis present

## 2017-11-16 DIAGNOSIS — E8809 Other disorders of plasma-protein metabolism, not elsewhere classified: Secondary | ICD-10-CM | POA: Diagnosis present

## 2017-11-16 DIAGNOSIS — Z885 Allergy status to narcotic agent status: Secondary | ICD-10-CM

## 2017-11-16 DIAGNOSIS — M84422A Pathological fracture, left humerus, initial encounter for fracture: Secondary | ICD-10-CM | POA: Diagnosis present

## 2017-11-16 DIAGNOSIS — Z8659 Personal history of other mental and behavioral disorders: Secondary | ICD-10-CM

## 2017-11-16 DIAGNOSIS — Z7984 Long term (current) use of oral hypoglycemic drugs: Secondary | ICD-10-CM

## 2017-11-16 LAB — COMPREHENSIVE METABOLIC PANEL
ALBUMIN: 2.7 g/dL — AB (ref 3.5–5.0)
ALT: 7 U/L (ref 0–44)
ANION GAP: 9 (ref 5–15)
AST: 12 U/L — AB (ref 15–41)
Alkaline Phosphatase: 74 U/L (ref 38–126)
BUN: 28 mg/dL — AB (ref 6–20)
CHLORIDE: 95 mmol/L — AB (ref 98–111)
CO2: 29 mmol/L (ref 22–32)
Calcium: 9.4 mg/dL (ref 8.9–10.3)
Creatinine, Ser: 1.67 mg/dL — ABNORMAL HIGH (ref 0.44–1.00)
GFR calc Af Amer: 39 mL/min — ABNORMAL LOW (ref >60)
GFR calc non Af Amer: 33 mL/min — ABNORMAL LOW (ref >60)
GLUCOSE: 128 mg/dL — AB (ref 70–99)
POTASSIUM: 3.3 mmol/L — AB (ref 3.5–5.1)
SODIUM: 133 mmol/L — AB (ref 135–145)
TOTAL PROTEIN: 7.6 g/dL (ref 6.5–8.1)
Total Bilirubin: 0.4 mg/dL (ref 0.3–1.2)

## 2017-11-16 LAB — URINALYSIS, ROUTINE W REFLEX MICROSCOPIC
Bilirubin Urine: NEGATIVE
GLUCOSE, UA: NEGATIVE mg/dL
KETONES UR: NEGATIVE mg/dL
Nitrite: NEGATIVE
PH: 5 (ref 5.0–8.0)
Protein, ur: 100 mg/dL — AB
Specific Gravity, Urine: 1.017 (ref 1.005–1.030)

## 2017-11-16 LAB — CBC
HCT: 28.1 % — ABNORMAL LOW (ref 36.0–46.0)
HEMATOCRIT: 24.9 % — AB (ref 36.0–46.0)
HEMOGLOBIN: 8.6 g/dL — AB (ref 12.0–15.0)
HEMOGLOBIN: 7.5 g/dL — AB (ref 12.0–15.0)
MCH: 23.6 pg — AB (ref 26.0–34.0)
MCH: 23.4 pg — AB (ref 26.0–34.0)
MCHC: 30.6 g/dL (ref 30.0–36.0)
MCHC: 30.1 g/dL (ref 30.0–36.0)
MCV: 77.2 fL — ABNORMAL LOW (ref 78.0–100.0)
MCV: 77.8 fL — AB (ref 78.0–100.0)
PLATELETS: 443 10*3/uL — AB (ref 150–400)
Platelets: 431 10*3/uL — ABNORMAL HIGH (ref 150–400)
RBC: 3.64 MIL/uL — ABNORMAL LOW (ref 3.87–5.11)
RBC: 3.2 MIL/uL — ABNORMAL LOW (ref 3.87–5.11)
RDW: 21.2 % — AB (ref 11.5–15.5)
RDW: 21.3 % — ABNORMAL HIGH (ref 11.5–15.5)
WBC: 13.8 10*3/uL — ABNORMAL HIGH (ref 4.0–10.5)
WBC: 11.6 10*3/uL — ABNORMAL HIGH (ref 4.0–10.5)

## 2017-11-16 LAB — DIFFERENTIAL
BASOS ABS: 0 10*3/uL (ref 0.0–0.1)
BASOS PCT: 0 %
EOS PCT: 1 %
Eosinophils Absolute: 0.1 10*3/uL (ref 0.0–0.7)
Lymphocytes Relative: 14 %
Lymphs Abs: 1.9 10*3/uL (ref 0.7–4.0)
MONO ABS: 1 10*3/uL (ref 0.1–1.0)
Monocytes Relative: 7 %
NEUTROS ABS: 10.8 10*3/uL (ref 1.7–7.7)
NEUTROS PCT: 78 %

## 2017-11-16 LAB — PROTIME-INR
INR: 1.23
INR: 1.24
Prothrombin Time: 15.4 seconds — ABNORMAL HIGH (ref 11.4–15.2)
Prothrombin Time: 15.5 seconds — ABNORMAL HIGH (ref 11.4–15.2)

## 2017-11-16 LAB — I-STAT CG4 LACTIC ACID, ED
LACTIC ACID, VENOUS: 1.52 mmol/L (ref 0.5–1.9)
Lactic Acid, Venous: 0.7 mmol/L (ref 0.5–1.9)

## 2017-11-16 LAB — APTT: APTT: 42 s — AB (ref 24–36)

## 2017-11-16 MED ORDER — SODIUM CHLORIDE 0.9 % IV SOLN
INTRAVENOUS | Status: AC
  Filled 2017-11-16 (×2): qty 1

## 2017-11-16 MED ORDER — HYDROMORPHONE HCL 1 MG/ML IJ SOLN
1.0000 mg | Freq: Once | INTRAMUSCULAR | Status: AC
  Administered 2017-11-16: 1 mg via INTRAVENOUS
  Filled 2017-11-16: qty 1

## 2017-11-16 MED ORDER — ONDANSETRON HCL 4 MG/2ML IJ SOLN
4.0000 mg | Freq: Once | INTRAMUSCULAR | Status: AC
  Administered 2017-11-16: 4 mg via INTRAVENOUS
  Filled 2017-11-16: qty 2

## 2017-11-16 MED ORDER — ACETAMINOPHEN 650 MG RE SUPP: 650.0000 mg | Freq: Four times a day (QID) | RECTAL | Status: DC | PRN

## 2017-11-16 MED ORDER — MONTELUKAST SODIUM 10 MG PO TABS
10.0000 mg | ORAL_TABLET | Freq: Every day | ORAL | Status: DC
  Administered 2017-11-16 – 2017-11-20 (×5): 10 mg via ORAL
  Filled 2017-11-16 (×5): qty 1

## 2017-11-16 MED ORDER — ALBUTEROL SULFATE HFA 108 (90 BASE) MCG/ACT IN AERS
1.0000 | INHALATION_SPRAY | Freq: Four times a day (QID) | RESPIRATORY_TRACT | Status: DC | PRN
  Filled 2017-11-16: qty 6.7

## 2017-11-16 MED ORDER — CIPROFLOXACIN IN D5W 400 MG/200ML IV SOLN
400.0000 mg | Freq: Once | INTRAVENOUS | Status: AC
  Administered 2017-11-16: 400 mg via INTRAVENOUS
  Filled 2017-11-16: qty 200

## 2017-11-16 MED ORDER — GABAPENTIN 400 MG PO CAPS: 400.0000 mg | ORAL_CAPSULE | ORAL | Status: DC

## 2017-11-16 MED ORDER — OXYCODONE HCL 5 MG PO TABS
30.0000 mg | ORAL_TABLET | Freq: Four times a day (QID) | ORAL | Status: DC | PRN
  Administered 2017-11-17 – 2017-11-20 (×10): 30 mg via ORAL
  Filled 2017-11-16 (×11): qty 6

## 2017-11-16 MED ORDER — ACETAMINOPHEN 325 MG PO TABS
650.0000 mg | ORAL_TABLET | Freq: Four times a day (QID) | ORAL | Status: DC | PRN
  Administered 2017-11-17: 650 mg via ORAL
  Filled 2017-11-16 (×2): qty 2

## 2017-11-16 MED ORDER — SODIUM CHLORIDE 0.9 % IV SOLN
INTRAVENOUS | Status: DC
  Administered 2017-11-16: 15:00:00 via INTRAVENOUS

## 2017-11-16 MED ORDER — SODIUM CHLORIDE 0.9 % IV BOLUS
1000.0000 mL | Freq: Once | INTRAVENOUS | Status: AC
  Administered 2017-11-16: 1000 mL via INTRAVENOUS

## 2017-11-16 MED ORDER — INSULIN ASPART 100 UNIT/ML ~~LOC~~ SOLN
0.0000 [IU] | Freq: Three times a day (TID) | SUBCUTANEOUS | Status: DC
  Administered 2017-11-17 – 2017-11-20 (×4): 1 [IU] via SUBCUTANEOUS

## 2017-11-16 MED ORDER — ATORVASTATIN CALCIUM 10 MG PO TABS
10.0000 mg | ORAL_TABLET | Freq: Every morning | ORAL | Status: DC
  Administered 2017-11-17 – 2017-11-21 (×5): 10 mg via ORAL
  Filled 2017-11-16 (×5): qty 1

## 2017-11-16 MED ORDER — LORATADINE 10 MG PO TABS
10.0000 mg | ORAL_TABLET | Freq: Every day | ORAL | Status: DC
  Administered 2017-11-17 – 2017-11-21 (×5): 10 mg via ORAL
  Filled 2017-11-16 (×5): qty 1

## 2017-11-16 MED ORDER — ONDANSETRON HCL 4 MG PO TABS: 4.0000 mg | ORAL_TABLET | Freq: Four times a day (QID) | ORAL | Status: DC | PRN

## 2017-11-16 MED ORDER — BISACODYL 10 MG RE SUPP: 10.0000 mg | Freq: Every day | RECTAL | Status: DC | PRN

## 2017-11-16 MED ORDER — SODIUM CHLORIDE 0.9 % IV SOLN
INTRAVENOUS | Status: DC
  Administered 2017-11-17 – 2017-11-18 (×6): via INTRAVENOUS

## 2017-11-16 MED ORDER — ONDANSETRON HCL 4 MG/2ML IJ SOLN
4.0000 mg | Freq: Four times a day (QID) | INTRAMUSCULAR | Status: DC | PRN
  Administered 2017-11-17: 4 mg via INTRAVENOUS
  Filled 2017-11-16: qty 2

## 2017-11-16 MED ORDER — POTASSIUM CHLORIDE CRYS ER 20 MEQ PO TBCR
30.0000 meq | EXTENDED_RELEASE_TABLET | Freq: Once | ORAL | Status: AC
  Administered 2017-11-16: 30 meq via ORAL
  Filled 2017-11-16: qty 2

## 2017-11-16 MED ORDER — ALPRAZOLAM 0.5 MG PO TABS
0.5000 mg | ORAL_TABLET | Freq: Three times a day (TID) | ORAL | Status: DC | PRN
  Administered 2017-11-17 (×2): 0.5 mg via ORAL
  Filled 2017-11-16 (×3): qty 1

## 2017-11-16 MED ORDER — SODIUM CHLORIDE 0.9 % IV SOLN
1.0000 g | Freq: Three times a day (TID) | INTRAVENOUS | Status: DC
  Administered 2017-11-17 – 2017-11-20 (×11): 1 g via INTRAVENOUS
  Filled 2017-11-16 (×15): qty 1

## 2017-11-16 MED ORDER — IOPAMIDOL (ISOVUE-300) INJECTION 61%
80.0000 mL | Freq: Once | INTRAVENOUS | Status: AC | PRN
  Administered 2017-11-16: 80 mL via INTRAVENOUS

## 2017-11-16 MED ORDER — SODIUM CHLORIDE 0.9 % IV SOLN
INTRAVENOUS | Status: DC
  Administered 2017-11-17 – 2017-11-19 (×3): via INTRAVENOUS

## 2017-11-16 NOTE — ED Notes (Signed)
Patient refused foley catheter then stated she needed to urinate. My self and Gust Rung NT told patient that we would assist her on the bed pan. Patient then stated that she did not have to urinate. Foley cathter procedure was explained to the patient and patient adamantly refused.

## 2017-11-16 NOTE — ED Notes (Signed)
Pt medicated for pain after her bp increased to 100/54.

## 2017-11-16 NOTE — ED Notes (Signed)
Contacted by lab for urinalysis and culture that was ordered at 13:00. Lab advised that patient's collection cup only contained one drop and no culture was sent. Patient can not give a urine at this time. I am putting in an order for a re-collection of urinalysis and a urine culture.

## 2017-11-16 NOTE — ED Provider Notes (Signed)
[facility] EMERGENCY DEPARTMENT Provider Note   CSN: [id number] Arrival date & time: 11/16/17  1020     History   Chief Complaint Chief Complaint  Patient presents with  . Hematuria    HPI [name] is a 57 y.o. female.  Patient has a history of chronic pain.  Patient with past history of left sided breast cancer several years ago had bilateral mastectomies.  Had lymph node dissection on the left as best we can tell.  In September of this month new diagnosis of left-sided lung cancer.  Being worked up by hematology oncology upstairs.  Just recently had biopsy done.  Patient was in the hospital when this was discovered she was admitted on the September 4.  Work-up showed left-sided lung cancer.  And also abnormalities with the kidneys which radiology thought was secondary to pyelonephritis however patient's urine was not significantly abnormal and cultures done at that time did not grow anything.  So they felt it was more consistent probably with metastatic disease.  Patient had a PET scan in the middle of September which showed no bony lesions but did show evidence of lesions in the soft tissue of the left arm.  Evidence of hilar adenopathy as well as necrotic tumor left lower lung.  Also showed bilateral kidney masses.  Showed a hypermetabolic retroperitoneal lymph node worrisome for metastatic adenopathy.  Nonspecific focus of intense uptake localized to the posterior wall of the stomach.  Focal area intense uptake identified within the soft tissues of the left upper arm.  That was suspicious for metastatic disease.  Patient 2 days ago started with hematuria contacted hematology oncology clinic they recommend that she get seen in the emergency department.  Upon arrival here blood pressure was 100.  But when she got back to the room we got blood pressures of 70 but she was not tachycardic.  In no acute distress.  Patient's daughter told us that patient has had chronic pain  problems before followed by pain management often uses additional drugs to include Xanax and takes additional oxycodone.  They recently increased her  oxycodone to pain management due to the cancer diagnosis.  Patient also with complaint of significant left arm pain starting a few days ago.  She thinks she injured it trying to get in a truck when someone needed to assist her to get in.  Patient is having difficulty with pain control increased pain to the left arm and blood in the urine.     Past Medical History:  Diagnosis Date  . Asthma   . Back muscle spasm 12/13/2010  . Cancer [facility])    breast cancer, remission 2010  . Chronic back pain   . Chronic foot pain   . COPD (chronic obstructive pulmonary disease) ([place]) 09/14/2010  . Diabetes mellitus without complication ([place])   . H/O MRSA infection 09/14/2010  . High blood cholesterol   . Hyperlipidemia 12/07/2012  . Hypertension   . Lung mass   . 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 Memory loss   . Psoriasis 12/13/2010  . Stroke ([place])   . Tobacco abuse 12/07/2012    Patient Active Problem List   Diagnosis Date Noted  . Mass of lower lobe of left lung 10/25/2017  . Pyelonephritis 10/25/2017  . Normocytic anemia 10/25/2017  . Hypokalemia 10/25/2017  . Acute bilat watershed infarction [facility]) 10/11/2016  . 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 Cerebral embolism with cerebral infarction 10/09/2016  . History of CVA (  cerebrovascular accident) 10/09/2016  . Chronic pain syndrome 10/09/2016  . Diabetes mellitus type 2 in obese ([place]) 10/09/2016  . Renal insufficiency   . Right-sided muscle weakness   . Eye drainage 02/28/2014  . Conjunctivitis 02/28/2014  . Back pain 02/28/2014  . Hyperlipidemia 12/07/2012  . Tobacco abuse 12/07/2012  . Psoriasis 12/13/2010  . COPD (chronic obstructive pulmonary disease) ([place]) 09/14/2010  . 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 H/O MRSA infection 09/14/2010  . Anxiety state 05/13/2009    . DEPRESSION 05/13/2009  . Essential hypertension 05/13/2009  . H/O Stage III Breast cancer, left 05/13/2009    Past Surgical History:  Procedure Laterality Date  . ABDOMINAL HYSTERECTOMY    . CESAREAN SECTION     x7  . IR ANGIO INTRA EXTRACRAN SEL COM CAROTID INNOMINATE BILAT MOD SED  10/10/2016  . IR ANGIO VERTEBRAL SEL SUBCLAVIAN INNOMINATE UNI R MOD SED  10/10/2016  . IR ANGIOGRAM EXTREMITY LEFT  10/10/2016  . MASTECTOMY     left  . MASTECTOMY Right 2009   prophylactic  . PORT-A-CATH REMOVAL  06/01/2011   Procedure: REMOVAL PORT-A-CATH;  Surgeon: Donato Heinz, MD;  Location: [facility];  Service: General;  Laterality: N/A;  Minor Room  . portacath insertion    . RADIOLOGY WITH ANESTHESIA N/A 01/23/2017   Procedure: ANGIOPLASTY WITH STENTING;  Surgeon: Luanne Bras, MD;  Location: [place];  Service: Radiology;  Laterality: N/A;     OB History   None      Home Medications    Prior to Admission medications   Medication Sig Start Date End Date Taking? Authorizing Provider  albuterol (PROAIR HFA) 108 (90 Base) MCG/ACT inhaler Inhale 1-2 puffs into the lungs every 6 (six) hours as needed for wheezing or shortness of breath.   Yes [provider]  ALPRAZolam Duanne Moron) 0.5 MG tablet Take 0.5 mg by mouth 3 (three) times daily as needed for anxiety.   Yes [provider]  amLODipine (NORVASC) 5 MG tablet Take 5 mg by mouth daily.   Yes [provider]  atorvastatin (LIPITOR) 10 MG tablet Take 1 tablet (10 mg total) by mouth daily at 6 PM. Patient taking differently: Take 10 mg by mouth every morning.  10/14/16  Yes Angiulli, Lavon Paganini, PA-C  cetirizine (ZYRTEC) 10 MG tablet Take 10 mg by mouth at bedtime.   Yes [provider]  gabapentin (NEURONTIN) 400 MG capsule Take 400-1,200 mg by mouth See admin instructions. 400mg  twice daily and 1200mg  at bedtime   Yes [provider]  losartan-hydrochlorothiazide (HYZAAR) 100-12.5 MG tablet Take 1  tablet by mouth daily.   Yes [provider]  metFORMIN (GLUCOPHAGE) 500 MG tablet Take 500 mg by mouth 2 (two) times daily.   Yes [provider]  montelukast (SINGULAIR) 10 MG tablet Take 10 mg by mouth at bedtime.   Yes [provider]  oxycodone (ROXICODONE) 30 MG immediate release tablet Take 30 mg by mouth every 6 (six) hours as needed for pain.   Yes [provider]  ticagrelor (BRILINTA) 90 MG TABS tablet Take 90 mg by mouth 2 (two) times daily.   Yes [provider]  levofloxacin (LEVAQUIN) 500 MG tablet Take 500 mg by mouth daily. 14 day course starting on 10/27/2017-COMPLETED    [provider]  oxyCODONE (ROXICODONE) 15 MG immediate release tablet Take 1 tablet (15 mg total) by mouth every 4 (four) hours as needed for pain. Patient not taking: Reported on  11/16/2017 10/27/17   Erline Hau, MD    Family History Family History  Problem Relation Age of Onset  . Stroke Mother   . Stroke Father   . Cancer Maternal Aunt   . Cancer Maternal Grandmother   . Cancer Maternal Aunt   . Cancer Maternal Aunt     Social History Social History   Tobacco Use  . Smoking status: Current Every Day Smoker    Packs/day: 0.25    Years: 36.00    Pack years: 9.00  . Smokeless tobacco: Never Used  . Tobacco comment: 3 cigarettes a day  Substance Use Topics  . Alcohol use: Not Currently    Comment: jack and coke social event  . Drug use: Not Currently    Types: Marijuana    Comment: in the past in the year 79     Allergies   Codeine and Penicillins   Review of Systems Review of Systems  Constitutional: Negative for fever.  HENT: Negative for congestion.   Eyes: Negative for redness.  Respiratory: Negative for shortness of breath.   Cardiovascular: Positive for leg swelling. Negative for chest pain.  Gastrointestinal: Negative for abdominal pain, nausea and vomiting.  Genitourinary: Positive for hematuria.    Musculoskeletal: Positive for arthralgias and myalgias.  Skin: Negative for wound.  Allergic/Immunologic: Positive for immunocompromised state.  Neurological: Negative for syncope and headaches.  Hematological: Does not bruise/bleed easily.  Psychiatric/Behavioral: Negative for confusion.     Physical Exam Updated Vital Signs BP (!) 96/54   Pulse 77   Temp 98.7 F (37.1 C) (Oral)   Resp 19   Ht 1.753 m (5\' 9" )   Wt 74.6 kg   SpO2 100%   BMI 24.29 kg/m   Physical Exam  Constitutional: She is oriented to person, place, and time. She appears well-developed and well-nourished. She appears distressed.  HENT:  Head: Normocephalic and atraumatic.  Mucous membranes dry  Eyes: Pupils are equal, round, and reactive to light. Conjunctivae and EOM are normal.  Neck: Normal range of motion. Neck supple.  Cardiovascular: Normal rate, regular rhythm and normal heart sounds.  Pulmonary/Chest: Effort normal and breath sounds normal. No respiratory distress.  Abdominal: Soft. Bowel sounds are normal. There is no tenderness.  Musculoskeletal: Normal range of motion. She exhibits edema and tenderness.  Shunt with bilateral lower extremity edema.  Patient with a swelling to the left forearm and hand.  Radial pulse 2+.  Good cap refill to the feet.  Tenderness to palpation to the mid aspect of the left humerus.  No obvious deformity.  Elbow shoulder appear to be okay forearm appears to be okay  Neurological: She is alert and oriented to person, place, and time. No cranial nerve deficit or sensory deficit. She exhibits normal muscle tone. Coordination normal.  Patient alert although will drift off and be drowsy at times.  Skin: Skin is warm. Capillary refill takes less than 2 seconds.  Nursing note and vitals reviewed.    ED Treatments / Results  Labs (all labs ordered are listed, but only abnormal results are displayed) Labs Reviewed  URINALYSIS, ROUTINE W REFLEX MICROSCOPIC - Abnormal;  Notable for the following components:      Result Value   Color, Urine AMBER (*)    APPearance CLOUDY (*)    Hgb urine dipstick LARGE (*)    Protein, ur 100 (*)    Leukocytes, UA TRACE (*)    RBC / HPF >50 (*)    Bacteria,  UA FEW (*)    All other components within normal limits  CBC - Abnormal; Notable for the following components:   WBC 13.8 (*)    RBC 3.64 (*)    Hemoglobin 8.6 (*)    HCT 28.1 (*)    MCV 77.2 (*)    MCH 23.6 (*)    RDW 21.2 (*)    Platelets 443 (*)    All other components within normal limits  COMPREHENSIVE METABOLIC PANEL - Abnormal; Notable for the following components:   Sodium 133 (*)    Potassium 3.3 (*)    Chloride 95 (*)    Glucose, Bld 128 (*)    BUN 28 (*)    Creatinine, Ser 1.67 (*)    Albumin 2.7 (*)    AST 12 (*)    GFR calc non Af Amer 33 (*)    GFR calc Af Amer 39 (*)    All other components within normal limits  PROTIME-INR - Abnormal; Notable for the following components:   Prothrombin Time 15.4 (*)    All other components within normal limits  APTT - Abnormal; Notable for the following components:   aPTT 42 (*)    All other components within normal limits  CULTURE, BLOOD (ROUTINE X 2)  CULTURE, BLOOD (ROUTINE X 2)  URINE CULTURE  DIFFERENTIAL  I-STAT CG4 LACTIC ACID, ED  I-STAT CG4 LACTIC ACID, ED    EKG None  Radiology Dg Chest 1 View  Result Date: 11/16/2017 CLINICAL DATA:  Lung cancer. Hematuria. Unable to lift her left arm. EXAM: CHEST  1 VIEW COMPARISON:  11/14/2017. PET-CT dated 11/03/2017. FINDINGS: Interval pathological fracture through the mid left humeral shaft in through and ill-defined lytic lesion with lateral angulation and mild lateral displacement of the distal fragment. Previously demonstrated left lower lobe mass and left axillary surgical clips. Clear right lung. Diffuse osteopenia. IMPRESSION: 1. Interval pathological fracture of the mid left humeral shaft at the location of a lytic metastasis. 2. Stable left  lower lobe mass. Electronically Signed   By: Claudie Revering M.D.   On: 11/16/2017 16:42   Ct Abdomen Pelvis W Contrast  Result Date: 11/16/2017 CLINICAL DATA:  Recently diagnosed metastatic lung cancer. Hematuria for the past 2 days. EXAM: CT ABDOMEN AND PELVIS WITH CONTRAST TECHNIQUE: Multidetector CT imaging of the abdomen and pelvis was performed using the standard protocol following bolus administration of intravenous contrast. CONTRAST:  64mL ISOVUE-300 IOPAMIDOL (ISOVUE-300) INJECTION 61% COMPARISON:  Abdomen and pelvis CT dated 10/25/2017 and PET-CT dated 13/2 1,019. FINDINGS: Lower chest: The pathological fracture of the mid left humeral shaft seen earlier today is again demonstrated on the scout image. Also again demonstrated is the previously demonstrated necrotic left lower lobe mass with an interval small left pleural effusion. Minimal atelectasis of the right lung base. Hepatobiliary: No focal liver abnormality is seen. No gallstones, gallbladder wall thickening, or biliary dilatation. Pancreas: Unremarkable. No pancreatic ductal dilatation or surrounding inflammatory changes. Spleen: Stable cystic lesions. Adrenals/Urinary Tract: Normal appearing adrenal glands. Mild increase in size and decrease in density of multiple heterogeneously hypodense masses in both kidneys. A simple parapelvic cyst in the inferior left kidney is unchanged. There is mildly increased perinephric soft tissue stranding on the right with interval mild perinephric soft tissue stranding on the left. There is also periureteric soft tissue stranding on the right, not previously present. Normal appearing urinary bladder and left ureter. No urinary tract calculi or hydronephrosis. Stomach/Bowel: Unremarkable stomach, small bowel and colon. No  evidence of appendicitis. Vascular/Lymphatic: Atheromatous arterial calcifications without aneurysm. Mildly enlarged retrocaval lymph nodes at the level of the kidneys the largest has some  central low density, similar to the lesions in the kidneys, with a short axis diameter of 10 mm on image number 34 series 2. Mildly prominent left para-aortic lymph nodes more inferiorly without abnormal enlargement. Reproductive: Status post hysterectomy. No adnexal masses. Other: Midline surgical scar. Musculoskeletal: Mild lumbar and lower thoracic spine degenerative changes. IMPRESSION: 1. Mild increase in size and decrease in density of multiple bilateral renal masses, with mildly increased right perinephric soft tissue stranding and interval mild left perinephric soft tissue stranding. This combination of findings is most compatible with progressive bilateral pyelonephritis with intrarenal abscesses. Again malignancy, such as lymphoma or renal metastases, is also a possibility. 2. No urinary tract calculi or hydronephrosis. 3. Stable pathological fracture of the mid left humeral shaft. 4. Interval mild retroperitoneal adenopathy. This could be reactive due to the adjacent pyelonephritis or metastatic if the areas in the kidneys are malignant. 5. Previously demonstrated left lower lobe necrotic lung cancer with an interval small left pleural effusion. Electronically Signed   By: Claudie Revering M.D.   On: 11/16/2017 20:49   US Venous Img Upper Left (dvt Study)  Result Date: 11/16/2017 CLINICAL DATA:  57 year old female with left upper extremity swelling from the shoulder to the hand for the past 2 days EXAM: LEFT UPPER EXTREMITY VENOUS DOPPLER ULTRASOUND TECHNIQUE: Gray-scale sonography with graded compression, as well as color Doppler and duplex ultrasound were performed to evaluate the upper extremity deep venous system from the level of the subclavian vein and including the jugular, axillary, basilic, radial, ulnar and upper cephalic vein. Spectral Doppler was utilized to evaluate flow at rest and with distal augmentation maneuvers. COMPARISON:  None. FINDINGS: Contralateral Subclavian Vein: Respiratory  phasicity is normal and symmetric with the symptomatic side. No evidence of thrombus. Normal compressibility. Internal Jugular Vein: No evidence of thrombus. Normal compressibility, respiratory phasicity and response to augmentation. Subclavian Vein: No evidence of thrombus. Normal compressibility, respiratory phasicity and response to augmentation. Axillary Vein: Unable to image secondary to patient non tolerance. Cephalic Vein: No evidence of thrombus. Normal compressibility, respiratory phasicity and response to augmentation. Basilic Vein: No evidence of thrombus. Normal compressibility, respiratory phasicity and response to augmentation. Brachial Veins: No evidence of thrombus. Normal compressibility, respiratory phasicity and response to augmentation. Radial Veins: No evidence of thrombus. Normal compressibility, respiratory phasicity and response to augmentation. Ulnar Veins: No evidence of thrombus. Normal compressibility, respiratory phasicity and response to augmentation. Venous Reflux:  None visualized. Other Findings:  None visualized. IMPRESSION: No evidence of DVT within the left upper extremity. Electronically Signed   By: Jacqulynn Cadet M.D.   On: 11/16/2017 17:25    Procedures Procedures (including critical care time)  CRITICAL CARE Performed by: Fredia Sorrow Total critical care time: 30 minutes Critical care time was exclusive of separately billable procedures and treating other patients. Critical care was necessary to treat or prevent imminent or life-threatening deterioration. Critical care was time spent personally by me on the following activities: development of treatment plan with patient and/or surrogate as well as nursing, discussions with consultants, evaluation of patient's response to treatment, examination of patient, obtaining history from patient or surrogate, ordering and performing treatments and interventions, ordering and review of laboratory studies, ordering and  review of radiographic studies, pulse oximetry and re-evaluation of patient's condition.   Medications Ordered in ED Medications  0.9 %  sodium  chloride infusion ( Intravenous New Bag/Given 11/16/17 1443)  sodium chloride 0.9 % bolus 1,000 mL (0 mLs Intravenous Stopped 11/16/17 1647)  HYDROmorphone (DILAUDID) injection 1 mg (1 mg Intravenous Given 11/16/17 1748)  ondansetron (ZOFRAN) injection 4 mg (4 mg Intravenous Given 11/16/17 1748)  ciprofloxacin (CIPRO) IVPB 400 mg (0 mg Intravenous Stopped 11/16/17 1646)  HYDROmorphone (DILAUDID) injection 1 mg (1 mg Intravenous Given 11/16/17 1830)  iopamidol (ISOVUE-300) 61 % injection 80 mL (80 mLs Intravenous Contrast Given 11/16/17 1952)  HYDROmorphone (DILAUDID) injection 1 mg (1 mg Intravenous Given 11/16/17 2024)     Initial Impression / Assessment and Plan / ED Course  I have reviewed the triage vital signs and the nursing notes.  Pertinent labs & imaging results that were available during my care of the patient were reviewed by me and considered in my medical decision making (see chart for details).    And review of patient's work-up throughout September patient has had bilateral lower extremity Dopplers which were negative for deep vein thrombosis.  Here today we did a Doppler of the left arm due to the swelling which was negative.  But then chest x-ray revealed that there is a pathological fracture of the left humerus which probably explains the swelling there.  For this fracture patient was placed in a long-arm splint discussed with orthopedics Dr.Harrison who will consult on patient although cannot probably fix her permanently here.  But he said there is no rush on that.  Patient initially had blood pressures here in the 70s which raise some concerns for obvious hypotension.  However we were getting mixed picture on the blood pressure sometimes it would be in the 90s Would be in the 70s was not sure which one was accurate but her lactic acid  came back normal.  So probably pressure was not consistently in the 70s.  Patient did receive 1 L of fluid that did help bring her pressures more consistently up into the 90s.  Patient was never tachycardic.  Chest x-ray showed persistent lung disease on the left side as well as the pathological fracture of the left midshaft humerus.  Patient was placed in the long-arm splint however it was not adequate to the top of the splint came to really where the fracture site was.  We wanted to re-change that out and get it up higher into the arm patient refused.  But said that she will reconsider tomorrow.  Patient understands that is not stabilized properly however she is more comfortable with the splint on although an adequate and with a sling.  Patient originally came here for hematuria does have blood in her urine as well as some white blood cells.  In the past her urine has not grown bacteria to be consistent with infection.  There was concerns with previous CTs earlier in September that she had bilateral pyelonephritis.  CT scan of the abdomen was repeated here today.  Shows some worsening enlargement of kidneys there is no hydronephrosis there is no evidence of obstruction.  Again they raise concerns about pyelonephritis but patient white blood cell count a little higher here today at 13,000 she is not febrile.  Urine culture sent hospitalist [facility], Foley catheter so that they can monitor the blood in the urine.  Patient started on Cipro for possible infection as she was started on Cipro because she has severe penicillin allergy and not a candidate for cephalosporins.  Due to the hypotension patient had blood cultures and repeat lactic  acid.  I should like the acid improved although the initial one was normal with the fluids.  Do not feel patient was septic.  Blood pressures are now in the 90s in the 100s.  Feel that most of patient's problems were related to the pathological fracture and the abnormal  kidneys which I suspect may be neoplastic in nature.  Discussed with hospitalist for admission and I will need to be consulted on by hematology oncology and we already made discussion to orthopedics about the pathological fracture.  CT scan also showed necrotic lung neoplastic process on the left side.  In addition patient's daughter warned [place] that patient has pain medication in her pocketbook and she will take extra doses of that.   Patient was initially resistant to the admission.  But she has decided to be admitted.  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

## 2017-11-16 NOTE — H&P (Addendum)
History and Physical    [name] WJX:[id number] DOB: 01/10/1961 DOA: 11/16/2017  PCP: Javier Docker, MD   Patient coming from: Home  I have personally briefly reviewed patient's old medical records in [place]  Chief Complaint: Hematuria, left arm pain   HPI: [name] is a 57 y.o. female with medical history significant of metastatic lung cancer, chronic pain syndrome, type 2 diabetes, hypertension, presents with hematuria.  Patient start started noticing hematuria about 2 days ago.  She denies any fever.  Not noticed any change in her urine output.  Of note patient has a history of stroke about a year ago and she is on Brilinta and aspirin.  Patient was diagnosed with lung cancer in September of this year.  She had a lung biopsy done on September 24 of this year.  That day leaving that appointment her daughter helped her get in the car and acute pain of the left arm.  sHe states she had discomfort in the arm even before her diagnosis as well.  Today she was found to have a pathologic fracture of that arm.*PET scan  9/13 showed focal area of intense uptake within the soft tissues of the left upper arm.  She also had bilateral kidney masses which appear low-attenuation increased FDG uptake concerning for bilateral kidney metastasis if urinary tract infection findings were absent.  Her urinalysis showed a few bacteria, trace leuk, large amount of hemoglobin and greater than 50 RBCs.  However did appear contaminated with 21-50 squamous cells and WBC clumps.  Patient was hypotensive upon presentation to the ED.  She is has not been tachycardic, she is afebrile. Her LA was wnl x 2.  After IV fluids ,her blood pressure has increased, most recent to be in the 1 teens.  At baseline she is hypertensive on 3 medications including ARB and diuretic.  Recently admitted  82 /4 for pyelonephritis however her urine cultures were negative.  ED Course: Splint was placed on  patient's left arm however it was incorrectly placed.  They report patient to replace it and she refused.  She is now also in a splint.  Orthopedic surgery Dr. Kenton Kingfisher was consulted.  She was given IV Cipro since she has had a severe reaction to penicillin in the past.  She also received IV Dilaudid and 1 L IV fluid.  Review of Systems: Denies CP, SOB, nausea, vomiting, constipation. + anorexia positive for lower extremity edema since her discharge from hospital earlier this month All others reviewed with patient  and are  negative unless otherwise stated    Past Medical History:  Diagnosis Date  . Asthma   . Back muscle spasm 12/13/2010  . Cancer [facility])    breast cancer, remission 2010  . Chronic back pain   . Chronic foot pain   . COPD (chronic obstructive pulmonary disease) ([place]) 09/14/2010  . Diabetes mellitus without complication ([place])   . H/O MRSA infection 09/14/2010  . High blood cholesterol   . Hyperlipidemia 12/07/2012  . Hypertension   . Lung mass   . 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 Memory loss   . Psoriasis 12/13/2010  . Stroke ([place])   . Tobacco abuse Met Lung Cancer DX sept 2019  12/07/2012    Past Surgical History:  Procedure Laterality Date  . ABDOMINAL HYSTERECTOMY    . CESAREAN SECTION     x7  . IR ANGIO INTRA EXTRACRAN SEL COM CAROTID INNOMINATE BILAT MOD SED  10/10/2016  . IR ANGIO VERTEBRAL SEL SUBCLAVIAN INNOMINATE UNI R MOD SED  10/10/2016  . IR ANGIOGRAM EXTREMITY LEFT  10/10/2016  . MASTECTOMY     left  . MASTECTOMY Right 2009   prophylactic  . PORT-A-CATH REMOVAL  06/01/2011   Procedure: REMOVAL PORT-A-CATH;  Surgeon: Donato Heinz, MD;  Location: [facility];  Service: General;  Laterality: N/A;  Minor Room  . portacath insertion    . RADIOLOGY WITH ANESTHESIA N/A 01/23/2017   Procedure: ANGIOPLASTY WITH STENTING;  Surgeon: Luanne Bras, MD;  Location: [place];  Service: Radiology;  Laterality: N/A;     reports that she quit smoking about 20  months ago. She has a 9.00 pack-year smoking history. She has never used smokeless tobacco. She reports that she drank alcohol. She reports that she has current or past drug history. Drug: Marijuana.  Allergies  Allergen Reactions  . Codeine Anaphylaxis and Rash  . Penicillins Anaphylaxis, Rash and Other (See Comments)    Has patient had a PCN reaction causing immediate rash, facial/tongue/throat swelling, SOB or lightheadedness with hypotension: Yes Has patient had a PCN reaction causing severe rash involving mucus membranes or skin necrosis: No Has patient had a PCN reaction that required hospitalization: Yes Has patient had a PCN reaction occurring within the last 10 years: No If all of the above answers are "NO", then may proceed with Cephalosporin use.     Family History  Problem Relation Age of Onset  . Stroke Mother   . Stroke Father   . Cancer Maternal Aunt   . Cancer Maternal Grandmother   . Cancer Maternal Aunt   . Cancer Maternal Aunt      Prior to Admission medications   Medication Sig Start Date End Date Taking? Authorizing Provider  albuterol (PROAIR HFA) 108 (90 Base) MCG/ACT inhaler Inhale 1-2 puffs into the lungs every 6 (six) hours as needed for wheezing or shortness of breath.   Yes [provider]  ALPRAZolam Duanne Moron) 0.5 MG tablet Take 0.5 mg by mouth 3 (three) times daily as needed for anxiety.   Yes [provider]  amLODipine (NORVASC) 5 MG tablet Take 5 mg by mouth daily.   Yes [provider]  atorvastatin (LIPITOR) 10 MG tablet Take 1 tablet (10 mg total) by mouth daily at 6 PM. Patient taking differently: Take 10 mg by mouth every morning.  10/14/16  Yes Angiulli, Lavon Paganini, PA-C  cetirizine (ZYRTEC) 10 MG tablet Take 10 mg by mouth at bedtime.   Yes [provider]  gabapentin (NEURONTIN) 400 MG capsule Take 400-1,200 mg by mouth See admin instructions. 467m twice daily and 12071mat bedtime   Yes [provider]   losartan-hydrochlorothiazide (HYZAAR) 100-12.5 MG tablet Take 1 tablet by mouth daily.   Yes [provider]  metFORMIN (GLUCOPHAGE) 500 MG tablet Take 500 mg by mouth 2 (two) times daily.   Yes [provider]  montelukast (SINGULAIR) 10 MG tablet Take 10 mg by mouth at bedtime.   Yes [provider]  oxycodone (ROXICODONE) 30 MG immediate release tablet Take 30 mg by mouth every 6 (six) hours as needed for pain.   Yes [provider]  ticagrelor (BRILINTA) 90 MG TABS tablet Take 90 mg by mouth 2 (two) times daily.   Yes [provider]  levofloxacin (LEVAQUIN) 500 MG tablet Take 500 mg by mouth daily. 14 day course starting on 10/27/2017-COMPLETED    [provider]  oxyCODONE (ROXICODONE) 15  MG immediate release tablet Take 1 tablet (15 mg total) by mouth every 4 (four) hours as needed for pain. Patient not taking: Reported on 11/16/2017 10/27/17   Isaac Bliss, Rayford Halsted, MD    Physical Exam: Vitals:   11/16/17 1730 11/16/17 1930 11/16/17 2117 11/16/17 2130  BP: (!) 100/54 (!) 96/54 (!) 96/54   Pulse:   78   Resp: 17 19 13 19   Temp:      TempSrc:      SpO2:  100% 100% 100%  Weight:      Height:        Constitutional: NAD, calm, comfortable Vitals:   11/16/17 1730 11/16/17 1930 11/16/17 2117 11/16/17 2130  BP: (!) 100/54 (!) 96/54 (!) 96/54   Pulse:   78   Resp: 17 19 13 19   Temp:      TempSrc:      SpO2:  100% 100% 100%  Weight:      Height:       Eyes: PERRL, lids and conjunctivae normal ENMT: Mucous membranes are DRY . Posterior pharynx clear of any exudate or lesions.  Neck: normal, supple, no masses,  Respiratory: few rales on Left  Normal respiratory effort. No accessory muscle use.  Chest: b/l mastectomy Cardiovascular: Regular rate and rhythm, no murmurs / rubs / gallops.  2+ lower extremity edema. 2+ pedal pulses. .  Abdomen: no tenderness, no masses palpated. No hepatosplenomegaly. Bowel sounds positive.  obses Musculoskeletal: no clubbing / cyanosis. Good ROM RUE b/l LE, no contractures. Normal muscle tone. Left arm in sling Skin: no rashes, lesions, ulcers. No induration Neurologic: CN 2-12 grossly intact. 5/5 strength except . LUE not tested due to FX   Psychiatric: Normal judgment and insight. Alert and oriented x 3. Normal mood.     Labs on Admission: I have personally reviewed following labs and imaging studies  CBC: Recent Labs  Lab 11/14/17 1223 11/16/17 1244  WBC 12.6* 13.8*  NEUTROABS  --  10.8  HGB 8.2* 8.6*  HCT 27.9* 28.1*  MCV 79.0 77.2*  PLT 487* 786*   Basic Metabolic Panel: Recent Labs  Lab 11/16/17 1244  NA 133*  K 3.3*  CL 95*  CO2 29  GLUCOSE 128*  BUN 28*  CREATININE 1.67*  CALCIUM 9.4   GFR: Estimated Creatinine Clearance: 39.3 mL/min (A) (by C-G formula based on SCr of 1.67 mg/dL (H)). Liver Function Tests: Recent Labs  Lab 11/16/17 1244  AST 12*  ALT 7  ALKPHOS 74  BILITOT 0.4  PROT 7.6  ALBUMIN 2.7*   No results for input(s): LIPASE, AMYLASE in the last 168 hours. No results for input(s): AMMONIA in the last 168 hours. Coagulation Profile: Recent Labs  Lab 11/14/17 1223 11/16/17 1244  INR 1.11 1.23   Cardiac Enzymes: No results for input(s): CKTOTAL, CKMB, CKMBINDEX, TROPONINI in the last 168 hours. BNP (last 3 results) No results for input(s): PROBNP in the last 8760 hours. HbA1C: No results for input(s): HGBA1C in the last 72 hours. CBG: No results for input(s): GLUCAP in the last 168 hours. Lipid Profile: No results for input(s): CHOL, HDL, LDLCALC, TRIG, CHOLHDL, LDLDIRECT in the last 72 hours. Thyroid Function Tests: No results for input(s): TSH, T4TOTAL, FREET4, T3FREE, THYROIDAB in the last 72 hours. Anemia Panel: No results for input(s): VITAMINB12, FOLATE, FERRITIN, TIBC, IRON, RETICCTPCT in the last 72 hours. Urine analysis:    Component Value Date/Time   COLORURINE AMBER (A) 11/16/2017 1129   APPEARANCEUR  CLOUDY (A)  11/16/2017 1129   LABSPEC 1.017 11/16/2017 1129   PHURINE 5.0 11/16/2017 1129   GLUCOSEU NEGATIVE 11/16/2017 1129   HGBUR LARGE (A) 11/16/2017 1129   BILIRUBINUR NEGATIVE 11/16/2017 1129   KETONESUR NEGATIVE 11/16/2017 1129   PROTEINUR 100 (A) 11/16/2017 1129   UROBILINOGEN 0.2 06/01/2011 1541   NITRITE NEGATIVE 11/16/2017 1129   LEUKOCYTESUR TRACE (A) 11/16/2017 1129    Radiological Exams on Admission: Dg Chest 1 View  Result Date: 11/16/2017 CLINICAL DATA:  Lung cancer. Hematuria. Unable to lift her left arm. EXAM: CHEST  1 VIEW COMPARISON:  11/14/2017. PET-CT dated 11/03/2017. FINDINGS: Interval pathological fracture through the mid left humeral shaft in through and ill-defined lytic lesion with lateral angulation and mild lateral displacement of the distal fragment. Previously demonstrated left lower lobe mass and left axillary surgical clips. Clear right lung. Diffuse osteopenia. IMPRESSION: 1. Interval pathological fracture of the mid left humeral shaft at the location of a lytic metastasis. 2. Stable left lower lobe mass. Electronically Signed   By: Claudie Revering M.D.   On: 11/16/2017 16:42   Ct Abdomen Pelvis W Contrast  Result Date: 11/16/2017 CLINICAL DATA:  Recently diagnosed metastatic lung cancer. Hematuria for the past 2 days. EXAM: CT ABDOMEN AND PELVIS WITH CONTRAST TECHNIQUE: Multidetector CT imaging of the abdomen and pelvis was performed using the standard protocol following bolus administration of intravenous contrast. CONTRAST:  51m ISOVUE-300 IOPAMIDOL (ISOVUE-300) INJECTION 61% COMPARISON:  Abdomen and pelvis CT dated 10/25/2017 and PET-CT dated 13/2 1,019. FINDINGS: Lower chest: The pathological fracture of the mid left humeral shaft seen earlier today is again demonstrated on the scout image. Also again demonstrated is the previously demonstrated necrotic left lower lobe mass with an interval small left pleural effusion. Minimal atelectasis of the right lung  base. Hepatobiliary: No focal liver abnormality is seen. No gallstones, gallbladder wall thickening, or biliary dilatation. Pancreas: Unremarkable. No pancreatic ductal dilatation or surrounding inflammatory changes. Spleen: Stable cystic lesions. Adrenals/Urinary Tract: Normal appearing adrenal glands. Mild increase in size and decrease in density of multiple heterogeneously hypodense masses in both kidneys. A simple parapelvic cyst in the inferior left kidney is unchanged. There is mildly increased perinephric soft tissue stranding on the right with interval mild perinephric soft tissue stranding on the left. There is also periureteric soft tissue stranding on the right, not previously present. Normal appearing urinary bladder and left ureter. No urinary tract calculi or hydronephrosis. Stomach/Bowel: Unremarkable stomach, small bowel and colon. No evidence of appendicitis. Vascular/Lymphatic: Atheromatous arterial calcifications without aneurysm. Mildly enlarged retrocaval lymph nodes at the level of the kidneys the largest has some central low density, similar to the lesions in the kidneys, with a short axis diameter of 10 mm on image number 34 series 2. Mildly prominent left para-aortic lymph nodes more inferiorly without abnormal enlargement. Reproductive: Status post hysterectomy. No adnexal masses. Other: Midline surgical scar. Musculoskeletal: Mild lumbar and lower thoracic spine degenerative changes. IMPRESSION: 1. Mild increase in size and decrease in density of multiple bilateral renal masses, with mildly increased right perinephric soft tissue stranding and interval mild left perinephric soft tissue stranding. This combination of findings is most compatible with progressive bilateral pyelonephritis with intrarenal abscesses. Again malignancy, such as lymphoma or renal metastases, is also a possibility. 2. No urinary tract calculi or hydronephrosis. 3. Stable pathological fracture of the mid left humeral  shaft. 4. Interval mild retroperitoneal adenopathy. This could be reactive due to the adjacent pyelonephritis or metastatic if the areas in the kidneys  are malignant. 5. Previously demonstrated left lower lobe necrotic lung cancer with an interval small left pleural effusion. Electronically Signed   By: Claudie Revering M.D.   On: 11/16/2017 20:49   US Venous Img Upper Left (dvt Study)  Result Date: 11/16/2017 CLINICAL DATA:  57 year old female with left upper extremity swelling from the shoulder to the hand for the past 2 days EXAM: LEFT UPPER EXTREMITY VENOUS DOPPLER ULTRASOUND TECHNIQUE: Gray-scale sonography with graded compression, as well as color Doppler and duplex ultrasound were performed to evaluate the upper extremity deep venous system from the level of the subclavian vein and including the jugular, axillary, basilic, radial, ulnar and upper cephalic vein. Spectral Doppler was utilized to evaluate flow at rest and with distal augmentation maneuvers. COMPARISON:  None. FINDINGS: Contralateral Subclavian Vein: Respiratory phasicity is normal and symmetric with the symptomatic side. No evidence of thrombus. Normal compressibility. Internal Jugular Vein: No evidence of thrombus. Normal compressibility, respiratory phasicity and response to augmentation. Subclavian Vein: No evidence of thrombus. Normal compressibility, respiratory phasicity and response to augmentation. Axillary Vein: Unable to image secondary to patient non tolerance. Cephalic Vein: No evidence of thrombus. Normal compressibility, respiratory phasicity and response to augmentation. Basilic Vein: No evidence of thrombus. Normal compressibility, respiratory phasicity and response to augmentation. Brachial Veins: No evidence of thrombus. Normal compressibility, respiratory phasicity and response to augmentation. Radial Veins: No evidence of thrombus. Normal compressibility, respiratory phasicity and response to augmentation. Ulnar Veins: No  evidence of thrombus. Normal compressibility, respiratory phasicity and response to augmentation. Venous Reflux:  None visualized. Other Findings:  None visualized. IMPRESSION: No evidence of DVT within the left upper extremity. Electronically Signed   By: Jacqulynn Cadet M.D.   On: 11/16/2017 17:25      Assessment/Plan Principal Problem:   Hypotension Active Problems:   Chronic pain syndrome   Diabetes mellitus type 2 in obese (HCC)   Lung cancer, primary, with metastasis from lung to other site, left (HCC)   Hematuria   Anemia   Pathological fracture of left humerus Hypokalemia ? Early sepsis ? +  Leucocytosis,   Renal insuff Hx CVA  -Cont IVF, Empiric  aztreonam, Follow Urine CX> + leukocytosis. Nml LA. No tachycardia, suspect UA contaminated 21-50 squam cells  -hold BP meds, and nephrotoxins ( Arb/diuretic) -Place Foley, type and screen, Follow h/h ,Hgb stable, up from last of 7.6, Hold Brilinta.  CT scan abdomen and pelvis did not show any hydronephrosis but did show increase size of densities in her kidneys.  Suspect her ongoing renal failure is from volume depletion and also metastasis -Oncology consult -replace K+ PO -Cont home pain regemine , with IV break through . Daughter informed ER doc that patient will hide hydrocodone in her purse and take more than she is suppose to . Watch for over sedation. Cautions with pain meds as she is  hypotensive. -Carb consistent diet, sliding scale insulin , check hemoglobin A1c, d/c metformin - renal function nml 9 months ago, started increasing 3 weeks ago in our system. Treat as above, suspect from renal mets  -Pt refused replacement of splint in ED> Ortho consul ( Dr Aline Brochure) . Cont with sling for now.  -Hx cva. Will hold brillinta and Asa for now      DVT prophylaxis: SCD  Code Status: DNI Consults: ortho Dr Aline Brochure Disposition Plan: home 3-4 days   Admission status: IP SDU It is my clinical opinion that admission to  INPATIENT is reasonable and necessary because of the  expectation that this patient will require hospital care that crosses at least 2 midnights to treat this condition based on the medical complexity of the problems presented.  Given the aforementioned information, the predictability of an adverse outcome is felt to be significant    Shelbie Proctor MD [facility] Pager [phone]  If 7PM-7AM, please contact night-coverage [email or web address] Password [facility]  11/16/2017, 10:42 PM

## 2017-11-16 NOTE — ED Notes (Signed)
Patient transported to CT 

## 2017-11-16 NOTE — ED Notes (Addendum)
Dr. Rogene Houston advised that the initial splint on the patient's left arm was not high enough and needed to be replaced. Patient was difficult in CT getting her CT scan. Patient returned and was given pain medication. Patient agreed to have her temporary splint re-done and then in the process of taking off the splint patient adamantly refused to let myself and RN Ruthy Dick take off the current splint or place a new splint on. Patient was advised this needed to be done. Dr. Rogene Houston also advised the patient of the same. Dr.  Rogene Houston  was advised that patient refused new splint. Dr. Rogene Houston advised to document the incident.

## 2017-11-16 NOTE — ED Triage Notes (Signed)
New onset Lung cancer patient,  Onset 2 days ago blood in urine, Called cancer center, she was told to come to ED for evaluation.

## 2017-11-16 NOTE — Telephone Encounter (Signed)
Patient had called and left message after hours yesterday. When I called patient back this am, I was unable to reach her or leave a message. Patients family called back around 21 stating patient was having blood in her urine, pain in arms, neck and back, unable to move due to pain, and leg swelling. Explained to family that patient should call her PCP or go to ER to be checked out, especially if she was hurting so bad she could not move. Family verbalized understanding and states they will take patient to the [facility] ER.

## 2017-11-17 ENCOUNTER — Inpatient Hospital Stay (HOSPITAL_COMMUNITY): Payer: Medicaid Other

## 2017-11-17 ENCOUNTER — Encounter (HOSPITAL_COMMUNITY): Payer: Self-pay

## 2017-11-17 ENCOUNTER — Other Ambulatory Visit: Payer: Self-pay

## 2017-11-17 DIAGNOSIS — K5909 Other constipation: Secondary | ICD-10-CM | POA: Diagnosis present

## 2017-11-17 LAB — CBC
HEMATOCRIT: 23.4 % — AB (ref 36.0–46.0)
HEMOGLOBIN: 7.1 g/dL — AB (ref 12.0–15.0)
MCH: 23.6 pg — ABNORMAL LOW (ref 26.0–34.0)
MCHC: 30.3 g/dL (ref 30.0–36.0)
MCV: 77.7 fL — ABNORMAL LOW (ref 78.0–100.0)
Platelets: 409 10*3/uL — ABNORMAL HIGH (ref 150–400)
RBC: 3.01 MIL/uL — AB (ref 3.87–5.11)
RDW: 21.5 % — ABNORMAL HIGH (ref 11.5–15.5)
WBC: 10.3 10*3/uL (ref 4.0–10.5)

## 2017-11-17 LAB — GLUCOSE, CAPILLARY
GLUCOSE-CAPILLARY: 98 mg/dL (ref 70–99)
Glucose-Capillary: 127 mg/dL — ABNORMAL HIGH (ref 70–99)
Glucose-Capillary: 146 mg/dL — ABNORMAL HIGH (ref 70–99)
Glucose-Capillary: 141 mg/dL — ABNORMAL HIGH (ref 70–99)
Glucose-Capillary: 119 mg/dL — ABNORMAL HIGH (ref 70–99)

## 2017-11-17 LAB — URINALYSIS, ROUTINE W REFLEX MICROSCOPIC
BILIRUBIN URINE: NEGATIVE
GLUCOSE, UA: NEGATIVE mg/dL
KETONES UR: NEGATIVE mg/dL
LEUKOCYTES UA: NEGATIVE
NITRITE: NEGATIVE
PH: 5 (ref 5.0–8.0)
Protein, ur: 30 mg/dL — AB
SPECIFIC GRAVITY, URINE: 1.021 (ref 1.005–1.030)

## 2017-11-17 LAB — CBG MONITORING, ED: Glucose-Capillary: 125 mg/dL — ABNORMAL HIGH (ref 70–99)

## 2017-11-17 LAB — BASIC METABOLIC PANEL
ANION GAP: 7 (ref 5–15)
BUN: 24 mg/dL — ABNORMAL HIGH (ref 6–20)
CHLORIDE: 102 mmol/L (ref 98–111)
CO2: 27 mmol/L (ref 22–32)
CREATININE: 1.51 mg/dL — AB (ref 0.44–1.00)
Calcium: 8.8 mg/dL — ABNORMAL LOW (ref 8.9–10.3)
GFR calc non Af Amer: 38 mL/min — ABNORMAL LOW (ref >60)
GFR, EST AFRICAN AMERICAN: 44 mL/min — AB (ref >60)
Glucose, Bld: 101 mg/dL — ABNORMAL HIGH (ref 70–99)
Potassium: 4 mmol/L (ref 3.5–5.1)
SODIUM: 136 mmol/L (ref 135–145)

## 2017-11-17 LAB — PREPARE RBC (CROSSMATCH)

## 2017-11-17 LAB — LACTIC ACID, PLASMA: Lactic Acid, Venous: 1.1 mmol/L (ref 0.5–1.9)

## 2017-11-17 MED ORDER — ADULT MULTIVITAMIN W/MINERALS CH
1.0000 | ORAL_TABLET | Freq: Every day | ORAL | Status: DC
  Administered 2017-11-18 – 2017-11-21 (×4): 1 via ORAL
  Filled 2017-11-17 (×4): qty 1

## 2017-11-17 MED ORDER — SODIUM CHLORIDE 0.9 % IV BOLUS
1000.0000 mL | Freq: Once | INTRAVENOUS | Status: AC
  Administered 2017-11-17: 1000 mL via INTRAVENOUS

## 2017-11-17 MED ORDER — POLYETHYLENE GLYCOL 3350 17 G PO PACK
17.0000 g | PACK | Freq: Every day | ORAL | Status: DC
  Administered 2017-11-18 – 2017-11-21 (×2): 17 g via ORAL
  Filled 2017-11-17 (×3): qty 1

## 2017-11-17 MED ORDER — SODIUM CHLORIDE 0.9% FLUSH: 10.0000 mL | INTRAVENOUS | Status: DC | PRN

## 2017-11-17 MED ORDER — SODIUM CHLORIDE 0.9% IV SOLUTION
Freq: Once | INTRAVENOUS | Status: AC
  Administered 2017-11-19: 10:00:00 via INTRAVENOUS

## 2017-11-17 MED ORDER — GABAPENTIN 400 MG PO CAPS
1200.0000 mg | ORAL_CAPSULE | Freq: Every day | ORAL | Status: DC
  Administered 2017-11-17 – 2017-11-21 (×6): 1200 mg via ORAL
  Filled 2017-11-17 (×6): qty 3

## 2017-11-17 MED ORDER — CHLORHEXIDINE GLUCONATE CLOTH 2 % EX PADS
6.0000 | MEDICATED_PAD | Freq: Every day | CUTANEOUS | Status: DC
  Administered 2017-11-18 – 2017-11-21 (×3): 6 via TOPICAL

## 2017-11-17 MED ORDER — ALPRAZOLAM 0.25 MG PO TABS
0.2500 mg | ORAL_TABLET | Freq: Three times a day (TID) | ORAL | Status: DC | PRN
  Administered 2017-11-18 – 2017-11-21 (×6): 0.25 mg via ORAL
  Filled 2017-11-17 (×7): qty 1

## 2017-11-17 MED ORDER — NOREPINEPHRINE 4 MG/250ML-% IV SOLN: 0.0000 ug/min | INTRAVENOUS | Status: DC

## 2017-11-17 MED ORDER — SENNOSIDES-DOCUSATE SODIUM 8.6-50 MG PO TABS
1.0000 | ORAL_TABLET | Freq: Two times a day (BID) | ORAL | Status: DC
  Administered 2017-11-18 – 2017-11-22 (×7): 1 via ORAL
  Filled 2017-11-17 (×7): qty 1

## 2017-11-17 MED ORDER — SODIUM CHLORIDE 0.9% FLUSH
10.0000 mL | Freq: Two times a day (BID) | INTRAVENOUS | Status: DC
  Administered 2017-11-17 – 2017-11-21 (×7): 10 mL

## 2017-11-17 MED ORDER — ORAL CARE MOUTH RINSE: 15.0000 mL | Freq: Two times a day (BID) | OROMUCOSAL | Status: DC

## 2017-11-17 MED ORDER — ENSURE ENLIVE PO LIQD: 237.0000 mL | Freq: Two times a day (BID) | ORAL | Status: DC

## 2017-11-17 MED ORDER — ALBUTEROL SULFATE (2.5 MG/3ML) 0.083% IN NEBU: 3.0000 mL | INHALATION_SOLUTION | Freq: Four times a day (QID) | RESPIRATORY_TRACT | Status: DC | PRN

## 2017-11-17 MED ORDER — GABAPENTIN 400 MG PO CAPS
400.0000 mg | ORAL_CAPSULE | Freq: Two times a day (BID) | ORAL | Status: DC
  Filled 2017-11-17: qty 1

## 2017-11-17 NOTE — Progress Notes (Signed)
11/17/2017 1:50 PM  I called [employer] and arranged for them to come out and apply the left sarmiento humerus brace.    Murvin Natal MD

## 2017-11-17 NOTE — Progress Notes (Signed)
Called regarding left pathologic humerus fracture.  Recommend a dedicated x-rays of the left humerus, and also coaptation splint versus Sarmiento bracing.  Is not clear what resources are available at [facility] to get an coaptation splint applied, so I have ordered a Sarmiento brace.  This can be applied for comfort, she may need surgical intervention down the line depending on treatment goals, she can follow-up with me or Dr. Aline Brochure within the next week or so.  Johnny Bridge, MD

## 2017-11-17 NOTE — Progress Notes (Signed)
PROGRESS NOTE    Adaley Kiene [name]  WUJ:[id number]  DOB: 06/03/1960  DOA: 11/16/2017 PCP: Javier Docker, MD  Brief Admission Hx: [name] is a 57 y.o. female with medical history significant of metastatic lung cancer, chronic pain syndrome, type 2 diabetes, hypertension, presents with hematuria.  Patient start started noticing hematuria about 2 days prior to admission.  Pt also had been complaining of left arm pain and found to have a pathologic fracture of left humerus.  MDM/Assessment & Plan:   1. UTI/cystitis - with gross hematuria - follow urine culture and sensitivity results.  For now continue aztreonam and supportive care.   2. Hypotension - Pt has no sepsis physiology.  She is asymptomatic.  She responds to IV fluid boluses. Continue to monitor closely and follow clinically.  I do not believe that she is septic.  Her lactic acid has been normal.   3. Pathological humerus fracture - I spoke with orthopedist Dr. Mardelle Matte and he is recommending dedicated xrays of the left humerus and he has ordered a Sarmiento brace to be applied for comfort.  Pt will need orthopedic follow up in 1 week with Dr. Mardelle Matte or Dr. Aline Brochure.  4. Low MCV Anemia - I suspect some acute blood loss from fracture - will type and screen and transfuse 2 units PRBC, recheck CBC in AM.   5. Type 2 diabetes mellitus - holding home metformin. Continue carb modified diet and sliding scale coverage.  6. AKI - Treating with hydration, avoiding nephrotoxins, repeat renal function panel in AM. Renal US pending.   7. Cerebrovascular disease s/p CVA - temporarily holding brilinta and aspirin due to acute blood loss anemia and hematuria.   8. Chronic pain and opioid dependence - resumed her home oxycodone as needed.  Family reports that patient has been misusing pain medications in the past.  Monitor.  9. Metastatic lung cancer - Pt had a core biopsy of lung 9/24 and pathology suggesting primary lung  adenocarcinoma - I have requested an oncology consultation.    DVT prophylaxis: SCDs Code Status: Full  Family Communication: daughter  Disposition Plan: inpatient  Severity of Illness: The appropriate patient status for this patient is INPATIENT. Inpatient status is judged to be reasonable and necessary in order to provide the required intensity of service to ensure the patient's safety. The patient's presenting symptoms, physical exam findings, and initial radiographic and laboratory data in the context of their chronic comorbidities is felt to place them at high risk for further clinical deterioration. Furthermore, it is not anticipated that the patient will be medically stable for discharge from the hospital within 2 midnights of admission. The following factors support the patient status of inpatient.   " The patient's presenting symptoms include hematuria, left arm pain. " The worrisome physical exam findings include hypotension . " The initial radiographic and laboratory data are worrisome because of anemia, left humerus fracture. " The chronic co-morbidities include diabetes, cva, opioid dependence, metastatic cancer.   * I certify that at the point of admission it is my clinical judgment that the patient will require inpatient hospital care spanning beyond 2 midnights from the point of admission due to high intensity of service, high risk for further deterioration and high frequency of surveillance required.*    Consultants:  Orthopedics  Oncology   Antimicrobials:  Aztreonam 9/26-  Subjective: Pt complains of nonspecific generalized pain and worsening left arm pain.    Objective: Vitals:   11/17/17 9562 11/17/17 0232  11/17/17 0613 11/17/17 0804  BP: 114/61  (!) 82/48 (!) 83/72  Pulse: 98 96 63 66  Resp: 20  18 16   Temp: (!) 100.6 F (38.1 C)  97.8 F (36.6 C) 98.6 F (37 C)  TempSrc: Oral  Oral Oral  SpO2: (!) 81% 93% (!) 87% 96%  Weight:  79.8 kg    Height:   5\' 8"  (1.727 m)      Intake/Output Summary (Last 24 hours) at 11/17/2017 1033 Last data filed at 11/17/2017 0919 Gross per 24 hour  Intake 3199.29 ml  Output -  Net 3199.29 ml   Filed Weights   11/16/17 1122 11/17/17 0232  Weight: 74.6 kg 79.8 kg     REVIEW OF SYSTEMS  As per history otherwise all reviewed and reported negative  Exam:  General exam: awake, alert, oriented x 3. NAD. Non cooperative for exam.  Respiratory system: Clear. No increased work of breathing. Cardiovascular system: S1 & S2 heard, RRR. No JVD, murmurs, gallops, clicks or pedal edema. Gastrointestinal system: Abdomen is nondistended, soft and nontender. Normal bowel sounds heard. Central nervous system: Alert and oriented. No focal neurological deficits. Extremities: left arm in sling, good pulses bilateral hands and upper extremities.  Data Reviewed: Basic Metabolic Panel: Recent Labs  Lab 11/16/17 1244 11/17/17 0614  NA 133* 136  K 3.3* 4.0  CL 95* 102  CO2 29 27  GLUCOSE 128* 101*  BUN 28* 24*  CREATININE 1.67* 1.51*  CALCIUM 9.4 8.8*   Liver Function Tests: Recent Labs  Lab 11/16/17 1244  AST 12*  ALT 7  ALKPHOS 74  BILITOT 0.4  PROT 7.6  ALBUMIN 2.7*   No results for input(s): LIPASE, AMYLASE in the last 168 hours. No results for input(s): AMMONIA in the last 168 hours. CBC: Recent Labs  Lab 11/14/17 1223 11/16/17 1244 11/16/17 2201 11/17/17 0614  WBC 12.6* 13.8* 11.6* 10.3  NEUTROABS  --  10.8  --   --   HGB 8.2* 8.6* 7.5* 7.1*  HCT 27.9* 28.1* 24.9* 23.4*  MCV 79.0 77.2* 77.8* 77.7*  PLT 487* 443* 431* 409*   Cardiac Enzymes: No results for input(s): CKTOTAL, CKMB, CKMBINDEX, TROPONINI in the last 168 hours. CBG (last 3)  Recent Labs    11/17/17 0006 11/17/17 0240 11/17/17 0756  GLUCAP 125* 127* 98   Recent Results (from the past 240 hour(s))  Culture, blood (Routine X 2) w Reflex to ID Panel     Status: None (Preliminary result)   Collection Time: 11/16/17   5:52 PM  Result Value Ref Range Status   Specimen Description BLOOD RIGHT HAND  Final   Special Requests   Final    BOTTLES DRAWN AEROBIC AND ANAEROBIC Blood Culture results may not be optimal due to an excessive volume of blood received in culture bottles   Culture   Final    NO GROWTH < 24 HOURS Performed at [facility], [address] [place], [place] [address]    Report Status PENDING  Incomplete  Culture, blood (Routine X 2) w Reflex to ID Panel     Status: None (Preliminary result)   Collection Time: 11/16/17  6:53 PM  Result Value Ref Range Status   Specimen Description BLOOD RIGHT HAND  Final   Special Requests   Final    BOTTLES DRAWN AEROBIC ONLY Blood Culture adequate volume   Culture   Final    NO GROWTH < 24 HOURS Performed at [facility], [address] [place],  [place] [address]    Report Status PENDING  Incomplete     Studies: Dg Chest 1 View  Result Date: 11/16/2017 CLINICAL DATA:  Lung cancer. Hematuria. Unable to lift her left arm. EXAM: CHEST  1 VIEW COMPARISON:  11/14/2017. PET-CT dated 11/03/2017. FINDINGS: Interval pathological fracture through the mid left humeral shaft in through and ill-defined lytic lesion with lateral angulation and mild lateral displacement of the distal fragment. Previously demonstrated left lower lobe mass and left axillary surgical clips. Clear right lung. Diffuse osteopenia. IMPRESSION: 1. Interval pathological fracture of the mid left humeral shaft at the location of a lytic metastasis. 2. Stable left lower lobe mass. Electronically Signed   By: Claudie Revering M.D.   On: 11/16/2017 16:42   Ct Abdomen Pelvis W Contrast  Result Date: 11/16/2017 CLINICAL DATA:  Recently diagnosed metastatic lung cancer. Hematuria for the past 2 days. EXAM: CT ABDOMEN AND PELVIS WITH CONTRAST TECHNIQUE: Multidetector CT imaging of the abdomen and pelvis was performed using the standard protocol following bolus administration of intravenous contrast.  CONTRAST:  58mL ISOVUE-300 IOPAMIDOL (ISOVUE-300) INJECTION 61% COMPARISON:  Abdomen and pelvis CT dated 10/25/2017 and PET-CT dated 13/2 1,019. FINDINGS: Lower chest: The pathological fracture of the mid left humeral shaft seen earlier today is again demonstrated on the scout image. Also again demonstrated is the previously demonstrated necrotic left lower lobe mass with an interval small left pleural effusion. Minimal atelectasis of the right lung base. Hepatobiliary: No focal liver abnormality is seen. No gallstones, gallbladder wall thickening, or biliary dilatation. Pancreas: Unremarkable. No pancreatic ductal dilatation or surrounding inflammatory changes. Spleen: Stable cystic lesions. Adrenals/Urinary Tract: Normal appearing adrenal glands. Mild increase in size and decrease in density of multiple heterogeneously hypodense masses in both kidneys. A simple parapelvic cyst in the inferior left kidney is unchanged. There is mildly increased perinephric soft tissue stranding on the right with interval mild perinephric soft tissue stranding on the left. There is also periureteric soft tissue stranding on the right, not previously present. Normal appearing urinary bladder and left ureter. No urinary tract calculi or hydronephrosis. Stomach/Bowel: Unremarkable stomach, small bowel and colon. No evidence of appendicitis. Vascular/Lymphatic: Atheromatous arterial calcifications without aneurysm. Mildly enlarged retrocaval lymph nodes at the level of the kidneys the largest has some central low density, similar to the lesions in the kidneys, with a short axis diameter of 10 mm on image number 34 series 2. Mildly prominent left para-aortic lymph nodes more inferiorly without abnormal enlargement. Reproductive: Status post hysterectomy. No adnexal masses. Other: Midline surgical scar. Musculoskeletal: Mild lumbar and lower thoracic spine degenerative changes. IMPRESSION: 1. Mild increase in size and decrease in density  of multiple bilateral renal masses, with mildly increased right perinephric soft tissue stranding and interval mild left perinephric soft tissue stranding. This combination of findings is most compatible with progressive bilateral pyelonephritis with intrarenal abscesses. Again malignancy, such as lymphoma or renal metastases, is also a possibility. 2. No urinary tract calculi or hydronephrosis. 3. Stable pathological fracture of the mid left humeral shaft. 4. Interval mild retroperitoneal adenopathy. This could be reactive due to the adjacent pyelonephritis or metastatic if the areas in the kidneys are malignant. 5. Previously demonstrated left lower lobe necrotic lung cancer with an interval small left pleural effusion. Electronically Signed   By: Claudie Revering M.D.   On: 11/16/2017 20:49   US Venous Img Upper Left (dvt Study)  Result Date: 11/16/2017 CLINICAL DATA:  57 year old female with left upper extremity swelling from the  shoulder to the hand for the past 2 days EXAM: LEFT UPPER EXTREMITY VENOUS DOPPLER ULTRASOUND TECHNIQUE: Gray-scale sonography with graded compression, as well as color Doppler and duplex ultrasound were performed to evaluate the upper extremity deep venous system from the level of the subclavian vein and including the jugular, axillary, basilic, radial, ulnar and upper cephalic vein. Spectral Doppler was utilized to evaluate flow at rest and with distal augmentation maneuvers. COMPARISON:  None. FINDINGS: Contralateral Subclavian Vein: Respiratory phasicity is normal and symmetric with the symptomatic side. No evidence of thrombus. Normal compressibility. Internal Jugular Vein: No evidence of thrombus. Normal compressibility, respiratory phasicity and response to augmentation. Subclavian Vein: No evidence of thrombus. Normal compressibility, respiratory phasicity and response to augmentation. Axillary Vein: Unable to image secondary to patient non tolerance. Cephalic Vein: No evidence  of thrombus. Normal compressibility, respiratory phasicity and response to augmentation. Basilic Vein: No evidence of thrombus. Normal compressibility, respiratory phasicity and response to augmentation. Brachial Veins: No evidence of thrombus. Normal compressibility, respiratory phasicity and response to augmentation. Radial Veins: No evidence of thrombus. Normal compressibility, respiratory phasicity and response to augmentation. Ulnar Veins: No evidence of thrombus. Normal compressibility, respiratory phasicity and response to augmentation. Venous Reflux:  None visualized. Other Findings:  None visualized. IMPRESSION: No evidence of DVT within the left upper extremity. Electronically Signed   By: Jacqulynn Cadet M.D.   On: 11/16/2017 17:25   Scheduled Meds: . sodium chloride   Intravenous Once  . atorvastatin  10 mg Oral q morning - 10a  . feeding supplement (ENSURE ENLIVE)  237 mL Oral BID BM  . gabapentin  1,200 mg Oral QHS  . insulin aspart  0-9 Units Subcutaneous TID WC  . loratadine  10 mg Oral Daily  . mouth rinse  15 mL Mouth Rinse BID  . montelukast  10 mg Oral QHS   Continuous Infusions: . sodium chloride Stopped (11/17/17 0100)  . sodium chloride Stopped (11/17/17 0916)  . sodium chloride    . aztreonam 1 g (11/17/17 0556)    Principal Problem:   Hypotension Active Problems:   Chronic pain syndrome   Diabetes mellitus type 2 in obese [facility])   Renal insufficiency   Hypokalemia   Lung cancer, primary, with metastasis from lung to other site, left [facility])   Hematuria   Anemia   Pathological fracture of left humerus  Time spent:   Irwin Brakeman, MD, FAAFP [facility] Pager [phone]  If 7PM-7AM, please contact night-coverage [email or web address] Password [email or web address] 11/17/2017, 10:33 AM    LOS: 1 day

## 2017-11-17 NOTE — Progress Notes (Signed)
Initial Nutrition Assessment  DOCUMENTATION CODES:  Not applicable  INTERVENTION:  Pt refuses ensure/glucerna. She agreed to magic cup.  Will order Magic cup BID with meals, each supplement provides 290 kcal and 9 grams of protein  No bm x3 days. Pt reports having to take OTC laxatives/softeners in order to have a BM at home. Would consider placing patient on bowel regimen.   Pt admits to food insecurity- "I cant afford to eat 3x a day". Will place a SW consult.   MVI with minerals  Will benefit from diet education if/when appropriate   NUTRITION DIAGNOSIS:  Increased nutrient needs related to acute illness (uti/cysitis) and underlying hypermetabolic process as evidenced by the combined nutritional requirements for these conditions.   GOAL:  Patient will meet greater than or equal to 90% of their needs  MONITOR:  PO intake, Supplement acceptance, Labs, Weight trends, I & O's  REASON FOR ASSESSMENT:  Malnutrition Screening Tool    ASSESSMENT:  57 y/o female PMHx chronic pain, polysubstance abuse, CVA, HTN/HLD, COPD, DM2, Breast cancer. Had been admitted 9/4-9/6  For pyelonephritis. At that time also discovered to have metastatic lung cancer. Represents with hematuria and L arm pain. Found to have L humerus fx and hypotension. Admitted for management.  Patient emotional on RD visit. She says she is "trying to hang in there".   She says her normal eating patterns and behaviors are as follows. She lives with her boyfriend 20+ years. She says she is able to cook and shop on her own. She says she follows a diabetic diet- "I dont eat sugar at all and I dont eat bread". However, she later notes she eats a diet extremely high in fruit. She also is drinking a Reg Mt dew and has 5+ cracker wrappers on her tray. May benefit from diet education when more appropriate. She does not take any vitamins or supplements. She asks for vitamin recommendations.   RD asked if she has had a good appetite  the last few months. She says she has. RD asked if that meant she eats 3x a day and she says "oh no, not that well, I cant afford to eat 3 meals a day". Finances are obviously an issue. She says her SO, also Hispanic, has worked on the same farm for 20 years and has never made more than 7 dollars/hr.   Pt says her UBW is "160 something". Per chart the patient was 169 lbs when she presented on 9/4 and was 164.5 lbs at OP oncology appt  9/16. More long term, was 179 lbs in April and 185 lbs at this time last year. She appears to have presented this admission at a weight of  176 lbs, but she exhibits severe BLE edema, R>L.   Physical exam significant for angular stomatitis   At this time, pt says she does not want to drink the ensure or glucerna. RD reviewed importance of nutrition in recovery. She agreed to Magic cup. She was intermittently tearful during conversation. She noted she feels she is not provided a choice for aspects of her care. We reviewed she has the right to refuse, but she should fully understand the risks/complications of doing so. Provided supportive listening. She seemed to feel better after.   Labs: Albumin: 2.7 on admit, WBC:13.8, Hgb:8.6, Bgs 100-130. BUN/Creat:24/1.51-improving w/ IVF Meds: Ensure Enlive (refuse x1), Insulin, IVF, IV abx.  Vasopressor Support-ordered, but not running as of RD encounter.   Recent Labs  Lab 11/16/17 1244 11/17/17  3299  NA 133* 136  K 3.3* 4.0  CL 95* 102  CO2 29 27  BUN 28* 24*  CREATININE 1.67* 1.51*  CALCIUM 9.4 8.8*  GLUCOSE 128* 101*   NUTRITION - FOCUSED PHYSICAL EXAM:   Most Recent Value  Orbital Region  No depletion  Upper Arm Region  No depletion  Thoracic and Lumbar Region  No depletion  Buccal Region  No depletion  Temple Region  No depletion  Clavicle Bone Region  Mild depletion  Clavicle and Acromion Bone Region  No depletion  Scapular Bone Region  No depletion  Dorsal Hand  No depletion  Patellar Region  No  depletion  Anterior Thigh Region  No depletion  Posterior Calf Region  No depletion  Edema (RD Assessment)  Severe  Hair  Reviewed  Eyes  Reviewed  Mouth  Reviewed [Angular stomatitis]  Skin  Reviewed  Nails  Reviewed     Diet Order:   Diet Order            Diet Carb Modified Fluid consistency: Thin; Room service appropriate? Yes  Diet effective now             EDUCATION NEEDS:  Not appropriate for education at this time  Skin:  Skin Assessment: Reviewed RN Assessment  Last BM:  9/24  Height:  Ht Readings from Last 1 Encounters:  11/17/17 5\' 8"  (1.727 m)   Weight:  Wt Readings from Last 1 Encounters:  11/17/17 79.8 kg   Wt Readings from Last 10 Encounters:  11/17/17 79.8 kg  11/06/17 74.6 kg  10/25/17 77 kg  05/31/17 81.2 kg  03/20/17 85.8 kg  12/06/16 85.4 kg  11/17/16 83.9 kg  10/12/16 94.3 kg  10/09/16 90.7 kg  07/02/15 99.3 kg   Ideal Body Weight:  63.64 kg  BMI:  Body mass index is 26.75 kg/m.  Pt w/ severe BLE. Will use weight from 9/16 encounter for dosing.  Estimated Nutritional Needs:  Kcal:  1950-2150 kcals (26-29 kcal/kg dry weight) Protein:  90-105g (1.2-1.4g/kg dry weight)  Fluid:  Per MD goals  Burtis Junes RD, LDN, CNSC Clinical Nutrition Available Tues-Sat via Pager: [phone] 11/17/2017 3:29 PM

## 2017-11-17 NOTE — Progress Notes (Signed)
Patient BP 85/54 this morning after saline 1 liter bolus administered as ordered. Patient drowsy but responsive. States she "feels bad and arm hurts". Oxycodone 30 mg po ordered PRN, not given at this time due to low BP and drowsiness. Discussed with patient. Text-paged Dr. Wynetta Emery to notify. Order placed for transfer to stepdown. Donavan Foil, RN

## 2017-11-17 NOTE — Progress Notes (Signed)
Patient transferred to ICU/stepdown rm 4 via bed accompanied by nurses and nurse tech. Received by ICU nursing staff at bedside. Caryl Pina Cia Garretson,RN

## 2017-11-17 NOTE — Progress Notes (Signed)
Patient to transfer to [facility] unit Rm ICU 4. Called report to Leonette Most, RN. Notified patient's sister Beatrice Lecher at patient's request. Donavan Foil, RN

## 2017-11-17 NOTE — Progress Notes (Signed)
Orthopedic brace placed on left humerus area of arm by Paramedic.  Patient tolerated process well.

## 2017-11-17 NOTE — Progress Notes (Signed)
Peripherally Inserted Central Catheter/Midline Placement  The IV Nurse has discussed with the patient and/or persons authorized to consent for the patient, the purpose of this procedure and the potential benefits and risks involved with this procedure.  The benefits include less needle sticks, lab draws from the catheter, and the patient may be discharged home with the catheter. Risks include, but not limited to, infection, bleeding, blood clot (thrombus formation), and puncture of an artery; nerve damage and irregular heartbeat and possibility to perform a PICC exchange if needed/ordered by physician.  Alternatives to this procedure were also discussed.  Bard Power PICC patient education guide, fact sheet on infection prevention and patient information card has been provided to patient /or left at bedside.    PICC/Midline Placement Documentation  PICC Double Lumen 00/37/04 PICC Right Basilic 44 cm 1 cm (Active)  Indication for Insertion or Continuance of Line Poor Vasculature-patient has had multiple peripheral attempts or PIVs lasting less than 24 hours 11/17/2017  6:00 PM  Exposed Catheter (cm) 0 cm 11/17/2017  6:00 PM  Site Assessment Clean;Dry;Intact 11/17/2017  6:00 PM  Lumen #1 Status Flushed;Blood return noted 11/17/2017  6:00 PM  Lumen #2 Status Flushed;Blood return noted 11/17/2017  6:00 PM  Dressing Type Transparent 11/17/2017  6:00 PM  Dressing Status Clean;Dry;Intact;Antimicrobial disc in place 11/17/2017  6:00 PM  Dressing Intervention New dressing 11/17/2017  6:00 PM  Dressing Change Due 11/24/17 11/17/2017  6:00 PM       Jule Economy Horton 11/17/2017, 6:20 PM

## 2017-11-17 NOTE — Progress Notes (Signed)
Bladder scan completed, volume 61 ml. Patient reports she has voided since admission, unsure of amount. BP noted to be low this am. Rechecked BP 83/72. Notified Dr. Wynetta Emery. States he will order fluid bolus. Donavan Foil, RN

## 2017-11-18 ENCOUNTER — Inpatient Hospital Stay (HOSPITAL_COMMUNITY): Payer: Medicaid Other

## 2017-11-18 DIAGNOSIS — G894 Chronic pain syndrome: Secondary | ICD-10-CM

## 2017-11-18 DIAGNOSIS — D649 Anemia, unspecified: Secondary | ICD-10-CM

## 2017-11-18 DIAGNOSIS — R319 Hematuria, unspecified: Secondary | ICD-10-CM

## 2017-11-18 DIAGNOSIS — E669 Obesity, unspecified: Secondary | ICD-10-CM

## 2017-11-18 DIAGNOSIS — E1169 Type 2 diabetes mellitus with other specified complication: Secondary | ICD-10-CM

## 2017-11-18 DIAGNOSIS — M84522A Pathological fracture in neoplastic disease, left humerus, initial encounter for fracture: Secondary | ICD-10-CM

## 2017-11-18 DIAGNOSIS — C3492 Malignant neoplasm of unspecified part of left bronchus or lung: Secondary | ICD-10-CM

## 2017-11-18 DIAGNOSIS — I959 Hypotension, unspecified: Secondary | ICD-10-CM

## 2017-11-18 LAB — COMPREHENSIVE METABOLIC PANEL
ALBUMIN: 2 g/dL — AB (ref 3.5–5.0)
ALK PHOS: 61 U/L (ref 38–126)
ALT: 9 U/L (ref 0–44)
ANION GAP: 6 (ref 5–15)
AST: 13 U/L — ABNORMAL LOW (ref 15–41)
BUN: 23 mg/dL — ABNORMAL HIGH (ref 6–20)
CALCIUM: 8.6 mg/dL — AB (ref 8.9–10.3)
CHLORIDE: 106 mmol/L (ref 98–111)
CO2: 27 mmol/L (ref 22–32)
Creatinine, Ser: 1.34 mg/dL — ABNORMAL HIGH (ref 0.44–1.00)
GFR calc non Af Amer: 43 mL/min — ABNORMAL LOW (ref >60)
GFR, EST AFRICAN AMERICAN: 50 mL/min — AB (ref >60)
GLUCOSE: 107 mg/dL — AB (ref 70–99)
POTASSIUM: 3.9 mmol/L (ref 3.5–5.1)
SODIUM: 139 mmol/L (ref 135–145)
Total Bilirubin: 0.4 mg/dL (ref 0.3–1.2)
Total Protein: 5.8 g/dL — ABNORMAL LOW (ref 6.5–8.1)

## 2017-11-18 LAB — URINE CULTURE

## 2017-11-18 LAB — TYPE AND SCREEN
ABO/RH(D): O POS
ANTIBODY SCREEN: NEGATIVE
UNIT DIVISION: 0
Unit division: 0

## 2017-11-18 LAB — CBC
HEMATOCRIT: 26.4 % — AB (ref 36.0–46.0)
HEMOGLOBIN: 7.9 g/dL — AB (ref 12.0–15.0)
MCH: 23.9 pg — AB (ref 26.0–34.0)
MCHC: 29.9 g/dL — ABNORMAL LOW (ref 30.0–36.0)
MCV: 79.8 fL (ref 78.0–100.0)
Platelets: 363 10*3/uL (ref 150–400)
RBC: 3.31 MIL/uL — ABNORMAL LOW (ref 3.87–5.11)
RDW: 20 % — ABNORMAL HIGH (ref 11.5–15.5)
WBC: 9.3 10*3/uL (ref 4.0–10.5)

## 2017-11-18 LAB — BPAM RBC
BLOOD PRODUCT EXPIRATION DATE: 201910072359
Blood Product Expiration Date: 201910022359
ISSUE DATE / TIME: 201909271854
ISSUE DATE / TIME: 201909272131
UNIT TYPE AND RH: 5100
Unit Type and Rh: 5100

## 2017-11-18 LAB — GLUCOSE, CAPILLARY
GLUCOSE-CAPILLARY: 122 mg/dL — AB (ref 70–99)
GLUCOSE-CAPILLARY: 116 mg/dL — AB (ref 70–99)
Glucose-Capillary: 117 mg/dL — ABNORMAL HIGH (ref 70–99)
Glucose-Capillary: 138 mg/dL — ABNORMAL HIGH (ref 70–99)

## 2017-11-18 NOTE — Progress Notes (Signed)
PROGRESS NOTE    [name]  SNK:[id number]  DOB: [birth year]  DOA: 11/16/2017 PCP: Javier Docker, MD  Brief Admission Hx: [name] is a 57 y.o. female with medical history significant of metastatic lung cancer, chronic pain syndrome, type 2 diabetes, hypertension, presents with hematuria.  Patient start started noticing hematuria about 2 days prior to admission.  Pt also had been complaining of left arm pain and found to have a pathologic fracture of left humerus.  MDM/Assessment & Plan:   1. UTI/cystitis - with gross hematuria -currently on intravenous antibiotics.  Urine culture in process.  Still having some degree of hematuria. 2. Hypotension - Pt has no sepsis physiology.  She is asymptomatic.  She responds to IV fluid boluses. Continue to monitor closely and follow clinically.  I do not believe that she is septic.  Her lactic acid has been normal.   3. Pathological humerus fracture -Dr. Wynetta Emery spoke with orthopedist Dr. Mardelle Matte and he is recommending dedicated xrays of the left humerus and he has ordered a Sarmiento brace to be applied for comfort.  Pt will need orthopedic follow up in 1 week with Dr. Mardelle Matte or Dr. Aline Brochure.  4. Microcytic anemia -likely related to hematuria.  Patient transfused 2 units of PRBC yesterday with mild improvement of hemoglobin.  Needs continued monitoring..   5. Type 2 diabetes mellitus - holding home metformin. Continue carb modified diet and sliding scale coverage.  Blood sugars stable 6. AKI - Treating with hydration, avoiding nephrotoxins.  Overall creatinine is improving. Renal US does not show any hydronephrosis 7. Cerebrovascular disease s/p CVA - temporarily holding brilinta and aspirin due to acute blood loss anemia and hematuria.   8. Chronic pain and opioid dependence - resumed her home oxycodone as needed.  Family reports that patient has been misusing pain medications in the past.  Monitor.  9. Metastatic lung  cancer - Pt had a core biopsy of lung 9/24 and pathology suggesting primary lung adenocarcinoma. She has already established care with cancer center at [facility].    DVT prophylaxis: SCDs Code Status: Full  Family Communication: daughter  Disposition Plan: inpatient   Consultants:  Orthopedics  Oncology   Antimicrobials:  Aztreonam 9/26-  Subjective: Reports that pain is reasonably controlled.  Denies any shortness of breath.  Objective: Vitals:   11/18/17 1400 11/18/17 1500 11/18/17 1600 11/18/17 1700  BP: (!) 97/51 (!) 101/57 (!) 97/56 (!) 89/54  Pulse: 76 90 87 78  Resp: 16 20 19 14   Temp:      TempSrc:      SpO2: 91% (!) 87% (!) 86% (!) 88%  Weight:      Height:        Intake/Output Summary (Last 24 hours) at 11/18/2017 1752 Last data filed at 11/18/2017 1700 Gross per 24 hour  Intake 5215.18 ml  Output 750 ml  Net 4465.18 ml   Filed Weights   11/16/17 1122 11/17/17 0232 11/18/17 0400  Weight: 74.6 kg 79.8 kg 82.7 kg     REVIEW OF SYSTEMS  As per history otherwise all reviewed and reported negative  Exam:  General exam: Alert, awake, oriented x 3 Respiratory system: Clear to auscultation. Respiratory effort normal. Cardiovascular system:RRR. No murmurs, rubs, gallops. Gastrointestinal system: Abdomen is nondistended, soft and nontender. No organomegaly or masses felt. Normal bowel sounds heard. Central nervous system: Alert and oriented. No focal neurological deficits. Extremities: Left upper extremities in brace, bilateral 1+ lower extremity edema Skin: No  rashes, lesions or ulcers Psychiatry: Judgement and insight appear normal. Mood & affect appropriate.    Data Reviewed: Basic Metabolic Panel: Recent Labs  Lab 11/16/17 1244 11/17/17 0614 11/18/17 0426  NA 133* 136 139  K 3.3* 4.0 3.9  CL 95* 102 106  CO2 29 27 27   GLUCOSE 128* 101* 107*  BUN 28* 24* 23*  CREATININE 1.67* 1.51* 1.34*  CALCIUM 9.4 8.8* 8.6*   Liver  Function Tests: Recent Labs  Lab 11/16/17 1244 11/18/17 0426  AST 12* 13*  ALT 7 9  ALKPHOS 74 61  BILITOT 0.4 0.4  PROT 7.6 5.8*  ALBUMIN 2.7* 2.0*   No results for input(s): LIPASE, AMYLASE in the last 168 hours. No results for input(s): AMMONIA in the last 168 hours. CBC: Recent Labs  Lab 11/14/17 1223 11/16/17 1244 11/16/17 2201 11/17/17 0614 11/18/17 0426  WBC 12.6* 13.8* 11.6* 10.3 9.3  NEUTROABS  --  10.8  --   --   --   HGB 8.2* 8.6* 7.5* 7.1* 7.9*  HCT 27.9* 28.1* 24.9* 23.4* 26.4*  MCV 79.0 77.2* 77.8* 77.7* 79.8  PLT 487* 443* 431* 409* 363   Cardiac Enzymes: No results for input(s): CKTOTAL, CKMB, CKMBINDEX, TROPONINI in the last 168 hours. CBG (last 3)  Recent Labs    11/18/17 0733 11/18/17 1104 11/18/17 1634  GLUCAP 122* 116* 117*   Recent Results (from the past 240 hour(s))  Culture, blood (Routine X 2) w Reflex to ID Panel     Status: None (Preliminary result)   Collection Time: 11/16/17  5:52 PM  Result Value Ref Range Status   Specimen Description BLOOD RIGHT HAND  Final   Special Requests   Final    BOTTLES DRAWN AEROBIC AND ANAEROBIC Blood Culture results may not be optimal due to an excessive volume of blood received in culture bottles   Culture   Final    NO GROWTH 2 DAYS Performed at [facility], [address] [place], [place] [address]    Report Status PENDING  Incomplete  Culture, blood (Routine X 2) w Reflex to ID Panel     Status: None (Preliminary result)   Collection Time: 11/16/17  6:53 PM  Result Value Ref Range Status   Specimen Description BLOOD RIGHT HAND  Final   Special Requests   Final    BOTTLES DRAWN AEROBIC ONLY Blood Culture adequate volume   Culture   Final    NO GROWTH 2 DAYS Performed at [facility], [address], [place], [place] [address]    Report Status PENDING  Incomplete  Urine culture     Status: Abnormal   Collection Time: 11/16/17 11:53 PM  Result Value Ref Range Status   Specimen Description    Final    URINE, CLEAN CATCH Performed at [facility], [address] [place], [place] [address]    Special Requests   Final    NONE Performed at [facility], [address] [place], [place] [address]    Culture (A)  Final    <10,000 COLONIES/mL INSIGNIFICANT GROWTH Performed at [facility], [place] [address] [place],  [address]    Report Status 11/18/2017 FINAL  Final     Studies: Ct Abdomen Pelvis W Contrast  Result Date: 11/16/2017 CLINICAL DATA:  Recently diagnosed metastatic lung cancer. Hematuria for the past 2 days. EXAM: CT ABDOMEN AND PELVIS WITH CONTRAST TECHNIQUE: Multidetector CT imaging of the abdomen and pelvis was performed using the standard protocol following  bolus administration of intravenous contrast. CONTRAST:  50mL ISOVUE-300 IOPAMIDOL (ISOVUE-300) INJECTION 61% COMPARISON:  Abdomen and pelvis CT dated 10/25/2017 and PET-CT dated 13/2 1,019. FINDINGS: Lower chest: The pathological fracture of the mid left humeral shaft seen earlier today is again demonstrated on the scout image. Also again demonstrated is the previously demonstrated necrotic left lower lobe mass with an interval small left pleural effusion. Minimal atelectasis of the right lung base. Hepatobiliary: No focal liver abnormality is seen. No gallstones, gallbladder wall thickening, or biliary dilatation. Pancreas: Unremarkable. No pancreatic ductal dilatation or surrounding inflammatory changes. Spleen: Stable cystic lesions. Adrenals/Urinary Tract: Normal appearing adrenal glands. Mild increase in size and decrease in density of multiple heterogeneously hypodense masses in both kidneys. A simple parapelvic cyst in the inferior left kidney is unchanged. There is mildly increased perinephric soft tissue stranding on the right with interval mild perinephric soft tissue stranding on the left. There is also periureteric soft tissue stranding on the right, not previously present. Normal appearing urinary  bladder and left ureter. No urinary tract calculi or hydronephrosis. Stomach/Bowel: Unremarkable stomach, small bowel and colon. No evidence of appendicitis. Vascular/Lymphatic: Atheromatous arterial calcifications without aneurysm. Mildly enlarged retrocaval lymph nodes at the level of the kidneys the largest has some central low density, similar to the lesions in the kidneys, with a short axis diameter of 10 mm on image number 34 series 2. Mildly prominent left para-aortic lymph nodes more inferiorly without abnormal enlargement. Reproductive: Status post hysterectomy. No adnexal masses. Other: Midline surgical scar. Musculoskeletal: Mild lumbar and lower thoracic spine degenerative changes. IMPRESSION: 1. Mild increase in size and decrease in density of multiple bilateral renal masses, with mildly increased right perinephric soft tissue stranding and interval mild left perinephric soft tissue stranding. This combination of findings is most compatible with progressive bilateral pyelonephritis with intrarenal abscesses. Again malignancy, such as lymphoma or renal metastases, is also a possibility. 2. No urinary tract calculi or hydronephrosis. 3. Stable pathological fracture of the mid left humeral shaft. 4. Interval mild retroperitoneal adenopathy. This could be reactive due to the adjacent pyelonephritis or metastatic if the areas in the kidneys are malignant. 5. Previously demonstrated left lower lobe necrotic lung cancer with an interval small left pleural effusion. Electronically Signed   By: Claudie Revering M.D.   On: 11/16/2017 20:49   US Renal  Result Date: 11/17/2017 CLINICAL DATA:  Hydronephrosis EXAM: RENAL / URINARY TRACT ULTRASOUND COMPLETE COMPARISON:  11/16/2017 FINDINGS: Right Kidney: Length: 11.1 cm. There are multiple somewhat hypoechoic areas identified distributed throughout the right kidney which correspond to the hypodense areas seen on recent CT examination. The largest of these measures 3.9  cm. No hydronephrosis is noted. No calculi are seen. Left Kidney: Length: 11.0 cm. Hypoechoic areas are noted in the upper pole of the left kidney also similar to that seen on recent CT examination. A cyst is identified inferiorly measuring 2.2 cm. No hydronephrosis is noted. Bladder: Appears normal for degree of bladder distention. IMPRESSION: Hypoechoic areas bilaterally which correspond to that seen on recent CT examination. These again likely represent areas of focal pyelonephritis. Cyst is noted in the lower pole of the left kidney. No hydronephrotic changes are noted. Electronically Signed   By: Inez Catalina M.D.   On: 11/17/2017 13:38   Dg Chest Port 1 View  Result Date: 11/18/2017 CLINICAL DATA:  Sepsis. EXAM: PORTABLE CHEST 1 VIEW COMPARISON:  11/17/2017 and CT chest 10/25/2017. FINDINGS: Right PICC tip projects over the SVC RA junction.  Trachea is midline. Heart size stable. Left lower lobe mass is better seen on 11/03/2017. Layering left pleural effusion. Right lung is grossly clear. IMPRESSION: Left lower lobe mass is obscured by layering left pleural fluid and left basilar collapse/consolidation. Electronically Signed   By: Lorin Picket M.D.   On: 11/18/2017 07:33   Dg Chest Port 1 View  Result Date: 11/17/2017 CLINICAL DATA:  Sepsis. History of lung cancer. EXAM: PORTABLE CHEST 1 VIEW COMPARISON:  Yesterday. FINDINGS: Interval patchy opacity throughout the left lung with sparing of the apex. Small amount of left pleural thickening or fluid without significant change. Increased prominence of the pulmonary vasculature. The right lung remains clear. Stable mildly enlarged cardiac silhouette. No pleural fluid on the right. Mild right AC joint degenerative changes. IMPRESSION: 1. Interval patchy opacity throughout the majority of the left lung, suspicious for pneumonia. 2. Interval mild pulmonary vascular congestion with stable mild cardiomegaly. 3. No significant change in a small amount of left  pleural thickening or fluid. Electronically Signed   By: Claudie Revering M.D.   On: 11/17/2017 14:06   Dg Humerus Left  Result Date: 11/17/2017 CLINICAL DATA:  Fracture. Pt in a lot of pain and is in a sling. She would not let be move her arm. EXAM: LEFT HUMERUS - 2+ VIEW COMPARISON:  Head CT 11/03/2017 FINDINGS: There is an acute fracture of the mid LEFT humerus, associated with mild angulation and less than 1 centimeter shaft width displacement. The bone in this region is irregular and low attenuation, consistent with metastatic disease and pathologic fracture. No evidence for dislocation of the glenohumeral joint. Opacity at the LEFT lung base again noted. IMPRESSION: Pathologic fracture of the mid LEFT humerus. Electronically Signed   By: Nolon Nations M.D.   On: 11/17/2017 11:09   Scheduled Meds: . sodium chloride   Intravenous Once  . atorvastatin  10 mg Oral q morning - 10a  . Chlorhexidine Gluconate Cloth  6 each Topical Daily  . gabapentin  1,200 mg Oral QHS  . insulin aspart  0-9 Units Subcutaneous TID WC  . loratadine  10 mg Oral Daily  . mouth rinse  15 mL Mouth Rinse BID  . montelukast  10 mg Oral QHS  . multivitamin with minerals  1 tablet Oral Daily  . polyethylene glycol  17 g Oral Daily  . senna-docusate  1 tablet Oral BID  . sodium chloride flush  10-40 mL Intracatheter Q12H   Continuous Infusions: . sodium chloride 150 mL/hr at 11/18/17 1700  . sodium chloride 20 mL/hr at 11/18/17 1700  . aztreonam Stopped (11/18/17 1434)  . norepinephrine (LEVOPHED) Adult infusion      Principal Problem:   Hypotension Active Problems:   Chronic pain syndrome   Diabetes mellitus type 2 in obese [facility])   Renal insufficiency   Hypokalemia   Lung cancer, primary, with metastasis from lung to other site, left (HCC)   Hematuria   Anemia   Pathological fracture of left humerus   Chronic constipation  Time spent: 25mins  Kathie Dike, MD [facility] Pager [phone]  [phone]  If 7PM-7AM, please contact night-coverage [email or web address] Password [email or web address] 11/18/2017, 5:52 PM    LOS: 2 days

## 2017-11-19 DIAGNOSIS — R600 Localized edema: Secondary | ICD-10-CM

## 2017-11-19 LAB — GLUCOSE, CAPILLARY
GLUCOSE-CAPILLARY: 111 mg/dL — AB (ref 70–99)
Glucose-Capillary: 109 mg/dL — ABNORMAL HIGH (ref 70–99)
Glucose-Capillary: 121 mg/dL — ABNORMAL HIGH (ref 70–99)
Glucose-Capillary: 103 mg/dL — ABNORMAL HIGH (ref 70–99)

## 2017-11-19 LAB — BASIC METABOLIC PANEL
Anion gap: 4 — ABNORMAL LOW (ref 5–15)
BUN: 19 mg/dL (ref 6–20)
CHLORIDE: 109 mmol/L (ref 98–111)
CO2: 25 mmol/L (ref 22–32)
CREATININE: 1.18 mg/dL — AB (ref 0.44–1.00)
Calcium: 8.3 mg/dL — ABNORMAL LOW (ref 8.9–10.3)
GFR calc non Af Amer: 51 mL/min — ABNORMAL LOW (ref >60)
GFR, EST AFRICAN AMERICAN: 59 mL/min — AB (ref >60)
Glucose, Bld: 104 mg/dL — ABNORMAL HIGH (ref 70–99)
Potassium: 3.9 mmol/L (ref 3.5–5.1)
SODIUM: 138 mmol/L (ref 135–145)

## 2017-11-19 LAB — CBC
HCT: 25 % — ABNORMAL LOW (ref 36.0–46.0)
Hemoglobin: 7.6 g/dL — ABNORMAL LOW (ref 12.0–15.0)
MCH: 24.2 pg — ABNORMAL LOW (ref 26.0–34.0)
MCHC: 30.4 g/dL (ref 30.0–36.0)
MCV: 79.6 fL (ref 78.0–100.0)
PLATELETS: 371 10*3/uL (ref 150–400)
RBC: 3.14 MIL/uL — AB (ref 3.87–5.11)
RDW: 20.4 % — ABNORMAL HIGH (ref 11.5–15.5)
WBC: 10.3 10*3/uL (ref 4.0–10.5)

## 2017-11-19 MED ORDER — BISACODYL 10 MG RE SUPP
10.0000 mg | Freq: Once | RECTAL | Status: AC
  Administered 2017-11-19: 10 mg via RECTAL
  Filled 2017-11-19: qty 1

## 2017-11-19 NOTE — Plan of Care (Signed)
  Problem: Acute Rehab PT Goals(only PT should resolve) Goal: Pt Will Go Supine/Side To Sit Outcome: Progressing Flowsheets (Taken 11/19/2017 1234) Pt will go Supine/Side to Sit: with min guard assist Goal: Patient Will Transfer Sit To/From Stand Outcome: Progressing Flowsheets (Taken 11/19/2017 1234) Patient will transfer sit to/from stand: with supervision Goal: Pt Will Transfer Bed To Chair/Chair To Bed Outcome: Progressing Flowsheets (Taken 11/19/2017 1234) Pt will Transfer Bed to Chair/Chair to Bed: with supervision Goal: Pt Will Ambulate Outcome: Progressing Flowsheets (Taken 11/19/2017 1234) Pt will Ambulate: 50 feet; with min guard assist; with cane   12:34 PM, 11/19/17 Lonell Grandchild, MPT Physical Therapist with [facility] [phone] office [phone] mobile phone

## 2017-11-19 NOTE — Evaluation (Signed)
Physical Therapy Evaluation Patient Details Name: [name] MRN: [id number] DOB: [birth year] Today's Date: 11/19/2017   History of Present Illness  [name] is a 57 y.o. female with medical history significant of metastatic lung cancer, chronic pain syndrome, type 2 diabetes, hypertension, presents with hematuria.  Patient start started noticing hematuria about 2 days ago.  She denies any fever.  Not noticed any change in her urine output.  Of note patient has a history of stroke about a year ago and she is on Brilinta and aspirin.  Patient was diagnosed with lung cancer in September of this year.  She had a lung biopsy done on September 24 of this year.  That day leaving that appointment her daughter helped her get in the car and acute pain of the left arm.  sHe states she had discomfort in the arm even before her diagnosis as well.  Today she was found to have a pathologic fracture of that arm.*PET scan  9/13 showed focal area of intense uptake within the soft tissues of the left upper arm.  She also had bilateral kidney masses which appear low-attenuation increased FDG uptake concerning for bilateral kidney metastasis if urinary tract infection findings were absent.  Her urinalysis showed a few bacteria, trace leuk, large amount of hemoglobin and greater than 50 RBCs.  However did appear contaminated with 21-50 squamous cells and WBC clumps.  Patient was hypotensive upon presentation to the ED.  She is has not been tachycardic, she is afebrile. Her LA was wnl x 2.  After IV fluids ,her blood pressure has increased, most recent to be in the 1 teens.  At baseline she is hypertensive on 3 medications including ARB and diuretic.  Recently admitted  65 /4 for pyelonephritis however her urine cultures were negative.    Clinical Impression  Patient c/o severe pain with any pressure to LUE, requested head of bed raised and pulled self to sitting with help, limited to a few steps at  bedside to transfer to [facility] and later chair, decline to attempting walking out of room due to LUE pain.  Patient tolerated sitting up in chair after therapy.  Patient will benefit from continued physical therapy in hospital and recommended venue below to increase strength, balance, endurance for safe ADLs and gait.    Follow Up Recommendations Home health PT;Supervision/Assistance - 24 hour;Supervision for mobility/OOB(Patient declining to go to a SNF)    Equipment Recommendations  Cane    Recommendations for Other Services       Precautions / Restrictions Precautions Precautions: Fall Restrictions Weight Bearing Restrictions: No      Mobility  Bed Mobility Overal bed mobility: Needs Assistance Bed Mobility: Supine to Sit     Supine to sit: Min assist     General bed mobility comments: requested head of bed raised, able to pull self up with RUE  Transfers Overall transfer level: Needs assistance Equipment used: 1 person hand held assist Transfers: Sit to/from [facility] Sit to Stand: Min assist Stand pivot transfers: Min assist       General transfer comment: labored movement for transferring to [facility] and chair  Ambulation/Gait Ambulation/Gait assistance: Min assist Gait Distance (Feet): 5 Feet Assistive device: 1 person hand held assist Gait Pattern/deviations: Decreased step length - left;Decreased stance time - right;Decreased stride length Gait velocity: slow   General Gait Details: limited to 5-6 slightly unsteady steps due to c/o severe pain LUE  Stairs  Wheelchair Mobility    Modified Rankin (Stroke Patients Only)       Balance Overall balance assessment: Needs assistance Sitting-balance support: Feet supported;No upper extremity supported Sitting balance-Leahy Scale: Good     Standing balance support: Single extremity supported;During functional activity Standing balance-Leahy Scale: Fair Standing balance comment:  hand held assist                             Pertinent Vitals/Pain Pain Assessment: 0-10 Pain Score: 10-Worst pain ever Pain Location: left arm with any pressure, movement Pain Descriptors / Indicators: Sharp;Sore;Grimacing;Guarding Pain Intervention(s): Limited activity within patient's tolerance;Monitored during session;Premedicated before session    Home Living Family/patient expects to be discharged to:: Private residence Living Arrangements: Non-relatives/Friends(boyfriend) Available Help at Discharge: Friend(s);Family Type of Home: Mobile home Home Access: Stairs to enter Entrance Stairs-Rails: None Entrance Stairs-Number of Steps: 3-4 Home Layout: One level Home Equipment: None      Prior Function Level of Independence: Independent         Comments: communinty ambulator     Hand Dominance   Dominant Hand: Right    Extremity/Trunk Assessment   Upper Extremity Assessment Upper Extremity Assessment: Generalized weakness;RUE deficits/detail;LUE deficits/detail RUE Deficits / Details: grossly 5/5 LUE Deficits / Details: not tested due to in Sarmiento brace    Lower Extremity Assessment Lower Extremity Assessment: Generalized weakness    Cervical / Trunk Assessment Cervical / Trunk Assessment: Normal  Communication   Communication: No difficulties  Cognition Arousal/Alertness: Awake/alert Behavior During Therapy: WFL for tasks assessed/performed Overall Cognitive Status: Within Functional Limits for tasks assessed                                        General Comments      Exercises     Assessment/Plan    PT Assessment Patient needs continued PT services  PT Problem List Decreased strength;Decreased range of motion;Decreased activity tolerance;Decreased balance;Decreased mobility;Pain(LUE in brace)       PT Treatment Interventions Gait training;Stair training;Functional mobility training;Therapeutic  activities;Therapeutic exercise;Patient/family education    PT Goals (Current goals can be found in the Care Plan section)  Acute Rehab PT Goals Patient Stated Goal: return home with boyfriend to assist PT Goal Formulation: With patient Time For Goal Achievement: 12/03/17 Potential to Achieve Goals: Good    Frequency Min 3X/week   Barriers to discharge        Co-evaluation               AM-PAC PT "6 Clicks" Daily Activity  Outcome Measure Difficulty turning over in bed (including adjusting bedclothes, sheets and blankets)?: A Little Difficulty moving from lying on back to sitting on the side of the bed? : A Lot Difficulty sitting down on and standing up from a chair with arms (e.g., wheelchair, bedside commode, etc,.)?: A Little Help needed moving to and from a bed to chair (including a wheelchair)?: A Little Help needed walking in hospital room?: A Lot Help needed climbing 3-5 steps with a railing? : A Lot 6 Click Score: 15    End of Session   Activity Tolerance: Patient tolerated treatment well;Patient limited by fatigue Patient left: in chair;with call bell/phone within reach Nurse Communication: Mobility status PT Visit Diagnosis: Unsteadiness on feet (R26.81);Other abnormalities of gait and mobility (R26.89);Muscle weakness (generalized) (M62.81)    Time: 1010-1039 PT  Time Calculation (min) (ACUTE ONLY): 29 min   Charges:   PT Evaluation $PT Eval Moderate Complexity: 1 Mod PT Treatments $Therapeutic Activity: 23-37 mins        12:32 PM, 11/19/17 Lonell Grandchild, MPT Physical Therapist with [facility] [phone] office [phone] mobile phone

## 2017-11-19 NOTE — Progress Notes (Signed)
PROGRESS NOTE    [name]  BTD:[id number]  DOB: [birth year]  DOA: 11/16/2017 PCP: Javier Docker, MD  Brief Admission Hx: [name] is a 57 y.o. female with medical history significant of metastatic lung cancer, chronic pain syndrome, type 2 diabetes, hypertension, presents with hematuria.  Patient start started noticing hematuria about 2 days prior to admission.  Pt also had been complaining of left arm pain and found to have a pathologic fracture of left humerus.  MDM/Assessment & Plan:   1. UTI/cystitis - with gross hematuria -currently on intravenous antibiotics.  Urine culture in process.  Still having some degree of hematuria. 2. Hypotension - Pt has no sepsis physiology.  She is asymptomatic.  She responds to IV fluid boluses. Continue to monitor closely and follow clinically.  I do not believe that she is septic.  Her lactic acid has been normal.  Blood pressures appear to be stabilizing 3. Pathological humerus fracture -Dr. Wynetta Emery spoke with orthopedist Dr. Mardelle Matte and he is recommending dedicated xrays of the left humerus and he has ordered a Sarmiento brace to be applied for comfort.  Pt will need orthopedic follow up in 1 week with Dr. Mardelle Matte or Dr. Aline Brochure.  Will request physical therapy input. 4. Microcytic anemia -likely related to hematuria.  Patient transfused 2 units of PRBC with mild improvement of hemoglobin.  Mild downtrend in hemoglobin today.  Needs continued monitoring.   5. Type 2 diabetes mellitus - holding home metformin. Continue carb modified diet and sliding scale coverage.  Blood sugars stable 6. AKI - Treating with hydration, avoiding nephrotoxins.  Overall creatinine is improving. Renal US does not show any hydronephrosis 7. Cerebrovascular disease s/p CVA - temporarily holding brilinta and aspirin due to acute blood loss anemia and hematuria.   8. Chronic pain and opioid dependence - resumed her home oxycodone as needed.  Family  reports that patient has been misusing pain medications in the past.  Monitor.  9. Metastatic lung cancer - Pt had a core biopsy of lung 9/24 and pathology suggesting primary lung adenocarcinoma. She has already established care with cancer center at [facility].    DVT prophylaxis: SCDs Code Status: Full  Family Communication: No family present Disposition Plan: Transfer to telemetry   Consultants:  Orthopedics  Oncology   Antimicrobials:  Aztreonam 9/26-  Subjective: Denies any shortness of breath.  She does have a cough.  Reports that pain in her arm is relatively controlled.  Objective: Vitals:   11/19/17 0400 11/19/17 0500 11/19/17 0548 11/19/17 0600  BP: (!) 92/52   (!) 101/51  Pulse: 68  88 77  Resp: 18 18 20 20   Temp: 98.6 F (37 C)     TempSrc: Oral     SpO2: 97%  91% (!) 88%  Weight: 87.9 kg     Height:        Intake/Output Summary (Last 24 hours) at 11/19/2017 0906 Last data filed at 11/19/2017 3646 Gross per 24 hour  Intake 4639.73 ml  Output 250 ml  Net 4389.73 ml   Filed Weights   11/17/17 0232 11/18/17 0400 11/19/17 0400  Weight: 79.8 kg 82.7 kg 87.9 kg     REVIEW OF SYSTEMS  As per history otherwise all reviewed and reported negative  Exam:  General exam: Alert, awake, oriented x 3 Respiratory system: Clear to auscultation. Respiratory effort normal. Cardiovascular system:RRR. No murmurs, rubs, gallops. Gastrointestinal system: Abdomen is nondistended, soft and nontender. No organomegaly or masses felt.  Normal bowel sounds heard. Central nervous system: Alert and oriented. No focal neurological deficits. Extremities: Left upper extremities and brace, bilateral 1+ lower extremity edema Skin: No rashes, lesions or ulcers Psychiatry: Judgement and insight appear normal. Mood & affect appropriate.   Data Reviewed: Basic Metabolic Panel: Recent Labs  Lab 11/16/17 1244 11/17/17 0614 11/18/17 0426 11/19/17 0444  NA 133* 136 139  138  K 3.3* 4.0 3.9 3.9  CL 95* 102 106 109  CO2 29 27 27 25   GLUCOSE 128* 101* 107* 104*  BUN 28* 24* 23* 19  CREATININE 1.67* 1.51* 1.34* 1.18*  CALCIUM 9.4 8.8* 8.6* 8.3*   Liver Function Tests: Recent Labs  Lab 11/16/17 1244 11/18/17 0426  AST 12* 13*  ALT 7 9  ALKPHOS 74 61  BILITOT 0.4 0.4  PROT 7.6 5.8*  ALBUMIN 2.7* 2.0*   No results for input(s): LIPASE, AMYLASE in the last 168 hours. No results for input(s): AMMONIA in the last 168 hours. CBC: Recent Labs  Lab 11/16/17 1244 11/16/17 2201 11/17/17 0614 11/18/17 0426 11/19/17 0444  WBC 13.8* 11.6* 10.3 9.3 10.3  NEUTROABS 10.8  --   --   --   --   HGB 8.6* 7.5* 7.1* 7.9* 7.6*  HCT 28.1* 24.9* 23.4* 26.4* 25.0*  MCV 77.2* 77.8* 77.7* 79.8 79.6  PLT 443* 431* 409* 363 371   Cardiac Enzymes: No results for input(s): CKTOTAL, CKMB, CKMBINDEX, TROPONINI in the last 168 hours. CBG (last 3)  Recent Labs    11/18/17 1634 11/18/17 2055 11/19/17 0815  GLUCAP 117* 138* 109*   Recent Results (from the past 240 hour(s))  Culture, blood (Routine X 2) w Reflex to ID Panel     Status: None (Preliminary result)   Collection Time: 11/16/17  5:52 PM  Result Value Ref Range Status   Specimen Description BLOOD RIGHT HAND  Final   Special Requests   Final    BOTTLES DRAWN AEROBIC AND ANAEROBIC Blood Culture results may not be optimal due to an excessive volume of blood received in culture bottles   Culture   Final    NO GROWTH 3 DAYS Performed at [facility], [address] [place], [place] [address]    Report Status PENDING  Incomplete  Culture, blood (Routine X 2) w Reflex to ID Panel     Status: None (Preliminary result)   Collection Time: 11/16/17  6:53 PM  Result Value Ref Range Status   Specimen Description BLOOD RIGHT HAND  Final   Special Requests   Final    BOTTLES DRAWN AEROBIC ONLY Blood Culture adequate volume   Culture   Final    NO GROWTH 3 DAYS Performed at [facility], [address]  [place], [place] [address]    Report Status PENDING  Incomplete  Urine culture     Status: Abnormal   Collection Time: 11/16/17 11:53 PM  Result Value Ref Range Status   Specimen Description   Final    URINE, CLEAN CATCH Performed at [facility], [address] [place], [place] [address]    Special Requests   Final    NONE Performed at [facility], [address], [place], [place] [address]    Culture (A)  Final    <10,000 COLONIES/mL INSIGNIFICANT GROWTH Performed at [facility], [place] [address] [place], [place] [address]    Report Status 11/18/2017 FINAL  Final     Studies: US Renal  Result Date: 11/17/2017 CLINICAL DATA:  Hydronephrosis  EXAM: RENAL / URINARY TRACT ULTRASOUND COMPLETE COMPARISON:  11/16/2017 FINDINGS: Right Kidney: Length: 11.1 cm. There are multiple somewhat hypoechoic areas identified distributed throughout the right kidney which correspond to the hypodense areas seen on recent CT examination. The largest of these measures 3.9 cm. No hydronephrosis is noted. No calculi are seen. Left Kidney: Length: 11.0 cm. Hypoechoic areas are noted in the upper pole of the left kidney also similar to that seen on recent CT examination. A cyst is identified inferiorly measuring 2.2 cm. No hydronephrosis is noted. Bladder: Appears normal for degree of bladder distention. IMPRESSION: Hypoechoic areas bilaterally which correspond to that seen on recent CT examination. These again likely represent areas of focal pyelonephritis. Cyst is noted in the lower pole of the left kidney. No hydronephrotic changes are noted. Electronically Signed   By: Inez Catalina M.D.   On: 11/17/2017 13:38   Dg Chest Port 1 View  Result Date: 11/18/2017 CLINICAL DATA:  Sepsis. EXAM: PORTABLE CHEST 1 VIEW COMPARISON:  11/17/2017 and CT chest 10/25/2017. FINDINGS: Right PICC tip projects over the SVC RA junction. Trachea is midline. Heart size stable. Left lower lobe mass is better seen on 11/03/2017. Layering  left pleural effusion. Right lung is grossly clear. IMPRESSION: Left lower lobe mass is obscured by layering left pleural fluid and left basilar collapse/consolidation. Electronically Signed   By: Lorin Picket M.D.   On: 11/18/2017 07:33   Dg Chest Port 1 View  Result Date: 11/17/2017 CLINICAL DATA:  Sepsis. History of lung cancer. EXAM: PORTABLE CHEST 1 VIEW COMPARISON:  Yesterday. FINDINGS: Interval patchy opacity throughout the left lung with sparing of the apex. Small amount of left pleural thickening or fluid without significant change. Increased prominence of the pulmonary vasculature. The right lung remains clear. Stable mildly enlarged cardiac silhouette. No pleural fluid on the right. Mild right AC joint degenerative changes. IMPRESSION: 1. Interval patchy opacity throughout the majority of the left lung, suspicious for pneumonia. 2. Interval mild pulmonary vascular congestion with stable mild cardiomegaly. 3. No significant change in a small amount of left pleural thickening or fluid. Electronically Signed   By: Claudie Revering M.D.   On: 11/17/2017 14:06   Dg Humerus Left  Result Date: 11/17/2017 CLINICAL DATA:  Fracture. Pt in a lot of pain and is in a sling. She would not let be move her arm. EXAM: LEFT HUMERUS - 2+ VIEW COMPARISON:  Head CT 11/03/2017 FINDINGS: There is an acute fracture of the mid LEFT humerus, associated with mild angulation and less than 1 centimeter shaft width displacement. The bone in this region is irregular and low attenuation, consistent with metastatic disease and pathologic fracture. No evidence for dislocation of the glenohumeral joint. Opacity at the LEFT lung base again noted. IMPRESSION: Pathologic fracture of the mid LEFT humerus. Electronically Signed   By: Nolon Nations M.D.   On: 11/17/2017 11:09   Scheduled Meds: . sodium chloride   Intravenous Once  . atorvastatin  10 mg Oral q morning - 10a  . Chlorhexidine Gluconate Cloth  6 each Topical Daily  .  gabapentin  1,200 mg Oral QHS  . insulin aspart  0-9 Units Subcutaneous TID WC  . loratadine  10 mg Oral Daily  . mouth rinse  15 mL Mouth Rinse BID  . montelukast  10 mg Oral QHS  . multivitamin with minerals  1 tablet Oral Daily  . polyethylene glycol  17 g Oral Daily  . senna-docusate  1 tablet Oral BID  .  sodium chloride flush  10-40 mL Intracatheter Q12H   Continuous Infusions: . sodium chloride 150 mL/hr at 11/19/17 0653  . sodium chloride 10 mL/hr at 11/19/17 0653  . aztreonam Stopped (11/19/17 0459)  . norepinephrine (LEVOPHED) Adult infusion      Principal Problem:   Hypotension Active Problems:   Chronic pain syndrome   Diabetes mellitus type 2 in obese [facility])   Renal insufficiency   Hypokalemia   Lung cancer, primary, with metastasis from lung to other site, left (HCC)   Hematuria   Anemia   Pathological fracture of left humerus   Chronic constipation  Time spent: 42mins  Kathie Dike, MD [facility] Pager [phone]  If 7PM-7AM, please contact night-coverage [email or web address] Password [email or web address] 11/19/2017, 9:06 AM    LOS: 3 days

## 2017-11-20 ENCOUNTER — Encounter (HOSPITAL_COMMUNITY): Payer: Self-pay | Admitting: Primary Care

## 2017-11-20 DIAGNOSIS — Z7189 Other specified counseling: Secondary | ICD-10-CM

## 2017-11-20 DIAGNOSIS — Z515 Encounter for palliative care: Secondary | ICD-10-CM

## 2017-11-20 LAB — CBC
HCT: 25.6 % — ABNORMAL LOW (ref 36.0–46.0)
HEMOGLOBIN: 7.7 g/dL — AB (ref 12.0–15.0)
MCH: 24 pg — AB (ref 26.0–34.0)
MCHC: 30.1 g/dL (ref 30.0–36.0)
MCV: 79.8 fL (ref 78.0–100.0)
Platelets: 348 10*3/uL (ref 150–400)
RBC: 3.21 MIL/uL — AB (ref 3.87–5.11)
RDW: 20.5 % — ABNORMAL HIGH (ref 11.5–15.5)
WBC: 10.6 10*3/uL — ABNORMAL HIGH (ref 4.0–10.5)

## 2017-11-20 LAB — BASIC METABOLIC PANEL
Anion gap: 6 (ref 5–15)
BUN: 16 mg/dL (ref 6–20)
CHLORIDE: 106 mmol/L (ref 98–111)
CO2: 26 mmol/L (ref 22–32)
Calcium: 8.8 mg/dL — ABNORMAL LOW (ref 8.9–10.3)
Creatinine, Ser: 1.05 mg/dL — ABNORMAL HIGH (ref 0.44–1.00)
GFR calc Af Amer: >60 mL/min (ref >60)
GFR calc non Af Amer: 58 mL/min — ABNORMAL LOW (ref >60)
GLUCOSE: 101 mg/dL — AB (ref 70–99)
POTASSIUM: 4 mmol/L (ref 3.5–5.1)
SODIUM: 138 mmol/L (ref 135–145)

## 2017-11-20 LAB — GLUCOSE, CAPILLARY
GLUCOSE-CAPILLARY: 143 mg/dL — AB (ref 70–99)
GLUCOSE-CAPILLARY: 97 mg/dL (ref 70–99)
GLUCOSE-CAPILLARY: 101 mg/dL — AB (ref 70–99)
Glucose-Capillary: 94 mg/dL (ref 70–99)

## 2017-11-20 MED ORDER — NICOTINE 21 MG/24HR TD PT24
21.0000 mg | MEDICATED_PATCH | Freq: Every day | TRANSDERMAL | Status: DC
  Administered 2017-11-21: 21 mg via TRANSDERMAL
  Filled 2017-11-20 (×2): qty 1

## 2017-11-20 MED ORDER — OXYCODONE HCL 5 MG PO TABS
30.0000 mg | ORAL_TABLET | ORAL | Status: DC | PRN
  Administered 2017-11-20 – 2017-11-21 (×4): 30 mg via ORAL
  Filled 2017-11-20 (×4): qty 6

## 2017-11-20 NOTE — Clinical Social Work Note (Addendum)
Received LCSW consult for assistance with food insecurity. Went to speak with pt today and upon entering the room, found RN CM speaking with pt's family members who were tearful and stating that the doctor just told them pt's cancer is stage IV now and he is recommending hospice. RN CM providing emotional support. Family anticipating a meeting with Palliative APNP today based on MD's statement.   Will be available if residential hospice referral is needed.

## 2017-11-20 NOTE — Progress Notes (Signed)
Natalie Boss, NP will meet with family and pt tomm to discuss hospice care. Pt has had soft BP druing admission and now she doesn't really want her BP to be checked because it hurts her. Elink made aware  Kearstin Learn Rica Mote, RN

## 2017-11-20 NOTE — Progress Notes (Signed)
PROGRESS NOTE    [name]  OXB:[id number]  DOB: [birth year]  DOA: 11/16/2017 PCP: Javier Docker, MD  Brief Admission Hx: [name] is a 57 y.o. female with medical history significant of metastatic lung cancer, chronic pain syndrome, type 2 diabetes, hypertension, presents with hematuria.  Patient started noticing hematuria about 2 days prior to admission.  Pt also had been complaining of left arm pain and found to have a pathologic fracture of left humerus.  MDM/Assessment & Plan:   1. UTI/cystitis - with gross hematuria -she was treated with intravenous antibiotics.  Urine culture has not shown any significant growth.  Hematuria now resolved. Discontinue further antibiotics 2. Hypotension - Pt has no sepsis physiology.  She is asymptomatic.  She responds to IV fluid boluses. Continue to monitor closely and follow clinically.  I do not believe that she is septic.  Her lactic acid has been normal.  Blood pressures still low, but due to restrictions in upper extremities, blood pressure is being checked in her leg, so that readings may not be completely accurate. 3. Pathological humerus fracture -Dr. Wynetta Emery spoke with orthopedist Dr. Mardelle Matte and he is recommending dedicated xrays of the left humerus and he has ordered a Sarmiento brace to be applied for comfort.  Pt will need orthopedic follow up in 1 week with Dr. Mardelle Matte or Dr. Aline Brochure. Seen by physical therapy who recommended SNF. 4. Microcytic anemia -likely related to hematuria.  Patient transfused 2 units of PRBC with mild improvement of hemoglobin.  Mild downtrend in hemoglobin, but now stable.  Needs continued monitoring.   5. Type 2 diabetes mellitus - holding home metformin. Continue on SSI. Blood sugars stable 6. AKI - Treating with hydration, avoiding nephrotoxins.  Overall creatinine is improving. Renal US does not show any hydronephrosis 7. Cerebrovascular disease s/p CVA - temporarily holding brilinta  and aspirin due to acute blood loss anemia and hematuria.   8. Chronic pain and opioid dependence - resumed her home oxycodone as needed.  Family reports that patient has been misusing pain medications in the past.  Monitor.  9. Metastatic lung cancer - Pt had a core biopsy of lung 9/24 and pathology suggesting primary lung adenocarcinoma. Review of recent PET-CT shows evidence of metastatic disease, indicating stage 4 disease. Family reports that patient has not been interested in pursuing treatment. Will consult palliative care to discuss goals of care.    DVT prophylaxis: SCDs Code Status: DNR Family Communication: discussed with sister and niece at bedside Disposition Plan: She seems to be appropriate for hospice services   Consultants:  Orthopedics  Palliative care   Antimicrobials:  Aztreonam 9/26-9/30  Subjective: She is drowsy today. She has not had a bowel movement yet. Continues to have pain in left arm from fracture  Objective: Vitals:   11/20/17 0525 11/20/17 0600 11/20/17 1210 11/20/17 1500  BP:      Pulse: 73 74  78  Resp: 19 17  15   Temp:   99.7 F (37.6 C)   TempSrc:   Oral   SpO2: 96% (!) 89%  96%  Weight:      Height:        Intake/Output Summary (Last 24 hours) at 11/20/2017 1615 Last data filed at 11/20/2017 1538 Gross per 24 hour  Intake 809.29 ml  Output -  Net 809.29 ml   Filed Weights   11/18/17 0400 11/19/17 0400 11/20/17 0400  Weight: 82.7 kg 87.9 kg 88.4 kg     REVIEW  OF SYSTEMS  As per history otherwise all reviewed and reported negative  Exam:  General exam: somnolent, no distress Respiratory system: Clear to auscultation. Respiratory effort normal. Cardiovascular system:RRR. No murmurs, rubs, gallops. Gastrointestinal system: Abdomen is nondistended, soft and nontender. No organomegaly or masses felt. Normal bowel sounds heard. Central nervous system:  No focal neurological deficits. Extremities: left arm in brace, 1+ lower  extremity edema Skin: No rashes, lesions or ulcers Psychiatry: drowsy, does not engage in conversation.   Data Reviewed: Basic Metabolic Panel: Recent Labs  Lab 11/16/17 1244 11/17/17 0614 11/18/17 0426 11/19/17 0444 11/20/17 0855  NA 133* 136 139 138 138  K 3.3* 4.0 3.9 3.9 4.0  CL 95* 102 106 109 106  CO2 29 27 27 25 26   GLUCOSE 128* 101* 107* 104* 101*  BUN 28* 24* 23* 19 16  CREATININE 1.67* 1.51* 1.34* 1.18* 1.05*  CALCIUM 9.4 8.8* 8.6* 8.3* 8.8*   Liver Function Tests: Recent Labs  Lab 11/16/17 1244 11/18/17 0426  AST 12* 13*  ALT 7 9  ALKPHOS 74 61  BILITOT 0.4 0.4  PROT 7.6 5.8*  ALBUMIN 2.7* 2.0*   No results for input(s): LIPASE, AMYLASE in the last 168 hours. No results for input(s): AMMONIA in the last 168 hours. CBC: Recent Labs  Lab 11/16/17 1244 11/16/17 2201 11/17/17 0614 11/18/17 0426 11/19/17 0444 11/20/17 0855  WBC 13.8* 11.6* 10.3 9.3 10.3 10.6*  NEUTROABS 10.8  --   --   --   --   --   HGB 8.6* 7.5* 7.1* 7.9* 7.6* 7.7*  HCT 28.1* 24.9* 23.4* 26.4* 25.0* 25.6*  MCV 77.2* 77.8* 77.7* 79.8 79.6 79.8  PLT 443* 431* 409* 363 371 348   Cardiac Enzymes: No results for input(s): CKTOTAL, CKMB, CKMBINDEX, TROPONINI in the last 168 hours. CBG (last 3)  Recent Labs    11/19/17 2325 11/20/17 0803 11/20/17 1122  GLUCAP 111* 94 143*   Recent Results (from the past 240 hour(s))  Culture, blood (Routine X 2) w Reflex to ID Panel     Status: None (Preliminary result)   Collection Time: 11/16/17  5:52 PM  Result Value Ref Range Status   Specimen Description BLOOD RIGHT HAND  Final   Special Requests   Final    BOTTLES DRAWN AEROBIC AND ANAEROBIC Blood Culture results may not be optimal due to an excessive volume of blood received in culture bottles   Culture   Final    NO GROWTH 4 DAYS Performed at [facility], [address], [place], [place] [address]    Report Status PENDING  Incomplete  Culture, blood (Routine X 2) w Reflex to ID Panel      Status: None (Preliminary result)   Collection Time: 11/16/17  6:53 PM  Result Value Ref Range Status   Specimen Description BLOOD RIGHT HAND  Final   Special Requests   Final    BOTTLES DRAWN AEROBIC ONLY Blood Culture adequate volume   Culture   Final    NO GROWTH 4 DAYS Performed at [facility], [address], [place], [place] [address]    Report Status PENDING  Incomplete  Urine culture     Status: Abnormal   Collection Time: 11/16/17 11:53 PM  Result Value Ref Range Status   Specimen Description   Final    URINE, CLEAN CATCH Performed at [facility], [address], [place], [place] [address]    Special Requests   Final    NONE Performed at  [place] [place], [place] [address]    Culture (A)  Final    <10,000 COLONIES/mL INSIGNIFICANT GROWTH Performed at [place] [address] [place],  [address]    Report Status 11/18/2017 FINAL  Final     Studies: No results found. Scheduled Meds: . atorvastatin  10 mg Oral q morning - 10a  . Chlorhexidine Gluconate Cloth  6 each Topical Daily  . gabapentin  1,200 mg Oral QHS  . insulin aspart  0-9 Units Subcutaneous TID WC  . loratadine  10 mg Oral Daily  . mouth rinse  15 mL Mouth Rinse BID  . montelukast  10 mg Oral QHS  . multivitamin with minerals  1 tablet Oral Daily  . nicotine  21 mg Transdermal Daily  . polyethylene glycol  17 g Oral Daily  . senna-docusate  1 tablet Oral BID  . sodium chloride flush  10-40 mL Intracatheter Q12H   Continuous Infusions: . sodium chloride 10 mL/hr at 11/20/17 1538  . aztreonam Stopped (11/20/17 1504)  . norepinephrine (LEVOPHED) Adult infusion      Principal Problem:   Hypotension Active Problems:   Chronic pain syndrome   Diabetes mellitus type 2 in obese [facility])   Renal insufficiency   Hypokalemia   Lung cancer, primary, with metastasis from lung to other site, left [facility])   Hematuria   Anemia   Pathological fracture of left humerus    Chronic constipation   Goals of care, counseling/discussion   Palliative care by specialist   DNR (do not resuscitate) discussion  Time spent: 47mins  Kathie Dike, MD [facility] Pager [phone]  If 7PM-7AM, please contact night-coverage [email or web address] Password [email or web address] 11/20/2017, 4:15 PM    LOS: 4 days

## 2017-11-20 NOTE — Consult Note (Signed)
Consultation Note Date: 11/20/2017   Patient Name: [name]  DOB: Jul 16, 1960  MRN: [id number]  Age / Sex: 57 y.o., female  PCP: Natalie Docker, MD Referring Physician: Kathie Dike, MD  Reason for Consultation: Establishing goals of care  HPI/Patient Profile: 57 y.o. female  with past medical history of metastatic lung cancer diagnosed September 2019, right mastectomy 2009 tobacco abuse, high blood pressure and cholesterol, diabetes, COPD, chronic back and foot pain, chronic pain syndrome, admitted on 11/16/2017 with UTI/cystitis.   Clinical Assessment and Goals of Care: [name] is resting quietly in bed.  She wakes relatively easily, making but not keeping eye contact.  There is no family at bedside at this time.  I asked about what the doctors have told her.  She tells me that she has not heard much, she does not even know what stage her cancer is.  We talked about staging at 4 due to metastatic burden.  During our conversation [name] will drift off.  Limited conversation today.  We briefly talked about disposition, but share that we will continue to gather information and make a good plan on how we can best care for her.  We talked about healthcare power of attorney, see below.  [name] agrees for me to call her Sister [name] to set up a family meeting for tomorrow.  We talked about CODE STATUS, see below.  Call to sister, [name] at [phone].  We plan a family meeting for 10/1 around 11am. States her sister would not want chemo or radiation.  [name] states that she wants her sister to be in a place that is safe, where she can have comfort and no suffering. Family in [place], daughter in [place], son in [place].  We talked about best location for residential SNF.  No clear choice at this time.  Conversation with hospitalist related to patient condition,  plan of care, CODE STATUS, disposition options, (patient has Medicaid only). [name] with nursing staff related to patient condition, plan of care, disposition questions.  Healthcare power of attorney NEXT OF KIN -[name] states that she wants her sister, [name] to be her Ambulance person.   She lives with longtime boyfriend (21 years), [place].  She shares that he is still working, cannot provide daytime care   SUMMARY OF RECOMMENDATIONS   At this point, continue to treat the treatable but no CPR, no intubation. Considering cancer treatment options. Family meeting 10/1  Code Status/Advance Care Planning:  DNR  Symptom Management:   Per hospitalist, no additional needs at this time.  Palliative Prophylaxis:   Frequent Pain Assessment and Turn Reposition  Additional Recommendations (Limitations, Scope, Preferences):  Treat the treatable but no CPR, no intubation.  Considering cancer treatment options  Psycho-social/Spiritual:   Desire for further Chaplaincy support:no  Additional Recommendations: Caregiving  Support/Resources and Education on Hospice  Prognosis:   < 6 months, or less would not be surprising based on frailty, poor functional status, low albumin at 2.0, metastatic cancer,  2 admits and one ED visit in 6 months.   Discharge Planning: To be determined      Primary Diagnoses: Present on Admission: . Chronic pain syndrome . Diabetes mellitus type 2 in obese ([place]) . Lung cancer, primary, with metastasis from lung to other site, left ([place]) . Hematuria . Anemia . Hypotension . Pathological fracture of left humerus . Hypokalemia . Chronic constipation   I have reviewed the medical record, interviewed the patient and family, and examined the patient. The following aspects are pertinent.  Past Medical History:  Diagnosis Date  . Asthma   . Back muscle spasm 12/13/2010  . Cancer [facility])    breast cancer, remission 2010  . Chronic  back pain   . Chronic foot pain   . COPD (chronic obstructive pulmonary disease) ([place]) 09/14/2010  . Diabetes mellitus without complication ([place])   . H/O MRSA infection 09/14/2010  . High blood cholesterol   . Hyperlipidemia 12/07/2012  . Hypertension   . Lung mass   . 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 Memory loss   . Psoriasis 12/13/2010  . Stroke ([place])   . Tobacco abuse 12/07/2012   Social History   Socioeconomic History  . Marital status: Legally Separated    Spouse name: Not on file  . Number of children: Not on file  . Years of education: Not on file  . Highest education level: Not on file  Occupational History  . Not on file  Social Needs  . Financial resource strain: Not on file  . Food insecurity:    Worry: Not on file    Inability: Not on file  . Transportation needs:    Medical: Not on file    Non-medical: Not on file  Tobacco Use  . Smoking status: Former Smoker    Packs/day: 0.25    Years: 36.00    Pack years: 9.00    Last attempt to quit: 2018    Years since quitting: 1.7  . Smokeless tobacco: Never Used  Substance and Sexual Activity  . Alcohol use: Not Currently    Comment: jack and coke social event  . Drug use: Not Currently    Types: Marijuana    Comment: in the past in the year 69  . Sexual activity: Not on file  Lifestyle  . Physical activity:    Days per week: Not on file    Minutes per session: Not on file  . Stress: Not on file  Relationships  . Social connections:    Talks on phone: Not on file    Gets together: Not on file    Attends religious service: Not on file    Active member of club or organization: Not on file    Attends meetings of clubs or organizations: Not on file    Relationship status: Not on file  Other Topics Concern  . Not on file  Social History Narrative  . Not on file   Family History  Problem Relation Age of Onset  . Stroke Mother   . Stroke Father   . Cancer Maternal Aunt   . Cancer Maternal  Grandmother   . Cancer Maternal Aunt   . Cancer Maternal Aunt    Scheduled Meds: . atorvastatin  10 mg Oral q morning - 10a  . Chlorhexidine Gluconate Cloth  6 each Topical Daily  . gabapentin  1,200 mg Oral QHS  . insulin aspart  0-9 Units Subcutaneous TID WC  . loratadine  10  mg Oral Daily  . mouth rinse  15 mL Mouth Rinse BID  . montelukast  10 mg Oral QHS  . multivitamin with minerals  1 tablet Oral Daily  . nicotine  21 mg Transdermal Daily  . polyethylene glycol  17 g Oral Daily  . senna-docusate  1 tablet Oral BID  . sodium chloride flush  10-40 mL Intracatheter Q12H   Continuous Infusions: . sodium chloride 10 mL/hr at 11/20/17 0618  . aztreonam 1 g (11/20/17 0634)  . norepinephrine (LEVOPHED) Adult infusion     PRN Meds:.acetaminophen **OR** acetaminophen, albuterol, ALPRAZolam, bisacodyl, ondansetron **OR** ondansetron (ZOFRAN) IV, oxycodone, sodium chloride flush Medications Prior to Admission:  Prior to Admission medications   Medication Sig Start Date End Date Taking? Authorizing Provider  albuterol (PROAIR HFA) 108 (90 Base) MCG/ACT inhaler Inhale 1-2 puffs into the lungs every 6 (six) hours as needed for wheezing or shortness of breath.   Yes [provider]  ALPRAZolam Duanne Moron) 0.5 MG tablet Take 0.5 mg by mouth 3 (three) times daily as needed for anxiety.   Yes [provider]  amLODipine (NORVASC) 5 MG tablet Take 5 mg by mouth daily.   Yes [provider]  aspirin EC 81 MG tablet Take 81 mg by mouth daily.   Yes [provider]  atorvastatin (LIPITOR) 10 MG tablet Take 1 tablet (10 mg total) by mouth daily at 6 PM. Patient taking differently: Take 10 mg by mouth every morning.  10/14/16  Yes Angiulli, Lavon Paganini, PA-C  cetirizine (ZYRTEC) 10 MG tablet Take 10 mg by mouth at bedtime.   Yes [provider]  cloNIDine (CATAPRES) 0.1 MG tablet Take 0.1 mg by mouth 2 (two) times daily.   Yes [provider]    ergocalciferol (VITAMIN D2) 50000 units capsule Take 50,000 Units by mouth once a week.   Yes [provider]  furosemide (LASIX) 80 MG tablet Take 80 mg by mouth 2 (two) times daily.   Yes [provider]  gabapentin (NEURONTIN) 400 MG capsule Take 400-1,200 mg by mouth See admin instructions. 400mg  twice daily and 1200mg  at bedtime   Yes [provider]  lisinopril (PRINIVIL,ZESTRIL) 10 MG tablet Take 10 mg by mouth daily.   Yes [provider]  losartan-hydrochlorothiazide (HYZAAR) 100-12.5 MG tablet Take 1 tablet by mouth daily.   Yes [provider]  metFORMIN (GLUCOPHAGE) 500 MG tablet Take 500 mg by mouth 2 (two) times daily.   Yes [provider]  metoprolol tartrate (LOPRESSOR) 50 MG tablet Take 100 mg by mouth 2 (two) times daily.   Yes [provider]  montelukast (SINGULAIR) 10 MG tablet Take 10 mg by mouth at bedtime.   Yes [provider]  oxycodone (ROXICODONE) 30 MG immediate release tablet Take 30 mg by mouth every 6 (six) hours as needed for pain.   Yes [provider]  simvastatin (ZOCOR) 20 MG tablet Take 20 mg by mouth daily.   Yes [provider]  ticagrelor (BRILINTA) 90 MG TABS tablet Take 90 mg by mouth 2 (two) times daily.   Yes [provider]  levofloxacin (LEVAQUIN) 500 MG tablet Take 500 mg by mouth daily. 14 day course starting on 10/27/2017-COMPLETED    [provider]  oxyCODONE (ROXICODONE) 15 MG immediate release tablet Take 1 tablet (15 mg total) by mouth every 4 (four) hours as needed for pain. Patient not taking: Reported on 11/16/2017 10/27/17   Isaac Bliss, Rayford Halsted, MD  Allergies  Allergen Reactions  . Codeine Anaphylaxis and Rash  . Penicillins Anaphylaxis, Rash and Other (See Comments)    Has patient had a PCN reaction causing immediate rash, facial/tongue/throat swelling, SOB or lightheadedness with hypotension: Yes Has patient had a PCN  reaction causing severe rash involving mucus membranes or skin necrosis: No Has patient had a PCN reaction that required hospitalization: Yes Has patient had a PCN reaction occurring within the last 10 years: No If all of the above answers are "NO", then may proceed with Cephalosporin use.    Review of Systems  Unable to perform ROS: Acuity of condition    Physical Exam  Constitutional: No distress.  Appears older than stated age, frail, acutely/chronically ill  HENT:  Head: Atraumatic.  Cardiovascular: Normal rate.  Pulmonary/Chest: Effort normal. No respiratory distress.  Abdominal: Soft. She exhibits no distension.  Musculoskeletal: She exhibits no edema.  Left arm in sling  Neurological: She is alert.  Skin: Skin is warm and dry.  Psychiatric:  Somnolent, subdued  Nursing note and vitals reviewed.   Vital Signs: BP (!) 77/43   Pulse 74   Temp 99.7 F (37.6 C) (Oral)   Resp 17   Ht 5\' 8"  (1.727 m)   Wt 88.4 kg   SpO2 (!) 89%   BMI 29.63 kg/m  Pain Scale: 0-10 POSS *See Group Information*: 1-Acceptable,Awake and alert Pain Score: 9    SpO2: SpO2: (!) 89 % O2 Device:SpO2: (!) 89 % O2 Flow Rate: .O2 Flow Rate (L/min): 2 L/min  IO: Intake/output summary:   Intake/Output Summary (Last 24 hours) at 11/20/2017 1405 Last data filed at 11/20/2017 0618 Gross per 24 hour  Intake 660.1 ml  Output -  Net 660.1 ml    LBM: Last BM Date: 11/14/17 Baseline Weight: Weight: 74.6 kg Most recent weight: Weight: 88.4 kg     Palliative Assessment/Data:   Flowsheet Rows     Most Recent Value  Intake Tab  Referral Department  Hospitalist  Unit at Time of Referral  Cardiac/Telemetry Unit  Palliative Care Primary Diagnosis  Cancer  Date Notified  11/20/17  Palliative Care Type  New Palliative care  Reason for referral  Clarify Goals of Care  Date of Admission  11/16/17  Date first seen by Palliative Care  11/20/17  # of days Palliative referral response time  0 Day(s)    # of days IP prior to Palliative referral  4  Clinical Assessment  Pain Max last 24 hours  Not able to report  Pain Min Last 24 hours  Not able to report  Dyspnea Max Last 24 Hours  Not able to report  Dyspnea Min Last 24 hours  Not able to report  Psychosocial & Spiritual Assessment  Palliative Care Outcomes      Time In: 1420 Time Out: 1530 Time Total: 70 minutes Greater than 50%  of this time was spent counseling and coordinating care related to the above assessment and plan.  Signed by: Drue Novel, NP   Please contact Palliative Medicine Team phone at [phone] for questions and concerns.  For individual provider: See Shea Evans

## 2017-11-20 NOTE — Progress Notes (Signed)
Physical Therapy Treatment Patient Details Name: [name] MRN: [id number] DOB: 1960-11-27 Today's Date: 11/20/2017    History of Present Illness [name] is a 57 y.o. female with medical history significant of metastatic lung cancer, chronic pain syndrome, type 2 diabetes, hypertension, presents with hematuria.  Patient start started noticing hematuria about 2 days ago.  She denies any fever.  Not noticed any change in her urine output.  Of note patient has a history of stroke about a year ago and she is on Brilinta and aspirin.  Patient was diagnosed with lung cancer in September of this year.  She had a lung biopsy done on September 24 of this year.  That day leaving that appointment her daughter helped her get in the car and acute pain of the left arm.  sHe states she had discomfort in the arm even before her diagnosis as well.  Today she was found to have a pathologic fracture of that arm.*PET scan  9/13 showed focal area of intense uptake within the soft tissues of the left upper arm.  She also had bilateral kidney masses which appear low-attenuation increased FDG uptake concerning for bilateral kidney metastasis if urinary tract infection findings were absent.  Her urinalysis showed a few bacteria, trace leuk, large amount of hemoglobin and greater than 50 RBCs.  However did appear contaminated with 21-50 squamous cells and WBC clumps.  Patient was hypotensive upon presentation to the ED.  She is has not been tachycardic, she is afebrile. Her LA was wnl x 2.  After IV fluids ,her blood pressure has increased, most recent to be in the 1 teens.  At baseline she is hypertensive on 3 medications including ARB and diuretic.  Recently admitted  51 /4 for pyelonephritis however her urine cultures were negative.    PT Comments    Patient very drowsy after receiving pain medication and requires occasional repeated verbal cues to complete tasks, limited to stand and taking a couple  of steps using single point cane [facility]) with fair carryover mostly due to drowsiness and not wanting to increase pain in LUE.  Patient tolerated sitting up at bedside to eat lunch with RN supervising.  Patient will benefit from continued physical therapy in hospital and recommended venue below to increase strength, balance, endurance for safe ADLs and gait.   Follow Up Recommendations  Supervision/Assistance - 24 hour;Supervision for mobility/OOB;SNF     Equipment Recommendations  Cane    Recommendations for Other Services       Precautions / Restrictions Precautions Precautions: Fall Required Braces or Orthoses: Other Brace/Splint Other Brace/Splint: brace/splint LUE Restrictions Weight Bearing Restrictions: Yes LUE Weight Bearing: Non weight bearing    Mobility  Bed Mobility Overal bed mobility: Needs Assistance Bed Mobility: Supine to Sit     Supine to sit: Min assist     General bed mobility comments: head of bed raised  Transfers Overall transfer level: Needs assistance Equipment used: Straight cane Transfers: Sit to/from [facility] Sit to Stand: Min assist         General transfer comment: demonstrated fair return for using [facility] for sit to stand  Ambulation/Gait Ambulation/Gait assistance: Min assist Gait Distance (Feet): 2 Feet Assistive device: Straight cane Gait Pattern/deviations: Decreased step length - right;Decreased step length - left;Decreased stride length Gait velocity: slow   General Gait Details: limited to 2-3 slow labored steps at bedside due to drowsy after taking pain medication   Stairs  Wheelchair Mobility    Modified Rankin (Stroke Patients Only)       Balance Overall balance assessment: Needs assistance Sitting-balance support: Feet supported;No upper extremity supported Sitting balance-Leahy Scale: Good     Standing balance support: Single extremity supported;During functional  activity Standing balance-Leahy Scale: Fair Standing balance comment: using single point cane (SPC)                            Cognition Arousal/Alertness: Awake/alert Behavior During Therapy: WFL for tasks assessed/performed Overall Cognitive Status: Within Functional Limits for tasks assessed                                        Exercises      General Comments        Pertinent Vitals/Pain Pain Assessment: Faces Faces Pain Scale: Hurts little more Pain Location: left arm with any pressure, movement Pain Descriptors / Indicators: Sharp;Sore;Grimacing;Guarding Pain Intervention(s): Premedicated before session;Monitored during session;Limited activity within patient's tolerance    Home Living                      Prior Function            PT Goals (current goals can now be found in the care plan section) Acute Rehab PT Goals Patient Stated Goal: return home with boyfriend to assist PT Goal Formulation: With patient Time For Goal Achievement: 12/03/17 Potential to Achieve Goals: Good Progress towards PT goals: Progressing toward goals    Frequency    Min 3X/week      PT Plan Current plan remains appropriate    Co-evaluation              AM-PAC PT "6 Clicks" Daily Activity  Outcome Measure  Difficulty turning over in bed (including adjusting bedclothes, sheets and blankets)?: A Little Difficulty moving from lying on back to sitting on the side of the bed? : A Lot Difficulty sitting down on and standing up from a chair with arms (e.g., wheelchair, bedside commode, etc,.)?: A Little Help needed moving to and from a bed to chair (including a wheelchair)?: A Little Help needed walking in hospital room?: A Lot Help needed climbing 3-5 steps with a railing? : A Lot 6 Click Score: 15    End of Session   Activity Tolerance: Patient tolerated treatment well;Patient limited by fatigue;Patient limited by pain Patient left:  in bed;with call bell/phone within reach(seated at bedside) Nurse Communication: Mobility status;Other (comment)(RN notified that patient requested to sit at bedside to eat lunch) PT Visit Diagnosis: Unsteadiness on feet (R26.81);Other abnormalities of gait and mobility (R26.89);Muscle weakness (generalized) (M62.81)     Time: 4097-3532 PT Time Calculation (min) (ACUTE ONLY): 19 min  Charges:  $Therapeutic Activity: 8-22 mins                     12:34 PM, 11/20/17 Lonell Grandchild, MPT Physical Therapist with [facility] [phone] office [phone] mobile phone

## 2017-11-21 ENCOUNTER — Ambulatory Visit (HOSPITAL_COMMUNITY): Payer: Medicaid Other | Admitting: Hematology

## 2017-11-21 DIAGNOSIS — Z7189 Other specified counseling: Secondary | ICD-10-CM

## 2017-11-21 DIAGNOSIS — Z515 Encounter for palliative care: Secondary | ICD-10-CM

## 2017-11-21 LAB — CULTURE, BLOOD (ROUTINE X 2)
CULTURE: NO GROWTH
CULTURE: NO GROWTH
Special Requests: ADEQUATE

## 2017-11-21 LAB — GLUCOSE, CAPILLARY
Glucose-Capillary: 86 mg/dL (ref 70–99)
Glucose-Capillary: 115 mg/dL — ABNORMAL HIGH (ref 70–99)

## 2017-11-21 LAB — MRSA PCR SCREENING: MRSA by PCR: NEGATIVE

## 2017-11-21 MED ORDER — ALPRAZOLAM 0.25 MG PO TABS
0.2500 mg | ORAL_TABLET | Freq: Three times a day (TID) | ORAL | Status: DC
  Administered 2017-11-21 – 2017-11-22 (×3): 0.25 mg via ORAL
  Filled 2017-11-21 (×3): qty 1

## 2017-11-21 MED ORDER — CITALOPRAM HYDROBROMIDE 20 MG PO TABS
20.0000 mg | ORAL_TABLET | Freq: Every day | ORAL | Status: DC
  Administered 2017-11-21: 20 mg via ORAL
  Filled 2017-11-21: qty 1

## 2017-11-21 MED ORDER — FUROSEMIDE 10 MG/ML IJ SOLN
40.0000 mg | Freq: Once | INTRAMUSCULAR | Status: DC
  Filled 2017-11-21: qty 4

## 2017-11-21 MED ORDER — OXYCODONE HCL ER 20 MG PO T12A
40.0000 mg | EXTENDED_RELEASE_TABLET | Freq: Two times a day (BID) | ORAL | Status: DC
  Administered 2017-11-21 – 2017-11-22 (×3): 40 mg via ORAL
  Filled 2017-11-21 (×3): qty 2

## 2017-11-21 MED ORDER — OXYCODONE HCL 5 MG PO TABS
20.0000 mg | ORAL_TABLET | ORAL | Status: DC | PRN
  Administered 2017-11-21 – 2017-11-22 (×2): 20 mg via ORAL
  Filled 2017-11-21 (×2): qty 4

## 2017-11-21 NOTE — Progress Notes (Signed)
PROGRESS NOTE    [name]  GQQ:[id number]  DOB: [birth year]  DOA: 11/16/2017 PCP: Javier Docker, MD  Brief Admission Hx: [name] is a 57 y.o. female with medical history significant of metastatic lung cancer, chronic pain syndrome, type 2 diabetes, hypertension, presents with hematuria.  Patient started noticing hematuria about 2 days prior to admission.  Pt also had been complaining of left arm pain and found to have a pathologic fracture of left humerus.  MDM/Assessment & Plan:   1. UTI/cystitis - with gross hematuria -she was treated with intravenous antibiotics.  Urine culture has not shown any significant growth.  Hematuria now resolved. Discontinue further antibiotics 2. Hypotension - Pt has no sepsis physiology.  She is asymptomatic.  She responds to IV fluid boluses. Continue to monitor closely and follow clinically.  I do not believe that she is septic.  Her lactic acid has been normal.  Blood pressures still low, but due to restrictions in upper extremities, blood pressure is being checked in her leg, so that readings may not be completely accurate.  3. Pathological humerus fracture -Dr. Wynetta Emery spoke with orthopedist Dr. Mardelle Matte and he is recommending dedicated xrays of the left humerus and he has ordered a Sarmiento brace to be applied for comfort.  Pt will need orthopedic follow up in 1 week with Dr. Mardelle Matte or Dr. Aline Brochure. Seen by physical therapy who recommended SNF. 4. Microcytic anemia -likely related to hematuria.  Patient transfused 2 units of PRBC with mild improvement of hemoglobin.  Mild downtrend in hemoglobin, but now stable.    5. Type 2 diabetes mellitus - holding home metformin. Continue on SSI. Blood sugars stable 6. AKI - Treating with hydration, avoiding nephrotoxins.  Overall creatinine is improving. Renal US does not show any hydronephrosis 7. Cerebrovascular disease s/p CVA -holding brilinta and aspirin due to acute blood loss  anemia and hematuria.   8. Chronic pain and opioid dependence - resumed her home oxycodone as needed.  Family reports that patient has been misusing pain medications in the past.  Monitor.  9. Metastatic lung cancer - Pt had a core biopsy of lung 9/24 and pathology suggesting primary lung adenocarcinoma. Review of recent PET-CT shows evidence of metastatic disease, indicating stage 4 disease. Family reports that patient has not been interested in pursuing treatment. Palliative care meeting today for goals of care.  10. Depression. Will start on celexa 11. Lower extremity edema. Likely related to hypoalbuminemia  DVT prophylaxis: SCDs Code Status: DNR Family Communication: discussed with sister at bedside Disposition Plan: She seems to be appropriate for hospice services. Palliative meeting later today   Consultants:  Orthopedics  Palliative care   Antimicrobials:  Aztreonam 9/26-9/30  Subjective: Says she feels depressed and has felt that way for quite some time now. Pain is reasonably controlled.  Objective: Vitals:   11/21/17 0238 11/21/17 0400 11/21/17 0500 11/21/17 0834  BP: (!) 87/46 (!) 83/46    Pulse: 70 69    Resp: 15 14    Temp:  99.3 F (37.4 C)  99.4 F (37.4 C)  TempSrc:  Oral  Oral  SpO2: 95% 96%    Weight:   88.5 kg   Height:        Intake/Output Summary (Last 24 hours) at 11/21/2017 0952 Last data filed at 11/21/2017 0427 Gross per 24 hour  Intake 802.79 ml  Output -  Net 802.79 ml   Filed Weights   11/19/17 0400 11/20/17 0400 11/21/17 0500  Weight: 87.9 kg 88.4 kg 88.5 kg     REVIEW OF SYSTEMS  As per history otherwise all reviewed and reported negative  Exam:  General exam: no distress Respiratory system: Clear to auscultation. Respiratory effort normal. Cardiovascular system:RRR. No murmurs, rubs, gallops. Gastrointestinal system: Abdomen is nondistended, soft and nontender. No organomegaly or masses felt. Normal bowel sounds  heard. Central nervous system: No focal neurological deficits. Extremities: left arm in brace. 1+ edema bilaterally Skin: No rashes, lesions or ulcers Psychiatry: tearful, mildly somnolent    Data Reviewed: Basic Metabolic Panel: Recent Labs  Lab 11/16/17 1244 11/17/17 0614 11/18/17 0426 11/19/17 0444 11/20/17 0855  NA 133* 136 139 138 138  K 3.3* 4.0 3.9 3.9 4.0  CL 95* 102 106 109 106  CO2 29 27 27 25 26   GLUCOSE 128* 101* 107* 104* 101*  BUN 28* 24* 23* 19 16  CREATININE 1.67* 1.51* 1.34* 1.18* 1.05*  CALCIUM 9.4 8.8* 8.6* 8.3* 8.8*   Liver Function Tests: Recent Labs  Lab 11/16/17 1244 11/18/17 0426  AST 12* 13*  ALT 7 9  ALKPHOS 74 61  BILITOT 0.4 0.4  PROT 7.6 5.8*  ALBUMIN 2.7* 2.0*   No results for input(s): LIPASE, AMYLASE in the last 168 hours. No results for input(s): AMMONIA in the last 168 hours. CBC: Recent Labs  Lab 11/16/17 1244 11/16/17 2201 11/17/17 0614 11/18/17 0426 11/19/17 0444 11/20/17 0855  WBC 13.8* 11.6* 10.3 9.3 10.3 10.6*  NEUTROABS 10.8  --   --   --   --   --   HGB 8.6* 7.5* 7.1* 7.9* 7.6* 7.7*  HCT 28.1* 24.9* 23.4* 26.4* 25.0* 25.6*  MCV 77.2* 77.8* 77.7* 79.8 79.6 79.8  PLT 443* 431* 409* 363 371 348   Cardiac Enzymes: No results for input(s): CKTOTAL, CKMB, CKMBINDEX, TROPONINI in the last 168 hours. CBG (last 3)  Recent Labs    11/20/17 1718 11/20/17 2127 11/21/17 0738  GLUCAP 97 101* 86   Recent Results (from the past 240 hour(s))  Culture, blood (Routine X 2) w Reflex to ID Panel     Status: None   Collection Time: 11/16/17  5:52 PM  Result Value Ref Range Status   Specimen Description BLOOD RIGHT HAND  Final   Special Requests   Final    BOTTLES DRAWN AEROBIC AND ANAEROBIC Blood Culture results may not be optimal due to an excessive volume of blood received in culture bottles   Culture   Final    NO GROWTH 5 DAYS Performed at [facility], [address], [place], [place] [address]    Report Status  11/21/2017 FINAL  Final  Culture, blood (Routine X 2) w Reflex to ID Panel     Status: None   Collection Time: 11/16/17  6:53 PM  Result Value Ref Range Status   Specimen Description BLOOD RIGHT HAND  Final   Special Requests   Final    BOTTLES DRAWN AEROBIC ONLY Blood Culture adequate volume   Culture   Final    NO GROWTH 5 DAYS Performed at [facility], [address] [place], [place] [address]    Report Status 11/21/2017 FINAL  Final  Urine culture     Status: Abnormal   Collection Time: 11/16/17 11:53 PM  Result Value Ref Range Status   Specimen Description   Final    URINE, CLEAN CATCH Performed at [facility], [address] [place], [place] [address]    Special Requests   Final  NONE Performed at [facility], [address], [place], [place] [address]    Culture (A)  Final    <10,000 COLONIES/mL INSIGNIFICANT GROWTH Performed at [facility] [address] [place], [place] [address]    Report Status 11/18/2017 FINAL  Final  MRSA PCR Screening     Status: None   Collection Time: 11/20/17  4:54 PM  Result Value Ref Range Status   MRSA by PCR NEGATIVE NEGATIVE Final    Comment:        The GeneXpert MRSA Assay (FDA approved for NASAL specimens only), is one component of a comprehensive MRSA colonization surveillance program. It is not intended to diagnose MRSA infection nor to guide or monitor treatment for MRSA infections. Performed at [facility], [address], [place], [place] [address]      Studies: No results found. Scheduled Meds: . atorvastatin  10 mg Oral q morning - 10a  . Chlorhexidine Gluconate Cloth  6 each Topical Daily  . citalopram  20 mg Oral Daily  . furosemide  40 mg Intravenous Once  . gabapentin  1,200 mg Oral QHS  . insulin aspart  0-9 Units Subcutaneous TID WC  . loratadine  10 mg Oral Daily  . mouth rinse  15 mL Mouth Rinse BID  . montelukast  10 mg Oral QHS  . multivitamin with minerals  1 tablet Oral Daily  .  nicotine  21 mg Transdermal Daily  . polyethylene glycol  17 g Oral Daily  . senna-docusate  1 tablet Oral BID  . sodium chloride flush  10-40 mL Intracatheter Q12H   Continuous Infusions: . sodium chloride 10 mL/hr at 11/20/17 1538    Principal Problem:   Hypotension Active Problems:   Chronic pain syndrome   Diabetes mellitus type 2 in obese (HCC)   Renal insufficiency   Hypokalemia   Lung cancer, primary, with metastasis from lung to other site, left (HCC)   Hematuria   Anemia   Pathological fracture of left humerus   Chronic constipation   Goals of care, counseling/discussion   Palliative care by specialist   DNR (do not resuscitate) discussion  Time spent: 51mins  Kathie Dike, MD [facility] Pager [phone]  If 7PM-7AM, please contact night-coverage [email or web address] Password [email or web address] 11/21/2017, 9:52 AM    LOS: 5 days

## 2017-11-21 NOTE — Clinical Social Work Note (Signed)
Referral made to [facility].   Makynna Manocchio, Clydene Pugh, LCSW

## 2017-11-21 NOTE — Clinical Social Work Note (Signed)
Received consult from [facility] stating pt and family would like referral to [facility]. Once Palliative Care note is complete, will send the referral. CSW will follow and assist for transition of care needs.

## 2017-11-21 NOTE — Care Management Note (Signed)
Case Management Note  Patient Details  Name: ARIELY RIDDELL MRN: [id number] Date of Birth: [birth year]  If discussed at Long Length of Stay Meetings, dates discussed:  11/21/2017  Additional Comments:  Dejanee Thibeaux, Chauncey Reading, RN 11/21/2017, 12:09 PM

## 2017-11-21 NOTE — Progress Notes (Signed)
Palliative: [name] is lying quietly in bed.  Her Sister [name] and a daughter she gave up for adoption, [name], are at bedside.  We talk with [name] about what is important to her, what she wants at this time. [name] starts talking about disposition, stating that [name] wants to go to her own home, that [name] does not want hospice for her.  Sister [name] states that family cannot care for [name] at home in her current state, and that hospice is the best option.  I share that we need to hear [name] voice.  I asked who she would like to have present at the meeting, and she states her daughters, and her Sister Dub Mikes.  [name] takes a phone call and leaves the room.  At this point [name] states that she wants her sister [name] to help her, and to make healthcare choices for her.  This is consistent with what she said yesterday.  [name] goes to the family waiting room.  We talked about the severity of [name] condition.  We talked about comfort and dignity.  We also talked about the benefits of residential hospice.  [name] shares that they will be able to bring [name] pet to the facility. I also share that family can stay overnight if they want to, and they can give her foods as she wishes/can tolerate.  I asked [name] if she would like to know what I think for prognosis, she states tearfully that she does not.  Both [name] and her Sister [name] are requesting residential hospice.  Dub Mikes has already chosen hospice of [facility].  We talked about what is and is not provided at residential hospice.  Patient and family are agreeable to unburden Mrs. [name] for medications that are not changing what is happening, or that are not focused on comfort.  [name] states that her preference is to mostly sleep.  Sister [name] and I talk in the hallway.  She asks about prognosis.  I share a diagram of the chronic  illness pathway, what is normal and expected, a relatively sharp decline in the last 3 months of life (for cancer).  I share that I would not be surprised if [name] has 4 weeks or less.  [name] states that she has seen a decline in her sister over the last 2 months.  We talk about what is normal and expected at end of life including sleeping more, eating and interacting less.  [name] states that some of her family is fearful that [name] will be denied food at hospice.  We had previously in the room with [name] present talked about there being a kitchen in a refrigerator/freezer.  I share that if [name] has a favorite ice cream, they are encouraged to bring her whatever foods make her happy.  I believe they continue to get meals delivered from a local provider.  We again talked about what is normal and expected at end of life including sleeping more, eating and interacting less. Dub Mikes shares that [name] has a complicated family system.  She is a boyfriend of 21 years with whom she lives, but he cannot provide around-the-clock care.  She has a daughter [name].  She has a son who is in [place] who has spent 15 years in prison.  There is also [name] who was adopted by another family.  There are 2 other children who are not discussed.  Conference with nursing staff  related to plan of care, comfort care, disposition.  Conference with social worker related to residential hospice referral, comfort care.  Prognosis: 4 weeks or less would not be surprising based on frailty, stage IV cancer, albumin of 2.0, patient and family's desire to focus on comfort only.  65 minutes, extended time Quinn Axe, NP Palliative Medicine Team Team Phone # [phone]  Greater than 50% of this time was spent counseling and coordinating care related to the above assessment and plan.

## 2017-11-21 NOTE — Clinical Social Work Note (Signed)
[name], [place] #[id number] (CSN: [id number]) (57 y.o. F) (Adm: 11/16/17)  AP-ICCUP-[id number]  PCP   PAVELOCK, Ralene Bathe  Demographics  Comment    Last edited by  on at   Address: Home Phone: Work Phone: Mobile Phone:  [place] [address]   [phone] -- [phone]  SSN: Insurance: Marital Status: Religion:  [id number] [place] Legally Separated Destin  Patient Ethnicity & Race   Ethnic Group Patient Race  Not Hispanic or Latino White or Caucasian  Documents Filed to Patient   Power of Attorney Living Will Clinical Unknown Study Attachment Consent Form ABN Waiver After Visit Summary Lab Result Scan Code Status MyChart Status Advance Care Planning  Not on File Not on File Not on File Not on File Filed Not on [place] DNR [Updated on 11/20/17 1525] Pending Jump to the Activity  Admission Information   Attending Provider Admitting Provider Admission Type Admission Date/Time  Kathie Dike, MD Shelbie Proctor, MD Emergency 11/16/17 1257  Discharge Date Hospital Service Auth/Cert Status [facility] Incomplete [facility]  Unit Room/Bed Admission Status   AP-ICCUP NURSING IC04/IC04-01 Admission (Confirmed)   Hospital Account   Name Acct ID Class Status Primary Coverage  [name], [name] [id number] Inpatient Open MEDICAID [place]      Guarantor Account (for Hospital Account [id number])   Name Relation to [place]? Acct Type  [name], [name] Self CHSA Yes Personal/Family  Address Phone    [place], [place] [address] [phone](H)        Coverage Information (for Hospital Account [id number])   F/O Payor/Plan Precert #  MEDICAID [place]/MEDICAID [facility] ACCESS   Subscriber Subscriber #  [name], [name] [id number]  Address Phone  [address] [place], [place] [address] [phone]

## 2017-11-22 DIAGNOSIS — E876 Hypokalemia: Secondary | ICD-10-CM

## 2017-11-22 MED ORDER — GABAPENTIN 400 MG PO CAPS: 1200.0000 mg | ORAL_CAPSULE | Freq: Every day | ORAL | Status: AC

## 2017-11-22 MED ORDER — FLUCONAZOLE 100 MG PO TABS
100.0000 mg | ORAL_TABLET | Freq: Every day | ORAL | Status: DC
  Administered 2017-11-22: 100 mg via ORAL
  Filled 2017-11-22: qty 1

## 2017-11-22 MED ORDER — OXYCODONE HCL ER 40 MG PO T12A: 40.0000 mg | EXTENDED_RELEASE_TABLET | Freq: Two times a day (BID) | ORAL | 0 refills | Status: AC

## 2017-11-22 MED ORDER — ALPRAZOLAM 0.5 MG PO TABS: 0.5000 mg | ORAL_TABLET | Freq: Three times a day (TID) | ORAL | 0 refills | Status: AC | PRN

## 2017-11-22 MED ORDER — SENNOSIDES-DOCUSATE SODIUM 8.6-50 MG PO TABS: 1.0000 | ORAL_TABLET | Freq: Two times a day (BID) | ORAL | Status: AC

## 2017-11-22 MED ORDER — OXYCODONE HCL 20 MG PO TABS: 20.0000 mg | ORAL_TABLET | ORAL | 0 refills | Status: AC | PRN

## 2017-11-22 MED ORDER — FLUCONAZOLE 100 MG PO TABS: 100.0000 mg | ORAL_TABLET | Freq: Every day | ORAL | 0 refills | Status: AC

## 2017-11-22 NOTE — Discharge Summary (Signed)
Physician Discharge Summary  [name] DTO:[id number] DOB: [birth year] DOA: 11/16/2017  PCP: Javier Docker, MD  Admit date: 11/16/2017 Discharge date: 11/22/2017  Admitted From: home Disposition:  Residential hospice  Recommendations for Outpatient Follow-up:  1. Patient will be discharged to residential hospice for end of life care  Discharge Condition:hospice CODE STATUS:DNR Diet recommendation: regular diet for comfort  Brief/Interim Summary: 57 year old female with a history of breast cancer in the past, newly diagnosed metastatic lung cancer,, chronic pain syndrome, type 2 diabetes, hypertension, presented with hematuria and generalized weakness.  She also had left arm pain prior to admission and was found to have a pathologic fracture of her humerus.  Discharge Diagnoses:  Principal Problem:   Hypotension Active Problems:   Chronic pain syndrome   Diabetes mellitus type 2 in obese [facility])   Renal insufficiency   Hypokalemia   Lung cancer, primary, with metastasis from lung to other site, left (HCC)   Hematuria   Anemia   Pathological fracture of left humerus   Chronic constipation   Goals of care, counseling/discussion   Palliative care by specialist   DNR (do not resuscitate) discussion   Encounter for hospice care discussion  1. UTI/cystitis - with gross hematuria -she was initially treated with intravenous antibiotics.  Urine culture did not shown any significant growth.  Hematuria now resolved.  Antibiotics have been discontinued 2. Hypotension - Pt has no sepsis physiology.  She is asymptomatic.  She responds to IV fluid boluses. Continue to monitor closely and follow clinically.  I do not believe that she is septic.  Her lactic acid has been normal.  Blood pressures still low, but due to restrictions in upper extremities, blood pressure is being checked in her leg, so that readings may not be completely accurate.  3. Pathological humerus fracture -Dr.  Wynetta Emery spoke with orthopedist Dr. Mardelle Matte and he is recommending a Sarmiento brace to be applied for comfort.  Patient is not interested in any further orthopedic follow-up/surgery. 4. Microcytic anemia -likely related to hematuria.  Patient transfused 2 units of PRBC with mild improvement of hemoglobin.  Mild downtrend in hemoglobin, but now stable.    5. Type 2 diabetes mellitus - holding home metformin.  Treated with sliding scale insulin. Blood sugars stable 6. AKI - Treating with hydration, avoiding nephrotoxins.  Overall creatinine is improving. Renal US does not show any hydronephrosis 7. Cerebrovascular disease s/p CVA -holding brilinta and aspirin due to acute blood loss anemia and hematuria.   8. Chronic pain and opioid dependence - resumed her home oxycodone as needed and started on OxyContin. 9. Metastatic lung cancer - Pt had a core biopsy of lung 9/24 and pathology suggesting primary lung adenocarcinoma. Review of recent PET-CT shows evidence of metastatic disease, indicating stage 4 disease.  Patient does not desire further treatment of malignancy 10. Depression. Will start on celexa 11. Lower extremity edema. Likely related to hypoalbuminemia 12. Goals of care.  Seen by palliative care and patient and family have elected comfort care.  In light of her stage IV malignancy, poor nutritional status and functional status, she appears to be a good candidate for hospice.  She is been accepted to residential hospice.  Discharge Instructions  Discharge Instructions    Diet - low sodium heart healthy   Complete by:  As directed    Increase activity slowly   Complete by:  As directed      Allergies as of 11/22/2017      Reactions  Codeine Anaphylaxis, Rash   Penicillins Anaphylaxis, Rash, Other (See Comments)   Has patient had a PCN reaction causing immediate rash, facial/tongue/throat swelling, SOB or lightheadedness with hypotension: Yes Has patient had a PCN reaction causing severe  rash involving mucus membranes or skin necrosis: No Has patient had a PCN reaction that required hospitalization: Yes Has patient had a PCN reaction occurring within the last 10 years: No If all of the above answers are "NO", then may proceed with Cephalosporin use.      Medication List    STOP taking these medications   amLODipine 5 MG tablet Commonly known as:  NORVASC   aspirin EC 81 MG tablet   atorvastatin 10 MG tablet Commonly known as:  LIPITOR   BRILINTA 90 MG Tabs tablet Generic drug:  ticagrelor   cetirizine 10 MG tablet Commonly known as:  ZYRTEC   cloNIDine 0.1 MG tablet Commonly known as:  CATAPRES   ergocalciferol 50000 units capsule Commonly known as:  VITAMIN D2   levofloxacin 500 MG tablet Commonly known as:  LEVAQUIN   lisinopril 10 MG tablet Commonly known as:  PRINIVIL,ZESTRIL   losartan-hydrochlorothiazide 100-12.5 MG tablet Commonly known as:  HYZAAR   metFORMIN 500 MG tablet Commonly known as:  GLUCOPHAGE   metoprolol tartrate 50 MG tablet Commonly known as:  LOPRESSOR   montelukast 10 MG tablet Commonly known as:  SINGULAIR   oxyCODONE 15 MG immediate release tablet Commonly known as:  ROXICODONE Replaced by:  oxyCODONE 40 mg 12 hr tablet You also have another medication with the same name that you need to continue taking as instructed.   simvastatin 20 MG tablet Commonly known as:  ZOCOR     TAKE these medications   ALPRAZolam 0.5 MG tablet Commonly known as:  XANAX Take 1 tablet (0.5 mg total) by mouth 3 (three) times daily as needed for anxiety.   fluconazole 100 MG tablet Commonly known as:  DIFLUCAN Take 1 tablet (100 mg total) by mouth daily. Start taking on:  11/23/2017   furosemide 80 MG tablet Commonly known as:  LASIX Take 80 mg by mouth 2 (two) times daily.   gabapentin 400 MG capsule Commonly known as:  NEURONTIN Take 3 capsules (1,200 mg total) by mouth at bedtime. What changed:    how much to take  when  to take this  additional instructions   oxyCODONE 40 mg 12 hr tablet Commonly known as:  OXYCONTIN Take 1 tablet (40 mg total) by mouth every 12 (twelve) hours. What changed:  You were already taking a medication with the same name, and this prescription was added. Make sure you understand how and when to take each. Replaces:  oxyCODONE 15 MG immediate release tablet   Oxycodone HCl 20 MG Tabs Take 1 tablet (20 mg total) by mouth every 2 (two) hours as needed for moderate pain or severe pain. What changed:    medication strength  how much to take  when to take this  reasons to take this  Another medication with the same name was removed. Continue taking this medication, and follow the directions you see here.   PROAIR HFA 108 (90 Base) MCG/ACT inhaler Generic drug:  albuterol Inhale 1-2 puffs into the lungs every 6 (six) hours as needed for wheezing or shortness of breath.   senna-docusate 8.6-50 MG tablet Commonly known as:  Senokot-S Take 1 tablet by mouth 2 (two) times daily.      Follow-up Information    Marchia Bond,  MD. Schedule an appointment as soon as possible for a visit in 1 week.   Specialty:  Orthopedic Surgery Contact information: [address]  [place] [address] [phone]          Allergies  Allergen Reactions  . Codeine Anaphylaxis and Rash  . Penicillins Anaphylaxis, Rash and Other (See Comments)    Has patient had a PCN reaction causing immediate rash, facial/tongue/throat swelling, SOB or lightheadedness with hypotension: Yes Has patient had a PCN reaction causing severe rash involving mucus membranes or skin necrosis: No Has patient had a PCN reaction that required hospitalization: Yes Has patient had a PCN reaction occurring within the last 10 years: No If all of the above answers are "NO", then may proceed with Cephalosporin use.     Consultations:  Palliative care   Procedures/Studies: Dg Chest 1  View  Result Date: 11/16/2017 CLINICAL DATA:  Lung cancer. Hematuria. Unable to lift her left arm. EXAM: CHEST  1 VIEW COMPARISON:  11/14/2017. PET-CT dated 11/03/2017. FINDINGS: Interval pathological fracture through the mid left humeral shaft in through and ill-defined lytic lesion with lateral angulation and mild lateral displacement of the distal fragment. Previously demonstrated left lower lobe mass and left axillary surgical clips. Clear right lung. Diffuse osteopenia. IMPRESSION: 1. Interval pathological fracture of the mid left humeral shaft at the location of a lytic metastasis. 2. Stable left lower lobe mass. Electronically Signed   By: Claudie Revering M.D.   On: 11/16/2017 16:42   Dg Chest 2 View  Result Date: 10/25/2017 CLINICAL DATA:  Cough, fever EXAM: CHEST - 2 VIEW COMPARISON:  None. FINDINGS: There is a large rounded opacity in the left lower lobe. There is no other focal parenchymal opacity. There is no pleural effusion or pneumothorax. The heart and mediastinal contours are unremarkable. The osseous structures are unremarkable. IMPRESSION: Large rounded opacity in the left lower lobe. This may reflect pneumonia versus a pulmonary mass. Recommend further evaluation with a CT of the chest with intravenous contrast. Electronically Signed   By: Kathreen Devoid   On: 10/25/2017 09:46   Ct Chest W Contrast  Result Date: 10/25/2017 CLINICAL DATA:  Cough, fever, left lower lobe opacity on chest radiograph. Left flank pain, fever. EXAM: CT CHEST, ABDOMEN, AND PELVIS WITH CONTRAST TECHNIQUE: Multidetector CT imaging of the chest, abdomen and pelvis was performed following the standard protocol during bolus administration of intravenous contrast. CONTRAST:  103mL OMNIPAQUE IOHEXOL 300 MG/ML  SOLN COMPARISON:  Chest radiographs dated 10/25/2017. CT chest abdomen pelvis dated 10/05/2012. FINDINGS: CT CHEST FINDINGS Cardiovascular: Heart is normal in size.  No pericardial effusion. No evidence of thoracic  aortic aneurysm. Atherosclerotic calcifications of the aortic arch. 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 No suspicious mediastinal lymphadenopathy. 14 mm short axis low-density left infrahilar node (series 2/image 78), suspicious. Visualized thyroid is unremarkable. Mediastinum/Nodes: No suspicious mediastinal lymphadenopathy. Necrotic 14 mm short axis left infrahilar node (series 2/image 78), suspicious. Visualized thyroid is unremarkable. Lungs/Pleura: 6.9 x 5.2 x 6.8 cm centrally necrotic left lower lobe mass, abutting the pleura/left chest wall, highly suspicious for primary bronchogenic neoplasm. Mass abuts the left fissure. Mild biapical pleural-parenchymal scarring. No focal consolidation. No pleural effusion or pneumothorax. Musculoskeletal: Mild degenerative changes of the mid thoracic spine. CT ABDOMEN PELVIS FINDINGS Hepatobiliary: Liver is within normal limits, noting focal fat/altered perfusion along the falciform ligament (series 15/image 23). Gallbladder is underdistended. No intrahepatic or extrahepatic ductal dilatation. Pancreas: Within normal  limits. Spleen: Cystic lesions in the spleen measuring up to 2.2 cm, likely benign. Adrenals/Urinary Tract: Adrenal glands are within normal limits. Multiple hypoenhancing renal lesions, suggesting pyelonephritis, with possible early renal abscesses measuring up to 3.5 cm in the left upper kidney (series 15/image 29) and 3.1 cm in the right upper kidney (series 15/image 35). No hydronephrosis. Bladder is within normal limits. Stomach/Bowel: Stomach is within normal limits. No evidence of bowel obstruction. Appendix is not discretely visualized. Vascular/Lymphatic: No evidence of abdominal aortic aneurysm. Atherosclerotic calcifications of the abdominal aorta and branch vessels. No suspicious abdominopelvic lymphadenopathy. Reproductive: Status post hysterectomy. No adnexal masses. Other: No abdominopelvic ascites.  Musculoskeletal: Mild degenerative changes at L5-S1. IMPRESSION: 6.9 cm centrally necrotic left lower lobe mass, abutting the pleura/left chest wall and left fissure, suspicious for primary bronchogenic neoplasm. Necrotic 14 mm left infrahilar node, suspicious for nodal metastasis. Multiple hypoenhancing renal lesion, suggesting pyelonephritis, with possible early renal abscesses measuring up to 3.5 cm on the left. Technically metastases or lymphoma could have a similar appearance, although this is considered unlikely given the clinical symptoms. Follow-up CT or MRI abdomen with/without contrast is suggested in 3-4 weeks after appropriate antimicrobial therapy to ensure resolution. 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 Electronically Signed   By: Julian Hy M.D.   On: 10/25/2017 13:16   Ct Abdomen Pelvis W Contrast  Result Date: 11/16/2017 CLINICAL DATA:  Recently diagnosed metastatic lung cancer. Hematuria for the past 2 days. EXAM: CT ABDOMEN AND PELVIS WITH CONTRAST TECHNIQUE: Multidetector CT imaging of the abdomen and pelvis was performed using the standard protocol following bolus administration of intravenous contrast. CONTRAST:  47mL ISOVUE-300 IOPAMIDOL (ISOVUE-300) INJECTION 61% COMPARISON:  Abdomen and pelvis CT dated 10/25/2017 and PET-CT dated 13/2 1,019. FINDINGS: Lower chest: The pathological fracture of the mid left humeral shaft seen earlier today is again demonstrated on the scout image. Also again demonstrated is the previously demonstrated necrotic left lower lobe mass with an interval small left pleural effusion. Minimal atelectasis of the right lung base. Hepatobiliary: No focal liver abnormality is seen. No gallstones, gallbladder wall thickening, or biliary dilatation. Pancreas: Unremarkable. No pancreatic ductal dilatation or surrounding inflammatory changes. Spleen: Stable cystic lesions. Adrenals/Urinary Tract: Normal appearing adrenal glands. Mild  increase in size and decrease in density of multiple heterogeneously hypodense masses in both kidneys. A simple parapelvic cyst in the inferior left kidney is unchanged. There is mildly increased perinephric soft tissue stranding on the right with interval mild perinephric soft tissue stranding on the left. There is also periureteric soft tissue stranding on the right, not previously present. Normal appearing urinary bladder and left ureter. No urinary tract calculi or hydronephrosis. Stomach/Bowel: Unremarkable stomach, small bowel and colon. No evidence of appendicitis. Vascular/Lymphatic: Atheromatous arterial calcifications without aneurysm. Mildly enlarged retrocaval lymph nodes at the level of the kidneys the largest has some central low density, similar to the lesions in the kidneys, with a short axis diameter of 10 mm on image number 34 series 2. Mildly prominent left para-aortic lymph nodes more inferiorly without abnormal enlargement. Reproductive: Status post hysterectomy. No adnexal masses. Other: Midline surgical scar. Musculoskeletal: Mild lumbar and lower thoracic spine degenerative changes. IMPRESSION: 1. Mild increase in size and decrease in density of multiple bilateral renal masses, with mildly increased right perinephric soft tissue stranding and interval mild left perinephric soft tissue stranding. This combination of findings is most compatible with progressive bilateral pyelonephritis with intrarenal abscesses. Again malignancy, such as lymphoma or  renal metastases, is also a possibility. 2. No urinary tract calculi or hydronephrosis. 3. Stable pathological fracture of the mid left humeral shaft. 4. Interval mild retroperitoneal adenopathy. This could be reactive due to the adjacent pyelonephritis or metastatic if the areas in the kidneys are malignant. 5. Previously demonstrated left lower lobe necrotic lung cancer with an interval small left pleural effusion. Electronically Signed   By:  Claudie Revering M.D.   On: 11/16/2017 20:49   Ct Abdomen Pelvis W Contrast  Result Date: 10/25/2017 CLINICAL DATA:  Cough, fever, left lower lobe opacity on chest radiograph. Left flank pain, fever. EXAM: CT CHEST, ABDOMEN, AND PELVIS WITH CONTRAST TECHNIQUE: Multidetector CT imaging of the chest, abdomen and pelvis was performed following the standard protocol during bolus administration of intravenous contrast. CONTRAST:  40mL OMNIPAQUE IOHEXOL 300 MG/ML  SOLN COMPARISON:  Chest radiographs dated 10/25/2017. CT chest abdomen pelvis dated 10/05/2012. FINDINGS: CT CHEST FINDINGS Cardiovascular: Heart is normal in size.  No pericardial effusion. No evidence of thoracic aortic aneurysm. Atherosclerotic calcifications of the aortic arch. 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 No suspicious mediastinal lymphadenopathy. 14 mm short axis low-density left infrahilar node (series 2/image 78), suspicious. Visualized thyroid is unremarkable. Mediastinum/Nodes: No suspicious mediastinal lymphadenopathy. Necrotic 14 mm short axis left infrahilar node (series 2/image 78), suspicious. Visualized thyroid is unremarkable. Lungs/Pleura: 6.9 x 5.2 x 6.8 cm centrally necrotic left lower lobe mass, abutting the pleura/left chest wall, highly suspicious for primary bronchogenic neoplasm. Mass abuts the left fissure. Mild biapical pleural-parenchymal scarring. No focal consolidation. No pleural effusion or pneumothorax. Musculoskeletal: Mild degenerative changes of the mid thoracic spine. CT ABDOMEN PELVIS FINDINGS Hepatobiliary: Liver is within normal limits, noting focal fat/altered perfusion along the falciform ligament (series 15/image 23). Gallbladder is underdistended. No intrahepatic or extrahepatic ductal dilatation. Pancreas: Within normal limits. Spleen: Cystic lesions in the spleen measuring up to 2.2 cm, likely benign. Adrenals/Urinary Tract: Adrenal glands are within normal limits.  Multiple hypoenhancing renal lesions, suggesting pyelonephritis, with possible early renal abscesses measuring up to 3.5 cm in the left upper kidney (series 15/image 29) and 3.1 cm in the right upper kidney (series 15/image 35). No hydronephrosis. Bladder is within normal limits. Stomach/Bowel: Stomach is within normal limits. No evidence of bowel obstruction. Appendix is not discretely visualized. Vascular/Lymphatic: No evidence of abdominal aortic aneurysm. Atherosclerotic calcifications of the abdominal aorta and branch vessels. No suspicious abdominopelvic lymphadenopathy. Reproductive: Status post hysterectomy. No adnexal masses. Other: No abdominopelvic ascites. Musculoskeletal: Mild degenerative changes at L5-S1. IMPRESSION: 6.9 cm centrally necrotic left lower lobe mass, abutting the pleura/left chest wall and left fissure, suspicious for primary bronchogenic neoplasm. Necrotic 14 mm left infrahilar node, suspicious for nodal metastasis. Multiple hypoenhancing renal lesion, suggesting pyelonephritis, with possible early renal abscesses measuring up to 3.5 cm on the left. Technically metastases or lymphoma could have a similar appearance, although this is considered unlikely given the clinical symptoms. Follow-up CT or MRI abdomen with/without contrast is suggested in 3-4 weeks after appropriate antimicrobial therapy to ensure resolution. 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 Electronically Signed   By: Julian Hy M.D.   On: 10/25/2017 13:16   US Renal  Result Date: 11/17/2017 CLINICAL DATA:  Hydronephrosis EXAM: RENAL / URINARY TRACT ULTRASOUND COMPLETE COMPARISON:  11/16/2017 FINDINGS: Right Kidney: Length: 11.1 cm. There are multiple somewhat hypoechoic areas identified distributed throughout the right kidney which correspond to the hypodense areas seen on recent CT examination. The  largest of these measures 3.9 cm. No hydronephrosis is noted. No calculi are seen. Left  Kidney: Length: 11.0 cm. Hypoechoic areas are noted in the upper pole of the left kidney also similar to that seen on recent CT examination. A cyst is identified inferiorly measuring 2.2 cm. No hydronephrosis is noted. Bladder: Appears normal for degree of bladder distention. IMPRESSION: Hypoechoic areas bilaterally which correspond to that seen on recent CT examination. These again likely represent areas of focal pyelonephritis. Cyst is noted in the lower pole of the left kidney. No hydronephrotic changes are noted. Electronically Signed   By: Inez Catalina M.D.   On: 11/17/2017 13:38   Nm Pet Image Initial (pi) Skull Base To Thigh  Result Date: 11/03/2017 CLINICAL DATA:  Initial treatment strategy for lung mass. EXAM: NUCLEAR MEDICINE PET SKULL BASE TO THIGH TECHNIQUE: 8.4 mCi F-18 FDG was injected intravenously. Full-ring PET imaging was performed from the skull base to thigh after the radiotracer. CT data was obtained and used for attenuation correction and anatomic localization. Fasting blood glucose: 106 mg/dl COMPARISON:  10/25/2017 FINDINGS: Mediastinal blood pool activity: SUV max 1.91 NECK: Mild to moderate FDG uptake identified within right level 2 lymph nodes within SUV max of 4.99. Incidental CT findings: none CHEST: No hypermetabolic supraclavicular or axillary lymph nodes. No right hilar, subcarinal or mediastinal hypermetabolic lymph nodes. FDG uptake within the left hilum has an SUV max of 9.56. There is a solid centrally necrotic mass within the left lower lobe of 6.8 cm and has an SUV max of 18.6. There is a small nodule within the right upper lobe which is nonspecific and too small to characterize by PET-CT measuring 5 mm. Incidental CT findings: Aortic atherosclerosis noted. RCA coronary artery atherosclerotic calcifications identified. ABDOMEN/PELVIS: There is no abnormal radiotracer activity identified within the liver, pancreas, or spleen. Hypermetabolic low-density kidney masses are  identified. There is intense FDG uptake associated with these lesions. Some of these lesions appear centrally necrotic. Arising from the upper pole of right kidney the largest measures 3.9 cm within SUV max of 14.8. Hypermetabolic right retroperitoneal node measures 1.2 cm and has an SUV max 10.68. Along the posterior wall of the proximal stomach there is a focal area of increased radiotracer uptake which is nonspecific, SUV max equals 11.29. Incidental CT findings: Aortic atherosclerosis identified. No aneurysm. SKELETON: There is a focal area of increased uptake within the left upper arm which has an SUV max equal to 6.87. On the CT images there appears to be a corresponding soft tissue lesion adjacent to the musculature measuring 2.3 cm. No osseous lesions identified. Incidental CT findings: none IMPRESSION: 1. Left lower lobe necrotic lung mass is identified and is intensely hypermetabolic compatible with primary bronchogenic carcinoma. Correlation with tissue sampling is advised. 2. Hypermetabolic left hilar lymph nodes compatible with metastatic adenopathy. 3. Bilateral kidney masses are identified which appear low-attenuation and exhibit increased FDG uptake. In the absence of signs or symptoms of urinary tract infection findings are highly concerning for bilateral kidney metastasis. 4. Hypermetabolic retroperitoneal lymph node worrisome for metastatic adenopathy. 5. Nonspecific focus of intense uptake localizing to the posterior wall the proximal stomach. Although this may represent an area of physiologic activity, primary or metastatic lesion cannot be excluded. 6. Focal area of intense uptake identified within the soft tissues of the left upper arm with corresponding 3.3 cm soft tissue nodule. Suspicious for metastatic disease. 7. Mild FDG uptake is associated with small right level 2 cervical lymph nodes.  Nonspecific. 8.  Aortic Atherosclerosis (ICD10-I70.0). Electronically Signed   By: Kerby Moors  M.D.   On: 11/03/2017 11:09   US Venous Img Lower Bilateral  Result Date: 11/06/2017 CLINICAL DATA:  Bilateral lower extremity pain and swelling. EXAM: BILATERAL LOWER EXTREMITY VENOUS DOPPLER ULTRASOUND TECHNIQUE: Gray-scale sonography with graded compression, as well as color Doppler and duplex ultrasound were performed to evaluate the lower extremity deep venous systems from the level of the common femoral vein and including the common femoral, femoral, profunda femoral, popliteal and calf veins including the posterior tibial, peroneal and gastrocnemius veins when visible. The superficial great saphenous vein was also interrogated. Spectral Doppler was utilized to evaluate flow at rest and with distal augmentation maneuvers in the common femoral, femoral and popliteal veins. COMPARISON:  None. FINDINGS: RIGHT LOWER EXTREMITY Common Femoral Vein: No evidence of thrombus. Normal compressibility, respiratory phasicity and response to augmentation. Saphenofemoral Junction: No evidence of thrombus. Normal compressibility and flow on color Doppler imaging. Profunda Femoral Vein: No evidence of thrombus. Normal compressibility and flow on color Doppler imaging. Femoral Vein: No evidence of thrombus. Normal compressibility, respiratory phasicity and response to augmentation. Popliteal Vein: No evidence of thrombus. Normal compressibility, respiratory phasicity and response to augmentation. Calf Veins: No evidence of thrombus. Normal compressibility and flow on color Doppler imaging. Superficial Great Saphenous Vein: No evidence of thrombus. Normal compressibility. Venous Reflux:  None. Other Findings: Prominent right inguinal lymph node is likely reactive. LEFT LOWER EXTREMITY Common Femoral Vein: No evidence of thrombus. Normal compressibility, respiratory phasicity and response to augmentation. Saphenofemoral Junction: No evidence of thrombus. Normal compressibility and flow on color Doppler imaging. Profunda  Femoral Vein: No evidence of thrombus. Normal compressibility and flow on color Doppler imaging. Femoral Vein: No evidence of thrombus. Normal compressibility, respiratory phasicity and response to augmentation. Popliteal Vein: No evidence of thrombus. Normal compressibility, respiratory phasicity and response to augmentation. Calf Veins: No evidence of thrombus. Normal compressibility and flow on color Doppler imaging. Superficial Great Saphenous Vein: No evidence of thrombus. Normal compressibility. Venous Reflux:  None. Other Findings:  None. IMPRESSION: No evidence of deep venous thrombosis in the lower extremities. Electronically Signed   By: Markus Daft M.D.   On: 11/06/2017 13:45   US Venous Img Upper Left (dvt Study)  Result Date: 11/16/2017 CLINICAL DATA:  57 year old female with left upper extremity swelling from the shoulder to the hand for the past 2 days EXAM: LEFT UPPER EXTREMITY VENOUS DOPPLER ULTRASOUND TECHNIQUE: Gray-scale sonography with graded compression, as well as color Doppler and duplex ultrasound were performed to evaluate the upper extremity deep venous system from the level of the subclavian vein and including the jugular, axillary, basilic, radial, ulnar and upper cephalic vein. Spectral Doppler was utilized to evaluate flow at rest and with distal augmentation maneuvers. COMPARISON:  None. FINDINGS: Contralateral Subclavian Vein: Respiratory phasicity is normal and symmetric with the symptomatic side. No evidence of thrombus. Normal compressibility. Internal Jugular Vein: No evidence of thrombus. Normal compressibility, respiratory phasicity and response to augmentation. Subclavian Vein: No evidence of thrombus. Normal compressibility, respiratory phasicity and response to augmentation. Axillary Vein: Unable to image secondary to patient non tolerance. Cephalic Vein: No evidence of thrombus. Normal compressibility, respiratory phasicity and response to augmentation. Basilic Vein: No  evidence of thrombus. Normal compressibility, respiratory phasicity and response to augmentation. Brachial Veins: No evidence of thrombus. Normal compressibility, respiratory phasicity and response to augmentation. Radial Veins: No evidence of thrombus. Normal compressibility, respiratory phasicity and response to augmentation. Ulnar  Veins: No evidence of thrombus. Normal compressibility, respiratory phasicity and response to augmentation. Venous Reflux:  None visualized. Other Findings:  None visualized. IMPRESSION: No evidence of DVT within the left upper extremity. Electronically Signed   By: Jacqulynn Cadet M.D.   On: 11/16/2017 17:25   Ct Biopsy  Result Date: 11/14/2017 INDICATION: 57 year old female with a left lower lobe pulmonary mass and concern for multifocal metastatic disease. She presents for CT-guided biopsy of the same to confirm tissue diagnosis. EXAM: CT-guided biopsy left lower lobe pulmonary mass Interventional Radiologist:  Criselda Peaches, MD MEDICATIONS: None. ANESTHESIA/SEDATION: Fentanyl 50 mcg IV; Versed 1 mg IV Moderate Sedation Time:  11 minutes The patient was continuously monitored during the procedure by the interventional radiology nurse under my direct supervision. FLUOROSCOPY TIME:  Fluoroscopy Time: 0 minutes 0 seconds (0 mGy). COMPLICATIONS: None immediate. Estimated blood loss:  0 PROCEDURE: Informed written consent was obtained from the patient after a thorough discussion of the procedural risks, benefits and alternatives. All questions were addressed. Maximal Sterile Barrier Technique was utilized including caps, mask, sterile gowns, sterile gloves, sterile drape, hand hygiene and skin antiseptic. A timeout was performed prior to the initiation of the procedure. A planning axial CT scan was performed. The mass in the left lower lobe was successfully identified. A suitable skin entry site was selected and marked. The region was then sterilely prepped and draped in  standard fashion using Betadine skin prep. Local anesthesia was attained by infiltration with 1% lidocaine. A small dermatotomy was made. Under intermittent CT fluoroscopic guidance, a 17 gauge trocar needle was advanced into the lung and positioned at the margin of the nodule. Multiple 18 gauge core biopsies were then coaxially obtained using the BioPince automated biopsy device. Biopsy specimens were placed in formalin and delivered to pathology for further analysis. The biopsy device and introducer needle were removed. Post biopsy axial CT imaging demonstrates no evidence of immediate complication. There is no pneumothorax. Mild perilesional alveolar hemorrhage is not unexpected. The patient tolerated the procedure well. IMPRESSION: Technically successful CT-guided biopsy left lower lobe pulmonary mass. Care was taken to biopsy the peripheral hypermetabolic component of the mass. Electronically Signed   By: Jacqulynn Cadet M.D.   On: 11/14/2017 16:07   Dg Chest Port 1 View  Result Date: 11/18/2017 CLINICAL DATA:  Sepsis. EXAM: PORTABLE CHEST 1 VIEW COMPARISON:  11/17/2017 and CT chest 10/25/2017. FINDINGS: Right PICC tip projects over the SVC RA junction. Trachea is midline. Heart size stable. Left lower lobe mass is better seen on 11/03/2017. Layering left pleural effusion. Right lung is grossly clear. IMPRESSION: Left lower lobe mass is obscured by layering left pleural fluid and left basilar collapse/consolidation. Electronically Signed   By: Lorin Picket M.D.   On: 11/18/2017 07:33   Dg Chest Port 1 View  Result Date: 11/17/2017 CLINICAL DATA:  Sepsis. History of lung cancer. EXAM: PORTABLE CHEST 1 VIEW COMPARISON:  Yesterday. FINDINGS: Interval patchy opacity throughout the left lung with sparing of the apex. Small amount of left pleural thickening or fluid without significant change. Increased prominence of the pulmonary vasculature. The right lung remains clear. Stable mildly enlarged cardiac  silhouette. No pleural fluid on the right. Mild right AC joint degenerative changes. IMPRESSION: 1. Interval patchy opacity throughout the majority of the left lung, suspicious for pneumonia. 2. Interval mild pulmonary vascular congestion with stable mild cardiomegaly. 3. No significant change in a small amount of left pleural thickening or fluid. Electronically Signed   By:  Claudie Revering M.D.   On: 11/17/2017 14:06   Dg Chest Port 1 View  Result Date: 11/14/2017 CLINICAL DATA:  Biopsy of left lower lobe lung mass earlier today, follow-up EXAM: PORTABLE CHEST 1 VIEW COMPARISON:  PET-CT of 11/03/2017 and chest x-ray of 10/25/2016 FINDINGS: No left pneumothorax is seen after biopsy of the left lower lobe lung mass. The right lung appears clear. Mediastinal and hilar contours are unchanged. IMPRESSION: No pneumothorax after biopsy of the left lower lobe lung lesion. Electronically Signed   By: Ivar Drape M.D.   On: 11/14/2017 16:15   Dg Humerus Left  Result Date: 11/17/2017 CLINICAL DATA:  Fracture. Pt in a lot of pain and is in a sling. She would not let be move her arm. EXAM: LEFT HUMERUS - 2+ VIEW COMPARISON:  Head CT 11/03/2017 FINDINGS: There is an acute fracture of the mid LEFT humerus, associated with mild angulation and less than 1 centimeter shaft width displacement. The bone in this region is irregular and low attenuation, consistent with metastatic disease and pathologic fracture. No evidence for dislocation of the glenohumeral joint. Opacity at the LEFT lung base again noted. IMPRESSION: Pathologic fracture of the mid LEFT humerus. Electronically Signed   By: Nolon Nations M.D.   On: 11/17/2017 11:09       Subjective: Reports continued pain overnight. Oxycodone not lasting full 4 hours  Discharge Exam: Vitals:   11/21/17 2000 11/22/17 0600 11/22/17 0741 11/22/17 0810  BP: (!) 158/91 (!) 160/78    Pulse: (!) 106 (!) 112    Resp: (!) 22 20    Temp: 99.3 F (37.4 C) 98.9 F (37.2 C)  98.8 F (37.1 C)   TempSrc: Oral Oral Oral   SpO2: 91% 92%  91%  Weight:      Height:        General: Pt is alert, awake, not in acute distress Cardiovascular: RRR, S1/S2 +, no rubs, no gallops Respiratory: CTA bilaterally, no wheezing, no rhonchi Abdominal: Soft, NT, ND, bowel sounds + Extremities: 1+edema, no cyanosis, left arm in brace for comfort    The results of significant diagnostics from this hospitalization (including imaging, microbiology, ancillary and laboratory) are listed below for reference.     Microbiology: Recent Results (from the past 240 hour(s))  Culture, blood (Routine X 2) w Reflex to ID Panel     Status: None   Collection Time: 11/16/17  5:52 PM  Result Value Ref Range Status   Specimen Description BLOOD RIGHT HAND  Final   Special Requests   Final    BOTTLES DRAWN AEROBIC AND ANAEROBIC Blood Culture results may not be optimal due to an excessive volume of blood received in culture bottles   Culture   Final    NO GROWTH 5 DAYS Performed at [facility], [address], [place], [place] [address]    Report Status 11/21/2017 FINAL  Final  Culture, blood (Routine X 2) w Reflex to ID Panel     Status: None   Collection Time: 11/16/17  6:53 PM  Result Value Ref Range Status   Specimen Description BLOOD RIGHT HAND  Final   Special Requests   Final    BOTTLES DRAWN AEROBIC ONLY Blood Culture adequate volume   Culture   Final    NO GROWTH 5 DAYS Performed at [facility], [address] [place], [place] [address]    Report Status 11/21/2017 FINAL  Final  Urine culture     Status: Abnormal  Collection Time: 11/16/17 11:53 PM  Result Value Ref Range Status   Specimen Description   Final    URINE, CLEAN CATCH Performed at [facility], [address], [place], [place] [address]    Special Requests   Final    NONE Performed at [facility], [address], [place], [place] [address]    Culture (A)  Final    <10,000 COLONIES/mL INSIGNIFICANT  GROWTH Performed at [facility] [address] [place], [place] [address]    Report Status 11/18/2017 FINAL  Final  MRSA PCR Screening     Status: None   Collection Time: 11/20/17  4:54 PM  Result Value Ref Range Status   MRSA by PCR NEGATIVE NEGATIVE Final    Comment:        The GeneXpert MRSA Assay (FDA approved for NASAL specimens only), is one component of a comprehensive MRSA colonization surveillance program. It is not intended to diagnose MRSA infection nor to guide or monitor treatment for MRSA infections. Performed at [facility], [address], [place],  [address]      Labs: BNP (last 3 results) No results for input(s): BNP in the last 8760 hours. Basic Metabolic Panel: Recent Labs  Lab 11/16/17 1244 11/17/17 0614 11/18/17 0426 11/19/17 0444 11/20/17 0855  NA 133* 136 139 138 138  K 3.3* 4.0 3.9 3.9 4.0  CL 95* 102 106 109 106  CO2 29 27 27 25 26   GLUCOSE 128* 101* 107* 104* 101*  BUN 28* 24* 23* 19 16  CREATININE 1.67* 1.51* 1.34* 1.18* 1.05*  CALCIUM 9.4 8.8* 8.6* 8.3* 8.8*   Liver Function Tests: Recent Labs  Lab 11/16/17 1244 11/18/17 0426  AST 12* 13*  ALT 7 9  ALKPHOS 74 61  BILITOT 0.4 0.4  PROT 7.6 5.8*  ALBUMIN 2.7* 2.0*   No results for input(s): LIPASE, AMYLASE in the last 168 hours. No results for input(s): AMMONIA in the last 168 hours. CBC: Recent Labs  Lab 11/16/17 1244 11/16/17 2201 11/17/17 0614 11/18/17 0426 11/19/17 0444 11/20/17 0855  WBC 13.8* 11.6* 10.3 9.3 10.3 10.6*  NEUTROABS 10.8  --   --   --   --   --   HGB 8.6* 7.5* 7.1* 7.9* 7.6* 7.7*  HCT 28.1* 24.9* 23.4* 26.4* 25.0* 25.6*  MCV 77.2* 77.8* 77.7* 79.8 79.6 79.8  PLT 443* 431* 409* 363 371 348   Cardiac Enzymes: No results for input(s): CKTOTAL, CKMB, CKMBINDEX, TROPONINI in the last 168 hours. BNP: Invalid input(s): POCBNP CBG: Recent Labs  Lab 11/20/17 1122 11/20/17 1718 11/20/17 2127 11/21/17 0738 11/21/17 1631  GLUCAP 143*  97 101* 86 115*   D-Dimer No results for input(s): DDIMER in the last 72 hours. Hgb A1c No results for input(s): HGBA1C in the last 72 hours. Lipid Profile No results for input(s): CHOL, HDL, LDLCALC, TRIG, CHOLHDL, LDLDIRECT in the last 72 hours. Thyroid function studies No results for input(s): TSH, T4TOTAL, T3FREE, THYROIDAB in the last 72 hours.  Invalid input(s): FREET3 Anemia work up No results for input(s): VITAMINB12, FOLATE, FERRITIN, TIBC, IRON, RETICCTPCT in the last 72 hours. Urinalysis    Component Value Date/Time   COLORURINE YELLOW 11/16/2017 2353   APPEARANCEUR CLOUDY (A) 11/16/2017 2353   LABSPEC 1.021 11/16/2017 2353   PHURINE 5.0 11/16/2017 2353   GLUCOSEU NEGATIVE 11/16/2017 2353   HGBUR LARGE (A) 11/16/2017 2353   BILIRUBINUR NEGATIVE 11/16/2017 2353   KETONESUR NEGATIVE 11/16/2017 2353   PROTEINUR 30 (A)  11/16/2017 2353   UROBILINOGEN 0.2 06/01/2011 1541   NITRITE NEGATIVE 11/16/2017 2353   LEUKOCYTESUR NEGATIVE 11/16/2017 2353   Sepsis Labs Invalid input(s): PROCALCITONIN,  WBC,  LACTICIDVEN Microbiology Recent Results (from the past 240 hour(s))  Culture, blood (Routine X 2) w Reflex to ID Panel     Status: None   Collection Time: 11/16/17  5:52 PM  Result Value Ref Range Status   Specimen Description BLOOD RIGHT HAND  Final   Special Requests   Final    BOTTLES DRAWN AEROBIC AND ANAEROBIC Blood Culture results may not be optimal due to an excessive volume of blood received in culture bottles   Culture   Final    NO GROWTH 5 DAYS Performed at [facility], [address], [place], [place] [address]    Report Status 11/21/2017 FINAL  Final  Culture, blood (Routine X 2) w Reflex to ID Panel     Status: None   Collection Time: 11/16/17  6:53 PM  Result Value Ref Range Status   Specimen Description BLOOD RIGHT HAND  Final   Special Requests   Final    BOTTLES DRAWN AEROBIC ONLY Blood Culture adequate volume   Culture   Final    NO GROWTH 5  DAYS Performed at [facility], [address] [place], [place] [address]    Report Status 11/21/2017 FINAL  Final  Urine culture     Status: Abnormal   Collection Time: 11/16/17 11:53 PM  Result Value Ref Range Status   Specimen Description   Final    URINE, CLEAN CATCH Performed at [facility], [address] [place], [place] [address]    Special Requests   Final    NONE Performed at [facility], [address] [place], [place] [address]    Culture (A)  Final    <10,000 COLONIES/mL INSIGNIFICANT GROWTH Performed at [facility], [place] [address], [place], [place] [address]    Report Status 11/18/2017 FINAL  Final  MRSA PCR Screening     Status: None   Collection Time: 11/20/17  4:54 PM  Result Value Ref Range Status   MRSA by PCR NEGATIVE NEGATIVE Final    Comment:        The GeneXpert MRSA Assay (FDA approved for NASAL specimens only), is one component of a comprehensive MRSA colonization surveillance program. It is not intended to diagnose MRSA infection nor to guide or monitor treatment for MRSA infections. Performed at [facility], [address], [place], [place] [address]      Time coordinating discharge: 52mins  SIGNED:   Kathie Dike, MD  [facility] 11/22/2017, 10:49 AM Pager   If 7PM-7AM, please contact night-coverage [email or web address] Password [email or web address]

## 2017-11-22 NOTE — Clinical Social Work Note (Signed)
LCSW following. Pt has been accepted for transfer to [facility] today. DC summary sent to hospice. EMS transport arranged. Pt's RN called report. Pt and family aware and in agreement with transition of care plan. There are no other CSW needs at this time.

## 2017-11-22 NOTE — Progress Notes (Signed)
Report called to Santiago Glad, Therapist, sports at [facility] at 1100.

## 2017-11-22 NOTE — Plan of Care (Signed)
Adequate for discharge.

## 2017-11-30 ENCOUNTER — Ambulatory Visit: Payer: Medicaid Other | Admitting: Neurology

## 2017-12-22 DEATH — deceased

## 2020-03-17 IMAGING — CT CT ABD-PELV W/ CM
2 of 10 series · 14 of 37 positions shown, 16 images · IV contrast (omnipaque)
Comparison: Chest radiographs dated 10/25/2017. CT chest abdomen
pelvis dated 10/05/2012.

CLINICAL DATA: Cough, fever, left lower lobe opacity on chest
radiograph. Left flank pain, fever.

EXAM:
CT CHEST, ABDOMEN, AND PELVIS WITH CONTRAST
TECHNIQUE: Multidetector CT imaging of the chest, abdomen and pelvis was
performed following the standard protocol during bolus
administration of intravenous contrast.
CONTRAST:  75mL OMNIPAQUE IOHEXOL 300 MG/ML  SOLN

[Series 2: axial st · axial · 0.68mm/px · z∈[-134,+150]mm · 8 of 166 slices shown (1 of 2)]
[im 12/166  lung]
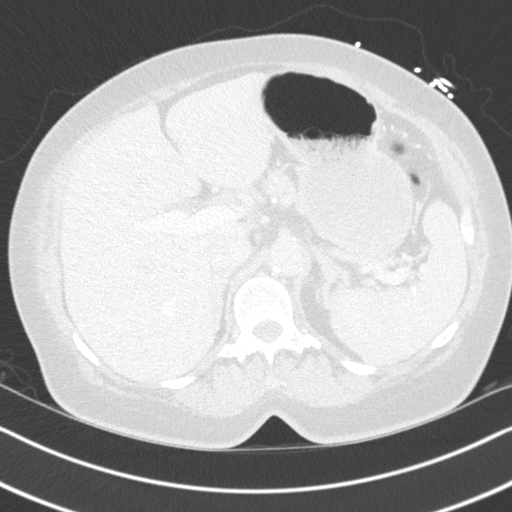
[im 36/166  lung]
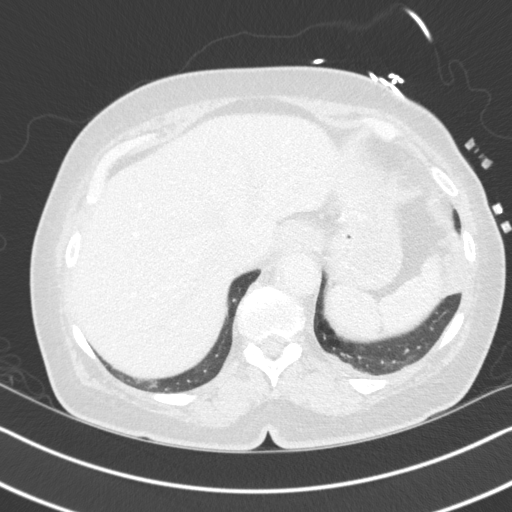
[im 59/166  lung]
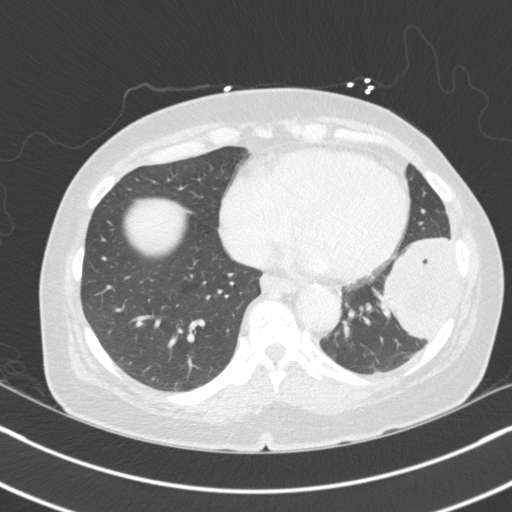
[im 71/166  lung]
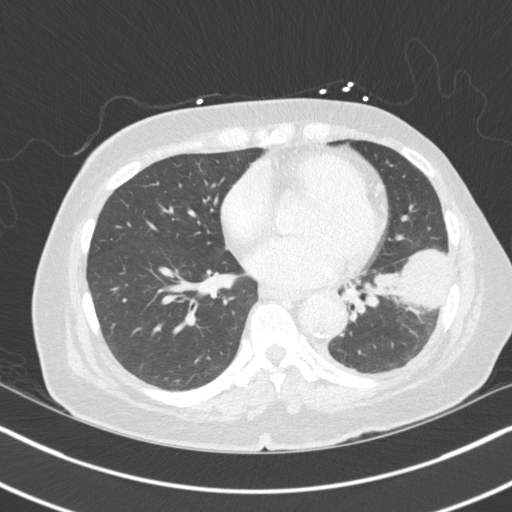
[im 95/166  lung]
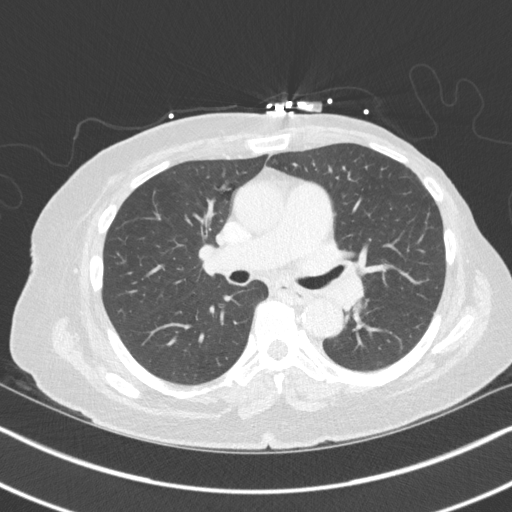
[im 107/166  lung]
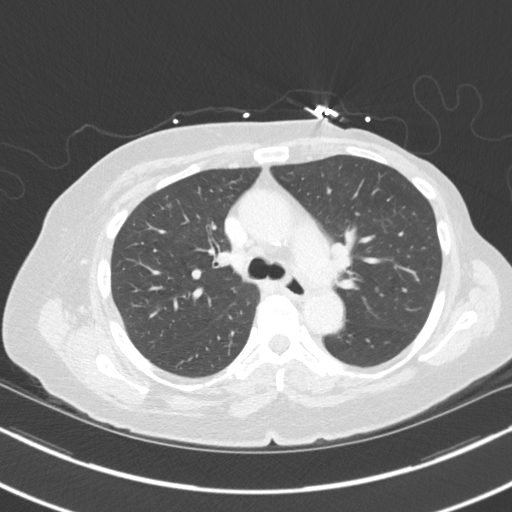
[im 130/166  lung]
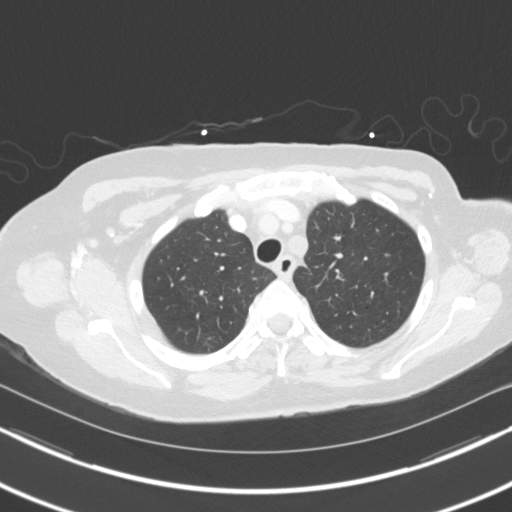
[im 154/166  lung]
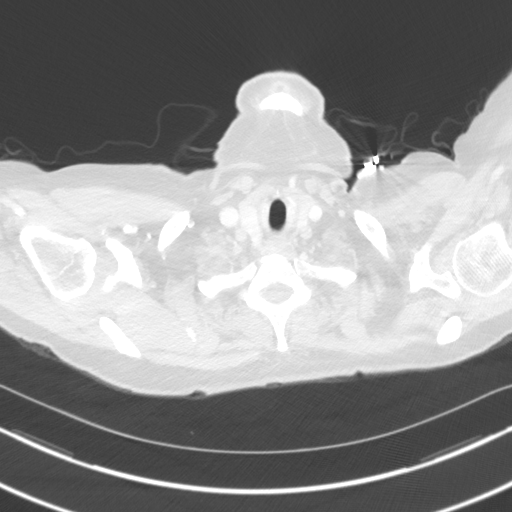

[Series 15: axial st · axial · 0.77mm/px · z∈[-397,-87]mm · 6 of 88 slices shown, 8 images (2 of 2)]
[im 13/88  mediastinal]
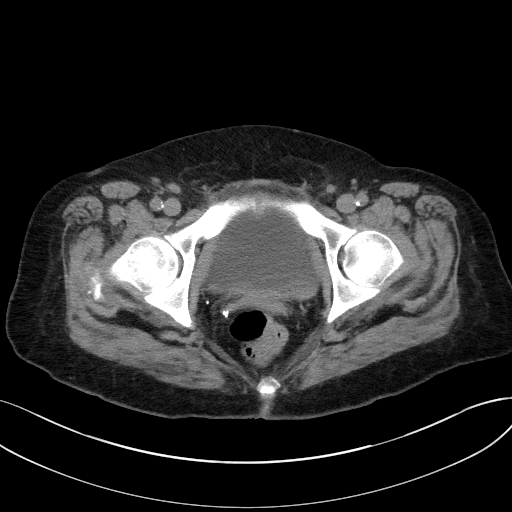
[im 13/88  lung]
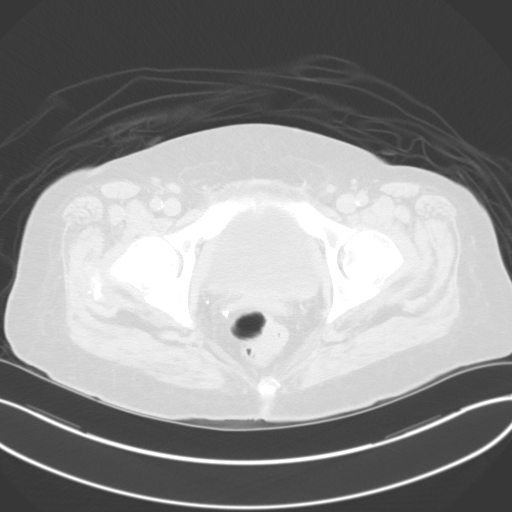
[im 25/88  lung]
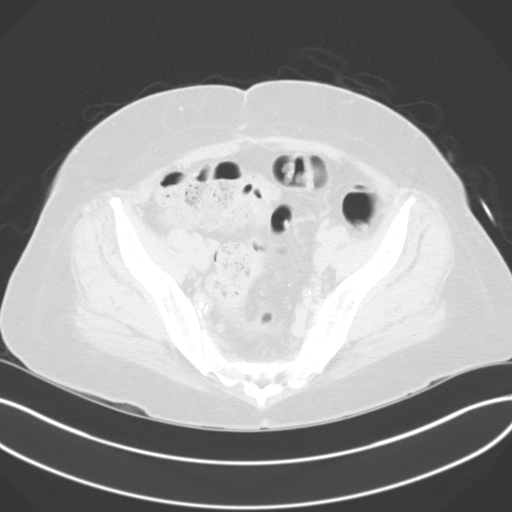
[im 38/88  lung]
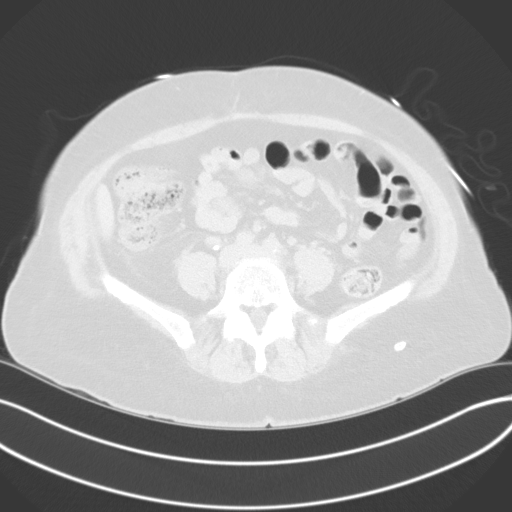
[im 50/88  lung]
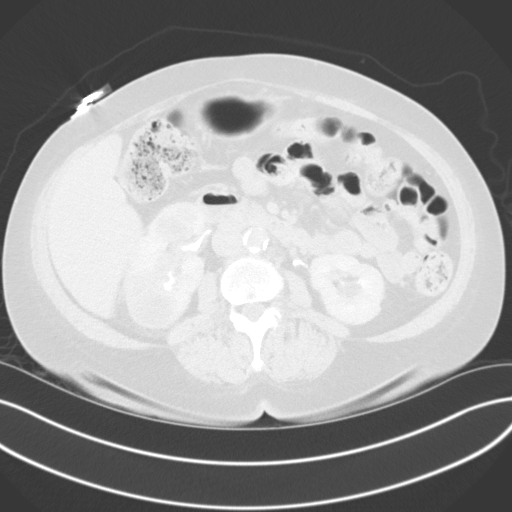
[im 63/88  mediastinal]
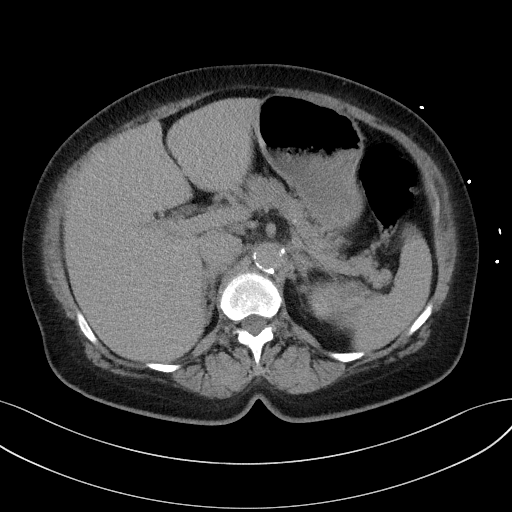
[im 63/88  lung]
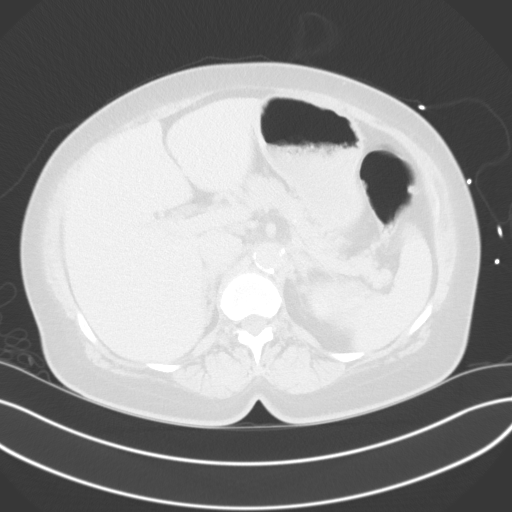
[im 75/88  lung]
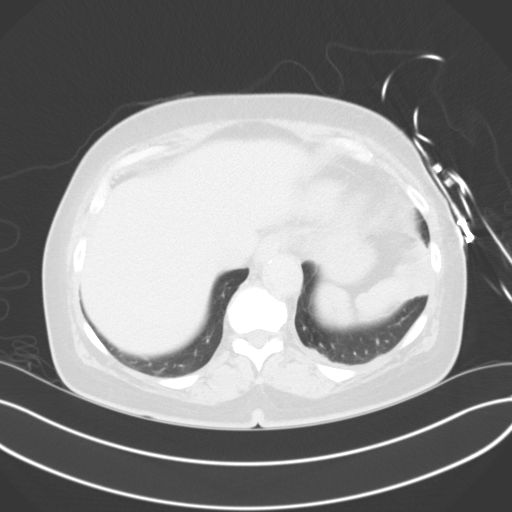

[14 of 37 positions shown; findings below may reference images not displayed]

FINDINGS: CT CHEST FINDINGS

Cardiovascular: Heart is normal in size.  No pericardial effusion.

No evidence of thoracic aortic aneurysm. Atherosclerotic
calcifications of the aortic arch. Proximal left subclavian artery
is occluded at the origin (coronal image 72).

No suspicious mediastinal lymphadenopathy. 14 mm short axis
low-density left infrahilar node (series 2/image 78), suspicious.

Visualized thyroid is unremarkable.

Mediastinum/Nodes: No suspicious mediastinal lymphadenopathy.

Necrotic 14 mm short axis left infrahilar node (series 2/image 78),
suspicious.

Visualized thyroid is unremarkable.

Lungs/Pleura: 6.9 x 5.2 x 6.8 cm centrally necrotic left lower lobe
mass, abutting the pleura/left chest wall, highly suspicious for
primary bronchogenic neoplasm. Mass abuts the left fissure.

Mild biapical pleural-parenchymal scarring.

No focal consolidation.

No pleural effusion or pneumothorax.

Musculoskeletal: Mild degenerative changes of the mid thoracic
spine.

CT ABDOMEN PELVIS FINDINGS

Hepatobiliary: Liver is within normal limits, noting focal
fat/altered perfusion along the falciform ligament (series 15/image
23).

Gallbladder is underdistended. No intrahepatic or extrahepatic
ductal dilatation.

Pancreas: Within normal limits.

Spleen: Cystic lesions in the spleen measuring up to 2.2 cm, likely
benign.

Adrenals/Urinary Tract: Adrenal glands are within normal limits.

Multiple hypoenhancing renal lesions, suggesting pyelonephritis,
with possible early renal abscesses measuring up to 3.5 cm in the
left upper kidney (series 15/image 29) and 3.1 cm in the right upper
kidney (series 15/image 35). No hydronephrosis.

Bladder is within normal limits.

Stomach/Bowel: Stomach is within normal limits.

No evidence of bowel obstruction.

Appendix is not discretely visualized.

Vascular/Lymphatic: No evidence of abdominal aortic aneurysm.

Atherosclerotic calcifications of the abdominal aorta and branch
vessels.

No suspicious abdominopelvic lymphadenopathy.

Reproductive: Status post hysterectomy.

No adnexal masses.

Other: No abdominopelvic ascites.

Musculoskeletal: Mild degenerative changes at L5-S1.
IMPRESSION: 6.9 cm centrally necrotic left lower lobe mass, abutting the
pleura/left chest wall and left fissure, suspicious for primary
bronchogenic neoplasm.

Necrotic 14 mm left infrahilar node, suspicious for nodal
metastasis.

Multiple hypoenhancing renal lesion, suggesting pyelonephritis, with
possible early renal abscesses measuring up to 3.5 cm on the left.
Technically metastases or lymphoma could have a similar appearance,
although this is considered unlikely given the clinical symptoms.
Follow-up CT or MRI abdomen with/without contrast is suggested in
3-4 weeks after appropriate antimicrobial therapy to ensure
resolution.

Occlusion of the proximal left subclavian artery at the origin.
